# Patient Record
Sex: Male | Born: 1970 | Race: White | Hispanic: No | Smoking: Current every day smoker
Health system: Southern US, Community
[De-identification: ages and names within clinical notes are randomized; demographics above are authoritative.]

## PROBLEM LIST (undated history)

## (undated) DIAGNOSIS — G629 Polyneuropathy, unspecified: Secondary | ICD-10-CM

## (undated) DIAGNOSIS — I471 Supraventricular tachycardia: Secondary | ICD-10-CM

## (undated) DIAGNOSIS — R569 Unspecified convulsions: Secondary | ICD-10-CM

## (undated) DIAGNOSIS — E119 Type 2 diabetes mellitus without complications: Secondary | ICD-10-CM

## (undated) DIAGNOSIS — E7800 Pure hypercholesterolemia, unspecified: Secondary | ICD-10-CM

## (undated) DIAGNOSIS — J45909 Unspecified asthma, uncomplicated: Secondary | ICD-10-CM

## (undated) DIAGNOSIS — F329 Major depressive disorder, single episode, unspecified: Secondary | ICD-10-CM

## (undated) DIAGNOSIS — H547 Unspecified visual loss: Secondary | ICD-10-CM

## (undated) DIAGNOSIS — I639 Cerebral infarction, unspecified: Secondary | ICD-10-CM

## (undated) DIAGNOSIS — F419 Anxiety disorder, unspecified: Secondary | ICD-10-CM

## (undated) DIAGNOSIS — F32A Depression, unspecified: Secondary | ICD-10-CM

## (undated) DIAGNOSIS — H5462 Unqualified visual loss, left eye, normal vision right eye: Secondary | ICD-10-CM

## (undated) HISTORY — PX: EYE SURGERY: SHX253

## (undated) HISTORY — PX: ABDOMINAL SURGERY: SHX537

## (undated) HISTORY — PX: HERNIA REPAIR: SHX51

---

## 2004-11-26 ENCOUNTER — Ambulatory Visit: Payer: Self-pay | Admitting: Family Medicine

## 2014-01-23 ENCOUNTER — Ambulatory Visit: Payer: Self-pay | Admitting: Ophthalmology

## 2014-01-25 ENCOUNTER — Encounter (HOSPITAL_COMMUNITY): Payer: Self-pay | Admitting: Emergency Medicine

## 2014-01-25 ENCOUNTER — Emergency Department (HOSPITAL_COMMUNITY): Payer: Medicaid Other

## 2014-01-25 ENCOUNTER — Observation Stay (HOSPITAL_COMMUNITY): Payer: Medicaid Other

## 2014-01-25 ENCOUNTER — Observation Stay (HOSPITAL_COMMUNITY)
Admission: EM | Admit: 2014-01-25 | Discharge: 2014-01-27 | Disposition: A | Discharge status: Home / Self Care | Payer: Medicaid Other | Attending: Dermatology | Admitting: Dermatology

## 2014-01-25 DIAGNOSIS — S4980XA Other specified injuries of shoulder and upper arm, unspecified arm, initial encounter: Secondary | ICD-10-CM | POA: Diagnosis not present

## 2014-01-25 DIAGNOSIS — S066X9A Traumatic subarachnoid hemorrhage with loss of consciousness of unspecified duration, initial encounter: Secondary | ICD-10-CM | POA: Diagnosis not present

## 2014-01-25 DIAGNOSIS — F411 Generalized anxiety disorder: Secondary | ICD-10-CM | POA: Insufficient documentation

## 2014-01-25 DIAGNOSIS — Z880 Allergy status to penicillin: Secondary | ICD-10-CM | POA: Diagnosis not present

## 2014-01-25 DIAGNOSIS — S066XAA Traumatic subarachnoid hemorrhage with loss of consciousness status unknown, initial encounter: Secondary | ICD-10-CM | POA: Diagnosis not present

## 2014-01-25 DIAGNOSIS — E7800 Pure hypercholesterolemia, unspecified: Secondary | ICD-10-CM | POA: Diagnosis not present

## 2014-01-25 DIAGNOSIS — S46909A Unspecified injury of unspecified muscle, fascia and tendon at shoulder and upper arm level, unspecified arm, initial encounter: Secondary | ICD-10-CM | POA: Insufficient documentation

## 2014-01-25 DIAGNOSIS — G589 Mononeuropathy, unspecified: Secondary | ICD-10-CM | POA: Insufficient documentation

## 2014-01-25 DIAGNOSIS — R55 Syncope and collapse: Secondary | ICD-10-CM | POA: Diagnosis not present

## 2014-01-25 DIAGNOSIS — Z23 Encounter for immunization: Secondary | ICD-10-CM | POA: Insufficient documentation

## 2014-01-25 DIAGNOSIS — F3289 Other specified depressive episodes: Secondary | ICD-10-CM | POA: Insufficient documentation

## 2014-01-25 DIAGNOSIS — S79919A Unspecified injury of unspecified hip, initial encounter: Principal | ICD-10-CM | POA: Insufficient documentation

## 2014-01-25 DIAGNOSIS — Y939 Activity, unspecified: Secondary | ICD-10-CM | POA: Insufficient documentation

## 2014-01-25 DIAGNOSIS — H547 Unspecified visual loss: Secondary | ICD-10-CM | POA: Insufficient documentation

## 2014-01-25 DIAGNOSIS — E119 Type 2 diabetes mellitus without complications: Secondary | ICD-10-CM | POA: Insufficient documentation

## 2014-01-25 DIAGNOSIS — E1165 Type 2 diabetes mellitus with hyperglycemia: Secondary | ICD-10-CM

## 2014-01-25 DIAGNOSIS — M25551 Pain in right hip: Secondary | ICD-10-CM

## 2014-01-25 DIAGNOSIS — S79929A Unspecified injury of unspecified thigh, initial encounter: Principal | ICD-10-CM

## 2014-01-25 DIAGNOSIS — Z79899 Other long term (current) drug therapy: Secondary | ICD-10-CM | POA: Diagnosis not present

## 2014-01-25 DIAGNOSIS — J45909 Unspecified asthma, uncomplicated: Secondary | ICD-10-CM | POA: Insufficient documentation

## 2014-01-25 DIAGNOSIS — Y92009 Unspecified place in unspecified non-institutional (private) residence as the place of occurrence of the external cause: Secondary | ICD-10-CM | POA: Diagnosis not present

## 2014-01-25 DIAGNOSIS — H53139 Sudden visual loss, unspecified eye: Secondary | ICD-10-CM

## 2014-01-25 DIAGNOSIS — S199XXA Unspecified injury of neck, initial encounter: Secondary | ICD-10-CM

## 2014-01-25 DIAGNOSIS — F17200 Nicotine dependence, unspecified, uncomplicated: Secondary | ICD-10-CM | POA: Insufficient documentation

## 2014-01-25 DIAGNOSIS — R296 Repeated falls: Secondary | ICD-10-CM | POA: Diagnosis not present

## 2014-01-25 DIAGNOSIS — S0993XA Unspecified injury of face, initial encounter: Secondary | ICD-10-CM | POA: Insufficient documentation

## 2014-01-25 DIAGNOSIS — IMO0001 Reserved for inherently not codable concepts without codable children: Secondary | ICD-10-CM | POA: Diagnosis present

## 2014-01-25 DIAGNOSIS — F329 Major depressive disorder, single episode, unspecified: Secondary | ICD-10-CM | POA: Insufficient documentation

## 2014-01-25 DIAGNOSIS — I609 Nontraumatic subarachnoid hemorrhage, unspecified: Secondary | ICD-10-CM | POA: Diagnosis present

## 2014-01-25 DIAGNOSIS — M25559 Pain in unspecified hip: Secondary | ICD-10-CM | POA: Diagnosis present

## 2014-01-25 HISTORY — DX: Major depressive disorder, single episode, unspecified: F32.9

## 2014-01-25 HISTORY — DX: Depression, unspecified: F32.A

## 2014-01-25 HISTORY — DX: Unspecified asthma, uncomplicated: J45.909

## 2014-01-25 HISTORY — DX: Type 2 diabetes mellitus without complications: E11.9

## 2014-01-25 HISTORY — DX: Polyneuropathy, unspecified: G62.9

## 2014-01-25 HISTORY — DX: Unqualified visual loss, left eye, normal vision right eye: H54.62

## 2014-01-25 HISTORY — DX: Pure hypercholesterolemia, unspecified: E78.00

## 2014-01-25 HISTORY — DX: Anxiety disorder, unspecified: F41.9

## 2014-01-25 LAB — CBC WITH DIFFERENTIAL/PLATELET
Basophils Absolute: 0 10*3/uL (ref 0.0–0.1)
Basophils Relative: 0 % (ref 0–1)
Eosinophils Absolute: 0.3 10*3/uL (ref 0.0–0.7)
Eosinophils Relative: 4 % (ref 0–5)
HEMATOCRIT: 44.5 % (ref 39.0–52.0)
HEMOGLOBIN: 15.7 g/dL (ref 13.0–17.0)
LYMPHS ABS: 1.7 10*3/uL (ref 0.7–4.0)
LYMPHS PCT: 25 % (ref 12–46)
MCH: 31.8 pg (ref 26.0–34.0)
MCHC: 35.3 g/dL (ref 30.0–36.0)
MCV: 90.1 fL (ref 78.0–100.0)
MONOS PCT: 6 % (ref 3–12)
Monocytes Absolute: 0.4 10*3/uL (ref 0.1–1.0)
NEUTROS ABS: 4.4 10*3/uL (ref 1.7–7.7)
Neutrophils Relative %: 65 % (ref 43–77)
Platelets: 122 10*3/uL — ABNORMAL LOW (ref 150–400)
RBC: 4.94 MIL/uL (ref 4.22–5.81)
RDW: 13.1 % (ref 11.5–15.5)
WBC: 6.8 10*3/uL (ref 4.0–10.5)

## 2014-01-25 LAB — I-STAT TROPONIN, ED: Troponin i, poc: 0 ng/mL (ref 0.00–0.08)

## 2014-01-25 LAB — BASIC METABOLIC PANEL
ANION GAP: 16 — AB (ref 5–15)
BUN: 5 mg/dL — AB (ref 6–23)
CALCIUM: 9 mg/dL (ref 8.4–10.5)
CHLORIDE: 101 meq/L (ref 96–112)
CO2: 24 meq/L (ref 19–32)
Creatinine, Ser: 0.51 mg/dL (ref 0.50–1.35)
GFR calc Af Amer: >90 mL/min (ref >90)
GFR calc non Af Amer: >90 mL/min (ref >90)
GLUCOSE: 164 mg/dL — AB (ref 70–99)
Potassium: 4.2 mEq/L (ref 3.7–5.3)
Sodium: 141 mEq/L (ref 137–147)

## 2014-01-25 LAB — GLUCOSE, CAPILLARY: Glucose-Capillary: 168 mg/dL — ABNORMAL HIGH (ref 70–99)

## 2014-01-25 MED ORDER — ACETAMINOPHEN 325 MG PO TABS: 650.0000 mg | ORAL_TABLET | Freq: Four times a day (QID) | ORAL | Status: DC | PRN

## 2014-01-25 MED ORDER — LISINOPRIL 2.5 MG PO TABS
2.5000 mg | ORAL_TABLET | Freq: Every day | ORAL | Status: DC
  Administered 2014-01-25 – 2014-01-27 (×3): 2.5 mg via ORAL
  Filled 2014-01-25 (×3): qty 1

## 2014-01-25 MED ORDER — FLUOXETINE HCL 20 MG PO CAPS
20.0000 mg | ORAL_CAPSULE | Freq: Every day | ORAL | Status: DC
  Administered 2014-01-25 – 2014-01-27 (×3): 20 mg via ORAL
  Filled 2014-01-25 (×3): qty 1

## 2014-01-25 MED ORDER — ATORVASTATIN CALCIUM 40 MG PO TABS
40.0000 mg | ORAL_TABLET | Freq: Every day | ORAL | Status: DC
  Administered 2014-01-25 – 2014-01-27 (×3): 40 mg via ORAL
  Filled 2014-01-25 (×3): qty 1

## 2014-01-25 MED ORDER — SODIUM CHLORIDE 0.9 % IJ SOLN
3.0000 mL | Freq: Two times a day (BID) | INTRAMUSCULAR | Status: DC
  Administered 2014-01-25: 3 mL via INTRAVENOUS

## 2014-01-25 MED ORDER — SODIUM CHLORIDE 0.9 % IJ SOLN: 3.0000 mL | INTRAMUSCULAR | Status: DC | PRN

## 2014-01-25 MED ORDER — MORPHINE SULFATE 2 MG/ML IJ SOLN
INTRAMUSCULAR | Status: AC
  Filled 2014-01-25: qty 1

## 2014-01-25 MED ORDER — ONDANSETRON HCL 4 MG/2ML IJ SOLN: 4.0000 mg | Freq: Four times a day (QID) | INTRAMUSCULAR | Status: DC | PRN

## 2014-01-25 MED ORDER — MORPHINE SULFATE 4 MG/ML IJ SOLN
4.0000 mg | Freq: Once | INTRAMUSCULAR | Status: AC
  Administered 2014-01-25: 4 mg via INTRAVENOUS
  Filled 2014-01-25: qty 1

## 2014-01-25 MED ORDER — INSULIN ASPART 100 UNIT/ML ~~LOC~~ SOLN
0.0000 [IU] | Freq: Three times a day (TID) | SUBCUTANEOUS | Status: DC
  Administered 2014-01-26: 1 [IU] via SUBCUTANEOUS
  Administered 2014-01-26 – 2014-01-27 (×3): 2 [IU] via SUBCUTANEOUS

## 2014-01-25 MED ORDER — SODIUM CHLORIDE 0.9 % IV SOLN: 250.0000 mL | INTRAVENOUS | Status: DC | PRN

## 2014-01-25 MED ORDER — ACETAMINOPHEN 650 MG RE SUPP: 650.0000 mg | Freq: Four times a day (QID) | RECTAL | Status: DC | PRN

## 2014-01-25 MED ORDER — HEPARIN SODIUM (PORCINE) 5000 UNIT/ML IJ SOLN: 5000.0000 [IU] | Freq: Three times a day (TID) | INTRAMUSCULAR | Status: DC

## 2014-01-25 MED ORDER — PREGABALIN 75 MG PO CAPS
150.0000 mg | ORAL_CAPSULE | Freq: Two times a day (BID) | ORAL | Status: DC
  Administered 2014-01-25 – 2014-01-27 (×4): 150 mg via ORAL
  Filled 2014-01-25 (×4): qty 2

## 2014-01-25 MED ORDER — PNEUMOCOCCAL VAC POLYVALENT 25 MCG/0.5ML IJ INJ
0.5000 mL | INJECTION | INTRAMUSCULAR | Status: AC
  Administered 2014-01-26: 0.5 mL via INTRAMUSCULAR
  Filled 2014-01-25: qty 0.5

## 2014-01-25 MED ORDER — MORPHINE SULFATE 2 MG/ML IJ SOLN
2.0000 mg | Freq: Once | INTRAMUSCULAR | Status: AC
  Administered 2014-01-25: 2 mg via INTRAVENOUS

## 2014-01-25 MED ORDER — ONDANSETRON HCL 4 MG PO TABS: 4.0000 mg | ORAL_TABLET | Freq: Four times a day (QID) | ORAL | Status: DC | PRN

## 2014-01-25 NOTE — ED Provider Notes (Signed)
Medical screening examination/treatment/procedure(s) were conducted as a shared visit with non-physician practitioner(s) and myself.  I personally evaluated the patient during the encounter.   EKG Interpretation   Date/Time:  Friday January 25 2014 15:55:04 EDT Ventricular Rate:  83 PR Interval:  119 QRS Duration: 83 QT Interval:  374 QTC Calculation: 439 R Axis:   33 Text Interpretation:  Sinus rhythm Borderline short PR interval Borderline  T abnormalities, inferior leads No previous tracing Confirmed by Denton LankSTEINL   MD, Caryn BeeKEVIN ([id number]) on 01/25/2014 4:05:46 PM      Pt w syncopal episode at home today.  Also states change in vision/loos of vision in eye for the past few days, constant, no change day - states was seen at outside facility for same earlier in week. Monitor. Labs. Imaging. Spine nt.   Suzi RootsKevin E Gaylene Moylan, MD 01/25/14 2052

## 2014-01-25 NOTE — H&P (Signed)
Family Medicine Teaching [facility] Admission History and Physical Service Pager: [phone]  Patient name: Miguel Diaz. Medical record number: [id number] Date of birth: [birth year] Age: 43 y.o. Gender: male  Primary Care Provider: PROVIDER NOT IN SYSTEM Consultants: None Code Status: Full (states would not want to be on ventilator/intubated more than 1 day)  Chief Complaint: Fall   Assessment and Plan: Miguel Diaz. is a 43 y.o. male presenting with a syncope like episode . PMH is significant for diabetes, peripheral neuropathy, asthma, anxiety, depression, vision loss (left), and hypercholesterolemia  Syncope: CT concerning for a small cerebellar hemorrhage vs subarachnoid hemorrhage. No masses or evidence of ICP noted.  No new focal deficits noted on exam. Patient also having acute right vision loss in past week, it is in the process of being worked up as an outpatient with ophthalmology, with [name] done 7/22 and planned f/u early next week.  No new medications recently.. The patient denies any chest pain or SOB and doesn't really have a cardiac history. The lack of prodromal symptoms is concerning for an arrhthymia, though EKG showed only some nonspecific TWI inferior leads. Patient had just walked out of the bathroom after urinating, possible vaso-vagal syncope, though this would be delayed. ED workup largely negative, with normal CBC, Bmet, i-stat troponin - Admit to telemetry, attending Dr. Gwendolyn Grant - Spoke to Dr. Amada Jupiter, neurology, concerning CT findings. He reviewed the CT and [name] from 2 days ago with radiology. Recommended obtaining a non-contrast [name] to evaluate for hemorrhage: if hemorrhage present call him so he may see the patient, also need to consider neurosurgery consult. - [name] brain pending - Attempt to obtain records from the ophthalmologist: if there was an retinal artery occlusion then consult Dr. Amada Jupiter. Given it is the weekend this may not be  possible. - Repeat EKG, Bmet, CBC in the AM - F/u orthostatic vitals - No evidence of fractures on imaging. Tylenol PRN for pain.  Diabetes mellitus, type 2: No hemoglobin A1c on file.   - Will hold home metformin and glipizide - F/u Hb A1c tomorrow - Sensitive SSI with CBGs  - Lyrica for neuropathy - continue low dose lisinopril for renal protection  Hypercholesterolemia: No lipid panel on file - Continue home statin  Anxiety/Depression: Stable - Continue home prozac   FEN/GI: Carb-modified/ NS IV lock Prophylaxis: SCDs, no pharmacologics in case pt has acute hemorrhage   Disposition: Admit to telemetry. Dispo pending [name] results  History of Present Illness: Miguel Diaz. is a 43 y.o. male presenting with a syncope episode   Was at home today and fell while in the bathroom after urinating. He does not remember exactly what happened to cause him to fall, he thinks he remembers trying to catch himself but hit the wall or floor after trying to leave the bathroom. He does not remember tripping on anything. He lost consciousness for an unknown amount of time. He fell on his right side, hurting his right hip and neck (rates as 5/10). Denies hitting his head but difficult to remember. When he woke up he called his girlfriend on the phone (girlfriend states at 1:45pm) for help who called his nephew.  When nephew arrived in the bathroom approximately 10 minutes later he appeared startled. He denies loss of bladder/bowel, CP, SOB, headache.  No prodromal symptoms such as dizziness, light-headedness, or diaphoresis.  Oddly, he reports losing his right vision Saturday/Sunday. This was a sudden onset while at work. This is in  the process of being worked up by PCP with an [name] 2 days ago 7/22, and also followed by Opthalmology with recent visits this past week and follow up to discuss results early next week.  In the right eye he can only see "real faint gray shadows". He lost vision in his left  eye in 2013 thought to be due to hyperglycemia or diabetes complications, but patient and girlfriend are not exactly sure. He only takes PO diabetic medications currently b/c he cannot afford insulins- his PCP at [facility] is helping him to get insurance.   Review Of Systems:  Otherwise 12 point review of systems was performed and was unremarkable.  Patient Active Problem List   Diagnosis Date Noted  . Syncope 01/25/2014  . Hip pain 01/25/2014  . SAH (subarachnoid hemorrhage) 01/25/2014  . Vision, loss, sudden 01/25/2014  . Type II or unspecified type diabetes mellitus without mention of complication, uncontrolled 01/25/2014   Past Medical History: Past Medical History  Diagnosis Date  . Diabetes mellitus without complication   . Neuropathy   . Asthma   . Hypercholesteremia   . Vision loss of left eye   . Depression   . Anxiety    Past Surgical History: Past Surgical History  Procedure Laterality Date  . Hernia repair    . Eye surgery     Social History: History  Substance Use Topics  . Smoking status: Current Every Day Smoker -- 1.00 packs/day    Types: Cigarettes  . Smokeless tobacco: Not on file  . Alcohol Use: Yes     Comment: occ  Last alcohol use in May  Additional social history: lives at home with girlfriend and 19yo nephew  Please also refer to relevant sections of EMR.  Family History: History reviewed. No pertinent family history. Allergies and Medications: Allergies  Allergen Reactions  . Penicillins Other (See Comments)    Childhood allergy  . Toradol [Ketorolac Tromethamine] Hives   No current facility-administered medications on file prior to encounter.   No current outpatient prescriptions on file prior to encounter.    Objective: BP 142/95  Pulse 81  Temp(Src) 98.8 F (37.1 C) (Oral)  Resp 12  Ht 5\' 8"  (1.727 m)  Wt 190 lb (86.183 kg)  BMI 28.90 kg/m2  SpO2 97% Exam: General: Uncomfortable 43 y/o male lying supine  in bed lying on an open aspen cervical collar HEENT: Head atruamatic, normocephalic. PERRL, EOMI. Oropharynx clear, poor dentition. Dry mucous membranes. Tender to palpation all of c-spine, pt unwilling to move head to evaluate ROM Cardiovascular: Regular rate and rhythm, normal heart sounds, no murmur appreciated. 2+ radial and DP pulses bilaterally. Respiratory: CTAB over the anterior chest wall.  Abdomen: +BS, soft, ND/NT Extremities: No gross deformities. Right shoulder, and hip tender to palpation without ecchymosis. 5/5 hand grip b/l.  4+/5 hip flexor on the left, 4/5 on the right.  Skin: No rashes noted Neuro: Speech clear and coherent. A&O x4. Symmetric facial movements with smile and brow movement. Sensation intact and symmetric over the face.   Labs and Imaging: CBC BMET   Recent Labs Lab 01/25/14 1640  WBC 6.8  HGB 15.7  HCT 44.5  PLT 122*    Recent Labs Lab 01/25/14 1640  NA 141  K 4.2  CL 101  CO2 24  BUN 5*  CREATININE 0.51  GLUCOSE 164*  CALCIUM 9.0     CT cervical spine:  1. Punctate 3 mm focus of increased is seen  in the right posterior  fossa. This could be a tiny subarachnoid hemorrhage or right  cerebellar hemorrhage.  2. Right frontal sinusitis. Multiple mucous retention cyst noted  throughout the paranasal sinuses.  3. No evidence of cervical spine fracture or dislocation.  CT Head: 1. Punctate 3 mm focus of increased is seen in the right posterior  fossa. This could be a tiny subarachnoid hemorrhage or right  cerebellar hemorrhage.  2. Right frontal sinusitis. Multiple mucous retention cyst noted  throughout the paranasal sinuses.  3. No evidence of cervical spine fracture or dislocation.  CXR: Mild chronic bronchitic type lung changes but no acute pulmonary findings.  Right hip x-ray: No acute bony findings.  Right shoulder x-ray: No fracture or dislocation  EKG: sinus rhythm, TWI noted in III and aVF; no prior to compare to  Joanna Puffrystal S  Dorsey, MD 01/25/2014, 6:09 PM PGY-1, [facility] FPTS Intern pager: [phone], text pages welcome  I have reviewed the note above and agree. My additions are highlighted in blue.  Tawni CarnesAndrew Maily Debarge, MD 01/25/2014, 9:15 PM PGY-2, [facility] FPTS Intern Pager: [phone], text pages welcome

## 2014-01-25 NOTE — ED Notes (Signed)
[facility] for fall. Pt states the last thing he remembered he was in the bathroom and the next thing he knew he was laying on the hallway floor.  He called family with cell phone in his pocket.  His nephew picked him up and sat pt on couch.  Sat night pt lost vision in R eye (Hx of same in Jan with resolution of vision).  RN stated pt should wait till Monday to see MD.  Pt rcvd [name] on Wed and family has follow up appt on Wed.  Pt ao x 4. Pt c/o pain to entire R side.  CBG in 200's per EMS.

## 2014-01-25 NOTE — ED Notes (Signed)
Pt here for fall, was uses a cane at home and unsure of what caused him to fall, pt on Saturday lost vision in right eye and has hx of no vision in left eye since 2013, pt complains of neck and back pain.

## 2014-01-25 NOTE — ED Provider Notes (Signed)
CSN: [id number]     Arrival date & time 01/25/14  1517 History   First MD Initiated Contact with Patient 01/25/14 1544     Chief Complaint  Patient presents with  . Fall     (Consider location/radiation/quality/duration/timing/severity/associated sxs/prior Treatment) HPI Comments:  Patient presents to the emergency department on a backboard and c-collar with chief complaint of fall/syncopal episode. Reportedly, the patient passed out or fell in his bathroom. He does not know the cause of the fall. States that when he awoke, he had a cell phone in his pocket a common cause family members, who set him up from a couch on call EMS. Patient is complaining of pain in his right hip. He is also complaining of pain in his right shoulder and side. He denies any neck pain, or back pain. He is able to move all extremities.  Additionally, patient informs me that he has intermittent visual loss. Complains of losing vision in his right eye on Saturday. He has been seen by his primary care doctor, as well as ophthalmology, and has had a recent normal [name]. Patient has a followup appointment on Wednesday. Patient has had problems like this before, and his vision is always return. Additionally, he complains of diabetic neuropathy. He denies any other motor or sensory deficits.  The history is provided by the patient. No language interpreter was used.    Past Medical History  Diagnosis Date  . Diabetes mellitus without complication   . Neuropathy   . Asthma   . Hypercholesteremia   . Vision loss of left eye   . Depression   . Anxiety    Past Surgical History  Procedure Laterality Date  . Hernia repair    . Eye surgery     History reviewed. No pertinent family history. History  Substance Use Topics  . Smoking status: Current Every Day Smoker -- 1.00 packs/day    Types: Cigarettes  . Smokeless tobacco: Not on file  . Alcohol Use: Yes     Comment: occ    Review of Systems  All other systems  reviewed and are negative.     Allergies  Penicillins and Toradol  Home Medications   Prior to Admission medications   Medication Sig Start Date End Date Taking? Authorizing Provider  atorvastatin (LIPITOR) 40 MG tablet Take 40 mg by mouth daily.   Yes Historical Provider, MD  FLUoxetine (PROZAC) 20 MG capsule Take 20 mg by mouth daily.   Yes Historical Provider, MD  glipiZIDE (GLUCOTROL XL) 10 MG 24 hr tablet Take 10 mg by mouth daily with breakfast.   Yes Historical Provider, MD  ibuprofen (ADVIL,MOTRIN) 600 MG tablet Take 600 mg by mouth every 8 (eight) hours as needed (pain).   Yes Historical Provider, MD  lisinopril (PRINIVIL,ZESTRIL) 2.5 MG tablet Take 2.5 mg by mouth daily.   Yes Historical Provider, MD  metFORMIN (GLUMETZA) 500 MG (MOD) 24 hr tablet Take 1,000 mg by mouth 2 (two) times daily.   Yes Historical Provider, MD  pregabalin (LYRICA) 150 MG capsule Take 150 mg by mouth 2 (two) times daily.   Yes Historical Provider, MD   BP 142/95  Pulse 81  Temp(Src) 98.8 F (37.1 C) (Oral)  Resp 12  Ht 5\' 8"  (1.727 m)  Wt 190 lb (86.183 kg)  BMI 28.90 kg/m2  SpO2 97% Physical Exam  Nursing note and vitals reviewed. Constitutional: He is oriented to person, place, and time. He appears well-developed and well-nourished.  HENT:  Head:  Normocephalic and atraumatic.  No obvious sign of head injury, no hematoma, no raccoon's eyes, no battle sign  Oropharynx is clear, poor dentition throughout  Eyes: Conjunctivae and EOM are normal. Pupils are equal, round, and reactive to light. Right eye exhibits no discharge. Left eye exhibits no discharge. No scleral icterus.  Neck: Normal range of motion. Neck supple. No JVD present.  Cardiovascular: Normal rate, regular rhythm and normal heart sounds.  Exam reveals no gallop and no friction rub.   No murmur heard. Pulmonary/Chest: Effort normal and breath sounds normal. No respiratory distress. He has no wheezes. He has no rales. He exhibits  no tenderness.  Abdominal: Soft. He exhibits no distension and no mass. There is no tenderness. There is no rebound and no guarding.  No focal abdominal tenderness, no RLQ tenderness or pain at McBurney's point, no RUQ tenderness or Murphy's sign, no left-sided abdominal tenderness, no fluid wave, or signs of peritonitis   Musculoskeletal: Normal range of motion. He exhibits no edema and no tenderness.  Right shoulder tender to palpation, range of motion and strength reduced secondary to pain, no bony abnormality or deformity  Right hip tender to palpation, range of motion and strength reduced secondary to pain, no bony abnormality or deformity  Otherwise, moves all extremities throughout range of motion, no tenderness to palpation of the CTLS spine, no step-offs, no deformities  Neurological: He is alert and oriented to person, place, and time.  Skin: Skin is warm and dry.  Psychiatric: He has a normal mood and affect. His behavior is normal. Judgment and thought content normal.    ED Course  Procedures (including critical care time) Results for orders placed during the hospital encounter of 01/25/14  CBC WITH DIFFERENTIAL      Result Value Ref Range   WBC 6.8  4.0 - 10.5 K/uL   RBC 4.94  4.22 - 5.81 MIL/uL   Hemoglobin 15.7  13.0 - 17.0 g/dL   HCT 16.1  09.6 - 04.5 %   MCV 90.1  78.0 - 100.0 fL   MCH 31.8  26.0 - 34.0 pg   MCHC 35.3  30.0 - 36.0 g/dL   RDW 40.9  81.1 - 91.4 %   Platelets 122 (*) 150 - 400 K/uL   Neutrophils Relative % 65  43 - 77 %   Neutro Abs 4.4  1.7 - 7.7 K/uL   Lymphocytes Relative 25  12 - 46 %   Lymphs Abs 1.7  0.7 - 4.0 K/uL   Monocytes Relative 6  3 - 12 %   Monocytes Absolute 0.4  0.1 - 1.0 K/uL   Eosinophils Relative 4  0 - 5 %   Eosinophils Absolute 0.3  0.0 - 0.7 K/uL   Basophils Relative 0  0 - 1 %   Basophils Absolute 0.0  0.0 - 0.1 K/uL  BASIC METABOLIC PANEL      Result Value Ref Range   Sodium 141  137 - 147 mEq/L   Potassium 4.2  3.7 -  5.3 mEq/L   Chloride 101  96 - 112 mEq/L   CO2 24  19 - 32 mEq/L   Glucose, Bld 164 (*) 70 - 99 mg/dL   BUN 5 (*) 6 - 23 mg/dL   Creatinine, Ser 7.82  0.50 - 1.35 mg/dL   Calcium 9.0  8.4 - 95.6 mg/dL   GFR calc non Af Amer >90  >90 mL/min   GFR calc Af Amer >90  >90 mL/min  Anion gap 16 (*) 5 - 15  I-STAT TROPOININ, ED      Result Value Ref Range   Troponin i, poc 0.00  0.00 - 0.08 ng/mL   Comment 3            Dg Chest 2 View  01/25/2014   CLINICAL DATA:  Larey SeatFell.  Right hip pain.  EXAM: CHEST  2 VIEW  COMPARISON:  12/21/2012.  FINDINGS: The cardiac silhouette, mediastinal and hilar contours are within normal limits and stable. There is mild tortuosity of the thoracic aorta. The lungs demonstrate mild chronic bronchitic changes but no acute overlying pulmonary process. No pleural effusion. The bony thorax is intact. The thoracic vertebral bodies are normally aligned. No acute fracture. The sternum is intact.  IMPRESSION: Mild chronic bronchitic type lung changes but no acute pulmonary findings.  Intact bony thorax.   Electronically Signed   By: Loralie ChampagneMark  Gallerani M.D.   On: 01/25/2014 17:45   Dg Shoulder Right  01/25/2014   CLINICAL DATA:  Larey SeatFell.  Right shoulder pain.  EXAM: RIGHT SHOULDER - 2+ VIEW  COMPARISON:  None.  FINDINGS: The joint spaces are maintained. No acute fracture. No abnormal soft tissue calcifications. The visualized right lung is clear.  IMPRESSION: No fracture or dislocation.   Electronically Signed   By: Loralie ChampagneMark  Gallerani M.D.   On: 01/25/2014 17:45   Dg Hip Complete Right  01/25/2014   CLINICAL DATA:  Larey SeatFell.  Right hip pain.  EXAM: RIGHT HIP - COMPLETE 2+ VIEW  COMPARISON:  None.  FINDINGS: The hips are normally located. Minimal degenerative changes. No acute fracture or plain film evidence of avascular necrosis. The pubic symphysis and SI joints are intact. Mild SI joint degenerative changes.  IMPRESSION: No acute bony findings.   Electronically Signed   By: Loralie ChampagneMark  Gallerani M.D.    On: 01/25/2014 17:43   Ct Head Wo Contrast  01/25/2014   CLINICAL DATA:  Fall.  EXAM: CT HEAD WITHOUT CONTRAST  CT CERVICAL SPINE WITHOUT CONTRAST  TECHNIQUE: Multidetector CT imaging of the head and cervical spine was performed following the standard protocol without intravenous contrast. Multiplanar CT image reconstructions of the cervical spine were also generated.  COMPARISON:  [name] 01/23/2014.  CT 03/24/2012.  FINDINGS: CT HEAD FINDINGS  No mass. No hydrocephalus. No hemorrhage. Punctate 3 mm focus of increased density noted in the right posterior fossa. This could be in in the subarachnoid space or the right cerebellar hemisphere. No mass effect. No other foci of hemorrhage noted. No acute bony abnormality identified. Ill-defined fluid collection noted in the right frontal sinus suggesting sinusitis. Multiple mucous retention cysts noted throughout the paranasal sinuses. Tiny left occipital scalp lipoma noted.  CT CERVICAL SPINE FINDINGS  Shotty cervical lymph nodes are noted. Cervical airway is widely patent. Thyroid is unremarkable. Apical pleural parenchymal thickening present most consistent with scarring. No acute fracture or dislocation noted.  IMPRESSION: 1. Punctate 3 mm focus of increased is seen in the right posterior fossa. This could be a tiny subarachnoid hemorrhage or right cerebellar hemorrhage. 2. Right frontal sinusitis. Multiple mucous retention cyst noted throughout the paranasal sinuses. 3. No evidence of cervical spine fracture or dislocation. Critical Value/emergent results were called by telephone at the time of interpretation on 01/25/2014 at 5:19 pm to PA [facility] Makenize Messman , who verbally acknowledged these results.   Electronically Signed   By: Maisie Fushomas  Register   On: 01/25/2014 17:21   Ct Cervical Spine Wo Contrast  01/25/2014  CLINICAL DATA:  Fall.  EXAM: CT HEAD WITHOUT CONTRAST  CT CERVICAL SPINE WITHOUT CONTRAST  TECHNIQUE: Multidetector CT imaging of the head and cervical  spine was performed following the standard protocol without intravenous contrast. Multiplanar CT image reconstructions of the cervical spine were also generated.  COMPARISON:  [name] 01/23/2014.  CT 03/24/2012.  FINDINGS: CT HEAD FINDINGS  No mass. No hydrocephalus. No hemorrhage. Punctate 3 mm focus of increased density noted in the right posterior fossa. This could be in in the subarachnoid space or the right cerebellar hemisphere. No mass effect. No other foci of hemorrhage noted. No acute bony abnormality identified. Ill-defined fluid collection noted in the right frontal sinus suggesting sinusitis. Multiple mucous retention cysts noted throughout the paranasal sinuses. Tiny left occipital scalp lipoma noted.  CT CERVICAL SPINE FINDINGS  Shotty cervical lymph nodes are noted. Cervical airway is widely patent. Thyroid is unremarkable. Apical pleural parenchymal thickening present most consistent with scarring. No acute fracture or dislocation noted.  IMPRESSION: 1. Punctate 3 mm focus of increased is seen in the right posterior fossa. This could be a tiny subarachnoid hemorrhage or right cerebellar hemorrhage. 2. Right frontal sinusitis. Multiple mucous retention cyst noted throughout the paranasal sinuses. 3. No evidence of cervical spine fracture or dislocation. Critical Value/emergent results were called by telephone at the time of interpretation on 01/25/2014 at 5:19 pm to PA [facility] , who verbally acknowledged these results.   Electronically Signed   By: Maisie Fus  Register   On: 01/25/2014 17:21     Imaging Review No results found.   EKG Interpretation   Date/Time:  Friday January 25 2014 15:55:04 EDT Ventricular Rate:  83 PR Interval:  119 QRS Duration: 83 QT Interval:  374 QTC Calculation: 439 R Axis:   33 Text Interpretation:  Sinus rhythm Borderline short PR interval Borderline  T abnormalities, inferior leads No previous tracing Confirmed by Denton Lank   MD, Caryn Bee ([id number]) on 01/25/2014  4:05:46 PM      MDM   Final diagnoses:  Syncope, unspecified syncope type  SAH (subarachnoid hemorrhage)  Hip pain, right    Patient with syncopal episode today and fall.  Complains of neck pain, and hip pain. Will check imaging, and labs. The patient also reports history of right eye vision loss. States that this is intermittent, but currently does not have vision in his right eye. He has been seen by his PCP in [place], as well as by ophthalmology, and has had a negative [name].  I was called by radiology, and inform the patient has a 3 mm punctate subarachnoid hemorrhage vs. cerebral hemorrhage.  Discussed the patient with Dr. Denton Lank.  Will admit to medicine.  SAH warrants CC.  CRITICAL CARE Performed by: Roxy Horseman   Total critical care time: 35  Critical care time was exclusive of separately billable procedures and treating other patients.  Critical care was necessary to treat or prevent imminent or life-threatening deterioration.  Critical care was time spent personally by me on the following activities: development of treatment plan with patient and/or surrogate as well as nursing, discussions with consultants, evaluation of patient's response to treatment, examination of patient, obtaining history from patient or surrogate, ordering and performing treatments and interventions, ordering and review of laboratory studies, ordering and review of radiographic studies, pulse oximetry and re-evaluation of patient's condition.   Discussed the patient with Family Medicine, who will admit this unassigned patient.  C-collar removed by me, but still complaining of midline neck pain  and pain with movement.  Will apply aspen collar, as ligamentous injury is not ruled out.  Roxy Horseman, PA-C 01/25/14 1826

## 2014-01-26 ENCOUNTER — Other Ambulatory Visit: Payer: Self-pay

## 2014-01-26 ENCOUNTER — Observation Stay (HOSPITAL_COMMUNITY): Payer: Medicaid Other

## 2014-01-26 DIAGNOSIS — S79919A Unspecified injury of unspecified hip, initial encounter: Secondary | ICD-10-CM | POA: Diagnosis not present

## 2014-01-26 DIAGNOSIS — E1165 Type 2 diabetes mellitus with hyperglycemia: Secondary | ICD-10-CM

## 2014-01-26 DIAGNOSIS — IMO0001 Reserved for inherently not codable concepts without codable children: Secondary | ICD-10-CM

## 2014-01-26 DIAGNOSIS — S46909A Unspecified injury of unspecified muscle, fascia and tendon at shoulder and upper arm level, unspecified arm, initial encounter: Secondary | ICD-10-CM | POA: Diagnosis not present

## 2014-01-26 DIAGNOSIS — S066XAA Traumatic subarachnoid hemorrhage with loss of consciousness status unknown, initial encounter: Secondary | ICD-10-CM | POA: Diagnosis not present

## 2014-01-26 DIAGNOSIS — H53139 Sudden visual loss, unspecified eye: Secondary | ICD-10-CM

## 2014-01-26 DIAGNOSIS — S066X9A Traumatic subarachnoid hemorrhage with loss of consciousness of unspecified duration, initial encounter: Secondary | ICD-10-CM | POA: Diagnosis not present

## 2014-01-26 DIAGNOSIS — R55 Syncope and collapse: Secondary | ICD-10-CM | POA: Diagnosis not present

## 2014-01-26 DIAGNOSIS — S4980XA Other specified injuries of shoulder and upper arm, unspecified arm, initial encounter: Secondary | ICD-10-CM | POA: Diagnosis not present

## 2014-01-26 LAB — GLUCOSE, CAPILLARY
GLUCOSE-CAPILLARY: 164 mg/dL — AB (ref 70–99)
Glucose-Capillary: 143 mg/dL — ABNORMAL HIGH (ref 70–99)
Glucose-Capillary: 213 mg/dL — ABNORMAL HIGH (ref 70–99)
Glucose-Capillary: 178 mg/dL — ABNORMAL HIGH (ref 70–99)

## 2014-01-26 LAB — HEMOGLOBIN A1C
Hgb A1c MFr Bld: 8.6 % — ABNORMAL HIGH (ref <5.7)
Mean Plasma Glucose: 200 mg/dL — ABNORMAL HIGH (ref <117)

## 2014-01-26 LAB — CBC
HCT: 44.2 % (ref 39.0–52.0)
Hemoglobin: 15.1 g/dL (ref 13.0–17.0)
MCH: 31.5 pg (ref 26.0–34.0)
MCHC: 34.2 g/dL (ref 30.0–36.0)
MCV: 92.3 fL (ref 78.0–100.0)
PLATELETS: DECREASED 10*3/uL (ref 150–400)
RBC: 4.79 MIL/uL (ref 4.22–5.81)
RDW: 13.3 % (ref 11.5–15.5)
WBC: 7.4 10*3/uL (ref 4.0–10.5)

## 2014-01-26 LAB — BASIC METABOLIC PANEL
Anion gap: 11 (ref 5–15)
BUN: 7 mg/dL (ref 6–23)
CALCIUM: 8.8 mg/dL (ref 8.4–10.5)
CO2: 29 mEq/L (ref 19–32)
Chloride: 102 mEq/L (ref 96–112)
Creatinine, Ser: 0.82 mg/dL (ref 0.50–1.35)
Glucose, Bld: 163 mg/dL — ABNORMAL HIGH (ref 70–99)
Potassium: 4 mEq/L (ref 3.7–5.3)
Sodium: 142 mEq/L (ref 137–147)

## 2014-01-26 LAB — TROPONIN I: Troponin I: <0.3 ng/mL (ref <0.30)

## 2014-01-26 MED ORDER — ASPIRIN EC 81 MG PO TBEC
81.0000 mg | DELAYED_RELEASE_TABLET | Freq: Every day | ORAL | Status: DC
  Administered 2014-01-26 – 2014-01-27 (×2): 81 mg via ORAL
  Filled 2014-01-26 (×2): qty 1

## 2014-01-26 NOTE — Care Management Utilization Note (Signed)
UR completed.    Zebulen Simonis Wise Lillias Difrancesco, RN, BSN Phone #[phone]  

## 2014-01-26 NOTE — Progress Notes (Signed)
  Echocardiogram 2D Echocardiogram has been performed.  Leta JunglingCooper, Kendle Erker M 01/26/2014, 2:26 PM

## 2014-01-26 NOTE — Progress Notes (Signed)
Family Medicine Teaching Service Daily Progress Note Intern Pager: [phone]  Patient name: Miguel CoeGeorge L Stiverson Jr. Medical record number: [id number] Date of birth: [birth year] Age: 43 y.o. Gender: male  Primary Care Provider: PROVIDER NOT IN SYSTEM Consultants: Neuro (phone only) Code Status: Full  Pt Overview and Major Events to Date:  7/25- no overnight events  Assessment and Plan: Miguel CoeGeorge L Hallowell Jr. is a 43 y.o. male presenting with a syncope like episode . PMH is significant for diabetes, peripheral neuropathy, asthma, anxiety, depression, vision loss (left), and hypercholesterolemia.  # Syncope: no clear etiology, ruled out SAH/intracranial bleed with [name]. Per history may be postmicturition, low suspicion for cardiac etiology with no history of heart disease/arrhythmia. Reviewed telemetry overnight and no events. Orthostatic vitals mostly normal, does not meet criteria for orthostatic hypotension. - stable for discharge  # Right vision loss: no pathology noted on [name]. Per neuro discussion, would like to see notes from ophthalmologist but office is closed - has f/u with optho on Wednesday  # Diabetes: A1c here 8.6 - SSI  FEN/GI: diet carb mod, saline lock PPx: SCDs  Disposition: discharge today  Subjective:  No events overnight, wanted to take off cervical collar this morning. Reports improved pain but still tender primarily neck and right hip.  Objective: Temp:  [97.7 F (36.5 C)-98.8 F (37.1 C)] 98.2 F (36.8 C) (07/25 0510) Pulse Rate:  [63-85] 66 (07/25 0510) Resp:  [9-18] 18 (07/24 2000) BP: (107-142)/(73-96) 107/73 mmHg (07/25 0510) SpO2:  [94 %-98 %] 98 % (07/25 0510) Weight:  [185 lb 3.2 oz (84.006 kg)-190 lb (86.183 kg)] 185 lb 3.2 oz (84.006 kg) (07/24 1939) Physical Exam: General: NAD HEENT: PERRL, EOMI. Pt states no vision present bilaterally Cardiovascular: RRR, normal heart sounds, no murmurs appreciated. 2+ radial and PT pulses bilaterally Respiratory:  CTAB, normal effort Abdomen: soft, nontender, normal bowel sounds Extremities: no edema or cyanosis Neuro: alert and oriented, CN2-12 normal, strength 5/5 grip bilat, 4/5 ankle extension bilat  Laboratory:  Recent Labs Lab 01/25/14 1640 01/26/14 0316  WBC 6.8 7.4  HGB 15.7 15.1  HCT 44.5 44.2  PLT 122* PLATELET CLUMPS NOTED ON SMEAR, COUNT APPEARS DECREASED    Recent Labs Lab 01/25/14 1640 01/26/14 0316  NA 141 142  K 4.2 4.0  CL 101 102  CO2 24 29  BUN 5* 7  CREATININE 0.51 0.82  CALCIUM 9.0 8.8  GLUCOSE 164* 163*   Imaging/Diagnostic Tests: Dg Chest 2 View 01/25/2014 IMPRESSION: Mild chronic bronchitic type lung changes but no acute pulmonary findings.  Intact bony thorax.   Electronically Signed   By: Loralie ChampagneMark  Gallerani M.D.   On: 01/25/2014 17:45   Dg Shoulder Right 01/25/2014 IMPRESSION: No fracture or dislocation.   Electronically Signed   By: Loralie ChampagneMark  Gallerani M.D.   On: 01/25/2014 17:45   Dg Hip Complete Right 01/25/2014 IMPRESSION: No acute bony findings.   Electronically Signed   By: Loralie ChampagneMark  Gallerani M.D.   On: 01/25/2014 17:43   Ct Head Wo Contrast 01/25/2014 IMPRESSION: 1. Punctate 3 mm focus of increased is seen in the right posterior fossa. This could be a tiny subarachnoid hemorrhage or right cerebellar hemorrhage. 2. Right frontal sinusitis. Multiple mucous retention cyst noted throughout the paranasal sinuses. 3. No evidence of cervical spine fracture or dislocation. Critical Value/emergent results were called by telephone at the time of interpretation on 01/25/2014 at 5:19 pm to PA [facility] , who verbally acknowledged these results.   Electronically Signed   By:  Thomas  Register   On: 01/25/2014 17:21   Ct Cervical Spine Wo Contrast 01/25/2014 IMPRESSION: 1. Punctate 3 mm focus of increased is seen in the right posterior fossa. This could be a tiny subarachnoid hemorrhage or right cerebellar hemorrhage. 2. Right frontal sinusitis. Multiple mucous  retention cyst noted throughout the paranasal sinuses. 3. No evidence of cervical spine fracture or dislocation. Critical Value/emergent results were called by telephone at the time of interpretation on 01/25/2014 at 5:19 pm to PA [facility] , who verbally acknowledged these results.   Electronically Signed   By: Maisie Fus  Register   On: 01/25/2014 17:21   Mr Brain Wo Contrast 01/25/2014 IMPRESSION: 1. No evidence of acute intracranial abnormality. The punctate hyperattenuating focus near the right foramen of Luschka on recent CT also appears to have been present on the 2013 [name], although less conspicuous on the older examination. Therefore, this most likely represents calcification, such as in choroid plexus, rather than acute hemorrhage. 2. Mild-to-moderate chronic small vessel ischemic disease.   Electronically Signed   By: Sebastian Ache   On: 01/25/2014 21:58    Tawni Carnes, MD 01/26/2014, 8:29 AM PGY-2, [facility] Family Medicine FPTS Intern pager: [phone], text pages welcome

## 2014-01-26 NOTE — Discharge Summary (Signed)
Family Medicine Teaching Service [facility] Discharge Summary  Patient name: Miguel Diaz. Medical record number: [id number] Date of birth: 1970/12/09 Age: 43 y.o. Gender: male Date of Admission: 01/25/2014  Date of Discharge: 01/27/2014 Admitting Physician: Tobey Grim, MD  Primary Care Provider: PROVIDER NOT IN SYSTEM Consultants: Cardiology, neurology (phone only)  Indication for Hospitalization: Syncope  Discharge Diagnoses/Problem List:  Post-micturition syncope Musculoskeletal injury to cervical spine, right shoulder, right hip Diabetes Mellitus, Type 2 Diabetic peripheral neuropathy Permanent left eye vision loss Acute right eye vision loss Hypercholesterolemia Asthma Anxiety, depression  Disposition: home  Discharge Condition: stable, improved  Brief Hospital Course:  [place] Jeff. was admitted for first time episode of syncope after voiding. PMH significant per above. On presentation to the ED he complained of pain in neck, right shoulder and right hip. Initial workup found a normal CBC, Bmet, A1c 8.6, plain films of shoulder and hip. EKG showed TWI in III and aVF, no prior study to compare to, and i-stat troponin was negative. A CT of c-spine showed no fracture, and CT head showed a 3mm punctate lesion in right posterior fossa concerning for subarachnoid hemorrhage or cerebellar hemorrhage. Case was discussed with neuro (phone only) and radiology, recommended [name] Brain to ruleout intracranial bleed. [name] brain showed no bleed, and punctate lesion was smaller but present on CT head done in 2013, felt this was consistent with choroid plexus calcification. He had normal orthostatic vital signs and was monitored on tele overnight with no events, repeat EKG continued to show TWI in III and aVF.  Carotid dopplers, echocardiogram, and myoview non-concerning per cardiology. His pain had improved by day of discharge.   Issues for Follow Up:  1. Right eye vision loss: has  opthalmology f/u Wednesday.  2. Diabetes: A1c 8.6, tolerated insulin while in hospital 3. Consider outpatient event monitor.  Significant Procedures: none  Significant Labs and Imaging:   Recent Labs Lab 01/25/14 1640 01/26/14 0316  WBC 6.8 7.4  HGB 15.7 15.1  HCT 44.5 44.2  PLT 122* PLATELET CLUMPS NOTED ON SMEAR, COUNT APPEARS DECREASED    Recent Labs Lab 01/25/14 1640 01/26/14 0316  NA 141 142  K 4.2 4.0  CL 101 102  CO2 24 29  GLUCOSE 164* 163*  BUN 5* 7  CREATININE 0.51 0.82  CALCIUM 9.0 8.8   Risk Stratification Labs  TSH    Component Value Date/Time   TSH 1.620 01/27/2014 0317   Hemoglobin A1C    Component Value Date/Time   HGBA1C 8.6* 01/25/2014 1640   Lipid Panel     Component Value Date/Time   CHOL 152 01/27/2014 0317   TRIG 213* 01/27/2014 0317   HDL 34* 01/27/2014 0317   CHOLHDL 4.5 01/27/2014 0317   VLDL 43* 01/27/2014 0317   LDLCALC 75 01/27/2014 0317    Dg Chest 2 View: 01/25/2014: Mild chronic bronchitic type lung changes but no acute pulmonary findings.  Intact bony thorax  Dg Shoulder Right: 01/25/2014: No fracture or dislocation.    Dg Hip Complete Right: 01/25/2014: Mild SI joint degenerative changes.No acute bony findings.     Ct Head Wo Contrast: 01/25/2014  1. Punctate 3 mm focus of increased is seen in the right posterior fossa. This could be a tiny subarachnoid hemorrhage or right cerebellar hemorrhage. 2. Right frontal sinusitis. Multiple mucous retention cyst noted throughout the paranasal sinuses. 3. No evidence of cervical spine fracture or dislocation.    Ct Cervical Spine Wo Contrast 01/25/2014: 1. Punctate  3 mm focus of increased is seen in the right posterior fossa. This could be a tiny subarachnoid hemorrhage or right cerebellar hemorrhage. 2. Right frontal sinusitis. Multiple mucous retention cyst noted throughout the paranasal sinuses. 3. No evidence of cervical spine fracture or dislocation.   [name] Brain Wo Contrast: 01/25/2014  1.  No evidence of acute intracranial abnormality. The punctate hyperattenuating focus near the right foramen of Luschka on recent CT also appears to have been present on the 2013 [name], although less conspicuous on the older examination. Therefore, this most likely represents calcification, such as in choroid plexus, rather than acute hemorrhage. 2. Mild-to-moderate chronic small vessel ischemic disease.   01/26/14 Echocardiogram: Upper normal wall thickness with LVEF 60-65%, grade 2 diastolic dysfunction. MAC with trivial mitral regurgitation. PASP 12 mmHg.  Carotid dopplers: BIlateral: mild soft plaque CCA and origin ICA. 1-39% ICA stenosis. Vertebral artery flow is antegrade.  MYOCARDIAL IMAGING WITH SPECT (REST AND PHARMACOLOGIC-STRESS): No definite inducible ischemia with pharmacologic stress. 67% ejection fraction    Results/Tests Pending at Time of Discharge: none  Discharge Medications:    Medication List         atorvastatin 40 MG tablet  Commonly known as:  LIPITOR  Take 40 mg by mouth daily.     FLUoxetine 20 MG capsule  Commonly known as:  PROZAC  Take 20 mg by mouth daily.     glipiZIDE 10 MG 24 hr tablet  Commonly known as:  GLUCOTROL XL  Take 10 mg by mouth daily with breakfast.     Heart Rate Monitor Misc  1 Units by Does not apply route as directed.     ibuprofen 600 MG tablet  Commonly known as:  ADVIL,MOTRIN  Take 600 mg by mouth every 8 (eight) hours as needed (pain).     lisinopril 2.5 MG tablet  Commonly known as:  PRINIVIL,ZESTRIL  Take 2.5 mg by mouth daily.     metFORMIN 500 MG (MOD) 24 hr tablet  Commonly known as:  GLUMETZA  Take 1,000 mg by mouth 2 (two) times daily.     pregabalin 150 MG capsule  Commonly known as:  LYRICA  Take 150 mg by mouth 2 (two) times daily.        Discharge Instructions: Please refer to Patient Instructions section of EMR for full details.  Patient was counseled important signs and symptoms that should prompt  return to medical care, changes in medications, dietary instructions, activity restrictions, and follow up appointments.   Follow-Up Appointments: Follow-up Information   [facility]. Schedule an appointment as soon as possible for a visit in 1 week. (For hospital follow up)    Contact information:   Fax [phone]      Follow up with Candace CruiseSYDNOR,CHARLES F, MD On 01/30/2014. (for opthalmology follow up)    Specialty:  Unknown Physician Specialty   Contact information:   [address]   [place] [address] [phone]       [facility]. (our office will call you to scheduled an appointment for you to pick up a heart monitor)       Joanna Puffrystal S Dorsey, MD 01/28/2014, 9:57 PM PGY-1, [facility]

## 2014-01-26 NOTE — H&P (Addendum)
FMTS Attending Admission Note: Miguel DonJeff Walden MD Personal pager:  [phone] FPTS Service Pager:  [phone]  I  have seen and examined this patient, reviewed their chart. I have discussed this patient with the resident. I agree with the resident's findings, assessment and care plan.  Additionally:  43 yo M with somewhat confusing past medical history including diabetes mellitus type 2 of unknown exact duration but diagnosed in 2013, peripheral neuropathy, HLD, known Left vision loss who presents with syncope.  States micturating yesterday, finished, and walked into hallway.  Next thing he remembers is waking on hallway floor. Called girlfriend who had nephew check on him.  Unclear length of time for LOC.  No palpitations or other prodrome events.    Also reports loss of vision in Right eye last Saturday, acutely.  Has not yet returned.  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   Of note, describes losing vision in Right eye several months ago, returned after 4 days.  Vision in Left eye lost in 2013, also an acute event.  States secondary to complications of diabetes -- yet states before 2013 was "pre-diabetic" range based on A1C at PCP office.  Also reports periods of chest pressure at rest which occurred last week.  Exam: Gen:  Male, lying in bed, NAD HEENT: [place]/AT.  EOMI.  PERRL.  Also with concentric pupillary light reflex.  Unable to visualize any light/contrasting darkness.  Sclera clear without injection.   Neck:  C-spine TTP.  No bruising noted.   Heart:  RRR.  No murmur noted.  Somewhat increased in size to percussion Lungs:  Clear throughout, posterior chest Abd:  Benign Ext:  No trauma noted.  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    Imp/Plan: 1. Syncope: - CXR read as normal, yet cardiac silhouette appears on upper limit of normal if not large - Needs echo for structural  heart evaluation.  Has never had one in past - [name] with calcifications noted.  Initially concern for small bleed - Micturation vaso-vagal event a possibility, but must otherwise rule out cardiogenic syncope.    2.  Loss of vision, acute, without eye pain or redness: - Left eye vision loss appears related to remote infarction from 2013 - Right eye vision concerning for amaurosis fugax vs retinal artery occlusion.   - Obtain carotid dopplers and attempt to obtain outside records from ophtho office.   - Yesterday's [name] negative for any cause of vision loss or syncope -- but does show scattered small vessel disease  3.  Chest pressure: - occurred several times last week,  - had normal nuclear stress test in 2009  - with TWI on inferior leads.  Only 1 troponin that I can see, which was negative - Atypical chest pain -- though I do not trust qualification of chest pain in diabetic with known stroke - needs antiplatelet therapy, statin, TSH, lipid panel - Cards consult for chest pain and syncope evaluation  4. C-spine tenderness: - I do not see any neck films.  Will obtain now  Tobey GrimJeffrey H Walden, MD 01/26/2014 1:40 PM

## 2014-01-26 NOTE — Consult Note (Signed)
CARDIOLOGY CONSULT NOTE   Patient ID: Miguel Diaz. MRN: [id number], DOB/AGE: 41-May-1972   Admit date: 01/25/2014 Date of Consult: 01/26/2014   Primary Physician: PROVIDER NOT IN SYSTEM Primary Cardiologist: None  Pt. Profile  43 year old gentleman admitted with syncope.  Problem List  Past Medical History  Diagnosis Date  . Diabetes mellitus without complication   . Neuropathy   . Asthma   . Hypercholesteremia   . Vision loss of left eye   . Depression   . Anxiety     Past Surgical History  Procedure Laterality Date  . Hernia repair    . Eye surgery       Allergies  Allergies  Allergen Reactions  . Penicillins Other (See Comments)    Childhood allergy  . Toradol [Ketorolac Tromethamine] Hives    HPI   This 43 year old gentleman was admitted after having an episode of syncope at home.  It occurred yesterday afternoon.  He had just finished voiding and flushed the toilet and was in the process of turning back toward the living room when he blacked out.  He did not have any antecedent warning.  It is unclear how long he was unconscious.  He states that he does not have any prior history of syncope.  He does have a past history of diabetes.  He has a history of hypercholesterolemia.  He denies any history of hypertension.  He does state that he experiences occasional precordial chest tightness, not necessarily related to exertion.  He states that about 7 or 8 years ago he was admitted to the hospital in [place] for chest discomfort and had a stress test which was negative.  The patient has not been aware of any racing of his heart or palpitations.  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  Inpatient Medications  . aspirin EC  81 mg Oral Daily  . atorvastatin  40 mg Oral Daily  . FLUoxetine  20 mg Oral Daily  . insulin  aspart  0-9 Units Subcutaneous TID WC  . lisinopril  2.5 mg Oral Daily  . pregabalin  150 mg Oral BID  . sodium chloride  3 mL Intravenous Q12H  . sodium chloride  3 mL Intravenous Q12H    Family History History reviewed.  Patient's father has had coronary artery bypass graft surgery.  Patient's mother died of congestive heart failure.  The patient has a sister who has heart failure and a defibrillator.  Social History History   Social History  . Marital Status: Single    Spouse Name: N/A    Number of Children: N/A  . Years of Education: N/A   Occupational History  . Not on file.   Social History Main Topics  . Smoking status: Current Every Day Smoker -- 1.00 packs/day    Types: Cigarettes  . Smokeless tobacco: Not on file  . Alcohol Use: Yes     Comment: occ  . Drug Use: No  . Sexual Activity: Not on file   Other Topics Concern  . Not on file   Social History Narrative  . No narrative on file     Review of Systems  General:  No chills, fever, night sweats or weight changes.  Cardiovascular:  No chest pain, dyspnea on exertion, edema, orthopnea, palpitations, paroxysmal nocturnal dyspnea. Dermatological: No  rash, lesions/masses Respiratory: No cough, dyspnea Urologic: No hematuria, dysuria Abdominal:   No nausea, vomiting, diarrhea, bright red blood per rectum, melena, or hematemesis Neurologic:  No visual changes, wkns, changes in mental status. All other systems reviewed and are otherwise negative except as noted above.  Physical Exam  Blood pressure 114/73, pulse 97, temperature 98.3 F (36.8 C), temperature source Oral, resp. rate 18, height 5\' 8"  (1.727 m), weight 185 lb 3.2 oz (84.006 kg), SpO2 97.00%.  General: Pleasant, NAD Psych: Normal affect. Neuro: Alert and oriented X 3. Moves all extremities spontaneously. HEENT: Normal  Neck: Supple without bruits or JVD. Lungs:  Resp regular and unlabored, CTA. Heart: RRR no s3, s4, or murmurs. Abdomen:  Soft, non-tender, non-distended, BS + x 4.  Extremities: No clubbing, cyanosis or edema. DP/PT/Radials 2+ and equal bilaterally.  Labs   Recent Labs  01/26/14 1030  TROPONINI <0.30   Lab Results  Component Value Date   WBC 7.4 01/26/2014   HGB 15.1 01/26/2014   HCT 44.2 01/26/2014   MCV 92.3 01/26/2014   PLT PLATELET CLUMPS NOTED ON SMEAR, COUNT APPEARS DECREASED 01/26/2014     Recent Labs Lab 01/26/14 0316  NA 142  K 4.0  CL 102  CO2 29  BUN 7  CREATININE 0.82  CALCIUM 8.8  GLUCOSE 163*   No results found for this basename: CHOL,  HDL,  LDLCALC,  TRIG   No results found for this basename: DDIMER    Radiology/Studies  Dg Chest 2 View  01/25/2014   CLINICAL DATA:  Larey SeatFell.  Right hip pain.  EXAM: CHEST  2 VIEW  COMPARISON:  12/21/2012.  FINDINGS: The cardiac silhouette, mediastinal and hilar contours are within normal limits and stable. There is mild tortuosity of the thoracic aorta. The lungs demonstrate mild chronic bronchitic changes but no acute overlying pulmonary process. No pleural effusion. The bony thorax is intact. The thoracic vertebral bodies are normally aligned. No acute fracture. The sternum is intact.  IMPRESSION: Mild chronic bronchitic type lung changes but no acute pulmonary findings.  Intact bony thorax.   Electronically Signed   By: Loralie ChampagneMark  Gallerani M.D.   On: 01/25/2014 17:45   Dg Cervical Spine Complete  01/26/2014   CLINICAL DATA:  Pain post trauma  EXAM: CERVICAL SPINE  4+ VIEWS  COMPARISON:  None.  FINDINGS: Frontal, lateral, open-mouth odontoid, and bilateral oblique views were obtained. There is no fracture or spondylolisthesis. Prevertebral soft tissues and predental space regions are normal. Disc spaces appear intact. There are small focus calcification in the anterior ligament at C4-5 and C5-6. There is no appreciable exit foraminal narrowing on the oblique views.  IMPRESSION: No fracture or spondylolisthesis. Mild anterior ligament calcification at  C4-5 and C5-6.   Electronically Signed   By: Bretta BangWilliam  Woodruff M.D.   On: 01/26/2014 15:34   Dg Shoulder Right  01/25/2014   CLINICAL DATA:  Larey SeatFell.  Right shoulder pain.  EXAM: RIGHT SHOULDER - 2+ VIEW  COMPARISON:  None.  FINDINGS: The joint spaces are maintained. No acute fracture. No abnormal soft tissue calcifications. The visualized right lung is clear.  IMPRESSION: No fracture or dislocation.   Electronically Signed   By: Loralie ChampagneMark  Gallerani M.D.   On: 01/25/2014 17:45   Dg Hip Complete Right  01/25/2014   CLINICAL DATA:  Larey SeatFell.  Right hip pain.  EXAM: RIGHT HIP - COMPLETE 2+ VIEW  COMPARISON:  None.  FINDINGS: The hips are normally located. Minimal degenerative changes. No acute fracture  or plain film evidence of avascular necrosis. The pubic symphysis and SI joints are intact. Mild SI joint degenerative changes.  IMPRESSION: No acute bony findings.   Electronically Signed   By: Loralie Champagne M.D.   On: 01/25/2014 17:43   Ct Head Wo Contrast  01/25/2014   CLINICAL DATA:  Fall.  EXAM: CT HEAD WITHOUT CONTRAST  CT CERVICAL SPINE WITHOUT CONTRAST  TECHNIQUE: Multidetector CT imaging of the head and cervical spine was performed following the standard protocol without intravenous contrast. Multiplanar CT image reconstructions of the cervical spine were also generated.  COMPARISON:  [name] 01/23/2014.  CT 03/24/2012.  FINDINGS: CT HEAD FINDINGS  No mass. No hydrocephalus. No hemorrhage. Punctate 3 mm focus of increased density noted in the right posterior fossa. This could be in in the subarachnoid space or the right cerebellar hemisphere. No mass effect. No other foci of hemorrhage noted. No acute bony abnormality identified. Ill-defined fluid collection noted in the right frontal sinus suggesting sinusitis. Multiple mucous retention cysts noted throughout the paranasal sinuses. Tiny left occipital scalp lipoma noted.  CT CERVICAL SPINE FINDINGS  Shotty cervical lymph nodes are noted. Cervical airway is widely  patent. Thyroid is unremarkable. Apical pleural parenchymal thickening present most consistent with scarring. No acute fracture or dislocation noted.  IMPRESSION: 1. Punctate 3 mm focus of increased is seen in the right posterior fossa. This could be a tiny subarachnoid hemorrhage or right cerebellar hemorrhage. 2. Right frontal sinusitis. Multiple mucous retention cyst noted throughout the paranasal sinuses. 3. No evidence of cervical spine fracture or dislocation. Critical Value/emergent results were called by telephone at the time of interpretation on 01/25/2014 at 5:19 pm to PA [facility] , who verbally acknowledged these results.   Electronically Signed   By: Maisie Fus  Register   On: 01/25/2014 17:21   Ct Cervical Spine Wo Contrast  01/25/2014   CLINICAL DATA:  Fall.  EXAM: CT HEAD WITHOUT CONTRAST  CT CERVICAL SPINE WITHOUT CONTRAST  TECHNIQUE: Multidetector CT imaging of the head and cervical spine was performed following the standard protocol without intravenous contrast. Multiplanar CT image reconstructions of the cervical spine were also generated.  COMPARISON:  [name] 01/23/2014.  CT 03/24/2012.  FINDINGS: CT HEAD FINDINGS  No mass. No hydrocephalus. No hemorrhage. Punctate 3 mm focus of increased density noted in the right posterior fossa. This could be in in the subarachnoid space or the right cerebellar hemisphere. No mass effect. No other foci of hemorrhage noted. No acute bony abnormality identified. Ill-defined fluid collection noted in the right frontal sinus suggesting sinusitis. Multiple mucous retention cysts noted throughout the paranasal sinuses. Tiny left occipital scalp lipoma noted.  CT CERVICAL SPINE FINDINGS  Shotty cervical lymph nodes are noted. Cervical airway is widely patent. Thyroid is unremarkable. Apical pleural parenchymal thickening present most consistent with scarring. No acute fracture or dislocation noted.  IMPRESSION: 1. Punctate 3 mm focus of increased is seen in the  right posterior fossa. This could be a tiny subarachnoid hemorrhage or right cerebellar hemorrhage. 2. Right frontal sinusitis. Multiple mucous retention cyst noted throughout the paranasal sinuses. 3. No evidence of cervical spine fracture or dislocation. Critical Value/emergent results were called by telephone at the time of interpretation on 01/25/2014 at 5:19 pm to PA [facility] , who verbally acknowledged these results.   Electronically Signed   By: Maisie Fus  Register   On: 01/25/2014 17:21   Mr Brain Wo Contrast  01/25/2014   CLINICAL DATA:  Syncope.  Punctate hemorrhage  on CT.  EXAM: [name] HEAD WITHOUT CONTRAST  TECHNIQUE: Multiplanar, multiecho pulse sequences of the brain and surrounding structures were obtained without intravenous contrast.  COMPARISON:  [name] 01/25/2014 and 03/24/2012. Brain [name] 01/23/2014.  FINDINGS: Right larger than left occipital scalp lipomas are noted. There is no evidence of acute infarct, intracranial hemorrhage, mass, midline shift, or extra-axial fluid collection. Numerous small foci of T2 hyperintensity are present throughout the subcortical and deep cerebral white matter bilaterally, nonspecific but compatible with mild to moderate chronic small vessel ischemic disease, advanced for age. There is mild cerebral atrophy. Remote, small infarct is again noted in the posterior right temporal lobe periventricular white matter.  There is no evidence of hemorrhage or other abnormality near the right foramen of Luschka to correspond with the punctate hyperdensity described on CT. The distal left vertebral artery flow void is small. Other major intracranial vascular flow voids appear preserved. Small bilateral staff alignments are noted. Mucosal thickening is present in the frontal sinuses and ethmoid air cells bilaterally. Mucosal thickening versus mucous retention cysts are noted in the maxillary sinuses, with a mucous retention cyst present in the right sphenoid sinus. Mastoid  air cells are clear.  IMPRESSION: 1. No evidence of acute intracranial abnormality. The punctate hyperattenuating focus near the right foramen of Luschka on recent CT also appears to have been present on the 2013 [name], although less conspicuous on the older examination. Therefore, this most likely represents calcification, such as in choroid plexus, rather than acute hemorrhage. 2. Mild-to-moderate chronic small vessel ischemic disease.   Electronically Signed   By: Sebastian Ache   On: 01/25/2014 21:58    ECG  Normal sinus rhythm T wave abnormality, consider inferior ischemia Abnormal ECG  2-D echo: - Left ventricle: The cavity size was normal. Wall thickness was at the upper limits of normal. Systolic function was normal. The estimated ejection fraction was in the range of 60% to 65%. Wall motion was normal; there were no regional wall motion abnormalities. Features are consistent with a pseudonormal left ventricular filling pattern, with concomitant abnormal relaxation and increased filling pressure (grade 2 diastolic dysfunction). - Aortic valve: Mildly calcified annulus. Trileaflet. There was no significant regurgitation. - Mitral valve: Calcified annulus. There was trivial regurgitation. - Right atrium: Central venous pressure (est): 3 mm Hg. - Atrial septum: No defect or patent foramen ovale was identified. - Tricuspid valve: There was trivial regurgitation. - Pulmonary arteries: PA peak pressure: 12 mm Hg (S). - Pericardium, extracardiac: A prominent pericardial fat pad was present.  Impressions:  - Upper normal wall thickness with LVEF 60-65%, grade 2 diastolic dysfunction. MAC with trivial mitral regurgitation. PASP 12 mmHg.  ASSESSMENT AND PLAN 1.  Syncope of undetermined etiology.  Telemetry here in the hospital has shown only normal sinus rhythm.  Possible micturition syncope although the patient denies having to strain to void. 2. abnormal EKG.  Multiple risk factors  for premature coronary disease including strong family history, diabetes, and hypercholesterolemia. 3. chest pressure uncertain etiology rule out ischemic heart disease  Plan: We will proceed with a Myoview stress test. Consider outpatient event monitor if no cause for syncope found during this hospitalization.  Signed, Cassell Clement, MD  01/26/2014, 7:02 PM

## 2014-01-27 ENCOUNTER — Observation Stay (HOSPITAL_COMMUNITY): Payer: Medicaid Other

## 2014-01-27 ENCOUNTER — Other Ambulatory Visit: Payer: Self-pay

## 2014-01-27 ENCOUNTER — Other Ambulatory Visit: Payer: Self-pay | Admitting: Cardiology

## 2014-01-27 DIAGNOSIS — S4980XA Other specified injuries of shoulder and upper arm, unspecified arm, initial encounter: Secondary | ICD-10-CM | POA: Diagnosis not present

## 2014-01-27 DIAGNOSIS — R55 Syncope and collapse: Secondary | ICD-10-CM | POA: Diagnosis not present

## 2014-01-27 DIAGNOSIS — S066XAA Traumatic subarachnoid hemorrhage with loss of consciousness status unknown, initial encounter: Secondary | ICD-10-CM | POA: Diagnosis not present

## 2014-01-27 DIAGNOSIS — S066X9A Traumatic subarachnoid hemorrhage with loss of consciousness of unspecified duration, initial encounter: Secondary | ICD-10-CM | POA: Diagnosis not present

## 2014-01-27 DIAGNOSIS — S79919A Unspecified injury of unspecified hip, initial encounter: Secondary | ICD-10-CM | POA: Diagnosis not present

## 2014-01-27 DIAGNOSIS — S46909A Unspecified injury of unspecified muscle, fascia and tendon at shoulder and upper arm level, unspecified arm, initial encounter: Secondary | ICD-10-CM | POA: Diagnosis not present

## 2014-01-27 LAB — LIPID PANEL
CHOL/HDL RATIO: 4.5 ratio
CHOLESTEROL: 152 mg/dL (ref 0–200)
HDL: 34 mg/dL — ABNORMAL LOW (ref >39)
LDL CALC: 75 mg/dL (ref 0–99)
Triglycerides: 213 mg/dL — ABNORMAL HIGH (ref <150)
VLDL: 43 mg/dL — AB (ref 0–40)

## 2014-01-27 LAB — GLUCOSE, CAPILLARY
GLUCOSE-CAPILLARY: 212 mg/dL — AB (ref 70–99)
GLUCOSE-CAPILLARY: 190 mg/dL — AB (ref 70–99)
Glucose-Capillary: 177 mg/dL — ABNORMAL HIGH (ref 70–99)

## 2014-01-27 LAB — TSH: TSH: 1.62 u[IU]/mL (ref 0.350–4.500)

## 2014-01-27 MED ORDER — TECHNETIUM TC 99M SESTAMIBI - CARDIOLITE
30.0000 | Freq: Once | INTRAVENOUS | Status: AC | PRN
  Administered 2014-01-27: 30 via INTRAVENOUS

## 2014-01-27 MED ORDER — HEART RATE MONITOR MISC: 1.0000 [IU] | Status: DC

## 2014-01-27 MED ORDER — TECHNETIUM TC 99M SESTAMIBI GENERIC - CARDIOLITE
10.0000 | Freq: Once | INTRAVENOUS | Status: AC | PRN
  Administered 2014-01-27: 10 via INTRAVENOUS

## 2014-01-27 MED ORDER — REGADENOSON 0.4 MG/5ML IV SOLN
INTRAVENOUS | Status: AC
  Administered 2014-01-27: 0.4 mg via INTRAVENOUS
  Filled 2014-01-27: qty 5

## 2014-01-27 MED ORDER — REGADENOSON 0.4 MG/5ML IV SOLN
0.4000 mg | Freq: Once | INTRAVENOUS | Status: AC
  Administered 2014-01-27: 0.4 mg via INTRAVENOUS
  Filled 2014-01-27: qty 5

## 2014-01-27 NOTE — Progress Notes (Signed)
FMTS Attending Daily Note:  Miguel DonJeff Walden MD  [phone] pager  Family Practice pager:  [phone] I have seen and examined this patient and have reviewed their chart. I have discussed this patient with the resident. I agree with the resident's findings, assessment and care plan.  Additionally:  - did well o/n.  No events on tele or cardiogenic sources of syncope - Echo with diastolic dysfunction but no structural cause of syncope - Myoview today - Appreciate cardiology recommendations.   - Agree with event monitor - Needs close FU with both PCP and ophtho with this recent vision loss  ** Late note:  Patient evaluated 11 AM today  Miguel GrimJeffrey H Walden, MD 01/27/2014

## 2014-01-27 NOTE — Progress Notes (Signed)
Patient Profile: 43 y/o male, with h/o HLD and DM admitted for syncope  Subjective: Currently w/o complaints. No further occurrence of syncope. Denies SP/ SOB.   Objective: Vital signs in last 24 hours: Temp:  [97.3 F (36.3 C)-98.3 F (36.8 C)] 97.3 F (36.3 C) (07/26 0500) Pulse Rate:  [65-113] 97 (07/26 1033) Resp:  [12-18] 16 (07/26 0500) BP: (105-124)/(59-80) 111/71 mmHg (07/26 1032) SpO2:  [94 %-97 %] 94 % (07/26 0500) Weight:  [188 lb 12.8 oz (85.639 kg)] 188 lb 12.8 oz (85.639 kg) (07/26 0500) Last BM Date: 01/25/14  Intake/Output from previous day: 07/25 0701 - 07/26 0700 In: 120 [P.O.:120] Out: -  Intake/Output this shift:    Medications Current Facility-Administered Medications  Medication Dose Route Frequency Provider Last Rate Last Dose  . 0.9 %  sodium chloride infusion  250 mL Intravenous PRN Tawni CarnesAndrew Wight, MD      . acetaminophen (TYLENOL) tablet 650 mg  650 mg Oral Q6H PRN Tawni CarnesAndrew Wight, MD       Or  . acetaminophen (TYLENOL) suppository 650 mg  650 mg Rectal Q6H PRN Tawni CarnesAndrew Wight, MD      . aspirin EC tablet 81 mg  81 mg Oral Daily Tobey GrimJeffrey H Walden, MD   81 mg at 01/26/14 1607  . atorvastatin (LIPITOR) tablet 40 mg  40 mg Oral Daily Tawni CarnesAndrew Wight, MD   40 mg at 01/26/14 1053  . FLUoxetine (PROZAC) capsule 20 mg  20 mg Oral Daily Tawni CarnesAndrew Wight, MD   20 mg at 01/26/14 1053  . insulin aspart (novoLOG) injection 0-9 Units  0-9 Units Subcutaneous TID WC Tawni CarnesAndrew Wight, MD   2 Units at 01/26/14 1733  . lisinopril (PRINIVIL,ZESTRIL) tablet 2.5 mg  2.5 mg Oral Daily Tawni CarnesAndrew Wight, MD   2.5 mg at 01/26/14 1052  . ondansetron (ZOFRAN) tablet 4 mg  4 mg Oral Q6H PRN Tawni CarnesAndrew Wight, MD       Or  . ondansetron [facility](ZOFRAN) injection 4 mg  4 mg Intravenous Q6H PRN Tawni CarnesAndrew Wight, MD      . pregabalin (LYRICA) capsule 150 mg  150 mg Oral BID Tawni CarnesAndrew Wight, MD   150 mg at 01/26/14 2216  . sodium chloride 0.9 % injection 3 mL  3 mL Intravenous Q12H Tawni CarnesAndrew Wight, MD   3 mL at 01/25/14 2132   . sodium chloride 0.9 % injection 3 mL  3 mL Intravenous Q12H Tawni CarnesAndrew Wight, MD   3 mL at 01/25/14 2132  . sodium chloride 0.9 % injection 3 mL  3 mL Intravenous PRN Tawni CarnesAndrew Wight, MD        PE: General appearance: alert, cooperative and no distress Lungs: clear to auscultation bilaterally Heart: regular rate and rhythm Extremities: no LEE Pulses: 2+ and symmetric Skin: warm and dry Neurologic: Grossly normal  Lab Results:   Recent Labs  01/25/14 1640 01/26/14 0316  WBC 6.8 7.4  HGB 15.7 15.1  HCT 44.5 44.2  PLT 122* PLATELET CLUMPS NOTED ON SMEAR, COUNT APPEARS DECREASED   BMET  Recent Labs  01/25/14 1640 01/26/14 0316  NA 141 142  K 4.2 4.0  CL 101 102  CO2 24 29  GLUCOSE 164* 163*  BUN 5* 7  CREATININE 0.51 0.82  CALCIUM 9.0 8.8   PT/INR No results found for this basename: LABPROT, INR,  in the last 72 hours Cholesterol  Recent Labs  01/27/14 0317  CHOL 152   Cardiac Enzymes Cardiac Panel (last 3 results)  Recent Labs  01/26/14 1030  TROPONINI <0.30    Studies/Results: 2D echo 01/26/14 Study Conclusions  - Left ventricle: The cavity size was normal. Wall thickness was at the upper limits of normal. Systolic function was normal. The estimated ejection fraction was in the range of 60% to 65%. Wall motion was normal; there were no regional wall motion abnormalities. Features are consistent with a pseudonormal left ventricular filling pattern, with concomitant abnormal relaxation and increased filling pressure (grade 2 diastolic dysfunction). - Aortic valve: Mildly calcified annulus. Trileaflet. There was no significant regurgitation. - Mitral valve: Calcified annulus. There was trivial regurgitation. - Right atrium: Central venous pressure (est): 3 mm Hg. - Atrial septum: No defect or patent foramen ovale was identified. - Tricuspid valve: There was trivial regurgitation. - Pulmonary arteries: PA peak pressure: 12 mm Hg (S). - Pericardium,  extracardiac: A prominent pericardial fat pad was present.  Impressions:  - Upper normal wall thickness with LVEF 60-65%, grade 2 diastolic dysfunction. MAC with trivial mitral regurgitation. PASP 12 mmHg.   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 Syncope: [name] normal. 2D echo with normal LV function. No effusion and no significant valvular abnormalities. + for grade 2 diastolic dsyfunction. Enzymes negative. NST completed. Results pending. If normal, consider an outpatient event monitor.     LOS: 2 days    Brittainy M. Delmer Islam 01/27/2014 11:15 AM  Patient seen and examined and history reviewed. Agree with above findings and plan. No recurrent syncope. No arrhythmia. Echo unremarkable. Awaiting Nuclear stress test results. If normal can arrange outpatient event monitor. Patient does not have a land phone line but does have access to a land line at his parent's house.  Peter [place], MDFACC 01/27/2014 12:04 PM

## 2014-01-27 NOTE — Progress Notes (Signed)
Family Medicine Teaching Service Daily Progress Note Intern Pager: [phone]  Patient name: Miguel CoeGeorge L Pilley Jr. Medical record number: [id number] Date of birth: 1970/12/20 Age: 43 y.o. Gender: male  Primary Care Provider: PROVIDER NOT IN SYSTEM Consultants: Neurology, cardiology Code Status: Full  Pt Overview and Major Events to Date:  7/24: Admitted for syncope evaluation  Assessment and Plan: Miguel CoeGeorge L Shoemaker Jr. is a 43 y.o. male presenting with a syncope like episode . PMH is significant for diabetes, peripheral neuropathy, asthma, anxiety, depression, vision loss (left), and hypercholesterolemia.  Syncope: Uncertain etiology. Head [name] normal, no history of heart disease, not orthostatic on admission. NSR on ECG with consistent T wave flattening on lateral precordial leads. - Consider event monitor as outpatient - Carotid dopplers  Atypical angina: Negative cardiac enzymes, unchanged NSR on ECG. History of negative stress test 7-8 years ago. TSH 1.620. ASCVD 10-year risk: 1.8% (on lipitor 40mg ).  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 No pathology noted on [name].  - Follow up with ophthalmologist as scheduled 7/29  T2DM: Hb A1c 8.6% on metformin 1g BID and glipizide 10mg . Good inpatient control on sensitive SSI - Sensitive SSI  FEN/GI: diet carb mod, saline lock Prophylaxis: SCDs  Disposition: Discharge home likely pending myoview results.  Subjective:  Pt without CP, SOB, palpitations following nuclear medicine study.   Objective: Temp:  [97.3 F (36.3 C)-98.3 F (36.8 C)] 97.3 F (36.3 C) (07/26 0500) Pulse Rate:  [68-97] 68 (07/26 0500) Resp:  [12-18] 16 (07/26 0500) BP: (108-114)/(67-80) 108/71 mmHg (07/26 0500) SpO2:  [94 %-97 %] 94 % (07/26 0500) Weight:  [188 lb 12.8 oz (85.639 kg)] 188 lb 12.8 oz (85.639 kg) (07/26 0500) Physical Exam: General: 43 y.o. male in NAD HEENT: PERRL, EOMI. Pt states no vision present  bilaterally Cardiovascular: RRR, normal heart sounds, no murmurs appreciated. 2+ radial and PT pulses bilaterally Respiratory: CTAB, normal effort Abdomen: soft, nontender, normal bowel sounds Extremities: no edema or cyanosis Neuro: alert and oriented, CN2-12 normal, strength 5/5 grip bilat, 4/5 ankle extension bilat  Laboratory:  Recent Labs Lab 01/25/14 1640 01/26/14 0316  WBC 6.8 7.4  HGB 15.7 15.1  HCT 44.5 44.2  PLT 122* PLATELET CLUMPS NOTED ON SMEAR, COUNT APPEARS DECREASED    Recent Labs Lab 01/25/14 1640 01/26/14 0316  NA 141 142  K 4.2 4.0  CL 101 102  CO2 24 29  BUN 5* 7  CREATININE 0.51 0.82  CALCIUM 9.0 8.8  GLUCOSE 164* 163*   Imaging/Diagnostic Tests: Dg Chest 2 View 01/25/2014 IMPRESSION: Mild chronic bronchitic type lung changes but no acute pulmonary findings.  Intact bony thorax.   Electronically Signed   By: Loralie ChampagneMark  Gallerani M.D.   On: 01/25/2014 17:45   Dg Shoulder Right 01/25/2014 IMPRESSION: No fracture or dislocation.   Electronically Signed   By: Loralie ChampagneMark  Gallerani M.D.   On: 01/25/2014 17:45   Dg Hip Complete Right 01/25/2014 IMPRESSION: No acute bony findings.   Electronically Signed   By: Loralie ChampagneMark  Gallerani M.D.   On: 01/25/2014 17:43   Ct Head Wo Contrast 01/25/2014 IMPRESSION: 1. Punctate 3 mm focus of increased is seen in the right posterior fossa. This could be a tiny subarachnoid hemorrhage or right cerebellar hemorrhage. 2. Right frontal sinusitis. Multiple mucous retention cyst noted throughout the paranasal sinuses. 3. No evidence of cervical spine fracture or dislocation. Critical Value/emergent results were called by telephone at the time of interpretation on 01/25/2014 at 5:19 pm  to PA [facility] , who verbally acknowledged these results.   Electronically Signed   By: Maisie Fus  Register   On: 01/25/2014 17:21   Ct Cervical Spine Wo Contrast 01/25/2014 IMPRESSION: 1. Punctate 3 mm focus of increased is seen in the right posterior fossa.  This could be a tiny subarachnoid hemorrhage or right cerebellar hemorrhage. 2. Right frontal sinusitis. Multiple mucous retention cyst noted throughout the paranasal sinuses. 3. No evidence of cervical spine fracture or dislocation. Critical Value/emergent results were called by telephone at the time of interpretation on 01/25/2014 at 5:19 pm to PA [facility] , who verbally acknowledged these results.   Electronically Signed   By: Maisie Fus  Register   On: 01/25/2014 17:21   Mr Brain Wo Contrast 01/25/2014 IMPRESSION: 1. No evidence of acute intracranial abnormality. The punctate hyperattenuating focus near the right foramen of Luschka on recent CT also appears to have been present on the 2013 [name], although less conspicuous on the older examination. Therefore, this most likely represents calcification, such as in choroid plexus, rather than acute hemorrhage. 2. Mild-to-moderate chronic small vessel ischemic disease.   Electronically Signed   By: Sebastian Ache   On: 01/25/2014 21:58   2-D echo:  - Upper normal wall thickness with LVEF 60-65%, grade 2 diastolic dysfunction. MAC with trivial mitral regurgitation. PASP 12 mmHg.  Myoview:  No definite inducible ischemia with pharmacologic stress.  67% ejection fraction  Hazeline Junker, MD 01/27/2014, 10:13 AM PGY-2, [facility] Family Medicine FPTS Intern pager: [phone], text pages welcome

## 2014-01-27 NOTE — Progress Notes (Signed)
INITIAL NUTRITION ASSESSMENT  DOCUMENTATION CODES Per approved criteria  -unable to assess   INTERVENTION: 1.  Supplements; consider PO supplement based on wt hx and PO intake once diet resumed.   NUTRITION DIAGNOSIS: Inadequate oral intake related to omission of energy dense food as evidenced by NPO.   Monitor:  1.  Food/Beverage; pt meeting >/=90% estimated needs with tolerance. 2.  Wt/wt change; monitor trends  Reason for Assessment: MST  43 y.o. male  Admitting Dx: syncope  ASS33SSMENT: Pt admitted with syncope.  Pt unavailable at time of visit- in nuclear medicine.  Pt with PMHx diabetes and hypercholesterolemia.  Pt has been largely NPO since admission, however did eat 75% of breakfast yesterday.   Pt reported wt loss and poor appetite on admission.   RD to follow for ongoing assessment.   Height: Ht Readings from Last 1 Encounters:  01/25/14 5\' 8"  (1.727 m)    Weight: Wt Readings from Last 1 Encounters:  01/27/14 188 lb 12.8 oz (85.639 kg)    Ideal Body Weight: 70 kg  % Ideal Body Weight: 121%  Wt Readings from Last 10 Encounters:  01/27/14 188 lb 12.8 oz (85.639 kg)    Usual Body Weight: unknown  % Usual Body Weight: unable to assess  BMI:  Body mass index is 28.71 kg/(m^2).  Estimated Nutritional Needs: Kcal: 1900-2100 Protein: 84-100g Fluid: >2.0 L/day  Skin: intact  Diet Order: NPO  EDUCATION NEEDS: -Education needs addressed  No intake or output data in the 24 hours ending 01/27/14 0923  Last BM: 7/24   Labs:   Recent Labs Lab 01/25/14 1640 01/26/14 0316  NA 141 142  K 4.2 4.0  CL 101 102  CO2 24 29  BUN 5* 7  CREATININE 0.51 0.82  CALCIUM 9.0 8.8  GLUCOSE 164* 163*    CBG (last 3)   Recent Labs  01/26/14 1650 01/26/14 2232 01/27/14 0732  GLUCAP 213* 178* 177*    Scheduled Meds: . aspirin EC  81 mg Oral Daily  . atorvastatin  40 mg Oral Daily  . FLUoxetine  20 mg Oral Daily  . insulin aspart  0-9 Units  Subcutaneous TID WC  . lisinopril  2.5 mg Oral Daily  . pregabalin  150 mg Oral BID  . regadenoson      . regadenoson  0.4 mg Intravenous Once  . sodium chloride  3 mL Intravenous Q12H  . sodium chloride  3 mL Intravenous Q12H    Continuous Infusions:   Past Medical History  Diagnosis Date  . Diabetes mellitus without complication   . Neuropathy   . Asthma   . Hypercholesteremia   . Vision loss of left eye   . Depression   . Anxiety     Past Surgical History  Procedure Laterality Date  . Hernia repair    . Eye surgery      Loyce DysKacie Mafalda Mcginniss, MS RD LDN Clinical Inpatient Dietitian Pager: [phone] Weekend/After hours pager: [phone]

## 2014-01-27 NOTE — Progress Notes (Signed)
VASCULAR LAB PRELIMINARY  PRELIMINARY  PRELIMINARY  PRELIMINARY  Carotid Dopplers completed.    Preliminary report:  1-39% ICA stenosis.  Vertebral artery flow is antegrade.  Seraiah Nowack, RVT 01/27/2014, 3:09 PM

## 2014-01-27 NOTE — Progress Notes (Signed)
Utilization review completed.  

## 2014-01-27 NOTE — Progress Notes (Signed)
FMTS Attending Daily Note:  Renold DonJeff Sarea Fyfe MD  [phone] pager  Family Practice pager:  [phone] I have seen and examined this patient and have reviewed their chart. I have discussed this patient with the resident. I agree with the resident's findings, assessment and care plan.  Additionally:  See separate admit note from yesterday (*late note)  Tobey GrimJeffrey H Amily Depp, MD 01/27/2014 9:49 AM

## 2014-01-27 NOTE — Discharge Instructions (Signed)
You were admitted for syncope (passing out) and observed overnight. Your heart was monitored and did not show evidence of arrhythmia that could have caused the syncope. A CT of your head was initially concerning for a possible bleed, but the [name] did not show any evidence of bleeding and the CT finding was likely a calcification that has been present since at least 2013; this can be a normal finding and does not require further evaluation. The most likely explanation for your syncope is called "Post-micturition" or "post-void" or "vasovagal" syncope, where straining with voiding or bowel movements cause a nerve to slow the heart down, decreasing blood flow to your brain and causing you to pass out.   Vasovagal Syncope, Adult Syncope, commonly known as fainting, is a temporary loss of consciousness. It occurs when the blood flow to the brain is reduced. Vasovagal syncope (also called neurocardiogenic syncope) is a fainting spell in which the blood flow to the brain is reduced because of a sudden drop in heart rate and blood pressure. Vasovagal syncope occurs when the brain and the cardiovascular system (blood vessels) do not adequately communicate and respond to each other. This is the most common cause of fainting. It often occurs in response to fear or some other type of emotional or physical stress. The body has a reaction in which the heart starts beating too slowly or the blood vessels expand, reducing blood pressure. This type of fainting spell is generally considered harmless. However, injuries can occur if a person takes a sudden fall during a fainting spell.  CAUSES  Vasovagal syncope occurs when a person's blood pressure and heart rate decrease suddenly, usually in response to a trigger. Many things and situations can trigger an episode. Some of these include:   Pain.   Fear.   The sight of blood or medical procedures, such as blood being drawn from a vein.   Common activities, such as  coughing, swallowing, stretching, or going to the bathroom.   Emotional stress.   Prolonged standing, especially in a warm environment.   Lack of sleep or rest.   Prolonged lack of food.   Prolonged lack of fluids.   Recent illness.  The use of certain drugs that affect blood pressure, such as cocaine, alcohol, marijuana, inhalants, and opiates.  SYMPTOMS  Before the fainting episode, you may:   Feel dizzy or light headed.   Become pale.  Sense that you are going to faint.   Feel like the room is spinning.   Have tunnel vision, only seeing directly in front of you.   Feel sick to your stomach (nauseous).   See spots or slowly lose vision.   Hear ringing in your ears.   Have a headache.   Feel warm and sweaty.   Feel a sensation of pins and needles. During the fainting spell, you will generally be unconscious for no longer than a couple minutes before waking up and returning to normal. If you get up too quickly before your body can recover, you may faint again. Some twitching or jerky movements may occur during the fainting spell.  DIAGNOSIS  Your caregiver will ask about your symptoms, take a medical history, and perform a physical exam. Various tests may be done to rule out other causes of fainting. These may include blood tests and tests to check the heart, such as electrocardiography, echocardiography, and possibly an electrophysiology study. When other causes have been ruled out, a test may be done to check the body's  response to changes in position (tilt table test). TREATMENT  Most cases of vasovagal syncope do not require treatment. Your caregiver may recommend ways to avoid fainting triggers and may provide home strategies for preventing fainting. If you must be exposed to a possible trigger, you can drink additional fluids to help reduce your chances of having an episode of vasovagal syncope. If you have warning signs of an oncoming episode, you can  respond by positioning yourself favorably (lying down). If your fainting spells continue, you may be given medicines to prevent fainting. Some medicines may help make you more resistant to repeated episodes of vasovagal syncope. Special exercises or compression stockings may be recommended. In rare cases, the surgical placement of a pacemaker is considered. HOME CARE INSTRUCTIONS   Learn to identify the warning signs of vasovagal syncope.   Sit or lie down at the first warning sign of a fainting spell. If sitting, put your head down between your legs. If you lie down, swing your legs up in the air to increase blood flow to the brain.   Avoid hot tubs and saunas.  Avoid prolonged standing.  Drink enough fluids to keep your urine clear or pale yellow. Avoid caffeine.  Increase salt in your diet as directed by your caregiver.   If you have to stand for a long time, perform movements such as:   Crossing your legs.   Flexing and stretching your leg muscles.   Squatting.   Moving your legs.   Bending over.   Only take over-the-counter or prescription medicines as directed by your caregiver. Do not suddenly stop any medicines without asking your caregiver first. SEEK MEDICAL CARE IF:   Your fainting spells continue or happen more frequently in spite of treatment.   You lose consciousness for more than a couple minutes.  You have fainting spells during or after exercising or after being startled.   You have new symptoms that occur with the fainting spells, such as:   Shortness of breath.  Chest pain.   Irregular heartbeat.   You have episodes of twitching or jerky movements that last longer than a few seconds.  You have episodes of twitching or jerky movements without obvious fainting. SEEK IMMEDIATE MEDICAL CARE IF:   You have injuries or bleeding after a fainting spell.   You have episodes of twitching or jerky movements that last longer than 5 minutes.    You have more than one spell of twitching or jerky movements before returning to consciousness after fainting. MAKE SURE YOU:   Understand these instructions.  Will watch your condition.  Will get help right away if you are not doing well or get worse. Document Released: 06/07/2012 Document Reviewed: 06/07/2012 [facility] Patient Information 2015 [place], [place]. This information is not intended to replace advice given to you by your health care provider. Make sure you discuss any questions you have with your health care provider.

## 2014-01-30 NOTE — Discharge Summary (Signed)
Family Medicine Teaching Service  Discharge Note : Attending Jeff Bellamie Turney MD Pager [phone] Inpatient Team Pager:  [phone]  I have reviewed this patient and the patient's chart and have discussed discharge planning with the resident at the time of discharge. I agree with the discharge plan as above.    

## 2014-01-31 ENCOUNTER — Telehealth: Payer: Self-pay | Admitting: Cardiology

## 2014-01-31 DIAGNOSIS — R55 Syncope and collapse: Secondary | ICD-10-CM

## 2014-01-31 NOTE — Telephone Encounter (Signed)
Per After Hours - call per [place], Mr. Miguel Diaz need a 30 day event monitor.   Mr. Miguel Diaz is self pay and I have mailed him the application for [employer] with [employer].  Patient is to fill out and return to me.  Once this is done I will send the info to [employer] to see if he meet their  application requirements.

## 2014-01-31 NOTE — Telephone Encounter (Signed)
Order placed for 30 day event monitor.  This is FYI per Omar PersonSharon Ferguson

## 2014-02-02 DIAGNOSIS — I639 Cerebral infarction, unspecified: Secondary | ICD-10-CM

## 2014-02-02 HISTORY — DX: Cerebral infarction, unspecified: I63.9

## 2014-02-11 ENCOUNTER — Emergency Department (HOSPITAL_COMMUNITY): Payer: Medicaid Other

## 2014-02-11 ENCOUNTER — Inpatient Hospital Stay (HOSPITAL_COMMUNITY): Payer: Medicaid Other

## 2014-02-11 ENCOUNTER — Encounter (HOSPITAL_COMMUNITY): Payer: Self-pay | Admitting: Emergency Medicine

## 2014-02-11 ENCOUNTER — Inpatient Hospital Stay (HOSPITAL_COMMUNITY)
Admission: EM | Admit: 2014-02-11 | Discharge: 2014-02-13 | DRG: 065 | Disposition: A | Discharge status: Home Health | Payer: Medicaid Other | Attending: Internal Medicine | Admitting: Internal Medicine

## 2014-02-11 DIAGNOSIS — E7800 Pure hypercholesterolemia, unspecified: Secondary | ICD-10-CM | POA: Diagnosis present

## 2014-02-11 DIAGNOSIS — I639 Cerebral infarction, unspecified: Secondary | ICD-10-CM

## 2014-02-11 DIAGNOSIS — E1159 Type 2 diabetes mellitus with other circulatory complications: Secondary | ICD-10-CM | POA: Diagnosis present

## 2014-02-11 DIAGNOSIS — I10 Essential (primary) hypertension: Secondary | ICD-10-CM

## 2014-02-11 DIAGNOSIS — E785 Hyperlipidemia, unspecified: Secondary | ICD-10-CM | POA: Diagnosis present

## 2014-02-11 DIAGNOSIS — R29898 Other symptoms and signs involving the musculoskeletal system: Secondary | ICD-10-CM | POA: Diagnosis present

## 2014-02-11 DIAGNOSIS — Z720 Tobacco use: Secondary | ICD-10-CM

## 2014-02-11 DIAGNOSIS — I798 Other disorders of arteries, arterioles and capillaries in diseases classified elsewhere: Secondary | ICD-10-CM | POA: Diagnosis present

## 2014-02-11 DIAGNOSIS — Z8673 Personal history of transient ischemic attack (TIA), and cerebral infarction without residual deficits: Secondary | ICD-10-CM

## 2014-02-11 DIAGNOSIS — I5032 Chronic diastolic (congestive) heart failure: Secondary | ICD-10-CM | POA: Diagnosis present

## 2014-02-11 DIAGNOSIS — F17200 Nicotine dependence, unspecified, uncomplicated: Secondary | ICD-10-CM | POA: Diagnosis present

## 2014-02-11 DIAGNOSIS — F411 Generalized anxiety disorder: Secondary | ICD-10-CM | POA: Diagnosis present

## 2014-02-11 DIAGNOSIS — I6330 Cerebral infarction due to thrombosis of unspecified cerebral artery: Principal | ICD-10-CM | POA: Diagnosis present

## 2014-02-11 DIAGNOSIS — F3289 Other specified depressive episodes: Secondary | ICD-10-CM | POA: Diagnosis present

## 2014-02-11 DIAGNOSIS — F32A Depression, unspecified: Secondary | ICD-10-CM

## 2014-02-11 DIAGNOSIS — F329 Major depressive disorder, single episode, unspecified: Secondary | ICD-10-CM | POA: Diagnosis present

## 2014-02-11 DIAGNOSIS — H543 Unqualified visual loss, both eyes: Secondary | ICD-10-CM | POA: Diagnosis present

## 2014-02-11 HISTORY — DX: Unspecified visual loss: H54.7

## 2014-02-11 HISTORY — DX: Cerebral infarction, unspecified: I63.9

## 2014-02-11 LAB — I-STAT CHEM 8, ED
BUN: 4 mg/dL — ABNORMAL LOW (ref 6–23)
CREATININE: 0.7 mg/dL (ref 0.50–1.35)
Calcium, Ion: 1.14 mmol/L (ref 1.12–1.23)
Chloride: 103 mEq/L (ref 96–112)
GLUCOSE: 221 mg/dL — AB (ref 70–99)
HCT: 40 % (ref 39.0–52.0)
Hemoglobin: 13.6 g/dL (ref 13.0–17.0)
Potassium: 3.7 mEq/L (ref 3.7–5.3)
Sodium: 139 mEq/L (ref 137–147)
TCO2: 23 mmol/L (ref 0–100)

## 2014-02-11 LAB — COMPREHENSIVE METABOLIC PANEL
ALK PHOS: 108 U/L (ref 39–117)
ALT: 34 U/L (ref 0–53)
AST: 23 U/L (ref 0–37)
Albumin: 3.7 g/dL (ref 3.5–5.2)
Anion gap: 15 (ref 5–15)
BILIRUBIN TOTAL: 0.2 mg/dL — AB (ref 0.3–1.2)
BUN: 6 mg/dL (ref 6–23)
CHLORIDE: 102 meq/L (ref 96–112)
CO2: 22 meq/L (ref 19–32)
Calcium: 8.9 mg/dL (ref 8.4–10.5)
Creatinine, Ser: 0.63 mg/dL (ref 0.50–1.35)
GFR calc Af Amer: >90 mL/min (ref >90)
GFR calc non Af Amer: >90 mL/min (ref >90)
Glucose, Bld: 215 mg/dL — ABNORMAL HIGH (ref 70–99)
Potassium: 3.9 mEq/L (ref 3.7–5.3)
Sodium: 139 mEq/L (ref 137–147)
Total Protein: 6.5 g/dL (ref 6.0–8.3)

## 2014-02-11 LAB — APTT: APTT: 28 s (ref 24–37)

## 2014-02-11 LAB — CREATININE, SERUM
Creatinine, Ser: 0.62 mg/dL (ref 0.50–1.35)
GFR calc Af Amer: >90 mL/min (ref >90)
GFR calc non Af Amer: >90 mL/min (ref >90)

## 2014-02-11 LAB — GLUCOSE, CAPILLARY
Glucose-Capillary: 126 mg/dL — ABNORMAL HIGH (ref 70–99)
Glucose-Capillary: 172 mg/dL — ABNORMAL HIGH (ref 70–99)

## 2014-02-11 LAB — URINALYSIS, ROUTINE W REFLEX MICROSCOPIC
BILIRUBIN URINE: NEGATIVE
Glucose, UA: 500 mg/dL — AB
Hgb urine dipstick: NEGATIVE
Ketones, ur: NEGATIVE mg/dL
Leukocytes, UA: NEGATIVE
Nitrite: NEGATIVE
Protein, ur: NEGATIVE mg/dL
SPECIFIC GRAVITY, URINE: 1.026 (ref 1.005–1.030)
UROBILINOGEN UA: 0.2 mg/dL (ref 0.0–1.0)
pH: 7 (ref 5.0–8.0)

## 2014-02-11 LAB — RAPID URINE DRUG SCREEN, HOSP PERFORMED
Amphetamines: NOT DETECTED
BARBITURATES: NOT DETECTED
Benzodiazepines: NOT DETECTED
Cocaine: NOT DETECTED
Opiates: NOT DETECTED
TETRAHYDROCANNABINOL: NOT DETECTED

## 2014-02-11 LAB — CBC
HCT: 39.8 % (ref 39.0–52.0)
HEMATOCRIT: 38.6 % — AB (ref 39.0–52.0)
Hemoglobin: 13.7 g/dL (ref 13.0–17.0)
Hemoglobin: 14 g/dL (ref 13.0–17.0)
MCH: 31.4 pg (ref 26.0–34.0)
MCH: 31.3 pg (ref 26.0–34.0)
MCHC: 35.5 g/dL (ref 30.0–36.0)
MCHC: 35.2 g/dL (ref 30.0–36.0)
MCV: 88.3 fL (ref 78.0–100.0)
MCV: 88.8 fL (ref 78.0–100.0)
PLATELETS: 125 10*3/uL — AB (ref 150–400)
Platelets: 138 10*3/uL — ABNORMAL LOW (ref 150–400)
RBC: 4.37 MIL/uL (ref 4.22–5.81)
RBC: 4.48 MIL/uL (ref 4.22–5.81)
RDW: 13.1 % (ref 11.5–15.5)
RDW: 13.2 % (ref 11.5–15.5)
WBC: 6 10*3/uL (ref 4.0–10.5)
WBC: 6.7 10*3/uL (ref 4.0–10.5)

## 2014-02-11 LAB — DIFFERENTIAL
BASOS ABS: 0 10*3/uL (ref 0.0–0.1)
Basophils Relative: 1 % (ref 0–1)
Eosinophils Absolute: 0.2 10*3/uL (ref 0.0–0.7)
Eosinophils Relative: 3 % (ref 0–5)
Lymphocytes Relative: 32 % (ref 12–46)
Lymphs Abs: 1.9 10*3/uL (ref 0.7–4.0)
Monocytes Absolute: 0.5 10*3/uL (ref 0.1–1.0)
Monocytes Relative: 8 % (ref 3–12)
NEUTROS ABS: 3.3 10*3/uL (ref 1.7–7.7)
Neutrophils Relative %: 56 % (ref 43–77)

## 2014-02-11 LAB — I-STAT TROPONIN, ED: Troponin i, poc: 0 ng/mL (ref 0.00–0.08)

## 2014-02-11 LAB — I-STAT CG4 LACTIC ACID, ED: LACTIC ACID, VENOUS: 2.13 mmol/L (ref 0.5–2.2)

## 2014-02-11 LAB — PROTIME-INR
INR: 0.91 (ref 0.00–1.49)
Prothrombin Time: 12.3 seconds (ref 11.6–15.2)

## 2014-02-11 LAB — AMMONIA: Ammonia: 24 umol/L (ref 11–60)

## 2014-02-11 LAB — CBG MONITORING, ED: GLUCOSE-CAPILLARY: 185 mg/dL — AB (ref 70–99)

## 2014-02-11 LAB — ETHANOL: Alcohol, Ethyl (B): <11 mg/dL (ref 0–11)

## 2014-02-11 LAB — TROPONIN I: Troponin I: <0.3 ng/mL (ref <0.30)

## 2014-02-11 MED ORDER — METOCLOPRAMIDE HCL 5 MG/ML IJ SOLN: 10.0000 mg | Freq: Once | INTRAMUSCULAR | Status: DC

## 2014-02-11 MED ORDER — SENNOSIDES-DOCUSATE SODIUM 8.6-50 MG PO TABS: 1.0000 | ORAL_TABLET | Freq: Every evening | ORAL | Status: DC | PRN

## 2014-02-11 MED ORDER — LISINOPRIL 2.5 MG PO TABS
2.5000 mg | ORAL_TABLET | Freq: Every day | ORAL | Status: DC
  Administered 2014-02-12 – 2014-02-13 (×2): 2.5 mg via ORAL
  Filled 2014-02-11 (×2): qty 1

## 2014-02-11 MED ORDER — STROKE: EARLY STAGES OF RECOVERY BOOK
Freq: Once | Status: AC
  Administered 2014-02-11: 23:00:00
  Filled 2014-02-11: qty 1

## 2014-02-11 MED ORDER — ENOXAPARIN SODIUM 30 MG/0.3ML ~~LOC~~ SOLN
30.0000 mg | SUBCUTANEOUS | Status: DC
  Administered 2014-02-11: 30 mg via SUBCUTANEOUS
  Filled 2014-02-11: qty 0.3

## 2014-02-11 MED ORDER — INSULIN ASPART 100 UNIT/ML ~~LOC~~ SOLN
0.0000 [IU] | SUBCUTANEOUS | Status: DC
  Administered 2014-02-12: 2 [IU] via SUBCUTANEOUS
  Administered 2014-02-12: 3 [IU] via SUBCUTANEOUS
  Administered 2014-02-12 (×3): 2 [IU] via SUBCUTANEOUS
  Administered 2014-02-13: 1 [IU] via SUBCUTANEOUS

## 2014-02-11 MED ORDER — ASPIRIN 300 MG RE SUPP: 300.0000 mg | Freq: Every day | RECTAL | Status: DC

## 2014-02-11 MED ORDER — SODIUM CHLORIDE 0.9 % IV SOLN
Freq: Once | INTRAVENOUS | Status: DC
  Filled 2014-02-11: qty 2

## 2014-02-11 MED ORDER — ASPIRIN 325 MG PO TABS
325.0000 mg | ORAL_TABLET | Freq: Every day | ORAL | Status: DC
  Administered 2014-02-11 – 2014-02-12 (×2): 325 mg via ORAL
  Filled 2014-02-11 (×2): qty 1

## 2014-02-11 MED ORDER — ATORVASTATIN CALCIUM 40 MG PO TABS
40.0000 mg | ORAL_TABLET | Freq: Every day | ORAL | Status: DC
  Administered 2014-02-11 – 2014-02-12 (×2): 40 mg via ORAL
  Filled 2014-02-11 (×2): qty 1

## 2014-02-11 MED ORDER — FLUOXETINE HCL 20 MG PO CAPS
20.0000 mg | ORAL_CAPSULE | Freq: Every day | ORAL | Status: DC
  Administered 2014-02-12 – 2014-02-13 (×2): 20 mg via ORAL
  Filled 2014-02-11 (×2): qty 1

## 2014-02-11 MED ORDER — SODIUM CHLORIDE 0.9 % IV BOLUS (SEPSIS): 250.0000 mL | Freq: Once | INTRAVENOUS | Status: DC

## 2014-02-11 MED ORDER — PREGABALIN 75 MG PO CAPS
150.0000 mg | ORAL_CAPSULE | Freq: Two times a day (BID) | ORAL | Status: DC
  Administered 2014-02-11 – 2014-02-12 (×2): 150 mg via ORAL
  Filled 2014-02-11 (×2): qty 2

## 2014-02-11 MED ORDER — IOHEXOL 350 MG/ML SOLN
50.0000 mL | Freq: Once | INTRAVENOUS | Status: AC | PRN
  Administered 2014-02-11: 50 mL via INTRAVENOUS

## 2014-02-11 NOTE — ED Provider Notes (Signed)
CSN: [id number]     Arrival date & time 02/11/14  1646 History   First MD Initiated Contact with Patient 02/11/14 1652     Chief Complaint  Patient presents with  . Code Stroke     (Consider location/radiation/quality/duration/timing/severity/associated sxs/prior Treatment) HPI  Boone MasterGeorge Hammersmith is a 43 y.o. male with a past medical history of diabetes, high cholesterol, anxiety, depression, and blindness presenting for possible stroke. Per patient's girlfriend he was normal up until about 3:15 today, he had acute onset of drowsiness, right-sided weakness, right-sided numbness and tingling, and associated headache. Patient has difficulty getting specifics about his headache, it is primarily right-sided as best he can describe. Patient denies taking any sedating medications, or use EtOH, or recreational drugs. Patient has a history significant for abrupt loss of vision in one of his eyes which occurred on July 18 of this year, he had lost vision in the other eye in September of 2013. Prior to today's visit emergency department patient did not have a previous history of stroke. His visual loss was related to "eye hemorrhages"per his girlfriend. No recent illnesses, review of systems is limited due to patient being very drowsy, but negative aside from his current neurologic concerns as far as he can explain.     Past Medical History  Diagnosis Date  . CVA (cerebral infarction)   . Blindness   . Hypercholesteremia   . Depression   . Anxiety   . Stroke    Past Surgical History  Procedure Laterality Date  . Eye surgery Left    History reviewed. No pertinent family history. History  Substance Use Topics  . Smoking status: Current Every Day Smoker -- 1.00 packs/day    Types: Cigarettes  . Smokeless tobacco: Never Used  . Alcohol Use: 1.2 oz/week    2 Cans of beer per week     Comment: Ocassional drinker    Review of Systems  Eyes: Negative.   Respiratory: Negative.   Cardiovascular:  Negative.   Gastrointestinal: Negative.   Endocrine: Negative.   Genitourinary: Negative.   Musculoskeletal: Negative.   Skin: Negative.   Allergic/Immunologic: Negative.   Neurological: Positive for weakness, numbness and headaches.  Hematological: Negative.   Psychiatric/Behavioral: Negative.       Allergies  Penicillins and Toradol  Home Medications   Prior to Admission medications   Medication Sig Start Date End Date Taking? Authorizing Provider  atorvastatin (LIPITOR) 40 MG tablet Take 40 mg by mouth at bedtime.    Yes Historical Provider, MD  FLUoxetine (PROZAC) 20 MG capsule Take 20 mg by mouth daily.   Yes Historical Provider, MD  glipiZIDE (GLUCOTROL XL) 10 MG 24 hr tablet Take 10 mg by mouth daily with breakfast.   Yes Historical Provider, MD  ibuprofen (ADVIL,MOTRIN) 600 MG tablet Take 600 mg by mouth every 6 (six) hours as needed for moderate pain.   Yes Historical Provider, MD  lisinopril (PRINIVIL,ZESTRIL) 2.5 MG tablet Take 2.5 mg by mouth daily.   Yes Historical Provider, MD  metFORMIN (GLUCOPHAGE) 500 MG tablet Take 500 mg by mouth 2 (two) times daily with a meal.   Yes Historical Provider, MD  pregabalin (LYRICA) 150 MG capsule Take 150 mg by mouth 2 (two) times daily.   Yes Historical Provider, MD   BP 126/88  Pulse 81  Temp(Src) 98.3 F (36.8 C) (Oral)  Resp 18  SpO2 97% Physical Exam  Nursing note and vitals reviewed. Constitutional: He appears well-developed and well-nourished. No distress.  HENT:  Head: Normocephalic and atraumatic.  Right Ear: External ear normal.  Left Ear: External ear normal.  Nose: Nose normal.  Mouth/Throat: Oropharynx is clear and moist. No oropharyngeal exudate.  Eyes: Conjunctivae and EOM are normal. Pupils are equal, round, and reactive to light. Right eye exhibits no discharge. Left eye exhibits no discharge. No scleral icterus.  Neck: Normal range of motion. Neck supple. No JVD present. No tracheal deviation present. No  thyromegaly present.  Cardiovascular: Normal rate, regular rhythm, normal heart sounds and intact distal pulses.  Exam reveals no gallop and no friction rub.   No murmur heard. Pulmonary/Chest: Effort normal and breath sounds normal. No stridor. No respiratory distress. He has no wheezes. He has no rales. He exhibits no tenderness.  Abdominal: Soft. Bowel sounds are normal. He exhibits no distension. There is no tenderness. There is no rebound and no guarding.  Musculoskeletal: Normal range of motion. He exhibits no edema and no tenderness.  Lymphadenopathy:    He has no cervical adenopathy.  Neurological: He is alert. He is disoriented. He displays no atrophy and no tremor. A sensory deficit is present. He exhibits abnormal muscle tone. He displays no seizure activity. GCS eye subscore is 4. GCS verbal subscore is 4. GCS motor subscore is 6.  Gait not tested due to fall risk, subjective R sided facial weakness.  Unable to assess EOM. R sided strength 3/5 vs Left 5/5.  Skin: Skin is warm and dry. No rash noted. He is not diaphoretic. No erythema. No pallor.  Psychiatric: He has a normal mood and affect. His behavior is normal. Judgment and thought content normal.    ED Course  Procedures (including critical care time) Labs Review Labs Reviewed  CBC - Abnormal; Notable for the following:    HCT 38.6 (*)    Platelets 125 (*)    All other components within normal limits  COMPREHENSIVE METABOLIC PANEL - Abnormal; Notable for the following:    Glucose, Bld 215 (*)    Total Bilirubin 0.2 (*)    All other components within normal limits  URINALYSIS, ROUTINE W REFLEX MICROSCOPIC - Abnormal; Notable for the following:    Glucose, UA 500 (*)    All other components within normal limits  CBC - Abnormal; Notable for the following:    Platelets 138 (*)    All other components within normal limits  GLUCOSE, CAPILLARY - Abnormal; Notable for the following:    Glucose-Capillary 126 (*)    All other  components within normal limits  GLUCOSE, CAPILLARY - Abnormal; Notable for the following:    Glucose-Capillary 172 (*)    All other components within normal limits  I-STAT CHEM 8, ED - Abnormal; Notable for the following:    BUN 4 (*)    Glucose, Bld 221 (*)    All other components within normal limits  CBG MONITORING, ED - Abnormal; Notable for the following:    Glucose-Capillary 185 (*)    All other components within normal limits  ETHANOL  PROTIME-INR  APTT  DIFFERENTIAL  URINE RAPID DRUG SCREEN (HOSP PERFORMED)  CREATININE, SERUM  TROPONIN I  AMMONIA  HEMOGLOBIN A1C  TROPONIN I  TROPONIN I  LIPID PANEL  I-STAT TROPOININ, ED  I-STAT TROPOININ, ED  I-STAT CG4 LACTIC ACID, ED    Imaging Review Ct Angio Head W/cm &/or Wo Cm  02/11/2014   CLINICAL DATA:  Right-sided weakness.  Diabetic.  EXAM: CT ANGIOGRAPHY HEAD AND NECK  TECHNIQUE: Multidetector CT imaging of the  head and neck was performed using the standard protocol during bolus administration of intravenous contrast. Multiplanar CT image reconstructions and MIPs were obtained to evaluate the vascular anatomy. Carotid stenosis measurements (when applicable) are obtained utilizing NASCET criteria, using the distal internal carotid diameter as the denominator.  CONTRAST:  50mL OMNIPAQUE IOHEXOL 350 MG/ML SOLN  COMPARISON:  [name] ear earlier same day  FINDINGS: CTA HEAD FINDINGS  Both internal carotid arteries show atherosclerotic disease in the siphon. On the right, there is 30% stenosis in on the left there is 50% stenosis supra clinoid internal carotid arteries are widely patent. Anterior and middle cerebral vessels are patent bilaterally without proximal stenosis or evidence of embolic occlusion.  Both vertebral arteries are patent, with the left terminating and PICA. The right vertebral artery supplies the basilar. There is 30% stenosis of the proximal basilar artery but no stenosis beyond that. Superior cerebellar and posterior  cerebral arteries are patent.  No evidence of acute brain infarction. No evidence of venous occlusion.  Review of the MIP images confirms the above findings.  CTA NECK FINDINGS  Lung apices are clear. There is atherosclerosis of the aorta with unfolding. Branching pattern of the brachiocephalic vessels from the arch is normal without origin stenosis.  Right common carotid artery shows atherosclerosis with wall thickening but no stenosis. There is some atherosclerosis of the carotid bifurcation but no stenosis. The right ICA is tortuous but widely patent to the skullbase.  Left common carotid artery shows atherosclerosis with wall thickening but no stenosis. There is atherosclerosis of the carotid bifurcation but no stenosis. The left cervical ICA shows wall thickening but no stenosis, being patent to the skullbase.  Both vertebral artery origins are widely patent with the right being dominant. Both vertebral arteries are widely patent through the cervical region.  Review of the MIP images confirms the above findings.  IMPRESSION: No carotid bifurcation stenosis. Smooth atherosclerotic thickening of the vascular walls.  Carotid siphon atherosclerosis bilaterally, 30% stenosis on the right and 50% stenosis on the left.  No evidence of large or medium vessel embolic occlusion.   Electronically Signed   By: Paulina Fusi M.D.   On: 02/11/2014 17:35   Dg Chest 2 View  02/11/2014   CLINICAL DATA:  Stroke.  Smoker.  EXAM: CHEST  2 VIEW  COMPARISON:  None.  FINDINGS: Lungs are adequately inflated without consolidation or effusion. Cardiomediastinal silhouette is within normal. There is minimal spondylosis of the spine.  IMPRESSION: No active cardiopulmonary disease.   Electronically Signed   By: Elberta Fortis M.D.   On: 02/11/2014 20:30   Ct Head Wo Contrast  02/11/2014   CLINICAL DATA:  Right-sided weakness  EXAM: CT HEAD WITHOUT CONTRAST  TECHNIQUE: Contiguous axial images were obtained from the base of the skull  through the vertex without intravenous contrast.  COMPARISON:  Multiple previous CT and MR exams. Most recent study 01/25/2014.  FINDINGS: The brain does not show any evidence of acute infarction, mass lesion, hemorrhage, hydrocephalus or extra-axial collection. There are chronic small-vessel ischemic changes affecting the cerebral hemispheric deep white matter. On the previous CT, there was a punctate density in the right cerebellum that was not present on the older films or visible today. Presumably this represented a small hemorrhage.  IMPRESSION: No acute finding on today's study.  Chronic small vessel ischemic changes of the cerebral hemispheric white matter.  Punctate density in the right cerebellum seen on 07/24 is no longer visible and presumably represented a  microhemorrhage which was acute at that time.  This case was discussed with Dr. Amada Jupiter at 1709 hr.   Electronically Signed   By: Paulina Fusi M.D.   On: 02/11/2014 19:31   Ct Angio Neck W/cm &/or Wo/cm  02/11/2014   CLINICAL DATA:  Right-sided weakness.  Diabetic.  EXAM: CT ANGIOGRAPHY HEAD AND NECK  TECHNIQUE: Multidetector CT imaging of the head and neck was performed using the standard protocol during bolus administration of intravenous contrast. Multiplanar CT image reconstructions and MIPs were obtained to evaluate the vascular anatomy. Carotid stenosis measurements (when applicable) are obtained utilizing NASCET criteria, using the distal internal carotid diameter as the denominator.  CONTRAST:  50mL OMNIPAQUE IOHEXOL 350 MG/ML SOLN  COMPARISON:  [name] ear earlier same day  FINDINGS: CTA HEAD FINDINGS  Both internal carotid arteries show atherosclerotic disease in the siphon. On the right, there is 30% stenosis in on the left there is 50% stenosis supra clinoid internal carotid arteries are widely patent. Anterior and middle cerebral vessels are patent bilaterally without proximal stenosis or evidence of embolic occlusion.  Both  vertebral arteries are patent, with the left terminating and PICA. The right vertebral artery supplies the basilar. There is 30% stenosis of the proximal basilar artery but no stenosis beyond that. Superior cerebellar and posterior cerebral arteries are patent.  No evidence of acute brain infarction. No evidence of venous occlusion.  Review of the MIP images confirms the above findings.  CTA NECK FINDINGS  Lung apices are clear. There is atherosclerosis of the aorta with unfolding. Branching pattern of the brachiocephalic vessels from the arch is normal without origin stenosis.  Right common carotid artery shows atherosclerosis with wall thickening but no stenosis. There is some atherosclerosis of the carotid bifurcation but no stenosis. The right ICA is tortuous but widely patent to the skullbase.  Left common carotid artery shows atherosclerosis with wall thickening but no stenosis. There is atherosclerosis of the carotid bifurcation but no stenosis. The left cervical ICA shows wall thickening but no stenosis, being patent to the skullbase.  Both vertebral artery origins are widely patent with the right being dominant. Both vertebral arteries are widely patent through the cervical region.  Review of the MIP images confirms the above findings.  IMPRESSION: No carotid bifurcation stenosis. Smooth atherosclerotic thickening of the vascular walls.  Carotid siphon atherosclerosis bilaterally, 30% stenosis on the right and 50% stenosis on the left.  No evidence of large or medium vessel embolic occlusion.   Electronically Signed   By: Paulina Fusi M.D.   On: 02/11/2014 17:35   Mr Brain Wo Contrast  02/11/2014   CLINICAL DATA:  Right-sided weakness.  Diabetes.  EXAM: [name] HEAD WITHOUT CONTRAST  TECHNIQUE: Multiplanar, multiecho pulse sequences of the brain and surrounding structures were obtained without intravenous contrast.  COMPARISON:  [name] 01/25/2014  FINDINGS: Diffusion imaging suggests a 2 mm acute infarction in  the left dorsal brainstem at the junction of the pons and midbrain. This is suggestive but not definite. No FLAIR or T2 signal abnormality in this location. No other sign of acute infarction. FLAIR and gradient imaging show moderate chronic small vessel ischemic changes affecting the deep and subcortical hemispheric white matter. There is no evidence of intracranial hemorrhage. No mass lesion, hydrocephalus or extra-axial collection. Major vessels at the base of the brain show flow.  IMPRESSION: Suggestion of a 2 mm acute infarction at the junction of the pons and midbrain dorsally on the left. This could be  in the region of the trochlear nucleus, MLF or superior cerebellar peduncle.   Electronically Signed   By: Paulina Fusi M.D.   On: 02/11/2014 17:47   Mr Maxine Glenn Head/brain Wo Cm  02/11/2014   CLINICAL DATA:  Right-sided weakness  EXAM: MRA HEAD WITHOUT CONTRAST  TECHNIQUE: Angiographic images of the Circle of Willis were obtained using MRA technique without intravenous contrast.  COMPARISON:  Earlier same day  FINDINGS: Both internal carotid arteries are widely patent into the brain. The anterior and middle cerebral vessels are patent without proximal stenosis, aneurysm or vascular malformation.  Both vertebral arteries are patent. Left vertebral artery terminates in PICA. The right vertebral artery supplies plaque at the and continues on as the basilar. No basilar stenosis. Superior cerebellar and posterior cerebral arteries are patent and normal.  IMPRESSION: Normal intracranial MR angiography of the large and medium size vessels.   Electronically Signed   By: Paulina Fusi M.D.   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 Does not open eyes spontaneously do to blindness, unable to track a light on visual exam. Appreciate mild left-sided  weakness on exam, however, patient has difficulty following commands. Patient made code stroke upon arrival. CT scan does not show any acute hemorrhage, an [name] showed an area suggesting a 2 mm infarction in the left dorsal brainstem. Neurology discussed the option of TPA with the patient and his girlfriend. Patient did not appear mentally capable this time of making an informed decision. Decision-making was deferred to girlfriend, and she not feel comfortable proceeding with TPA.  Per neurology recommendations will give nonsedating medications for patient's headache, as his presentation is atypical, and his [name] lesion may or may not explain his symptoms.  He possibly has complex migraine in addition to his [name] findings. No contributory findings noted in laboratory workup. Patient will be admitted for further management and repeat imaging.  Patient care was discussed with my attending, Dr. Denton Lank.    Gavin Pound, MD 02/12/14 [phone]

## 2014-02-11 NOTE — ED Notes (Addendum)
Per EMS: 43 yo male,initial BP 214/110, recheck at 174/103, 99 NSR/ST,  code stroke: weakness upon standing, at 1530 was sitting on couch and asked gf what symptoms of stroke were, patient then became somewhat semi-responsive and was not answering questions appropriately, was having right sided weakness and numbness, history of previous stroke and blindness.  Hx of neuropathy, hypercholesterolemia, diabetes, blindness, depression, anxiety.

## 2014-02-11 NOTE — Consult Note (Signed)
Neurology Consultation Reason for Consult: Right sided numbness/weakness Referring Physician: Leeann MustSteinl, K  CC: Right sided numbness  History is obtained from:patient, girlfriend  HPI: Miguel Diaz is a 43 y.o. male with a history of DM, neuropathy, previous stroke who presents with acute onset right sided numbness that started around 3:15 PM. He is sitting on the couch with his girlfriend when he developed right-sided numbness. She states that he then became unresponsive for a brief period of time, but began improving in transit with EMS. On arrival here, he still had some right-sided weakness, but this seemed to improve but he continued to have right-sided numbness.  Of note he is blind bilaterally  LKW: 3:15 PM tpa given?: no, mild symptoms, improvement    ROS: A 14 point ROS was performed and is negative except as noted in the HPI.   Past medical history: Blindness  Family History: History of similar  Social History: Lives with girlfriend  Exam: Current vital signs: BP 152/98  Pulse 87  Temp(Src) 98.3 F (36.8 C) (Oral)  Resp 22  SpO2 99% Vital signs in last 24 hours: Temp:  [98.3 F (36.8 C)] 98.3 F (36.8 C) (08/10 1747) Pulse Rate:  [87] 87 (08/10 1746) Resp:  [22] 22 (08/10 1746) BP: (152)/(98) 152/98 mmHg (08/10 1746) SpO2:  [99 %] 99 % (08/10 1746)  General: In bed, NAD CV: Regular rate and rhythm Mental Status: Patient is drowsy initially, but subsequently becomes easier to keep awake. He persistently keeps eyes closed, however. He responds nonchalantly to many questions. He is mildly dysarthric Cranial Nerves: II: Denies site in any visual field. Denies light perception. Pupils are equal, round, and reactive to light.  Discs are difficult to visualize. III,IV, VI: EOMI without ptosis or diploplia. He keeps his eyes downwardly deviated, but is able to move them in all directions upon command. V: Facial sensation is decreased on right VII: Facial  movement is symmetric.  VIII: hearing is intact to voice X: Uvula elevates symmetrically XI: Shoulder shrug is symmetric. XII: tongue is midline without atrophy or fasciculations.  Motor: Tone is normal. Bulk is normal. 4+/5 strength in his right arm, otherwise full-strength throughout. Sensory: Sensation is diminished throughout right side Deep Tendon Reflexes: 2+ and symmetric in the biceps and patellae.  Cerebellar: FNF and HKS are intact bilaterally Gait: Not tested secondary to patient safety concerns.         I have reviewed labs in epic and the results pertinent to this consultation are: CBG 185  I have reviewed the images obtained: [name] brain tiny area of restricted diffusion in the midbrain/pontine junction which could be responsible for right-sided numbness.  Impression: 43 year old male with right-sided numbness secondary to tiny infarction in the setting of diabetes, previous stroke. Given his relatively mild symptoms which appeared to have some improvement after discussion with his girlfriend, the decision to not give t-PA was made and this was discussed with his sister is well.  Recommendations: 1. HgbA1c, fasting lipid panel 2. [name], MRA  of the brain without contrast 3. Frequent neuro checks 4. Echocardiogram 5. Carotid dopplers 6. Prophylactic therapy-Antiplatelet med: Aspirin - dose 325mg  PO or 300mg  PR 7. Risk factor modification 8. Telemetry monitoring 9. PT consult, OT consult, Speech consult    Ritta SlotMcNeill Kirkpatrick, MD [facility] [phone]  If 7pm- 7am, please page neurology on call as listed in AMION.

## 2014-02-11 NOTE — H&P (Signed)
Hospitalist Admission History and Physical  Patient name: Miguel Diaz Medical record number: [id number] Date of birth: 03/13/1971 Age: 43 y.o. Gender: male  Primary Care Provider: [name]  Chief Complaint: CVA   History of Present Illness:This is a 43 y.o. year old male with significant past medical history of obesity, tobacco abuse, HLD, blindness, type 2 DM  presenting with CVA. Per girlfriend, pt with R sided numbness around 3: 15 pm. Girlfriend states that pt was watching television when this occurred. Pt also with transient episode of confusion/unresponsiveness, but GF denies any true LOC. GF denies any previous episodes like this in the past. EMS was subsequently called.  Code stroke was called in the ER. Still with R sided numbness on presentation. tPa was discussed with GF as pt was confused. This was declined. [name] w/o any acute stroke, but f/u [name] indicative of 2mm acute infarct in junction of pons and midbrain on L. Pt admitted for stroke w/u. Started on full dose ASA   Assessment and Plan: Miguel Diaz is a 43 y.o. year old male presenting with CVA   Active Problems:   CVA (cerebral infarction)   1-CVA -proceed down stroke w/u  -MRA, 2D ECHO, risk stratification labs -full dose ASA -? Subclinical encephalopathy-check ammonia     2-DM  -SSI, A1C  3-HLD -check LDL  -cont statin   4-psych  -cont SSRI   5-tobacco abuse  -cessation counseling   FEN/GI: NPO pending bedside swallow eval  Prophylaxis: lovenox  Disposition: pending further evaluation  Code Status:Full Code    Patient Active Problem List   Diagnosis Date Noted  . CVA (cerebral infarction) 02/11/2014   Past Medical History: Past Medical History  Diagnosis Date  . CVA (cerebral infarction)   . Blindness   . Hypercholesteremia   . Depression   . Anxiety   . Stroke     Past Surgical History: Past Surgical History  Procedure Laterality Date  . Eye surgery Left      Social History: History   Social History  . Marital Status: Significant Other    Spouse Name: N/A    Number of Children: N/A  . Years of Education: N/A   Social History Main Topics  . Smoking status: None  . Smokeless tobacco: None  . Alcohol Use: None  . Drug Use: None  . Sexual Activity: None   Other Topics Concern  . None   Social History Narrative  . None    Family History: No family history on file.  Allergies: Allergies  Allergen Reactions  . Penicillins     unknown  . Toradol [Ketorolac Tromethamine]     Hives     Current Facility-Administered Medications  Medication Dose Route Frequency Provider Last Rate Last Dose  .  stroke: mapping our early stages of recovery book   Does not apply Once Doree Albee, MD      . aspirin suppository 300 mg  300 mg Rectal Daily Doree Albee, MD       Or  . aspirin tablet 325 mg  325 mg Oral Daily Doree Albee, MD      . atorvastatin (LIPITOR) tablet 40 mg  40 mg Oral QHS Doree Albee, MD      . enoxaparin (LOVENOX) injection 30 mg  30 mg Subcutaneous Q24H Doree Albee, MD      . FLUoxetine (PROZAC) capsule 20 mg  20 mg Oral Daily Doree Albee, MD      . insulin aspart (  novoLOG) injection 0-9 Units  0-9 Units Subcutaneous 6 times per day Doree Albee, MD      . lisinopril (PRINIVIL,ZESTRIL) tablet 2.5 mg  2.5 mg Oral Daily Doree Albee, MD      . metoCLOPramide (REGLAN) 10 mg in sodium chloride 0.9 % 50 mL injection   Intravenous Once Suzi Roots, MD      . pregabalin (LYRICA) capsule 150 mg  150 mg Oral BID Doree Albee, MD      . senna-docusate (Senokot-S) tablet 1 tablet  1 tablet Oral QHS PRN Doree Albee, MD      . sodium chloride 0.9 % bolus 250 mL  250 mL Intravenous Once Gavin Pound, MD       Current Outpatient Prescriptions  Medication Sig Dispense Refill  . atorvastatin (LIPITOR) 40 MG tablet Take 40 mg by mouth at bedtime.       Marland Kitchen FLUoxetine (PROZAC) 20 MG capsule Take 20 mg by mouth daily.       Marland Kitchen glipiZIDE (GLUCOTROL XL) 10 MG 24 hr tablet Take 10 mg by mouth daily with breakfast.      . ibuprofen (ADVIL,MOTRIN) 600 MG tablet Take 600 mg by mouth every 6 (six) hours as needed for moderate pain.      Marland Kitchen lisinopril (PRINIVIL,ZESTRIL) 2.5 MG tablet Take 2.5 mg by mouth daily.      . metFORMIN (GLUCOPHAGE) 500 MG tablet Take 500 mg by mouth 2 (two) times daily with a meal.      . pregabalin (LYRICA) 150 MG capsule Take 150 mg by mouth 2 (two) times daily.       Review Of Systems: 12 point ROS negative except as noted above in HPI.  Physical Exam: Filed Vitals:   02/11/14 1900  BP: 137/96  Pulse: 84  Temp:   Resp:     General: somnolent, cooperative to exam  HEENT: blind  Heart: S1, S2 normal, no murmur, rub or gallop, regular rate and rhythm Lungs: clear to auscultation, no wheezes or rales and unlabored breathing Abdomen: abdomen is soft without significant tenderness, masses, organomegaly or guarding Extremities: mild R sided weakness Skin:no rashes, no ecchymoses Neurology: R sided weakness, otherwise nonfocal exam   Labs and Imaging: Lab Results  Component Value Date/Time   NA 139 02/11/2014  4:53 PM   K 3.7 02/11/2014  4:53 PM   CL 103 02/11/2014  4:53 PM   CO2 22 02/11/2014  4:48 PM   BUN 4* 02/11/2014  4:53 PM   CREATININE 0.70 02/11/2014  4:53 PM   GLUCOSE 221* 02/11/2014  4:53 PM   Lab Results  Component Value Date   WBC 6.0 02/11/2014   HGB 13.6 02/11/2014   HCT 40.0 02/11/2014   MCV 88.3 02/11/2014   PLT 125* 02/11/2014    Ct Angio Head W/cm &/or Wo Cm  02/11/2014   CLINICAL DATA:  Right-sided weakness.  Diabetic.  EXAM: CT ANGIOGRAPHY HEAD AND NECK  TECHNIQUE: Multidetector CT imaging of the head and neck was performed using the standard protocol during bolus administration of intravenous contrast. Multiplanar CT image reconstructions and MIPs were obtained to evaluate the vascular anatomy. Carotid stenosis measurements (when applicable) are obtained  utilizing NASCET criteria, using the distal internal carotid diameter as the denominator.  CONTRAST:  50mL OMNIPAQUE IOHEXOL 350 MG/ML SOLN  COMPARISON:  [name] ear earlier same day  FINDINGS: CTA HEAD FINDINGS  Both internal carotid arteries show atherosclerotic disease in the siphon. On the right, there is 30%  stenosis in on the left there is 50% stenosis supra clinoid internal carotid arteries are widely patent. Anterior and middle cerebral vessels are patent bilaterally without proximal stenosis or evidence of embolic occlusion.  Both vertebral arteries are patent, with the left terminating and PICA. The right vertebral artery supplies the basilar. There is 30% stenosis of the proximal basilar artery but no stenosis beyond that. Superior cerebellar and posterior cerebral arteries are patent.  No evidence of acute brain infarction. No evidence of venous occlusion.  Review of the MIP images confirms the above findings.  CTA NECK FINDINGS  Lung apices are clear. There is atherosclerosis of the aorta with unfolding. Branching pattern of the brachiocephalic vessels from the arch is normal without origin stenosis.  Right common carotid artery shows atherosclerosis with wall thickening but no stenosis. There is some atherosclerosis of the carotid bifurcation but no stenosis. The right ICA is tortuous but widely patent to the skullbase.  Left common carotid artery shows atherosclerosis with wall thickening but no stenosis. There is atherosclerosis of the carotid bifurcation but no stenosis. The left cervical ICA shows wall thickening but no stenosis, being patent to the skullbase.  Both vertebral artery origins are widely patent with the right being dominant. Both vertebral arteries are widely patent through the cervical region.  Review of the MIP images confirms the above findings.  IMPRESSION: No carotid bifurcation stenosis. Smooth atherosclerotic thickening of the vascular walls.  Carotid siphon atherosclerosis  bilaterally, 30% stenosis on the right and 50% stenosis on the left.  No evidence of large or medium vessel embolic occlusion.   Electronically Signed   By: Mark  Shogry M.D.   On: 02/11/2014 17:35   Ct Head Wo Contrast  02/11/2014   CLINICAL DATA:  Right-sided weakness  EXAM: CT HEAD WITHOUT CONTRAST  TECHNIQUE: Contiguous axial images were obtained from the base of the skull through the vertex without intravenous contrast.  COMPARISON:  Multiple previous CT and MR exams. Most recent study 01/25/2014.  FINDINGS: The brain does not show any evidence of acute infarction, mass lesion, hemorrhage, hydrocephalus or extra-axial collection. There are chronic small-vessel ischemic changes affecting the cerebral hemispheric deep white matter. On the previous CT, there was a punctate density in the right cerebellum that was not present on the older films or visible today. Presumably this represented a small hemorrhage.  IMPRESSION: No acute finding on today's study.  Chronic small vessel ischemic changes of the cerebral hemispheric white matter.  Punctate density in the right cerebellum seen on 07/24 is no longer visible and presumably represented a microhemorrhage which was acute at that time.  This case was discussed with Dr. Kirkpatrick at 1709 hr.   Electronically Signed   By: Mark  Shogry M.D.   On: 02/11/2014 19:31   Ct Angio Neck W/cm &/or Wo/cm  02/11/2014   CLINICAL DATA:  Right-sided weakness.  Diabetic.  EXAM: CT ANGIOGRAPHY HEAD AND NECK  TECHNIQUE: Multidetector CT imaging of the head and neck was performed using the standard protocol during bolus administration of intravenous contrast. Multiplanar CT image reconstructions and MIPs were obtained to evaluate the vascular anatomy. Carotid stenosis measurements (when applicable) are obtained utilizing NASCET criteria, using the distal internal carotid diameter as the denominator.  CONTRAST:  48m[facility] Maryla51mdOchs[facility]-B[facility] RougMarylan6meHonolulu Spine CenteMarylan22mrKindred Hospital New Jersey At Wayne HospitaMarylandlL OMNIPAQUE IOHEXOL 350 MG/ML SOLN  COMPARISON:  [name] ear  earlier same day  FINDINGS: CTA HEAD FINDINGS  Both internal carotid arteries show atherosclerotic disease in the siphon. On the right, there is 30% stenosis in on  the left there is 50% stenosis supra clinoid internal carotid arteries are widely patent. Anterior and middle cerebral vessels are patent bilaterally without proximal stenosis or evidence of embolic occlusion.  Both vertebral arteries are patent, with the left terminating and PICA. The right vertebral artery supplies the basilar. There is 30% stenosis of the proximal basilar artery but no stenosis beyond that. Superior cerebellar and posterior cerebral arteries are patent.  No evidence of acute brain infarction. No evidence of venous occlusion.  Review of the MIP images confirms the above findings.  CTA NECK FINDINGS  Lung apices are clear. There is atherosclerosis of the aorta with unfolding. Branching pattern of the brachiocephalic vessels from the arch is normal without origin stenosis.  Right common carotid artery shows atherosclerosis with wall thickening but no stenosis. There is some atherosclerosis of the carotid bifurcation but no stenosis. The right ICA is tortuous but widely patent to the skullbase.  Left common carotid artery shows atherosclerosis with wall thickening but no stenosis. There is atherosclerosis of the carotid bifurcation but no stenosis. The left cervical ICA shows wall thickening but no stenosis, being patent to the skullbase.  Both vertebral artery origins are widely patent with the right being dominant. Both vertebral arteries are widely patent through the cervical region.  Review of the MIP images confirms the above findings.  IMPRESSION: No carotid bifurcation stenosis. Smooth atherosclerotic thickening of the vascular walls.  Carotid siphon atherosclerosis bilaterally, 30% stenosis on the right and 50% stenosis on the left.  No evidence of large or medium vessel embolic occlusion.   Electronically Signed   By: Paulina FusiMark  Shogry  M.D.   On: 02/11/2014 17:35   Mr Brain Wo Contrast  02/11/2014   CLINICAL DATA:  Right-sided weakness.  Diabetes.  EXAM: [name] HEAD WITHOUT CONTRAST  TECHNIQUE: Multiplanar, multiecho pulse sequences of the brain and surrounding structures were obtained without intravenous contrast.  COMPARISON:  [name] 01/25/2014  FINDINGS: Diffusion imaging suggests a 2 mm acute infarction in the left dorsal brainstem at the junction of the pons and midbrain. This is suggestive but not definite. No FLAIR or T2 signal abnormality in this location. No other sign of acute infarction. FLAIR and gradient imaging show moderate chronic small vessel ischemic changes affecting the deep and subcortical hemispheric white matter. There is no evidence of intracranial hemorrhage. No mass lesion, hydrocephalus or extra-axial collection. Major vessels at the base of the brain show flow.  IMPRESSION: Suggestion of a 2 mm acute infarction at the junction of the pons and midbrain dorsally on the left. This could be in the region of the trochlear nucleus, MLF or superior cerebellar peduncle.   Electronically Signed   By: Paulina FusiMark  Shogry M.D.   On: 02/11/2014 17:47           Doree AlbeeSteven Frida Wahlstrom MD  Pager: [phone]

## 2014-02-11 NOTE — ED Notes (Addendum)
Dr. Amada JupiterKirkpatrick at bedside explaining the option of tPA with patient and girlfriend.  Dr. Amada JupiterKirkpatrick explaining the possible risks and benefits with patient.

## 2014-02-11 NOTE — ED Notes (Signed)
Report called to Daphine, RN

## 2014-02-11 NOTE — ED Notes (Signed)
Patient not in room at this time. In radiology.

## 2014-02-11 NOTE — ED Notes (Signed)
I Stat Lactic Acid results shown to Dr. Leeann MustK. Steinl

## 2014-02-12 DIAGNOSIS — F17200 Nicotine dependence, unspecified, uncomplicated: Secondary | ICD-10-CM

## 2014-02-12 DIAGNOSIS — Z720 Tobacco use: Secondary | ICD-10-CM | POA: Diagnosis present

## 2014-02-12 DIAGNOSIS — E1159 Type 2 diabetes mellitus with other circulatory complications: Secondary | ICD-10-CM | POA: Diagnosis present

## 2014-02-12 LAB — GLUCOSE, CAPILLARY
GLUCOSE-CAPILLARY: 100 mg/dL — AB (ref 70–99)
GLUCOSE-CAPILLARY: 179 mg/dL — AB (ref 70–99)
Glucose-Capillary: 199 mg/dL — ABNORMAL HIGH (ref 70–99)
Glucose-Capillary: 154 mg/dL — ABNORMAL HIGH (ref 70–99)
Glucose-Capillary: 213 mg/dL — ABNORMAL HIGH (ref 70–99)

## 2014-02-12 LAB — TROPONIN I
Troponin I: <0.3 ng/mL (ref <0.30)
Troponin I: <0.3 ng/mL (ref <0.30)

## 2014-02-12 LAB — HEMOGLOBIN A1C
HEMOGLOBIN A1C: 8.6 % — AB (ref <5.7)
Mean Plasma Glucose: 200 mg/dL — ABNORMAL HIGH (ref <117)

## 2014-02-12 LAB — LIPID PANEL
CHOLESTEROL: 147 mg/dL (ref 0–200)
HDL: 33 mg/dL — AB (ref >39)
LDL Cholesterol: 78 mg/dL (ref 0–99)
TRIGLYCERIDES: 179 mg/dL — AB (ref <150)
Total CHOL/HDL Ratio: 4.5 RATIO
VLDL: 36 mg/dL (ref 0–40)

## 2014-02-12 MED ORDER — STUDY - INVESTIGATIONAL DRUG SIMPLE RECORD
180.0000 mg | Freq: Once | Status: DC
  Filled 2014-02-12: qty 180

## 2014-02-12 MED ORDER — STUDY - INVESTIGATIONAL DRUG SIMPLE RECORD
90.0000 mg | Freq: Two times a day (BID) | Status: DC
  Administered 2014-02-12 – 2014-02-13 (×2): 90 mg via ORAL
  Filled 2014-02-12 (×4): qty 90

## 2014-02-12 MED ORDER — INSULIN DETEMIR 100 UNIT/ML ~~LOC~~ SOLN
15.0000 [IU] | Freq: Two times a day (BID) | SUBCUTANEOUS | Status: DC
  Administered 2014-02-12 – 2014-02-13 (×3): 15 [IU] via SUBCUTANEOUS
  Filled 2014-02-12 (×4): qty 0.15

## 2014-02-12 MED ORDER — PREGABALIN 75 MG PO CAPS
150.0000 mg | ORAL_CAPSULE | Freq: Three times a day (TID) | ORAL | Status: DC
  Administered 2014-02-12 – 2014-02-13 (×3): 150 mg via ORAL
  Filled 2014-02-12 (×3): qty 2

## 2014-02-12 MED ORDER — STUDY - INVESTIGATIONAL DRUG SIMPLE RECORD
100.0000 mg | Freq: Every day | Status: DC
  Administered 2014-02-13: 100 mg via ORAL
  Filled 2014-02-12 (×2): qty 100

## 2014-02-12 MED ORDER — STUDY - INVESTIGATIONAL DRUG SIMPLE RECORD
300.0000 mg | Freq: Once | Status: DC
  Filled 2014-02-12: qty 300

## 2014-02-12 NOTE — Care Management Note (Addendum)
  Page 2 of 2   02/13/2014     10:13:59 AM CARE MANAGEMENT NOTE 02/13/2014  Patient:  FOUNT, BAHE   Account Number:  [id number]  Date Initiated:  02/12/2014  Documentation initiated by:  Lorne Skeens  Subjective/Objective Assessment:   Patient was admitted with CVA. Lives at home with significant other.  Recently hospitalized as observation 01/25/14-01/27/14 for syncope.     Action/Plan:   Will follow for discharge needs pending PT/OT evals and physician orders.   Anticipated DC Date:  02/13/2014   Anticipated DC Plan:  [place]  In-house referral  Financial Counselor      DC Planning Services  CM consult      Choice offered to / List presented to:  C-1 Patient   DME arranged  TUB BENCH      DME agency  [place] arranged  [place].   Status of service:  Completed, signed off Medicare Important Message given?   (If response is "NO", the following Medicare IM given date fields will be blank) Date Medicare IM given:   Medicare IM given by:   Date Additional Medicare IM given:   Additional Medicare IM given by:    Discharge Disposition:  [place]  Per UR Regulation:  Reviewed for med. necessity/level of care/duration of stay  If discussed at [place] of Stay Meetings, dates discussed:    Comments:  02/13/14 [place], MSN, CM- Met with patient and significant other to discuss home health needs. Patient is agreeable to home health therapy. Mary with [facility] was notified and has accepted referral. Patient is aware that financial paperwork will need to be done with [facility] prior to discharge home today.  [facility] was also notified of need for tub bench prior to discharge.  Private duty agency list was provided, as patient's significant other has concerns about needing further assistance at home. Patient's nephew will be providing supervision for the first week  after discharge while significant other is working.  Per patient, he has no difficulty obtaining his medications as his PCP office has a sliding scale program in which he is enrolled. Patient is also connected with services for the blind.   02/12/14  [place], MSN, CM- Met with patient and significant other to discuss discharge needs. Per significant other, patient has been seen by Holli Humbles with financial counseling on recent observation stay and is currently working on [employer] paperwork.  Patient DOES have a PCP. He follows at [facility]. CM will continue to follow for additional discharge needs.

## 2014-02-12 NOTE — Progress Notes (Signed)
UR complete.  Hillman Attig RN, MSN 

## 2014-02-12 NOTE — Evaluation (Addendum)
Occupational Therapy Evaluation Patient Details Name: Miguel Diaz Kohl MRN: [id number] DOB: 08-07-1970 Today's Date: 02/12/2014    History of Present Illness This is a 43 y.o. year old male with significant past medical history of obesity, tobacco abuse, HLD, blindness, type 2 DM  presenting with CVA. [name] revealed acute infarction at the junction of the pons  and midbrain dorsally on the left.   Clinical Impression   Pt s/p above. Pt independent with ADLs, PTA. Feel pt will benefit from acute OT to increase independence prior to d/c. Recommending HHOT upon d/c.     Follow Up Recommendations  Home health OT;Supervision/Assistance - 24 hour    Equipment Recommendations  Tub/shower bench    Recommendations for Other Services       Precautions / Restrictions Precautions Precautions: Fall Restrictions Weight Bearing Restrictions: No      Mobility Bed Mobility               General bed mobility comments: not assessed  Transfers Overall transfer level: Needs assistance   Transfers: Sit to/from Stand Sit to Stand: Min assist;Min guard;Supervision         General transfer comment: intially Min A for sit to stand from chair to steady.    Balance                                            ADL Overall ADL's : Needs assistance/impaired     Grooming: Wash/dry face;Oral care;Brushing hair;Set up;Supervision/safety;Standing               Lower Body Dressing: Min guard;Sit to/from stand   Toilet Transfer: Minimal assistance;Ambulation;Comfort height toilet;Grab bars   Toileting- Clothing Manipulation and Hygiene: Min guard;Sit to/from stand       Functional mobility during ADLs: Min guard;Minimal assistance;Cane (ambulated with cane and without cane ) General ADL Comments: Educated on safety with RUE at home (avoiding hot surfaces/sharp objects). Discussed activities he could be doing with Rt hand to work on fine motor coordination.  Recommended pt to quit smoking as it increases chance for stroke. Ambulated with and without cane. Encouraged pt to be using Rt hand during activities.  Briefly talked about options for shower chair. Recommended sitting for most of LB ADLs.      Vision                     Perception     Praxis      Pertinent Vitals/Pain Pain Assessment: No/denies pain     Hand Dominance Right   Extremity/Trunk Assessment Upper Extremity Assessment Upper Extremity Assessment: RUE deficits/detail RUE Deficits / Details: grossly 3+/5 RUE Sensation: decreased light touch RUE Coordination: decreased fine motor   Lower Extremity Assessment Lower Extremity Assessment: Defer to PT evaluation (RLE decreased sensation)   Cervical / Trunk Assessment Cervical / Trunk Assessment: Normal   Communication Communication Communication: No difficulties   Cognition Arousal/Alertness: Awake/alert Behavior During Therapy: WFL for tasks assessed/performed Overall Cognitive Status: Within Functional Limits for tasks assessed                     General Comments       Exercises       Shoulder Instructions      Home Living Family/patient expects to be discharged to:: Private residence Living Arrangements: Spouse/significant other Available Help at Discharge: Family;Available 24 hours/day (  for approximately 1 week) Type of Home: House Home Access: Stairs to enter [employer] of Steps: 2 Entrance Stairs-Rails: None Home Layout: One level     Bathroom Shower/Tub: Chief Strategy Officer: Standard (sink close)     Home Equipment: Cane - single point          Prior Functioning/Environment Level of Independence: Independent with assistive device(s)             OT Diagnosis: Other (comment) (Right hemiparesis)   OT Problem List: Decreased strength;Impaired balance (sitting and/or standing);Impaired vision/perception;Decreased coordination;Decreased  knowledge of use of DME or AE;Decreased knowledge of precautions;Impaired sensation   OT Treatment/Interventions: Self-care/ADL training;Neuromuscular education;Therapeutic exercise;DME and/or AE instruction;Therapeutic activities;Visual/perceptual remediation/compensation;Patient/family education;Balance training    OT Goals(Current goals can be found in the care plan section) Acute Rehab OT Goals Patient Stated Goal: not stated OT Goal Formulation: With patient Time For Goal Achievement: 02/19/14 Potential to Achieve Goals: Good  OT Frequency: Min 2X/week   Barriers to D/C:            Co-evaluation              End of Session Equipment Utilized During Treatment: Gait belt;Other (comment) (cane)  Activity Tolerance: Patient tolerated treatment well Patient left: in chair;with call bell/phone within reach;with chair alarm set;with family/visitor present   Time: 1191-4782 OT Time Calculation (min): 26 min Charges:  OT General Charges $OT Visit: 1 Procedure OT Evaluation $Initial OT Evaluation Tier I: 1 Procedure OT Treatments $Self care/Home management: 8-22 mins G-Codes:    Earlie Raveling OTR/L [phone] 02/12/2014, 11:05 AM

## 2014-02-12 NOTE — Evaluation (Signed)
Physical Therapy Evaluation Patient Details Name: Miguel Diaz MRN: [id number] DOB: [birth year] Today's Date: 02/12/2014   History of Present Illness  43 y.o. male admitted to [facility] on 02/11/14 with right sided numbness and weakness.  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  Pt with significant PMhx of blindness, DM, anxiety/depression, and peripherial neuropathy.   Clinical Impression  Pt is mobilizing well despite his new right sided weakness.  He is at risk for falls and would benefit from f/u HHPT at discharge.  He does have a girlfriend and his nephew who can provide 24/7 care the first week at home.   PT to follow acutely for deficits listed below.       Follow Up Recommendations Home health PT;Supervision/Assistance - 24 hour (24/7 assist x 1 week)    Equipment Recommendations  None recommended by PT    Recommendations for Other Services    NA   Precautions / Restrictions Precautions Precautions: Fall Precaution Comments: blind and R leg weakness      Mobility  Bed Mobility Overal bed mobility: Modified Independent             General bed mobility comments: pt sat up with the use of the bed rail for leverage  Transfers Overall transfer level: Needs assistance Equipment used: Straight cane Transfers: Sit to/from Stand Sit to Stand: Min guard         General transfer comment: Min guard assist for safety upon first standing.   Ambulation/Gait Ambulation/Gait assistance: Min assist Ambulation Distance (Feet): 80 Feet Assistive device: Straight cane Gait Pattern/deviations: Step-through pattern;Decreased stance time - right;Decreased step length - right;Decreased dorsiflexion - right;Decreased weight shift to right Gait velocity: decreased Gait velocity interpretation: Below normal speed for age/gender General Gait Details: Pt with cautious gait reporting that he feels his right leg weakness.  He did not drag  his foot to the point of safety concerns and he did not show any signs of buckling over the right leg.  Girlfriend reports he is normally much more steady and walks faster at baseline.    Modified Rankin (Stroke Patients Only) Pre-Morbid Rankin Score: Slight disability Modified Rankin: Moderately severe disability     Balance Overall balance assessment: Needs assistance Sitting-balance support: Feet supported;No upper extremity supported Sitting balance-Leahy Scale: Good     Standing balance support: Single extremity supported Standing balance-Leahy Scale: Fair Standing balance comment: can stand statically EOB without support, but with dynamic motions needs external assist.                              Pertinent Vitals/Pain Pain Assessment: No/denies pain    Home Living Family/patient expects to be discharged to:: Private residence Living Arrangements: Spouse/significant other (girlfriend of 13 year) Available Help at Discharge: Family;Available 24 hours/day (for approximately 1 week) Type of Home: House Home Access: Stairs to enter Entrance Stairs-Rails: None Entrance Stairs-Number of Steps: 2 Home Layout: One level Home Equipment: Cane - single point (wooden hook cane, will be getting tapping cane for the blind)      Prior Function Level of Independence: Independent with assistive device(s)         Comments: Independent with his cane at home with all ADLs and self care.  He does not cook or do houswork.      Hand Dominance   Dominant Hand: Right    Extremity/Trunk Assessment  Upper Extremity Assessment: Defer to OT evaluation           Lower Extremity Assessment: RLE deficits/detail RLE Deficits / Details: decreased strength 3+/5 throughout in right leg.    Cervical / Trunk Assessment: Normal  Communication   Communication: No difficulties  Cognition Arousal/Alertness: Awake/alert Behavior During Therapy: WFL for tasks  assessed/performed Overall Cognitive Status: Within Functional Limits for tasks assessed (not formally assessed.)                                    Assessment/Plan    PT Assessment Patient needs continued PT services  PT Diagnosis Difficulty walking;Abnormality of gait;Generalized weakness;Hemiplegia dominant side   PT Problem List Decreased strength;Decreased activity tolerance;Decreased mobility;Decreased balance;Decreased knowledge of use of DME;Obesity  PT Treatment Interventions DME instruction;Gait training;Stair training;Functional mobility training;Therapeutic activities;Therapeutic exercise;Balance training;Neuromuscular re-education;Patient/family education   PT Goals (Current goals can be found in the Care Plan section) Acute Rehab PT Goals Patient Stated Goal: to get back to his normal baseline PT Goal Formulation: With patient/family Time For Goal Achievement: 02/26/14 Potential to Achieve Goals: Good    Frequency Min 4X/week    End of Session Equipment Utilized During Treatment: Gait belt Activity Tolerance: Patient tolerated treatment well Patient left: in chair;with call bell/phone within reach;with chair alarm set;with family/visitor present           Time: 1453-1520 PT Time Calculation (min): 27 min   Charges:   PT Evaluation $Initial PT Evaluation Tier I: 1 Procedure PT Treatments $Gait Training: 8-22 mins     Jamillia Closson B. Bradely Rudin, PT, DPT [phone]   02/12/2014, 5:23 PM

## 2014-02-12 NOTE — Progress Notes (Signed)
[facility] PROGRESS NOTE  Assessment/Plan: CVA (cerebral infarction) - HgbA1c pending, fasting lipid panel HDL< 40, LDL < 100 on lipitor at home. - [name], MRA of the brain without contrast as below - PT consult, OT consult, Speech consult pending - Echocardiogram, Carotid dopplers pending were done on 7.25 &7.25. 2015 on a previous admission on the name Miguel Diaz. D. Echo showed grade 2 diastolic heart failure with an ejection fraction of 60%. Mildly calcified aortic valve. Carotid Doppler showed mild bilateral ICA stenosis 39% with soft plaque anterograde low of the vertebral arteries. - Prophylactic therapy-Antiplatelet med: We'll start aspirin and Palmer per protocol - Avoid D5 fluids as may be harmfull - risk factor modification  - Cardiac Monitoring with no events. - Neurochecks q4h  - tobacco counseling cessation.  Type II or unspecified type diabetes mellitus with peripheral circulatory disorders, uncontrolled(250.72) - A1c pending. - Continue sliding scale insulin.  Tobacco abuse - See above.      Disposition: pending further evaluation  Code Status:Full Code   Consultants:  neurology  Procedures: [name] 8.10.2015: Suggestion of a 2 mm acute infarction at the junction of the pons and midbrain dorsally on the left  Antibiotics:  None  HPI/Subjective: No complaints  Objective: Filed Vitals:   02/12/14 0300 02/12/14 0500 02/12/14 0700 02/12/14 1000  BP: 109/73 108/86 102/70 108/72  Pulse: 74 71 84 68  Temp: 97.9 F (36.6 C) 98.1 F (36.7 C) 98.1 F (36.7 C) 97.7 F (36.5 C)  TempSrc: Oral Oral Oral Oral  Resp: 16 17 18 20   Height:      Weight:      SpO2: 94% 95% 96% 94%    Intake/Output Summary (Last 24 hours) at 02/12/14 1024 Last data filed at 02/11/14 1820  Gross per 24 hour  Intake      0 ml  Output    500 ml  Net   -500 ml   Filed Weights   02/11/14 1650 02/11/14 2105  Weight: 87.181 kg (192 lb 3.2 oz) 85.412 kg  (188 lb 4.8 oz)    Exam:  General: Alert, awake, oriented x3, in no acute distress.  HEENT: No bruits, no goiter.  Heart: Regular rate and rhythm. Lungs: Good air movement, clear Abdomen: Soft, nontender, nondistended, positive bowel sounds.  Neuro: Grossly intact, nonfocal.   Data Reviewed: Basic Metabolic Panel:  Recent Labs Lab 02/11/14 1648 02/11/14 1653 02/11/14 2034  NA 139 139  --   K 3.9 3.7  --   CL 102 103  --   CO2 22  --   --   GLUCOSE 215* 221*  --   BUN 6 4*  --   CREATININE 0.63 0.70 0.62  CALCIUM 8.9  --   --    Liver Function Tests:  Recent Labs Lab 02/11/14 1648  AST 23  ALT 34  ALKPHOS 108  BILITOT 0.2*  PROT 6.5  ALBUMIN 3.7   No results found for this basename: LIPASE, AMYLASE,  in the last 168 hours  Recent Labs Lab 02/11/14 2034  AMMONIA 24   CBC:  Recent Labs Lab 02/11/14 1648 02/11/14 1653 02/11/14 2034  WBC 6.0  --  6.7  NEUTROABS 3.3  --   --   HGB 13.7 13.6 14.0  HCT 38.6* 40.0 39.8  MCV 88.3  --  88.8  PLT 125*  --  138*   Cardiac Enzymes:  Recent Labs Lab 02/11/14 2034 02/12/14 0117 02/12/14 0635  TROPONINI <0.30 <0.30 <  0.30   BNP (last 3 results) No results found for this basename: PROBNP,  in the last 8760 hours CBG:  Recent Labs Lab 02/11/14 1741 02/11/14 2154 02/11/14 2350 02/12/14 0403 02/12/14 0840  GLUCAP 185* 126* 172* 100* 199*    No results found for this or any previous visit (from the past 240 hour(s)).   Studies: Ct Angio Head W/cm &/or Wo Cm  02/11/2014   CLINICAL DATA:  Right-sided weakness.  Diabetic.  EXAM: CT ANGIOGRAPHY HEAD AND NECK  TECHNIQUE: Multidetector CT imaging of the head and neck was performed using the standard protocol during bolus administration of intravenous contrast. Multiplanar CT image reconstructions and MIPs were obtained to evaluate the vascular anatomy. Carotid stenosis measurements (when applicable) are obtained utilizing NASCET criteria, using the distal  internal carotid diameter as the denominator.  CONTRAST:  50mL OMNIPAQUE IOHEXOL 350 MG/ML SOLN  COMPARISON:  [name] ear earlier same day  FINDINGS: CTA HEAD FINDINGS  Both internal carotid arteries show atherosclerotic disease in the siphon. On the right, there is 30% stenosis in on the left there is 50% stenosis supra clinoid internal carotid arteries are widely patent. Anterior and middle cerebral vessels are patent bilaterally without proximal stenosis or evidence of embolic occlusion.  Both vertebral arteries are patent, with the left terminating and PICA. The right vertebral artery supplies the basilar. There is 30% stenosis of the proximal basilar artery but no stenosis beyond that. Superior cerebellar and posterior cerebral arteries are patent.  No evidence of acute brain infarction. No evidence of venous occlusion.  Review of the MIP images confirms the above findings.  CTA NECK FINDINGS  Lung apices are clear. There is atherosclerosis of the aorta with unfolding. Branching pattern of the brachiocephalic vessels from the arch is normal without origin stenosis.  Right common carotid artery shows atherosclerosis with wall thickening but no stenosis. There is some atherosclerosis of the carotid bifurcation but no stenosis. The right ICA is tortuous but widely patent to the skullbase.  Left common carotid artery shows atherosclerosis with wall thickening but no stenosis. There is atherosclerosis of the carotid bifurcation but no stenosis. The left cervical ICA shows wall thickening but no stenosis, being patent to the skullbase.  Both vertebral artery origins are widely patent with the right being dominant. Both vertebral arteries are widely patent through the cervical region.  Review of the MIP images confirms the above findings.  IMPRESSION: No carotid bifurcation stenosis. Smooth atherosclerotic thickening of the vascular walls.  Carotid siphon atherosclerosis bilaterally, 30% stenosis on the right and 50%  stenosis on the left.  No evidence of large or medium vessel embolic occlusion.   Electronically Signed   By: Paulina Fusi M.D.   On: 02/11/2014 17:35   Dg Chest 2 View  02/11/2014   CLINICAL DATA:  Stroke.  Smoker.  EXAM: CHEST  2 VIEW  COMPARISON:  None.  FINDINGS: Lungs are adequately inflated without consolidation or effusion. Cardiomediastinal silhouette is within normal. There is minimal spondylosis of the spine.  IMPRESSION: No active cardiopulmonary disease.   Electronically Signed   By: Elberta Fortis M.D.   On: 02/11/2014 20:30   Ct Head Wo Contrast  02/11/2014   CLINICAL DATA:  Right-sided weakness  EXAM: CT HEAD WITHOUT CONTRAST  TECHNIQUE: Contiguous axial images were obtained from the base of the skull through the vertex without intravenous contrast.  COMPARISON:  Multiple previous CT and MR exams. Most recent study 01/25/2014.  FINDINGS: The brain does not  show any evidence of acute infarction, mass lesion, hemorrhage, hydrocephalus or extra-axial collection. There are chronic small-vessel ischemic changes affecting the cerebral hemispheric deep white matter. On the previous CT, there was a punctate density in the right cerebellum that was not present on the older films or visible today. Presumably this represented a small hemorrhage.  IMPRESSION: No acute finding on today's study.  Chronic small vessel ischemic changes of the cerebral hemispheric white matter.  Punctate density in the right cerebellum seen on 07/24 is no longer visible and presumably represented a microhemorrhage which was acute at that time.  This case was discussed with Dr. Amada Jupiter at 1709 hr.   Electronically Signed   By: Paulina Fusi M.D.   On: 02/11/2014 19:31   Ct Angio Neck W/cm &/or Wo/cm  02/11/2014   CLINICAL DATA:  Right-sided weakness.  Diabetic.  EXAM: CT ANGIOGRAPHY HEAD AND NECK  TECHNIQUE: Multidetector CT imaging of the head and neck was performed using the standard protocol during bolus administration of  intravenous contrast. Multiplanar CT image reconstructions and MIPs were obtained to evaluate the vascular anatomy. Carotid stenosis measurements (when applicable) are obtained utilizing NASCET criteria, using the distal internal carotid diameter as the denominator.  CONTRAST:  50mL OMNIPAQUE IOHEXOL 350 MG/ML SOLN  COMPARISON:  [name] ear earlier same day  FINDINGS: CTA HEAD FINDINGS  Both internal carotid arteries show atherosclerotic disease in the siphon. On the right, there is 30% stenosis in on the left there is 50% stenosis supra clinoid internal carotid arteries are widely patent. Anterior and middle cerebral vessels are patent bilaterally without proximal stenosis or evidence of embolic occlusion.  Both vertebral arteries are patent, with the left terminating and PICA. The right vertebral artery supplies the basilar. There is 30% stenosis of the proximal basilar artery but no stenosis beyond that. Superior cerebellar and posterior cerebral arteries are patent.  No evidence of acute brain infarction. No evidence of venous occlusion.  Review of the MIP images confirms the above findings.  CTA NECK FINDINGS  Lung apices are clear. There is atherosclerosis of the aorta with unfolding. Branching pattern of the brachiocephalic vessels from the arch is normal without origin stenosis.  Right common carotid artery shows atherosclerosis with wall thickening but no stenosis. There is some atherosclerosis of the carotid bifurcation but no stenosis. The right ICA is tortuous but widely patent to the skullbase.  Left common carotid artery shows atherosclerosis with wall thickening but no stenosis. There is atherosclerosis of the carotid bifurcation but no stenosis. The left cervical ICA shows wall thickening but no stenosis, being patent to the skullbase.  Both vertebral artery origins are widely patent with the right being dominant. Both vertebral arteries are widely patent through the cervical region.  Review of the  MIP images confirms the above findings.  IMPRESSION: No carotid bifurcation stenosis. Smooth atherosclerotic thickening of the vascular walls.  Carotid siphon atherosclerosis bilaterally, 30% stenosis on the right and 50% stenosis on the left.  No evidence of large or medium vessel embolic occlusion.   Electronically Signed   By: Paulina Fusi M.D.   On: 02/11/2014 17:35   Mr Brain Wo Contrast  02/11/2014   CLINICAL DATA:  Right-sided weakness.  Diabetes.  EXAM: [name] HEAD WITHOUT CONTRAST  TECHNIQUE: Multiplanar, multiecho pulse sequences of the brain and surrounding structures were obtained without intravenous contrast.  COMPARISON:  [name] 01/25/2014  FINDINGS: Diffusion imaging suggests a 2 mm acute infarction in the left dorsal brainstem at the  junction of the pons and midbrain. This is suggestive but not definite. No FLAIR or T2 signal abnormality in this location. No other sign of acute infarction. FLAIR and gradient imaging show moderate chronic small vessel ischemic changes affecting the deep and subcortical hemispheric white matter. There is no evidence of intracranial hemorrhage. No mass lesion, hydrocephalus or extra-axial collection. Major vessels at the base of the brain show flow.  IMPRESSION: Suggestion of a 2 mm acute infarction at the junction of the pons and midbrain dorsally on the left. This could be in the region of the trochlear nucleus, MLF or superior cerebellar peduncle.   Electronically Signed   By: Paulina Fusi M.D.   On: 02/11/2014 17:47   Mr Maxine Glenn Head/brain Wo Cm  02/11/2014   CLINICAL DATA:  Right-sided weakness  EXAM: MRA HEAD WITHOUT CONTRAST  TECHNIQUE: Angiographic images of the Circle of Willis were obtained using MRA technique without intravenous contrast.  COMPARISON:  Earlier same day  FINDINGS: Both internal carotid arteries are widely patent into the brain. The anterior and middle cerebral vessels are patent without proximal stenosis, aneurysm or vascular malformation.  Both  vertebral arteries are patent. Left vertebral artery terminates in PICA. The right vertebral artery supplies plaque at the and continues on as the basilar. No basilar stenosis. Superior cerebellar and posterior cerebral arteries are patent and normal.  IMPRESSION: Normal intracranial MR angiography of the large and medium size vessels.   Electronically Signed   By: Paulina Fusi M.D.   On: 02/11/2014 20:45    Scheduled Meds: . aspirin  300 mg Rectal Daily   Or  . aspirin  325 mg Oral Daily  . atorvastatin  40 mg Oral QHS  . enoxaparin (LOVENOX) injection  30 mg Subcutaneous Q24H  . FLUoxetine  20 mg Oral Daily  . insulin aspart  0-9 Units Subcutaneous 6 times per day  . lisinopril  2.5 mg Oral Daily  . small volume/piggyback builder   Intravenous Once  . pregabalin  150 mg Oral BID  . sodium chloride  250 mL Intravenous Once   Continuous Infusions:    Marinda Elk  [facility] Pager [phone]. If 8PM-8AM, please contact night-coverage at [email or web address], password [facility] 02/12/2014, 10:24 AM  LOS: 1 day    **Disclaimer: This note may have been dictated with voice recognition software. Similar sounding words can inadvertently be transcribed and this note may contain transcription errors which may not have been corrected upon publication of note.**

## 2014-02-12 NOTE — Progress Notes (Signed)
Stroke Team Progress Note  HISTORY Miguel Diaz is a 43 y.o. male with a history of DM, neuropathy, previous stroke who presents with acute onset right sided numbness that started around 3:15 PM on 02/11/14. He was sitting on the couch with his girlfriend when he developed right-sided numbness and weakness. She states that he then became unresponsive for a brief period of time, but began improving in transit with EMS. On arrival here, he still had some right-sided weakness, but this seemed to improve but he continued to have right-sided numbness.  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  SUBJECTIVE His wife is at the bedside.  Overall he feels his condition is gradually improving. He still has some right-sided numbness though his weakness has improved. He is prior history of strokes but was not on any antiplatelet therapy OBJECTIVE Most recent Vital Signs: Filed Vitals:   02/12/14 0300 02/12/14 0500 02/12/14 0700 02/12/14 1000  BP: 109/73 108/86 102/70 108/72  Pulse: 74 71 84 68  Temp: 97.9 F (36.6 C) 98.1 F (36.7 C) 98.1 F (36.7 C) 97.7 F (36.5 C)  TempSrc: Oral Oral Oral Oral  Resp: 16 17 18 20   Height:      Weight:      SpO2: 94% 95% 96% 94%   CBG (last 3)   Recent Labs  02/11/14 2350 02/12/14 0403 02/12/14 0840  GLUCAP 172* 100* 199*    IV Fluid Intake:     MEDICATIONS  . aspirin  300 mg Rectal Daily   Or  . aspirin  325 mg Oral Daily  . atorvastatin  40 mg Oral QHS  . enoxaparin (LOVENOX) injection  30 mg Subcutaneous Q24H  . FLUoxetine  20 mg Oral Daily  . insulin aspart  0-9 Units Subcutaneous 6 times per day  . insulin detemir  15 Units Subcutaneous BID  . lisinopril  2.5 mg Oral Daily  . small volume/piggyback builder   Intravenous Once  . pregabalin  150 mg Oral BID  . sodium chloride  250 mL Intravenous Once    PRN:  senna-docusate  Diet:  Carb Control   Activity:  Bedrest  DVT Prophylaxis:  SCDs  CLINICALLY SIGNIFICANT STUDIES Basic Metabolic Panel:  Recent Labs Lab 02/11/14 1648 02/11/14 1653 02/11/14 2034  NA 139 139  --   K 3.9 3.7  --   CL 102 103  --   CO2 22  --   --   GLUCOSE 215* 221*  --   BUN 6 4*  --   CREATININE 0.63 0.70 0.62  CALCIUM 8.9  --   --    Liver Function Tests:  Recent Labs Lab 02/11/14 1648  AST 23  ALT 34  ALKPHOS 108  BILITOT 0.2*  PROT 6.5  ALBUMIN 3.7   CBC:  Recent Labs Lab 02/11/14 1648 02/11/14 1653 02/11/14 2034  WBC 6.0  --  6.7  NEUTROABS 3.3  --   --   HGB 13.7 13.6 14.0  HCT 38.6* 40.0 39.8  MCV 88.3  --  88.8  PLT 125*  --  138*   Coagulation:  Recent Labs Lab 02/11/14 1648  LABPROT 12.3  INR 0.91   Cardiac Enzymes:  Recent Labs Lab 02/11/14 2034 02/12/14 0117 02/12/14 0635  TROPONINI <0.30 <0.30 <0.30   Urinalysis:  Recent Labs Lab 02/11/14 1820  COLORURINE YELLOW  LABSPEC 1.026  PHURINE 7.0  GLUCOSEU 500*  HGBUR NEGATIVE  BILIRUBINUR NEGATIVE  KETONESUR NEGATIVE  PROTEINUR NEGATIVE  UROBILINOGEN 0.2  NITRITE NEGATIVE  LEUKOCYTESUR NEGATIVE   Lipid Panel    Component Value Date/Time   CHOL 147 02/12/2014 0635   TRIG 179* 02/12/2014 0635   HDL 33* 02/12/2014 0635   CHOLHDL 4.5 02/12/2014 0635   VLDL 36 02/12/2014 0635   LDLCALC 78 02/12/2014 0635   HgbA1C  No results found for this basename: HGBA1C    Urine Drug Screen:     Component Value Date/Time   LABOPIA NONE DETECTED 02/11/2014 1820   COCAINSCRNUR NONE DETECTED 02/11/2014 1820   LABBENZ NONE DETECTED 02/11/2014 1820   AMPHETMU NONE DETECTED 02/11/2014 1820   THCU NONE DETECTED 02/11/2014 1820   LABBARB NONE DETECTED 02/11/2014 1820    Alcohol Level:  Recent Labs Lab 02/11/14 1648  ETH <11    Ct Angio Head W/cm &/or Wo Cm  02/11/2014   CLINICAL DATA:  Right-sided weakness.  Diabetic.  EXAM: CT ANGIOGRAPHY HEAD AND NECK  TECHNIQUE:  Multidetector CT imaging of the head and neck was performed using the standard protocol during bolus administration of intravenous contrast. Multiplanar CT image reconstructions and MIPs were obtained to evaluate the vascular anatomy. Carotid stenosis measurements (when applicable) are obtained utilizing NASCET criteria, using the distal internal carotid diameter as the denominator.  CONTRAST:  50mL OMNIPAQUE IOHEXOL 350 MG/ML SOLN  COMPARISON:  [name] ear earlier same day  FINDINGS: CTA HEAD FINDINGS  Both internal carotid arteries show atherosclerotic disease in the siphon. On the right, there is 30% stenosis in on the left there is 50% stenosis supra clinoid internal carotid arteries are widely patent. Anterior and middle cerebral vessels are patent bilaterally without proximal stenosis or evidence of embolic occlusion.  Both vertebral arteries are patent, with the left terminating and PICA. The right vertebral artery supplies the basilar. There is 30% stenosis of the proximal basilar artery but no stenosis beyond that. Superior cerebellar and posterior cerebral arteries are patent.  No evidence of acute brain infarction. No evidence of venous occlusion.  Review of the MIP images confirms the above findings.  CTA NECK FINDINGS  Lung apices are clear. There is atherosclerosis of the aorta with unfolding. Branching pattern of the brachiocephalic vessels from the arch is normal without origin stenosis.  Right common carotid artery shows atherosclerosis with wall thickening but no stenosis. There is some atherosclerosis of the carotid bifurcation but no stenosis. The right ICA is tortuous but widely patent to the skullbase.  Left common carotid artery shows atherosclerosis with wall thickening but no stenosis. There is atherosclerosis of the carotid bifurcation but no stenosis. The left cervical ICA shows wall thickening but no stenosis, being patent to the skullbase.  Both vertebral artery origins are widely patent  with the right being dominant. Both vertebral arteries are widely patent through the cervical region.  Review of the MIP images confirms the above findings.  IMPRESSION: No carotid bifurcation stenosis. Smooth atherosclerotic thickening of the vascular walls.  Carotid siphon atherosclerosis bilaterally, 30% stenosis on the right and 50% stenosis on the left.  No evidence of large or medium vessel embolic occlusion.   Electronically Signed   By: Paulina Fusi M.D.   On: 02/11/2014 17:35   Dg Chest 2 View  02/11/2014   CLINICAL DATA:  Stroke.  Smoker.  EXAM: CHEST  2 VIEW  COMPARISON:  None.  FINDINGS: Lungs are  adequately inflated without consolidation or effusion. Cardiomediastinal silhouette is within normal. There is minimal spondylosis of the spine.  IMPRESSION: No active cardiopulmonary disease.   Electronically Signed   By: Elberta Fortisaniel  Boyle M.D.   On: 02/11/2014 20:30   Ct Head Wo Contrast  02/11/2014   CLINICAL DATA:  Right-sided weakness  EXAM: CT HEAD WITHOUT CONTRAST  TECHNIQUE: Contiguous axial images were obtained from the base of the skull through the vertex without intravenous contrast.  COMPARISON:  Multiple previous CT and MR exams. Most recent study 01/25/2014.  FINDINGS: The brain does not show any evidence of acute infarction, mass lesion, hemorrhage, hydrocephalus or extra-axial collection. There are chronic small-vessel ischemic changes affecting the cerebral hemispheric deep white matter. On the previous CT, there was a punctate density in the right cerebellum that was not present on the older films or visible today. Presumably this represented a small hemorrhage.  IMPRESSION: No acute finding on today's study.  Chronic small vessel ischemic changes of the cerebral hemispheric white matter.  Punctate density in the right cerebellum seen on 07/24 is no longer visible and presumably represented a microhemorrhage which was acute at that time.  This case was discussed with Dr. Amada JupiterKirkpatrick at 1709  hr.   Electronically Signed   By: Paulina FusiMark  Shogry M.D.   On: 02/11/2014 19:31   Ct Angio Neck W/cm &/or Wo/cm  02/11/2014   CLINICAL DATA:  Right-sided weakness.  Diabetic.  EXAM: CT ANGIOGRAPHY HEAD AND NECK  TECHNIQUE: Multidetector CT imaging of the head and neck was performed using the standard protocol during bolus administration of intravenous contrast. Multiplanar CT image reconstructions and MIPs were obtained to evaluate the vascular anatomy. Carotid stenosis measurements (when applicable) are obtained utilizing NASCET criteria, using the distal internal carotid diameter as the denominator.  CONTRAST:  50mL OMNIPAQUE IOHEXOL 350 MG/ML SOLN  COMPARISON:  [name] ear earlier same day  FINDINGS: CTA HEAD FINDINGS  Both internal carotid arteries show atherosclerotic disease in the siphon. On the right, there is 30% stenosis in on the left there is 50% stenosis supra clinoid internal carotid arteries are widely patent. Anterior and middle cerebral vessels are patent bilaterally without proximal stenosis or evidence of embolic occlusion.  Both vertebral arteries are patent, with the left terminating and PICA. The right vertebral artery supplies the basilar. There is 30% stenosis of the proximal basilar artery but no stenosis beyond that. Superior cerebellar and posterior cerebral arteries are patent.  No evidence of acute brain infarction. No evidence of venous occlusion.  Review of the MIP images confirms the above findings.  CTA NECK FINDINGS  Lung apices are clear. There is atherosclerosis of the aorta with unfolding. Branching pattern of the brachiocephalic vessels from the arch is normal without origin stenosis.  Right common carotid artery shows atherosclerosis with wall thickening but no stenosis. There is some atherosclerosis of the carotid bifurcation but no stenosis. The right ICA is tortuous but widely patent to the skullbase.  Left common carotid artery shows atherosclerosis with wall thickening but  no stenosis. There is atherosclerosis of the carotid bifurcation but no stenosis. The left cervical ICA shows wall thickening but no stenosis, being patent to the skullbase.  Both vertebral artery origins are widely patent with the right being dominant. Both vertebral arteries are widely patent through the cervical region.  Review of the MIP images confirms the above findings.  IMPRESSION: No carotid bifurcation stenosis. Smooth atherosclerotic thickening of the vascular walls.  Carotid siphon atherosclerosis bilaterally,  30% stenosis on the right and 50% stenosis on the left.  No evidence of large or medium vessel embolic occlusion.   Electronically Signed   By: Paulina Fusi M.D.   On: 02/11/2014 17:35   Mr Brain Wo Contrast  02/11/2014   CLINICAL DATA:  Right-sided weakness.  Diabetes.  EXAM: [name] HEAD WITHOUT CONTRAST  TECHNIQUE: Multiplanar, multiecho pulse sequences of the brain and surrounding structures were obtained without intravenous contrast.  COMPARISON:  [name] 01/25/2014  FINDINGS: Diffusion imaging suggests a 2 mm acute infarction in the left dorsal brainstem at the junction of the pons and midbrain. This is suggestive but not definite. No FLAIR or T2 signal abnormality in this location. No other sign of acute infarction. FLAIR and gradient imaging show moderate chronic small vessel ischemic changes affecting the deep and subcortical hemispheric white matter. There is no evidence of intracranial hemorrhage. No mass lesion, hydrocephalus or extra-axial collection. Major vessels at the base of the brain show flow.  IMPRESSION: Suggestion of a 2 mm acute infarction at the junction of the pons and midbrain dorsally on the left. This could be in the region of the trochlear nucleus, MLF or superior cerebellar peduncle.   Electronically Signed   By: Paulina Fusi M.D.   On: 02/11/2014 17:47   Mr Maxine Glenn Head/brain Wo Cm  02/11/2014   CLINICAL DATA:  Right-sided weakness  EXAM: MRA HEAD WITHOUT CONTRAST   TECHNIQUE: Angiographic images of the Circle of Willis were obtained using MRA technique without intravenous contrast.  COMPARISON:  Earlier same day  FINDINGS: Both internal carotid arteries are widely patent into the brain. The anterior and middle cerebral vessels are patent without proximal stenosis, aneurysm or vascular malformation.  Both vertebral arteries are patent. Left vertebral artery terminates in PICA. The right vertebral artery supplies plaque at the and continues on as the basilar. No basilar stenosis. Superior cerebellar and posterior cerebral arteries are patent and normal.  IMPRESSION: Normal intracranial MR angiography of the large and medium size vessels.   Electronically Signed   By: Paulina Fusi M.D.   On: 02/11/2014 20:45           Carotid Doppler  See CTA  2D Echocardiogram   Done last month  CXR  no active cardiopulmonary disease  EKG  sinus rhythm.  Therapy Recommendations  pending  Physical Exam  pleasant young Caucasian male currently not in distress.Awake alert. Afebrile. Head is nontraumatic. Neck is supple without bruit. Hearing is normal. Cardiac exam no murmur or gallop. Lungs are clear to auscultation. Distal pulses are well felt. Neurological Exam : Patient is awake but is sitting with eyes closed. He has some difficulty opening his eyes. He is legally blind in both eyes and denies light perception. Extraocular movements are full range without nystagmus. Fundi were not visualized. Face is symmetric without weakness. There is subjective decreased sensation on the right lower half of the face. Tongue is midline. Cough and gag intact. Motor system exam revealed no upper or lower extremity drift. Mild weakness of the right grip and intrinsic hand muscles. Orbits lefty over right upper extremity. Mild weakness of right grip. Sensation is diminished throughout on the right side. Coordination is slow but accurate. Gait was not tested. Deep tendon pulses are 2+  symmetric ASSESSMENT Mr. Miguel Diaz is a 43 y.o. male presenting with right-sided weakness and numbness likely from a small left brain subcortical infarct. Etiology likely small vessel disease. [name] scan of the brain shows a very  questionable diffusion abnormality at the left want to medullary junction which I am not sure and is a definite infarct or can explain patient's symptoms On no antiplatelets prior to admission. Now on aspirin for secondary stroke prevention. Patient with resultant mild residual right sided numbness.Stroke work up underway.   Bilateral blindness  LDL 78 mg% on Lipitor 40 mg PTA  HT on Prinivil PTA  Diabetes on Metformin & Glipizide PTA   Hospital day # 1  TREATMENT/PLAN  Continue Aspirin    for secondary stroke prevention.  Strict control of diabetes with hemoglobin A1c goal below   6.5%, lipids with LDL cholesterol goal below 70 mg percent and hypertension with blood pressure goal below 130/90.  Patient had recent stroke workup last month hence will not do further evaluation.  Patient remains at 10-15% risk for recurrent strokes/TIAs with a maximum risk in the first 3 months. I had a long discussion with the patient and wife regarding his recurrent strokes, need for antiplatelet therapy and aggressive risk factor control.   The patient may benefit possible consideration for participation in the SOCRATEs trial  ( Aspirin versus Ticagrelor) for stroke prevention if interested.   Delia Heady, MD       To contact Stroke Continuity provider, please refer to [email or web address]. After hours, contact General Neurology

## 2014-02-12 NOTE — Evaluation (Signed)
Speech Language Pathology Evaluation Patient Details Name: Miguel Diaz MRN: [id number] DOB: 12-Nov-1970 Today's Date: 02/12/2014 Time: 1520-1540 SLP Time Calculation (min): 20 min  Problem List:  Patient Active Problem List   Diagnosis Date Noted  . Type II or unspecified type diabetes mellitus with peripheral circulatory disorders, uncontrolled(250.72) 02/12/2014  . Tobacco abuse 02/12/2014  . CVA (cerebral infarction) 02/11/2014   Past Medical History:  Past Medical History  Diagnosis Date  . CVA (cerebral infarction)   . Blindness   . Hypercholesteremia   . Depression   . Anxiety   . Stroke    Past Surgical History:  Past Surgical History  Procedure Laterality Date  . Eye surgery Left    HPI:  This is a 43 y.o. year old male with significant past medical history of obesity, tobacco abuse, HLD, blindness, type 2 DM  presenting with CVA. [name] revealed acute infarction at the junction of the pons  and midbrain dorsally on the left.    Assessment / Plan / Recommendation Clinical Impression  Patient presents with functional cognitive-linguistic skills.  Patient demonstrates right-sided sensory changes; however, motor is intact and verbal expression is not impacted.  Patient demonstrates decreased recall of CVA event, but otherwise intact recall of current events.  As a result, no skilled SLP services are needed at this time.  Patient and girlfriend in agreement.      SLP Assessment  Patient does not need any further Speech Lanaguage Pathology Services    Follow Up Recommendations  Other (comment) (defer to OT/PT)            Pertinent Vitals/Pain Pain Assessment: No/denies pain    SLP Evaluation Prior Functioning  Cognitive/Linguistic Baseline: Within functional limits Type of Home: House  Lives With: Significant other;Family Available Help at Discharge: Family;Available 24 hours/day   Cognition  Overall Cognitive Status: Within Functional Limits for tasks  assessed Arousal/Alertness: Awake/alert Orientation Level: Oriented X4 Memory: Appears intact Safety/Judgment: Appears intact    Comprehension  Auditory Comprehension Overall Auditory Comprehension: Appears within functional limits for tasks assessed Visual Recognition/Discrimination Discrimination: Not tested (due to visual deficits) Reading Comprehension Reading Status: Not tested (due to visual deficits)    Expression Expression Primary Mode of Expression: Verbal Verbal Expression Overall Verbal Expression: Appears within functional limits for tasks assessed Written Expression Dominant Hand: Right Written Expression: Not tested (not asssess due to visual deficits)   Oral / Motor Oral Motor/Sensory Function Overall Oral Motor/Sensory Function: Impaired (impaired sensory; WFL motor) Labial Sensation: Reduced Facial Sensation: Reduced Motor Speech Overall Motor Speech: Appears within functional limits for tasks assessed   GO    Miguel FerrettiMelissa Don Diaz, M.A., CCC-SLP [phone]  Miguel Diaz 02/12/2014, 4:55 PM

## 2014-02-13 DIAGNOSIS — F3289 Other specified depressive episodes: Secondary | ICD-10-CM

## 2014-02-13 DIAGNOSIS — I10 Essential (primary) hypertension: Secondary | ICD-10-CM

## 2014-02-13 DIAGNOSIS — F329 Major depressive disorder, single episode, unspecified: Secondary | ICD-10-CM

## 2014-02-13 LAB — GLUCOSE, CAPILLARY
Glucose-Capillary: 150 mg/dL — ABNORMAL HIGH (ref 70–99)
Glucose-Capillary: 189 mg/dL — ABNORMAL HIGH (ref 70–99)
Glucose-Capillary: 224 mg/dL — ABNORMAL HIGH (ref 70–99)
Glucose-Capillary: 96 mg/dL (ref 70–99)

## 2014-02-13 MED ORDER — STUDY - INVESTIGATIONAL DRUG SIMPLE RECORD: 180.0000 mg | Freq: Once | Status: DC

## 2014-02-13 MED ORDER — STUDY - INVESTIGATIONAL DRUG SIMPLE RECORD: 90.0000 mg | Freq: Two times a day (BID) | Status: DC

## 2014-02-13 NOTE — Progress Notes (Signed)
Study medications obtained from pharmacy. Per neurology, pt is cleared to be discharged and will take the rest of the study medication at home.

## 2014-02-13 NOTE — Discharge Summary (Signed)
Physician Discharge Summary  Miguel Diaz ZOX:[id number] DOB: July 15, 1970 DOA: 02/11/2014  PCP: [name]  Admit date: 02/11/2014 Discharge date: 02/13/2014  Time spent: 45 minutes  Recommendations for Outpatient Follow-up:  Patient will be discharged home. Patient will need to follow up with his primary care physician within one week of discharge. Patient also to followup with neurology, Dr. Roda Diaz, within 4 weeks.  Patient should continue his medications as prescribed. Patient will be discharged with home health, physical therapy and occupational therapy. Patient should follow a car modified diet.  Discharge Diagnoses:  Acute CVA Diabetes mellitus, type II Tobacco abuse  Discharge Condition: Stable  Diet recommendation: Carb modified  Filed Weights   02/11/14 1650 02/11/14 2105  Weight: 87.181 kg (192 lb 3.2 oz) 85.412 kg (188 lb 4.8 oz)    History of present illness:  On 02/11/2014 This is a 43 y.o. year old male with significant past medical history of obesity, tobacco abuse, hyperlipidemia, blindness, type 2 DM presenting with CVA. Per girlfriend, patient with R sided numbness around 3: 15 pm on the day of admission. Girlfriend stated that patient was watching television when this occurred. Patient also had a  transient episode of confusion/unresponsiveness, but no loss of consciousness. EMS subsequently called.  Code stroke was called in the ER. Upon admission, patient still had right-sided numbnes. tPa was discussed with the plan as patient was confused and was declined. [name] did not show any acute stroke, but [name] indicative of 2mm acute infarct in junction of pons and midbrain. Patient admitted for stroke workup. Started on full dose ASA   Hospital Course:  Acute CVA  -[name] brain: 2mm acute infarct in junction of pons and midbrain -MRA brain:Normal intracranial MR angiography of the large and medium size vessels. -Echocardiogram on 01/26/14:Grade 2 diastolic heart failure  with an ejection fraction of 60%. Mildly calcified aortic valve. -Carotid Doppler on 01/26/2014: mild bilateral ICA stenosis 39% with soft plaque anterograde low of the vertebral arteries.  -HbA1c 8.6 and LDL 78  -PT/OT recommend home health, tub/shower bench -Neurology consulted and appreciated, patient has been enrolled in the SOCRATEs trial (aspirin vs Ticagrelor) for stroke prevention -Patient will need to follow up with Dr. Roda Diaz in 4 weeks  Type 2 diabetes mellitus -Hemoglobin A1c 8.6 (goal < 6.5) -Continue metformin and glipizide. (held while hospitalized) -patient will need to follow up with PCP for better management of diabetes  Hyperlipidemia -Continue Lipitor  Hypertension -Continue lisinopril  Depression -Continue Prozac  Tobacco abuse -Patient counseled against smoking  Procedures: Echocardiogram and carotid doppler done prior to admission  Consultations: Neurology  Discharge Exam: Filed Vitals:   02/13/14 1025  BP: 118/68  Pulse: 72  Temp: 97.4 F (36.3 C)  Resp: 20     General: Well developed, well nourished, NAD, appears stated age  HEENT: NCAT, PERRLA, EOMI, Anicteic Sclera, mucous membranes moist.  Neck: Supple, no JVD, no masses  Cardiovascular: S1 S2 auscultated, no rubs, murmurs or gallops. Regular rate and rhythm.  Respiratory: Clear to auscultation bilaterally with equal chest rise  Abdomen: Soft, nontender, nondistended, + bowel sounds  Extremities: warm dry without cyanosis clubbing or edema  Neuro: AAOx3, Strength 4/5 in RUE, 5/5 in LLE, RLE, LUE.   Skin: Without rashes exudates or nodules  Psych: Normal affect and demeanor with intact judgement and insight  Discharge Instructions      Discharge Instructions   Discharge instructions    Complete by:  As directed   Patient will  be discharged home. Patient will need to follow up with his primary care physician within one week of discharge. Patient also to followup with neurology,  Dr. Roda ShuttersXu, within 4 weeks.  Patient should continue his medications as prescribed. Patient will be discharged with home health, physical therapy and occupational therapy. Patient should follow a car modified diet.            Medication List         atorvastatin 40 MG tablet  Commonly known as:  LIPITOR  Take 40 mg by mouth at bedtime.     FLUoxetine 20 MG capsule  Commonly known as:  PROZAC  Take 20 mg by mouth daily.     glipiZIDE 10 MG 24 hr tablet  Commonly known as:  GLUCOTROL XL  Take 10 mg by mouth daily with breakfast.     ibuprofen 600 MG tablet  Commonly known as:  ADVIL,MOTRIN  Take 600 mg by mouth every 6 (six) hours as needed for moderate pain.     lisinopril 2.5 MG tablet  Commonly known as:  PRINIVIL,ZESTRIL  Take 2.5 mg by mouth daily.     metFORMIN 500 MG tablet  Commonly known as:  GLUCOPHAGE  Take 500 mg by mouth 2 (two) times daily with a meal.     pregabalin 150 MG capsule  Commonly known as:  LYRICA  Take 150 mg by mouth 2 (two) times daily.     research study medication  Take 90 mg by mouth 2 (two) times daily.     research study medication  Take 180 mg by mouth once.       Allergies  Allergen Reactions  . Penicillins     unknown  . Toradol [Ketorolac Tromethamine]     Hives    Follow-up Information   Follow up with Primary care physician. Schedule an appointment as soon as possible for a visit in 1 week. [facility](Hospital follow up, diabetes management)       Follow up with Xu,Jindong, MD In 4 weeks. [facility](Hospital followup, stroke clinic)    Specialty:  Neurology   Contact information:   [address] [place] [id number] [phone]        The results of significant diagnostics from this hospitalization (including imaging, microbiology, ancillary and laboratory) are listed below for reference.    Significant Diagnostic Studies: Ct Angio Head W/cm &/or Wo Cm  02/11/2014   CLINICAL DATA:  Right-sided weakness.  Diabetic.   EXAM: CT ANGIOGRAPHY HEAD AND NECK  TECHNIQUE: Multidetector CT imaging of the head and neck was performed using the standard protocol during bolus administration of intravenous contrast. Multiplanar CT image reconstructions and MIPs were obtained to evaluate the vascular anatomy. Carotid stenosis measurements (when applicable) are obtained utilizing NASCET criteria, using the distal internal carotid diameter as the denominator.  CONTRAST:  50mL OMNIPAQUE IOHEXOL 350 MG/ML SOLN  COMPARISON:  [name] ear earlier same day  FINDINGS: CTA HEAD FINDINGS  Both internal carotid arteries show atherosclerotic disease in the siphon. On the right, there is 30% stenosis in on the left there is 50% stenosis supra clinoid internal carotid arteries are widely patent. Anterior and middle cerebral vessels are patent bilaterally without proximal stenosis or evidence of embolic occlusion.  Both vertebral arteries are patent, with the left terminating and PICA. The right vertebral artery supplies the basilar. There is 30% stenosis of the proximal basilar artery but no stenosis beyond that. Superior cerebellar and posterior cerebral arteries are patent.  No evidence of acute brain infarction. No evidence of venous occlusion.  Review of the MIP images confirms the above findings.  CTA NECK FINDINGS  Lung apices are clear. There is atherosclerosis of the aorta with unfolding. Branching pattern of the brachiocephalic vessels from the arch is normal without origin stenosis.  Right common carotid artery shows atherosclerosis with wall thickening but no stenosis. There is some atherosclerosis of the carotid bifurcation but no stenosis. The right ICA is tortuous but widely patent to the skullbase.  Left common carotid artery shows atherosclerosis with wall thickening but no stenosis. There is atherosclerosis of the carotid bifurcation but no stenosis. The left cervical ICA shows wall thickening but no stenosis, being patent to the skullbase.   Both vertebral artery origins are widely patent with the right being dominant. Both vertebral arteries are widely patent through the cervical region.  Review of the MIP images confirms the above findings.  IMPRESSION: No carotid bifurcation stenosis. Smooth atherosclerotic thickening of the vascular walls.  Carotid siphon atherosclerosis bilaterally, 30% stenosis on the right and 50% stenosis on the left.  No evidence of large or medium vessel embolic occlusion.   Electronically Signed   By: Paulina Fusi M.D.   On: 02/11/2014 17:35   Dg Chest 2 View  02/11/2014   CLINICAL DATA:  Stroke.  Smoker.  EXAM: CHEST  2 VIEW  COMPARISON:  None.  FINDINGS: Lungs are adequately inflated without consolidation or effusion. Cardiomediastinal silhouette is within normal. There is minimal spondylosis of the spine.  IMPRESSION: No active cardiopulmonary disease.   Electronically Signed   By: Elberta Fortis M.D.   On: 02/11/2014 20:30   Ct Head Wo Contrast  02/11/2014   CLINICAL DATA:  Right-sided weakness  EXAM: CT HEAD WITHOUT CONTRAST  TECHNIQUE: Contiguous axial images were obtained from the base of the skull through the vertex without intravenous contrast.  COMPARISON:  Multiple previous CT and MR exams. Most recent study 01/25/2014.  FINDINGS: The brain does not show any evidence of acute infarction, mass lesion, hemorrhage, hydrocephalus or extra-axial collection. There are chronic small-vessel ischemic changes affecting the cerebral hemispheric deep white matter. On the previous CT, there was a punctate density in the right cerebellum that was not present on the older films or visible today. Presumably this represented a small hemorrhage.  IMPRESSION: No acute finding on today's study.  Chronic small vessel ischemic changes of the cerebral hemispheric white matter.  Punctate density in the right cerebellum seen on 07/24 is no longer visible and presumably represented a microhemorrhage which was acute at that time.  This  case was discussed with Dr. Amada Jupiter at 1709 hr.   Electronically Signed   By: Paulina Fusi M.D.   On: 02/11/2014 19:31   Ct Angio Neck W/cm &/or Wo/cm  02/11/2014   CLINICAL DATA:  Right-sided weakness.  Diabetic.  EXAM: CT ANGIOGRAPHY HEAD AND NECK  TECHNIQUE: Multidetector CT imaging of the head and neck was performed using the standard protocol during bolus administration of intravenous contrast. Multiplanar CT image reconstructions and MIPs were obtained to evaluate the vascular anatomy. Carotid stenosis measurements (when applicable) are obtained utilizing NASCET criteria, using the distal internal carotid diameter as the denominator.  CONTRAST:  50mL OMNIPAQUE IOHEXOL 350 MG/ML SOLN  COMPARISON:  [name] ear earlier same day  FINDINGS: CTA HEAD FINDINGS  Both internal carotid arteries show atherosclerotic disease in the siphon. On the right, there is 30% stenosis in on the left there is 50%  stenosis supra clinoid internal carotid arteries are widely patent. Anterior and middle cerebral vessels are patent bilaterally without proximal stenosis or evidence of embolic occlusion.  Both vertebral arteries are patent, with the left terminating and PICA. The right vertebral artery supplies the basilar. There is 30% stenosis of the proximal basilar artery but no stenosis beyond that. Superior cerebellar and posterior cerebral arteries are patent.  No evidence of acute brain infarction. No evidence of venous occlusion.  Review of the MIP images confirms the above findings.  CTA NECK FINDINGS  Lung apices are clear. There is atherosclerosis of the aorta with unfolding. Branching pattern of the brachiocephalic vessels from the arch is normal without origin stenosis.  Right common carotid artery shows atherosclerosis with wall thickening but no stenosis. There is some atherosclerosis of the carotid bifurcation but no stenosis. The right ICA is tortuous but widely patent to the skullbase.  Left common carotid artery  shows atherosclerosis with wall thickening but no stenosis. There is atherosclerosis of the carotid bifurcation but no stenosis. The left cervical ICA shows wall thickening but no stenosis, being patent to the skullbase.  Both vertebral artery origins are widely patent with the right being dominant. Both vertebral arteries are widely patent through the cervical region.  Review of the MIP images confirms the above findings.  IMPRESSION: No carotid bifurcation stenosis. Smooth atherosclerotic thickening of the vascular walls.  Carotid siphon atherosclerosis bilaterally, 30% stenosis on the right and 50% stenosis on the left.  No evidence of large or medium vessel embolic occlusion.   Electronically Signed   By: Paulina Fusi M.D.   On: 02/11/2014 17:35   Mr Brain Wo Contrast  02/11/2014   CLINICAL DATA:  Right-sided weakness.  Diabetes.  EXAM: [name] HEAD WITHOUT CONTRAST  TECHNIQUE: Multiplanar, multiecho pulse sequences of the brain and surrounding structures were obtained without intravenous contrast.  COMPARISON:  [name] 01/25/2014  FINDINGS: Diffusion imaging suggests a 2 mm acute infarction in the left dorsal brainstem at the junction of the pons and midbrain. This is suggestive but not definite. No FLAIR or T2 signal abnormality in this location. No other sign of acute infarction. FLAIR and gradient imaging show moderate chronic small vessel ischemic changes affecting the deep and subcortical hemispheric white matter. There is no evidence of intracranial hemorrhage. No mass lesion, hydrocephalus or extra-axial collection. Major vessels at the base of the brain show flow.  IMPRESSION: Suggestion of a 2 mm acute infarction at the junction of the pons and midbrain dorsally on the left. This could be in the region of the trochlear nucleus, MLF or superior cerebellar peduncle.   Electronically Signed   By: Paulina Fusi M.D.   On: 02/11/2014 17:47   Mr Maxine Glenn Head/brain Wo Cm  02/11/2014   CLINICAL DATA:  Right-sided  weakness  EXAM: MRA HEAD WITHOUT CONTRAST  TECHNIQUE: Angiographic images of the Circle of Willis were obtained using MRA technique without intravenous contrast.  COMPARISON:  Earlier same day  FINDINGS: Both internal carotid arteries are widely patent into the brain. The anterior and middle cerebral vessels are patent without proximal stenosis, aneurysm or vascular malformation.  Both vertebral arteries are patent. Left vertebral artery terminates in PICA. The right vertebral artery supplies plaque at the and continues on as the basilar. No basilar stenosis. Superior cerebellar and posterior cerebral arteries are patent and normal.  IMPRESSION: Normal intracranial MR angiography of the large and medium size vessels.   Electronically Signed   By: Paulina Fusi  M.D.   On: 02/11/2014 20:45    Microbiology: No results found for this or any previous visit (from the past 240 hour(s)).   Labs: Basic Metabolic Panel:  Recent Labs Lab 02/11/14 1648 02/11/14 1653 02/11/14 2034  NA 139 139  --   K 3.9 3.7  --   CL 102 103  --   CO2 22  --   --   GLUCOSE 215* 221*  --   BUN 6 4*  --   CREATININE 0.63 0.70 0.62  CALCIUM 8.9  --   --    Liver Function Tests:  Recent Labs Lab 02/11/14 1648  AST 23  ALT 34  ALKPHOS 108  BILITOT 0.2*  PROT 6.5  ALBUMIN 3.7   No results found for this basename: LIPASE, AMYLASE,  in the last 168 hours  Recent Labs Lab 02/11/14 2034  AMMONIA 24   CBC:  Recent Labs Lab 02/11/14 1648 02/11/14 1653 02/11/14 2034  WBC 6.0  --  6.7  NEUTROABS 3.3  --   --   HGB 13.7 13.6 14.0  HCT 38.6* 40.0 39.8  MCV 88.3  --  88.8  PLT 125*  --  138*   Cardiac Enzymes:  Recent Labs Lab 02/11/14 2034 02/12/14 0117 02/12/14 0635  TROPONINI <0.30 <0.30 <0.30   BNP: BNP (last 3 results) No results found for this basename: PROBNP,  in the last 8760 hours CBG:  Recent Labs Lab 02/12/14 2037 02/13/14 0017 02/13/14 0405 02/13/14 0839 02/13/14 1128    GLUCAP 179* 150* 96 224* 189*       Signed:  Jesiel Garate  [facility] 02/13/2014, 1:57 PM

## 2014-02-13 NOTE — Progress Notes (Signed)
Occupational Therapy Treatment Patient Details Name: Miguel Diaz MRN: [id number] DOB: 08/06/1970 Today's Date: 02/13/2014    History of present illness 43 y.o. male admitted to [facility] on 02/11/14 with right sided numbness and weakness.  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  Pt with significant PMhx of blindness, DM, anxiety/depression, and peripherial neuropathy.    OT comments  Patient progressing towards OT goals. Patient is eager to go home and plan is for patient to discharge > home today and have follow up HHOT. During therapy session, engaged patient in functional mobility and strengthening exercises in order to increase patient's overall independence with self-care tasks. As stated below under ADL section, patient performed tub bench transfer in/out of tub shower with min guard assist and min verbal/tactile cues secondary to blindness. Therapist educated patient and gf on safety with these transfers and importance of having gf present initially for safety. Therapist also educated patient on theraputty exercises for right hand for functional strengthening. Patient with good carryover of exercises taught. Encouraged patient to perform exercises at least 3X per day.    Follow Up Recommendations  Home health OT    Equipment Recommendations  Tub/shower bench    Recommendations for Other Services  n/a, at this time    Precautions / Restrictions Precautions Precautions: Fall Precaution Comments: blind and R leg weakness Restrictions Weight Bearing Restrictions: No       Mobility Bed Mobility                  Transfers Overall transfer level: Needs assistance Equipment used: Straight cane Transfers: Sit to/from Stand Sit to Stand: Min guard                  ADL Overall ADL's : Needs assistance/impaired                                 Tub/ Shower Transfer: Tub transfer;Min guard;Tub bench;Cueing  for safety (cueing for safety secondary to blind)   Functional mobility during ADLs: Min guard General ADL Comments: Patient feeling better today, but tired from PT session earlier this am. Patient engaged in functional ambulation/mobility from room > exercise room for tub/shower transfer on/off tub transfer bench. Patient min guard for this transfer and requires min verbal/tactile cueing secondary to blindness. Educated patient on transfer and educated gf on safety with transfer, encouraging her to provide assistance when at home.       Vision                 Additional Comments: patient blind          Cognition   Behavior During Therapy: WFL for tasks assessed/performed Overall Cognitive Status: Within Functional Limits for tasks assessed                         Exercises Hand Exercises Opposition: Strengthening;Seated (theraputty) Opposition Limitations:  (weakness)           Pertinent Vitals/ Pain       Pain Assessment: No/denies pain   Progress Toward Goals  OT Goals(current goals can now be found in the care plan section)  Progress towards OT goals: Progressing toward goals     Plan Discharge plan remains appropriate       End of Session Equipment Utilized During Treatment: Gait belt   Activity Tolerance  Patient tolerated treatment well   Patient Left in chair;with chair alarm set;with family/visitor present;with call bell/phone within reach       Time: 1035-1059 OT Time Calculation (min): 24 min  Charges: OT General Charges $OT Visit: 1 Procedure OT Treatments $Therapeutic Activity: 23-37 mins  Caila Cirelli, MS, OTR/L, CLT 02/13/2014, 11:17 AM

## 2014-02-13 NOTE — Progress Notes (Signed)
Discharge orders received. Pt for discharge home today. IV d/c'd. Pt given discharge instructions and research study medications with verbalized understanding. Home health equipment present. Family in room to assist with discharge. Staff brought pt downstairs via wheelchair.

## 2014-02-13 NOTE — Progress Notes (Signed)
Physical Therapy Treatment Patient Details Name: Miguel Diaz MRN: [id number] DOB: [birth year] Today's Date: 02/13/2014    History of Present Illness 43 y.o. male admitted to [facility] on 02/11/14 with right sided numbness and weakness.  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  Pt with significant PMhx of blindness, DM, anxiety/depression, and peripherial neuropathy.     PT Comments    Pt is progressing well with his mobility, however, he does show increased right leg weakness and gait disturbance with increased gait distance. He did shoe the ability with min assist to simulate home entry and will continue to have 24/7 assist at home for the first week.  Educated pt and his wife re: seated HEP and handout given.  Pt continues to be appropriate for HHPT at discharge.   PT to follow acutely for deficits listed below.     Follow Up Recommendations  Home health PT;Supervision/Assistance - 24 hour (24/7 assist x 1 week)     Equipment Recommendations  None recommended by PT    Recommendations for Other Services   NA     Precautions / Restrictions Precautions Precautions: Fall Precaution Comments: blind and R leg weakness Restrictions Weight Bearing Restrictions: No    Mobility   Transfers Overall transfer level: Needs assistance Equipment used: Straight cane Transfers: Sit to/from Stand Sit to Stand: Supervision         General transfer comment: supervision for safety  Ambulation/Gait Ambulation/Gait assistance: Min assist;Min guard Ambulation Distance (Feet): 180 Feet Assistive device: Straight cane Gait Pattern/deviations: Step-through pattern;Decreased step length - right;Decreased stance time - right;Decreased dorsiflexion - right;Decreased weight shift to right Gait velocity: decreased Gait velocity interpretation: Below normal speed for age/gender General Gait Details: Pt with more confident gait today initiailly only  requiring min guard assist for safety, however, second half of gait pt showed signs of significant fatigue in his right leg and needed min assist for balance and stability when WB over right leg in stance phase of gait.  Continued to use cane for an assistive device as I believe the RW would act as an obstacle and increase his risk of falling.     Stairs Stairs: Yes Stairs assistance: Min assist Stair Management: One rail Right;Forwards;Step to pattern;With cane (cane on left rail on right) Number of Stairs: 5 General stair comments: Educated pt and reinforced to girlfriend in the room "up with the strong leg down with the weak leg".  Pt deomonstrated understanding. Min assist for safety on stairs, especially descending as pt tended to descend with poor eccentric control even leading with his right leg.       Modified Rankin (Stroke Patients Only) Modified Rankin (Stroke Patients Only) Pre-Morbid Rankin Score: Slight disability Modified Rankin: Moderately severe disability     Balance Overall balance assessment: Needs assistance Sitting-balance support: Feet supported;No upper extremity supported Sitting balance-Leahy Scale: Good     Standing balance support: Single extremity supported Standing balance-Leahy Scale: Fair Standing balance comment: static standing is fair, needs assist with dynamic movements and gait.                     Cognition Arousal/Alertness: Awake/alert Behavior During Therapy: WFL for tasks assessed/performed Overall Cognitive Status: Within Functional Limits for tasks assessed                      Exercises General Exercises - Lower Extremity Long Arc Quad: AROM;Right;10 reps;Seated (  5 sec hold) Hip ABduction/ADduction: AROM;Both;10 reps;Seated (5 sec hold, adduct only against pillow for resistance. ) Hip Flexion/Marching: AROM;Right;10 reps;Seated Toe Raises: AROM;Right;20 reps;Seated Heel Raises: AROM;Right;20 reps;Seated Hand  Exercises Opposition: Strengthening;Seated (theraputty) Opposition Limitations:  (weakness) Other Exercises Other Exercises: HEP given of general leg exercises listed above.  Reviewed with girlfriend as pt will not be able to see to read handout.         Pertinent Vitals/Pain Pain Assessment: No/denies pain           PT Goals (current goals can now be found in the care plan section) Acute Rehab PT Goals Patient Stated Goal: to get back to his normal baseline Progress towards PT goals: Progressing toward goals    Frequency  Min 4X/week    PT Plan Current plan remains appropriate       End of Session Equipment Utilized During Treatment: Gait belt Activity Tolerance: Patient tolerated treatment well Patient left: in chair;with call bell/phone within reach;with chair alarm set;with family/visitor present     Time: 1610-9604 PT Time Calculation (min): 25 min  Charges:  $Gait Training: 8-22 mins $Therapeutic Exercise: 8-22 mins            Nichoals Heyde B. Macalister Arnaud, PT, DPT [phone]   02/13/2014, 12:47 PM

## 2014-02-13 NOTE — Progress Notes (Signed)
Stroke Team Progress Note  HISTORY Miguel Diaz is a 43 y.o. male with a history of DM, neuropathy, previous stroke who presents with acute onset right sided numbness that started around 3:15 PM on 02/11/14. He was sitting on the couch with his girlfriend when he developed right-sided numbness and weakness. She states that he then became unresponsive for a brief period of time, but began improving in transit with EMS. On arrival here, he still had some right-sided weakness, but this seemed to improve but he continued to have right-sided numbness.  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  SUBJECTIVE His wife is at the bedside.  Overall he feels his condition is gradually improving. He still has some right-sided numbness though his weakness has improved. He she was able to ambulate with physical therapy using his walker OBJECTIVE Most recent Vital Signs: Filed Vitals:   02/13/14 0200 02/13/14 0202 02/13/14 0543 02/13/14 1025  BP: 93/60 93/57 95/67  118/68  Pulse: 85  76 72  Temp: 97.7 F (36.5 C)  97.4 F (36.3 C) 97.4 F (36.3 C)  TempSrc: Oral  Oral Oral  Resp: 18  18 20   Height:      Weight:      SpO2: 98%  99% 97%   CBG (last 3)   Recent Labs  02/13/14 0405 02/13/14 0839 02/13/14 1128  GLUCAP 96 224* 189*    IV Fluid Intake:     MEDICATIONS  . atorvastatin  40 mg Oral QHS  . FLUoxetine  20 mg Oral Daily  . insulin aspart  0-9 Units Subcutaneous 6 times per day  . insulin detemir  15 Units Subcutaneous BID  . lisinopril  2.5 mg Oral Daily  . small volume/piggyback builder   Intravenous Once  . research study medication  300 mg Oral Once  . research study medication  100 mg Oral Daily  . research study medication  180 mg Oral Once  . research study medication  90 mg Oral BID  . pregabalin  150 mg Oral TID  . sodium chloride  250  mL Intravenous Once   PRN:  senna-docusate  Diet:  Carb Control   Activity:  Bedrest  DVT Prophylaxis:  SCDs  CLINICALLY SIGNIFICANT STUDIES Basic Metabolic Panel:   Recent Labs Lab 02/11/14 1648 02/11/14 1653 02/11/14 2034  NA 139 139  --   K 3.9 3.7  --   CL 102 103  --   CO2 22  --   --   GLUCOSE 215* 221*  --   BUN 6 4*  --   CREATININE 0.63 0.70 0.62  CALCIUM 8.9  --   --    Liver Function Tests:   Recent Labs Lab 02/11/14 1648  AST 23  ALT 34  ALKPHOS 108  BILITOT 0.2*  PROT 6.5  ALBUMIN 3.7   CBC:   Recent Labs Lab 02/11/14 1648 02/11/14 1653 02/11/14 2034  WBC 6.0  --  6.7  NEUTROABS 3.3  --   --   HGB 13.7 13.6 14.0  HCT 38.6* 40.0 39.8  MCV 88.3  --  88.8  PLT 125*  --  138*   Coagulation:   Recent Labs Lab 02/11/14 1648  LABPROT 12.3  INR 0.91   Cardiac Enzymes:   Recent Labs Lab 02/11/14 2034 02/12/14 0117 02/12/14 0635  TROPONINI <0.30 <  0.30 <0.30   Urinalysis:   Recent Labs Lab 02/11/14 1820  COLORURINE YELLOW  LABSPEC 1.026  PHURINE 7.0  GLUCOSEU 500*  HGBUR NEGATIVE  BILIRUBINUR NEGATIVE  KETONESUR NEGATIVE  PROTEINUR NEGATIVE  UROBILINOGEN 0.2  NITRITE NEGATIVE  LEUKOCYTESUR NEGATIVE   Lipid Panel    Component Value Date/Time   CHOL 147 02/12/2014 0635   TRIG 179* 02/12/2014 0635   HDL 33* 02/12/2014 0635   CHOLHDL 4.5 02/12/2014 0635   VLDL 36 02/12/2014 0635   LDLCALC 78 02/12/2014 0635   HgbA1C  Lab Results  Component Value Date   HGBA1C 8.6* 02/12/2014    Urine Drug Screen:     Component Value Date/Time   LABOPIA NONE DETECTED 02/11/2014 1820   COCAINSCRNUR NONE DETECTED 02/11/2014 1820   LABBENZ NONE DETECTED 02/11/2014 1820   AMPHETMU NONE DETECTED 02/11/2014 1820   THCU NONE DETECTED 02/11/2014 1820   LABBARB NONE DETECTED 02/11/2014 1820    Alcohol Level:   Recent Labs Lab 02/11/14 1648  ETH <11    Ct Angio Head W/cm &/or Wo Cm  02/11/2014   CLINICAL DATA:  Right-sided weakness.   Diabetic.  EXAM: CT ANGIOGRAPHY HEAD AND NECK  TECHNIQUE: Multidetector CT imaging of the head and neck was performed using the standard protocol during bolus administration of intravenous contrast. Multiplanar CT image reconstructions and MIPs were obtained to evaluate the vascular anatomy. Carotid stenosis measurements (when applicable) are obtained utilizing NASCET criteria, using the distal internal carotid diameter as the denominator.  CONTRAST:  50mL OMNIPAQUE IOHEXOL 350 MG/ML SOLN  COMPARISON:  [name] ear earlier same day  FINDINGS: CTA HEAD FINDINGS  Both internal carotid arteries show atherosclerotic disease in the siphon. On the right, there is 30% stenosis in on the left there is 50% stenosis supra clinoid internal carotid arteries are widely patent. Anterior and middle cerebral vessels are patent bilaterally without proximal stenosis or evidence of embolic occlusion.  Both vertebral arteries are patent, with the left terminating and PICA. The right vertebral artery supplies the basilar. There is 30% stenosis of the proximal basilar artery but no stenosis beyond that. Superior cerebellar and posterior cerebral arteries are patent.  No evidence of acute brain infarction. No evidence of venous occlusion.  Review of the MIP images confirms the above findings.  CTA NECK FINDINGS  Lung apices are clear. There is atherosclerosis of the aorta with unfolding. Branching pattern of the brachiocephalic vessels from the arch is normal without origin stenosis.  Right common carotid artery shows atherosclerosis with wall thickening but no stenosis. There is some atherosclerosis of the carotid bifurcation but no stenosis. The right ICA is tortuous but widely patent to the skullbase.  Left common carotid artery shows atherosclerosis with wall thickening but no stenosis. There is atherosclerosis of the carotid bifurcation but no stenosis. The left cervical ICA shows wall thickening but no stenosis, being patent to the  skullbase.  Both vertebral artery origins are widely patent with the right being dominant. Both vertebral arteries are widely patent through the cervical region.  Review of the MIP images confirms the above findings.  IMPRESSION: No carotid bifurcation stenosis. Smooth atherosclerotic thickening of the vascular walls.  Carotid siphon atherosclerosis bilaterally, 30% stenosis on the right and 50% stenosis on the left.  No evidence of large or medium vessel embolic occlusion.   Electronically Signed   By: Paulina FusiMark  Shogry M.D.   On: 02/11/2014 17:35   Dg Chest 2 View  02/11/2014   CLINICAL DATA:  Stroke.  Smoker.  EXAM: CHEST  2 VIEW  COMPARISON:  None.  FINDINGS: Lungs are adequately inflated without consolidation or effusion. Cardiomediastinal silhouette is within normal. There is minimal spondylosis of the spine.  IMPRESSION: No active cardiopulmonary disease.   Electronically Signed   By: Elberta Fortis M.D.   On: 02/11/2014 20:30   Ct Head Wo Contrast  02/11/2014   CLINICAL DATA:  Right-sided weakness  EXAM: CT HEAD WITHOUT CONTRAST  TECHNIQUE: Contiguous axial images were obtained from the base of the skull through the vertex without intravenous contrast.  COMPARISON:  Multiple previous CT and MR exams. Most recent study 01/25/2014.  FINDINGS: The brain does not show any evidence of acute infarction, mass lesion, hemorrhage, hydrocephalus or extra-axial collection. There are chronic small-vessel ischemic changes affecting the cerebral hemispheric deep white matter. On the previous CT, there was a punctate density in the right cerebellum that was not present on the older films or visible today. Presumably this represented a small hemorrhage.  IMPRESSION: No acute finding on today's study.  Chronic small vessel ischemic changes of the cerebral hemispheric white matter.  Punctate density in the right cerebellum seen on 07/24 is no longer visible and presumably represented a microhemorrhage which was acute at that  time.  This case was discussed with Dr. Amada Jupiter at 1709 hr.   Electronically Signed   By: Paulina Fusi M.D.   On: 02/11/2014 19:31   Ct Angio Neck W/cm &/or Wo/cm  02/11/2014   CLINICAL DATA:  Right-sided weakness.  Diabetic.  EXAM: CT ANGIOGRAPHY HEAD AND NECK  TECHNIQUE: Multidetector CT imaging of the head and neck was performed using the standard protocol during bolus administration of intravenous contrast. Multiplanar CT image reconstructions and MIPs were obtained to evaluate the vascular anatomy. Carotid stenosis measurements (when applicable) are obtained utilizing NASCET criteria, using the distal internal carotid diameter as the denominator.  CONTRAST:  50mL OMNIPAQUE IOHEXOL 350 MG/ML SOLN  COMPARISON:  [name] ear earlier same day  FINDINGS: CTA HEAD FINDINGS  Both internal carotid arteries show atherosclerotic disease in the siphon. On the right, there is 30% stenosis in on the left there is 50% stenosis supra clinoid internal carotid arteries are widely patent. Anterior and middle cerebral vessels are patent bilaterally without proximal stenosis or evidence of embolic occlusion.  Both vertebral arteries are patent, with the left terminating and PICA. The right vertebral artery supplies the basilar. There is 30% stenosis of the proximal basilar artery but no stenosis beyond that. Superior cerebellar and posterior cerebral arteries are patent.  No evidence of acute brain infarction. No evidence of venous occlusion.  Review of the MIP images confirms the above findings.  CTA NECK FINDINGS  Lung apices are clear. There is atherosclerosis of the aorta with unfolding. Branching pattern of the brachiocephalic vessels from the arch is normal without origin stenosis.  Right common carotid artery shows atherosclerosis with wall thickening but no stenosis. There is some atherosclerosis of the carotid bifurcation but no stenosis. The right ICA is tortuous but widely patent to the skullbase.  Left common  carotid artery shows atherosclerosis with wall thickening but no stenosis. There is atherosclerosis of the carotid bifurcation but no stenosis. The left cervical ICA shows wall thickening but no stenosis, being patent to the skullbase.  Both vertebral artery origins are widely patent with the right being dominant. Both vertebral arteries are widely patent through the cervical region.  Review of the MIP images confirms the above findings.  IMPRESSION: No carotid bifurcation stenosis. Smooth atherosclerotic thickening of the vascular walls.  Carotid siphon atherosclerosis bilaterally, 30% stenosis on the right and 50% stenosis on the left.  No evidence of large or medium vessel embolic occlusion.   Electronically Signed   By: Paulina Fusi M.D.   On: 02/11/2014 17:35   Mr Brain Wo Contrast  02/11/2014   CLINICAL DATA:  Right-sided weakness.  Diabetes.  EXAM: [name] HEAD WITHOUT CONTRAST  TECHNIQUE: Multiplanar, multiecho pulse sequences of the brain and surrounding structures were obtained without intravenous contrast.  COMPARISON:  [name] 01/25/2014  FINDINGS: Diffusion imaging suggests a 2 mm acute infarction in the left dorsal brainstem at the junction of the pons and midbrain. This is suggestive but not definite. No FLAIR or T2 signal abnormality in this location. No other sign of acute infarction. FLAIR and gradient imaging show moderate chronic small vessel ischemic changes affecting the deep and subcortical hemispheric white matter. There is no evidence of intracranial hemorrhage. No mass lesion, hydrocephalus or extra-axial collection. Major vessels at the base of the brain show flow.  IMPRESSION: Suggestion of a 2 mm acute infarction at the junction of the pons and midbrain dorsally on the left. This could be in the region of the trochlear nucleus, MLF or superior cerebellar peduncle.   Electronically Signed   By: Paulina Fusi M.D.   On: 02/11/2014 17:47   Mr Maxine Glenn Head/brain Wo Cm  02/11/2014   CLINICAL DATA:   Right-sided weakness  EXAM: MRA HEAD WITHOUT CONTRAST  TECHNIQUE: Angiographic images of the Circle of Willis were obtained using MRA technique without intravenous contrast.  COMPARISON:  Earlier same day  FINDINGS: Both internal carotid arteries are widely patent into the brain. The anterior and middle cerebral vessels are patent without proximal stenosis, aneurysm or vascular malformation.  Both vertebral arteries are patent. Left vertebral artery terminates in PICA. The right vertebral artery supplies plaque at the and continues on as the basilar. No basilar stenosis. Superior cerebellar and posterior cerebral arteries are patent and normal.  IMPRESSION: Normal intracranial MR angiography of the large and medium size vessels.   Electronically Signed   By: Paulina Fusi M.D.   On: 02/11/2014 20:45           Carotid Doppler  See CTA  2D Echocardiogram   Done last month  CXR  no active cardiopulmonary disease  EKG  sinus rhythm.  Therapy Recommendations  pending  Physical Exam  pleasant young Caucasian male currently not in distress.Awake alert. Afebrile. Head is nontraumatic. Neck is supple without bruit. Hearing is normal. Cardiac exam no murmur or gallop. Lungs are clear to auscultation. Distal pulses are well felt. Neurological Exam : Patient is awake but is sitting with eyes closed. He has some difficulty opening his eyes. He is legally blind in both eyes and denies light perception. Extraocular movements are full range without nystagmus. Fundi were not visualized. Face is symmetric without weakness. There is subjective decreased sensation on the right lower half of the face. Tongue is midline. Cough and gag intact. Motor system exam revealed no upper or lower extremity drift. Mild weakness of the right grip and intrinsic hand muscles. Orbits lefty over right upper extremity. Mild weakness of right grip. Sensation is diminished throughout on the right side. Coordination is slow but accurate.  Gait was not tested. Deep tendon pulses are 2+ symmetric ASSESSMENT Mr. Miguel Diaz is a 42 y.o. male presenting with right-sided weakness and numbness likely from a small  left brain subcortical infarct. Etiology likely small vessel disease. [name] scan of the brain shows a very questionable diffusion abnormality at the left Ponto medullary junction which I am not sure  is a definite infarct or can explain patient's symptoms On no antiplatelets prior to admission. Now on aspirin for secondary stroke prevention. Patient with resultant mild residual right sided numbness.   Bilateral blindness  LDL 78 mg% on Lipitor 40 mg PTA  HT on Prinivil PTA  Uncontrolled Diabetes on Metformin & Glipizide PTA- HbA1c 8.6   Hospital day # 2  TREATMENT/PLAN  Continue SOCRATES Trial medication for stroke prevention  Strict control of diabetes with hemoglobin A1c goal below   6.5%, lipids with LDL cholesterol goal below 70 mg percent and hypertension with blood pressure goal below 130/90.  Patient had recent stroke workup last month hence will not do further evaluation.  Discharge patient home today with home physical therapy.  Followup as an outpatient as per Socrates study protocol in 7 days  Delia Heady, MD       To contact Stroke Continuity provider, please refer to [email or web address]. After hours, contact General Neurology

## 2014-02-14 ENCOUNTER — Encounter (HOSPITAL_COMMUNITY): Payer: Self-pay | Admitting: Emergency Medicine

## 2014-02-15 ENCOUNTER — Telehealth: Payer: Self-pay | Admitting: Neurology

## 2014-02-15 NOTE — Telephone Encounter (Signed)
[facility]iguel [facility] with [facility] @ [phone], saw pt 1 x on 8/13 for PT admission, 2 x's a week for 3 weeks.  Requesting a verbal order for OT to evaluation and treat.  Please call and advise

## 2014-02-18 NOTE — Telephone Encounter (Signed)
Waiting on the ok from Dr. Roda ShuttersXu

## 2014-02-19 NOTE — ED Provider Notes (Signed)
I saw and evaluated the patient, reviewed the resident's note and I agree with the findings and plan.   EKG Interpretation   Date/Time:  Monday February 11 2014 17:45:22 EDT Ventricular Rate:  82 PR Interval:  120 QRS Duration: 81 QT Interval:  366 QTC Calculation: 427 R Axis:   77 Text Interpretation:  Sinus rhythm Borderline T abnormalities, inferior  leads ED PHYSICIAN INTERPRETATION AVAILABLE IN [facility] Confirmed  by TEST, Record ([id number]) on 02/13/2014 7:39:01 AM      Pt noted w acute onset right sided numbness/tingling, ?weakness. No headache. Labs. Imagine, neurology consulted.   Suzi RootsKevin E Aliani Caccavale, MD 02/19/14 [phone]

## 2014-02-19 NOTE — Telephone Encounter (Signed)
Called miss jones back  To give verbal orders for the patient to continue  Pt and OT. Left voicemail asking her to return my call.

## 2014-02-20 ENCOUNTER — Telehealth: Payer: Self-pay | Admitting: Neurology

## 2014-02-20 NOTE — Telephone Encounter (Signed)
I called and LMVM for [facility] re: verbal orders that ok to proceed with below.  Noted that 02-19-14 note from RR.   Hailey is to call back if needed.

## 2014-02-20 NOTE — Telephone Encounter (Signed)
Dorice LamasHailey Jones, Physical Therapist with [facility] @ [phone], requesting verbal orders for Physical Therapy 1 x on 8/13 for PT admission, 2 x's a week for 3 weeks and Occupational Therapy to evaluate and treat.  Please return call.

## 2014-02-23 ENCOUNTER — Emergency Department (HOSPITAL_COMMUNITY)
Admission: EM | Admit: 2014-02-23 | Discharge: 2014-02-23 | Disposition: A | Discharge status: Home / Self Care | Payer: Medicaid Other | Attending: Emergency Medicine | Admitting: Emergency Medicine

## 2014-02-23 ENCOUNTER — Emergency Department (HOSPITAL_COMMUNITY): Payer: Medicaid Other

## 2014-02-23 ENCOUNTER — Encounter (HOSPITAL_COMMUNITY): Payer: Self-pay | Admitting: Emergency Medicine

## 2014-02-23 DIAGNOSIS — Z8679 Personal history of other diseases of the circulatory system: Secondary | ICD-10-CM | POA: Diagnosis not present

## 2014-02-23 DIAGNOSIS — S99919A Unspecified injury of unspecified ankle, initial encounter: Secondary | ICD-10-CM

## 2014-02-23 DIAGNOSIS — Z880 Allergy status to penicillin: Secondary | ICD-10-CM | POA: Insufficient documentation

## 2014-02-23 DIAGNOSIS — Z8669 Personal history of other diseases of the nervous system and sense organs: Secondary | ICD-10-CM | POA: Insufficient documentation

## 2014-02-23 DIAGNOSIS — F329 Major depressive disorder, single episode, unspecified: Secondary | ICD-10-CM | POA: Diagnosis not present

## 2014-02-23 DIAGNOSIS — F411 Generalized anxiety disorder: Secondary | ICD-10-CM | POA: Diagnosis not present

## 2014-02-23 DIAGNOSIS — J45909 Unspecified asthma, uncomplicated: Secondary | ICD-10-CM | POA: Diagnosis not present

## 2014-02-23 DIAGNOSIS — Z8673 Personal history of transient ischemic attack (TIA), and cerebral infarction without residual deficits: Secondary | ICD-10-CM | POA: Insufficient documentation

## 2014-02-23 DIAGNOSIS — E785 Hyperlipidemia, unspecified: Secondary | ICD-10-CM | POA: Insufficient documentation

## 2014-02-23 DIAGNOSIS — F17200 Nicotine dependence, unspecified, uncomplicated: Secondary | ICD-10-CM | POA: Diagnosis not present

## 2014-02-23 DIAGNOSIS — S8010XA Contusion of unspecified lower leg, initial encounter: Secondary | ICD-10-CM | POA: Diagnosis not present

## 2014-02-23 DIAGNOSIS — F3289 Other specified depressive episodes: Secondary | ICD-10-CM | POA: Insufficient documentation

## 2014-02-23 DIAGNOSIS — S99929A Unspecified injury of unspecified foot, initial encounter: Secondary | ICD-10-CM

## 2014-02-23 DIAGNOSIS — Y9389 Activity, other specified: Secondary | ICD-10-CM | POA: Diagnosis not present

## 2014-02-23 DIAGNOSIS — Z79899 Other long term (current) drug therapy: Secondary | ICD-10-CM | POA: Diagnosis not present

## 2014-02-23 DIAGNOSIS — W010XXA Fall on same level from slipping, tripping and stumbling without subsequent striking against object, initial encounter: Secondary | ICD-10-CM | POA: Diagnosis not present

## 2014-02-23 DIAGNOSIS — E119 Type 2 diabetes mellitus without complications: Secondary | ICD-10-CM | POA: Diagnosis not present

## 2014-02-23 DIAGNOSIS — S8012XA Contusion of left lower leg, initial encounter: Secondary | ICD-10-CM

## 2014-02-23 DIAGNOSIS — S8990XA Unspecified injury of unspecified lower leg, initial encounter: Secondary | ICD-10-CM | POA: Diagnosis present

## 2014-02-23 DIAGNOSIS — Y9289 Other specified places as the place of occurrence of the external cause: Secondary | ICD-10-CM | POA: Insufficient documentation

## 2014-02-23 MED ORDER — TRAMADOL HCL 50 MG PO TABS
50.0000 mg | ORAL_TABLET | Freq: Four times a day (QID) | ORAL | Status: DC | PRN
Start: 2014-02-23 — End: 2014-03-27

## 2014-02-23 NOTE — Discharge Instructions (Signed)
Blunt Trauma °You have been evaluated for injuries. You have been examined and your caregiver has not found injuries serious enough to require hospitalization. °It is common to have multiple bruises and sore muscles following an accident. These tend to feel worse for the first 24 hours. You will feel more stiffness and soreness over the next several hours and worse when you wake up the first morning after your accident. After this point, you should begin to improve with each passing day. The amount of improvement depends on the amount of damage done in the accident. °Following your accident, if some part of your body does not work as it should, or if the pain in any area continues to increase, you should return to the Emergency Department for re-evaluation.  °HOME CARE INSTRUCTIONS  °Routine care for sore areas should include: °· Ice to sore areas every 2 hours for 20 minutes while awake for the next 2 days. °· Drink extra fluids (not alcohol). °· Take a hot or warm shower or bath once or twice a day to increase blood flow to sore muscles. This will help you "limber up". °· Activity as tolerated. Lifting may aggravate neck or back pain. °· Only take over-the-counter or prescription medicines for pain, discomfort, or fever as directed by your caregiver. Do not use aspirin. This may increase bruising or increase bleeding if there are small areas where this is happening. °SEEK IMMEDIATE MEDICAL CARE IF: °· Numbness, tingling, weakness, or problem with the use of your arms or legs. °· A severe headache is not relieved with medications. °· There is a change in bowel or bladder control. °· Increasing pain in any areas of the body. °· Short of breath or dizzy. °· Nauseated, vomiting, or sweating. °· Increasing belly (abdominal) discomfort. °· Blood in urine, stool, or vomiting blood. °· Pain in either shoulder in an area where a shoulder strap would be. °· Feelings of lightheadedness or if you have a fainting  episode. °Sometimes it is not possible to identify all injuries immediately after the trauma. It is important that you continue to monitor your condition after the emergency department visit. If you feel you are not improving, or improving more slowly than should be expected, call your physician. If you feel your symptoms (problems) are worsening, return to the Emergency Department immediately. °Document Released: 03/17/2001 Document Revised: 09/13/2011 Document Reviewed: 02/07/2008 °[employer]® Patient Information ©2015 [employer]. This information is not intended to replace advice given to you by your health care provider. Make sure you discuss any questions you have with your health care provider. ° °Contusion °A contusion is a deep bruise. Contusions happen when an injury causes bleeding under the skin. Signs of bruising include pain, puffiness (swelling), and discolored skin. The contusion may turn blue, purple, or yellow. °HOME CARE  °· Put ice on the injured area. °¨ Put ice in a plastic bag. °¨ Place a towel between your skin and the bag. °¨ Leave the ice on for 15-20 minutes, 03-04 times a day. °· Only take medicine as told by your doctor. °· Rest the injured area. °· If possible, raise (elevate) the injured area to lessen puffiness. °GET HELP RIGHT AWAY IF:  °· You have more bruising or puffiness. °· You have pain that is getting worse. °· Your puffiness or pain is not helped by medicine. °MAKE SURE YOU:  °· Understand these instructions. °· Will watch your condition. °· Will get help right away if you are not doing well or   get worse. °Document Released: 12/08/2007 Document Revised: 09/13/2011 Document Reviewed: 04/26/2011 °ExitCare® Patient Information ©2015 [employer]. This information is not intended to replace advice given to you by your health care provider. Make sure you discuss any questions you have with your health care provider. ° °

## 2014-02-23 NOTE — ED Notes (Signed)
Presents with right knee pain that goes into right lower leg. Pain is worse with ambulation. Pain begna fter fall this evening when right knee gave out and landed on right knee and leg.

## 2014-02-23 NOTE — ED Provider Notes (Signed)
CSN: [id number]     Arrival date & time 02/23/14  0112 History   First MD Initiated Contact with Patient 02/23/14 [phone]     Chief Complaint  Patient presents with  . Leg Pain     (Consider location/radiation/quality/duration/timing/severity/associated sxs/prior Treatment) HPI  Patient is a 43 yo man with multiple chronic medical medical problems who comes in after tripping, fall and landing against his left lower leg pain. Patient says he sustained injury to his leg and has aching pain over the lateral aspect of the lower leg. No paresthesias or motor weakness. Worse with weight bearing but, able to bear weight.   Past Medical History  Diagnosis Date  . Diabetes mellitus without complication   . Neuropathy   . Asthma   . Vision loss of left eye   . CVA (cerebral infarction)   . Blindness   . Hypercholesteremia   . Depression   . Anxiety   . Stroke    Past Surgical History  Procedure Laterality Date  . Hernia repair    . Eye surgery Left    History reviewed. No pertinent family history. History  Substance Use Topics  . Smoking status: Current Every Day Smoker -- 1.00 packs/day    Types: Cigarettes  . Smokeless tobacco: Never Used  . Alcohol Use: 1.2 oz/week    2 Cans of beer per week     Comment: Ocassional drinker    Review of Systems      10 point review of symptoms obtained and is negative with the exceptions of symptoms noted abov.e   Allergies  Penicillins; Penicillins; Toradol; and Toradol  Home Medications   Prior to Admission medications   Medication Sig Start Date End Date Taking? Authorizing Provider  atorvastatin (LIPITOR) 40 MG tablet Take 40 mg by mouth daily.   Yes Historical Provider, MD  FLUoxetine (PROZAC) 20 MG capsule Take 20 mg by mouth daily.   Yes Historical Provider, MD  glipiZIDE (GLUCOTROL XL) 10 MG 24 hr tablet Take 10 mg by mouth daily with breakfast.   Yes Historical Provider, MD  lisinopril (PRINIVIL,ZESTRIL) 2.5 MG tablet Take 2.5  mg by mouth daily.   Yes Historical Provider, MD  metFORMIN (GLUMETZA) 500 MG (MOD) 24 hr tablet Take 1,000 mg by mouth 2 (two) times daily.   Yes Historical Provider, MD  Misc. Devices (HEART RATE MONITOR) MISC 1 Units by Does not apply route as directed. 01/27/14  Yes Brittainy Simmons, PA-C  pregabalin (LYRICA) 150 MG capsule Take 150 mg by mouth 2 (two) times daily.   Yes Historical Provider, MD  research study medication Take 90 mg by mouth 2 (two) times daily. 02/13/14  Yes Maryann Mikhail, DO  research study medication Take 180 mg by mouth once. 02/13/14  Yes Maryann Mikhail, DO   BP 117/79  Pulse 84  Temp(Src) 98.3 F (36.8 C) (Oral)  Resp 20  Wt 188 lb 4 oz (85.39 kg)  SpO2 97% Physical Exam  Gen: well nourished and well developed appearing Head: NCAT Ears: normal to inspection Nose: normal to inspection, no epistaxis or drainage Mouth: oral mucsoa is well hydrated appearing, normal posterior oropharynx Neck: supple, no stridor CV: RRR, no murmur, palpable peripheral pulses Resp: lung sounds are clear to auscultation bilaterally, no wheeing or rhonchi or rales, normal respiratory effort.  Abd: soft, nontender, nondistended Extremities: normal to inspection, mild ttp over the lateral knee, FROM, lower leg wnl. Ankle and foot wnl. NVI.  Skin: warm and dry Neuro: CN  ii - XII, no focal deficitis Psyche; normal affect, cooperative.   ED Course  Procedures (including critical care time) Labs Review Labs Reviewed - No data to display  Imaging Review Dg Tibia/fibula Right  02/23/2014   CLINICAL DATA:  43 year old male with lower extremity pain after fall. Initial encounter.  EXAM: RIGHT TIBIA AND FIBULA - 2 VIEW  COMPARISON:  Right knee series from the same day reported separately.  FINDINGS: Bone mineralization is within normal limits. Alignment at the right knee and ankle appears preserved. Calcaneus appears intact. Right tibia and fibula appear intact.  IMPRESSION: No acute  fracture or dislocation identified about the right tib-fib.   Electronically Signed   By: Augusto Gamble M.D.   On: 02/23/2014 02:29   Dg Knee Complete 4 Views Right  02/23/2014   CLINICAL DATA:  43 year old male status post fall with pain. Initial encounter.  EXAM: RIGHT KNEE - COMPLETE 4+ VIEW  COMPARISON:  [facility] right knee series 03/31/2005  FINDINGS: No joint effusion identified. Patella intact. Stable joint spaces. No acute fracture or dislocation.  IMPRESSION: No acute fracture or dislocation identified about the right knee.   Electronically Signed   By: Augusto Gamble M.D.   On: 02/23/2014 02:28      MDM   Contusion of the left lower leg. Stable for discharge with symptomatic management.    Brandt Loosen, MD 02/23/14 [phone]

## 2014-02-25 ENCOUNTER — Telehealth: Payer: Self-pay | Admitting: Neurology

## 2014-02-25 NOTE — Telephone Encounter (Signed)
Dr. Roda Shutters please note Miguel Diaz has had a fall and but it was not serious or life threaten his leg was burised.

## 2014-02-25 NOTE — Telephone Encounter (Signed)
Miguel Diaz with [facility] is calling regarding patient--needs to notify Dr. Roda Shutters that patient's girlfriend advised that patient fell on 8-22 and injured his right leg--patient went to the ER but they said it was only bruised with no fractures.

## 2014-02-25 NOTE — Telephone Encounter (Signed)
Thank you for letting me know.  Marvel Plan, MD PhD Stroke Neurology 02/25/2014 10:47 PM

## 2014-03-05 ENCOUNTER — Encounter: Payer: Self-pay | Admitting: Cardiology

## 2014-03-06 ENCOUNTER — Encounter (INDEPENDENT_AMBULATORY_CARE_PROVIDER_SITE_OTHER): Payer: Medicaid Other

## 2014-03-06 ENCOUNTER — Telehealth: Payer: Self-pay | Admitting: Cardiology

## 2014-03-06 DIAGNOSIS — R55 Syncope and collapse: Secondary | ICD-10-CM

## 2014-03-06 NOTE — Telephone Encounter (Signed)
Received [employer] application.  Once [place] enrolled Miguel Diaz in the [employer] program, I will fax the application to [facility].  They will review the application and let the patient know if he is eligable.

## 2014-03-07 ENCOUNTER — Encounter: Payer: Self-pay | Admitting: *Deleted

## 2014-03-07 NOTE — Telephone Encounter (Signed)
New message     Pt has been approved for hardship.  The original order was for an ACT monitor---do you want to change it to an event monitor or keep the original order for an ACT monitor?

## 2014-03-07 NOTE — Progress Notes (Unsigned)
Patient ID: Miguel Diaz., male   DOB: October 21, 1970, 43 y.o.   MRN: [id number] Patient approved for hardship from [employer].  They will mail the patient an ACT monitor which can be used without a landline.

## 2014-03-07 NOTE — Progress Notes (Signed)
Patient ID: Miguel Diaz., male   DOB: 08-Oct-1970, 43 y.o.   MRN: [id number] Patient enrolled with [employer] for a 30 day cardiac event monitor pending approval of hardship.  Message sent to [employer], patient does not have a land line so and ACT monitor must be used. We will apply monitor in office.  Patient will be scheduled once hardship is approved.

## 2014-03-07 NOTE — Telephone Encounter (Signed)
Spoke with Al Pimple and monitor to be sent to patient

## 2014-03-26 ENCOUNTER — Ambulatory Visit: Payer: Self-pay | Admitting: Neurology

## 2014-03-27 ENCOUNTER — Encounter (INDEPENDENT_AMBULATORY_CARE_PROVIDER_SITE_OTHER): Payer: Self-pay

## 2014-03-27 ENCOUNTER — Ambulatory Visit (INDEPENDENT_AMBULATORY_CARE_PROVIDER_SITE_OTHER): Payer: Medicaid Other | Admitting: Neurology

## 2014-03-27 ENCOUNTER — Encounter: Payer: Self-pay | Admitting: Neurology

## 2014-03-27 VITALS — BP 112/73 | HR 80 | Ht 68.0 in | Wt 196.4 lb

## 2014-03-27 DIAGNOSIS — I6330 Cerebral infarction due to thrombosis of unspecified cerebral artery: Secondary | ICD-10-CM

## 2014-03-27 DIAGNOSIS — E1159 Type 2 diabetes mellitus with other circulatory complications: Secondary | ICD-10-CM

## 2014-03-27 DIAGNOSIS — E785 Hyperlipidemia, unspecified: Secondary | ICD-10-CM | POA: Insufficient documentation

## 2014-03-27 DIAGNOSIS — I10 Essential (primary) hypertension: Secondary | ICD-10-CM

## 2014-03-27 NOTE — Progress Notes (Signed)
STROKE NEUROLOGY FOLLOW UP NOTE  NAME: Miguel Diaz. DOB: Sep 07, 1970  REASON FOR VISIT: stroke follow up HISTORY FROM: wife and chart   Today we had the pleasure of seeing Angelos Wasco. in follow-up at our [facility]. Pt was accompanied by wife.   History Summary Wasyl Dornfeld is a 43 y.o. male with a history of DM, DM neuropathy, HLD, blindness was admitted on 02/11/14 for acute onset right-sided numbness and weakness. Wife states that he then became unresponsive for a brief period of time, but began improving in transit with EMS. On arrival to ER, he still had some right-sided weakness, but this seemed to improve but he continued to have right-sided numbness. [name] showed questionable punctate acute infarct in the left dorsal brainstem at the junction of pons and midbrain. However, this did not correspond to pt symptoms. LDL 78 and A1C 8.6. He was enrolled in SOCRATES trial and on either ASA or brilinta for stroke prevention.   Interval History During the interval time, the patient has been doing stable. He stated that his symptoms of right side weakness and numbness are gone, only has some bilateral toe numbness which he has before due to DM neuropathy. He stated that his sugar is better, this am 127, more strict diet at home.   He continued to have vision difficulties. He stated that her in 2013, he had left eye vision loss, sudden onset, was told to be due to diabetes or maybe congenital causes. In 01/2014, he passed out and admitted to [facility] for evaluation. He was told to have post-micturition syncope and was discharged after improvement. But wife said since then he had right eye tunnel vision. Has been followed up with eye doctor and etiology not clear.    REVIEW OF SYSTEMS: Full 14 system review of systems performed and notable only for those listed below and in HPI above, all others are negative:  Constitutional: N/A  Cardiovascular: N/A  Ear/Nose/Throat: N/A  Skin:  N/A  Eyes: Loss of vision  Respiratory: Cough, wheezing, snoring  Gastroitestinal: N/A  Genitourinary: N/A Hematology/Lymphatic: N/A  Endocrine: N/A  Musculoskeletal: N/A  Allergy/Immunology: Allergies, runny nose Neurological: N/A  Psychiatric: Depression, anxiety, not enough sleep, insomnia  The following represents the patient's updated allergies and side effects list: Allergies  Allergen Reactions  . Penicillins Other (See Comments)    Childhood allergy  . Penicillins     unknown  . Toradol [Ketorolac Tromethamine] Hives  . Toradol [Ketorolac Tromethamine]     Hives     Labs since last visit of relevance include the following: Results for orders placed during the hospital encounter of 02/11/14  ETHANOL      Result Value Ref Range   Alcohol, Ethyl (B) <11  0 - 11 mg/dL  PROTIME-INR      Result Value Ref Range   Prothrombin Time 12.3  11.6 - 15.2 seconds   INR 0.91  0.00 - 1.49  APTT      Result Value Ref Range   aPTT 28  24 - 37 seconds  CBC      Result Value Ref Range   WBC 6.0  4.0 - 10.5 K/uL   RBC 4.37  4.22 - 5.81 MIL/uL   Hemoglobin 13.7  13.0 - 17.0 g/dL   HCT 16.1 (*) 09.6 - 04.5 %   MCV 88.3  78.0 - 100.0 fL   MCH 31.4  26.0 - 34.0 pg   MCHC 35.5  30.0 -  36.0 g/dL   RDW 16.1  09.6 - 04.5 %   Platelets 125 (*) 150 - 400 K/uL  DIFFERENTIAL      Result Value Ref Range   Neutrophils Relative % 56  43 - 77 %   Neutro Abs 3.3  1.7 - 7.7 K/uL   Lymphocytes Relative 32  12 - 46 %   Lymphs Abs 1.9  0.7 - 4.0 K/uL   Monocytes Relative 8  3 - 12 %   Monocytes Absolute 0.5  0.1 - 1.0 K/uL   Eosinophils Relative 3  0 - 5 %   Eosinophils Absolute 0.2  0.0 - 0.7 K/uL   Basophils Relative 1  0 - 1 %   Basophils Absolute 0.0  0.0 - 0.1 K/uL  COMPREHENSIVE METABOLIC PANEL      Result Value Ref Range   Sodium 139  137 - 147 mEq/L   Potassium 3.9  3.7 - 5.3 mEq/L   Chloride 102  96 - 112 mEq/L   CO2 22  19 - 32 mEq/L   Glucose, Bld 215 (*) 70 - 99 mg/dL    BUN 6  6 - 23 mg/dL   Creatinine, Ser 4.09  0.50 - 1.35 mg/dL   Calcium 8.9  8.4 - 81.1 mg/dL   Total Protein 6.5  6.0 - 8.3 g/dL   Albumin 3.7  3.5 - 5.2 g/dL   AST 23  0 - 37 U/L   ALT 34  0 - 53 U/L   Alkaline Phosphatase 108  39 - 117 U/L   Total Bilirubin 0.2 (*) 0.3 - 1.2 mg/dL   GFR calc non Af Amer >90  >90 mL/min   GFR calc Af Amer >90  >90 mL/min   Anion gap 15  5 - 15  URINE RAPID DRUG SCREEN (HOSP PERFORMED)      Result Value Ref Range   Opiates NONE DETECTED  NONE DETECTED   Cocaine NONE DETECTED  NONE DETECTED   Benzodiazepines NONE DETECTED  NONE DETECTED   Amphetamines NONE DETECTED  NONE DETECTED   Tetrahydrocannabinol NONE DETECTED  NONE DETECTED   Barbiturates NONE DETECTED  NONE DETECTED  URINALYSIS, ROUTINE W REFLEX MICROSCOPIC      Result Value Ref Range   Color, Urine YELLOW  YELLOW   APPearance CLEAR  CLEAR   Specific Gravity, Urine 1.026  1.005 - 1.030   pH 7.0  5.0 - 8.0   Glucose, UA 500 (*) NEGATIVE mg/dL   Hgb urine dipstick NEGATIVE  NEGATIVE   Bilirubin Urine NEGATIVE  NEGATIVE   Ketones, ur NEGATIVE  NEGATIVE mg/dL   Protein, ur NEGATIVE  NEGATIVE mg/dL   Urobilinogen, UA 0.2  0.0 - 1.0 mg/dL   Nitrite NEGATIVE  NEGATIVE   Leukocytes, UA NEGATIVE  NEGATIVE  HEMOGLOBIN A1C      Result Value Ref Range   Hemoglobin A1C 8.6 (*) <5.7 %   Mean Plasma Glucose 200 (*) <117 mg/dL  CBC      Result Value Ref Range   WBC 6.7  4.0 - 10.5 K/uL   RBC 4.48  4.22 - 5.81 MIL/uL   Hemoglobin 14.0  13.0 - 17.0 g/dL   HCT 91.4  78.2 - 95.6 %   MCV 88.8  78.0 - 100.0 fL   MCH 31.3  26.0 - 34.0 pg   MCHC 35.2  30.0 - 36.0 g/dL   RDW 21.3  08.6 - 57.8 %   Platelets 138 (*) 150 - 400 K/uL  CREATININE, SERUM      Result Value Ref Range   Creatinine, Ser 0.62  0.50 - 1.35 mg/dL   GFR calc non Af Amer >90  >90 mL/min   GFR calc Af Amer >90  >90 mL/min  TROPONIN I      Result Value Ref Range   Troponin I <0.30  <0.30 ng/mL  TROPONIN I      Result Value Ref  Range   Troponin I <0.30  <0.30 ng/mL  TROPONIN I      Result Value Ref Range   Troponin I <0.30  <0.30 ng/mL  AMMONIA      Result Value Ref Range   Ammonia 24  11 - 60 umol/L  LIPID PANEL      Result Value Ref Range   Cholesterol 147  0 - 200 mg/dL   Triglycerides 782 (*) <150 mg/dL   HDL 33 (*) >95 mg/dL   Total CHOL/HDL Ratio 4.5     VLDL 36  0 - 40 mg/dL   LDL Cholesterol 78  0 - 99 mg/dL  GLUCOSE, CAPILLARY      Result Value Ref Range   Glucose-Capillary 126 (*) 70 - 99 mg/dL   Comment 1 Documented in Chart     Comment 2 Notify RN    GLUCOSE, CAPILLARY      Result Value Ref Range   Glucose-Capillary 172 (*) 70 - 99 mg/dL   Comment 1 Documented in Chart     Comment 2 Notify RN    GLUCOSE, CAPILLARY      Result Value Ref Range   Glucose-Capillary 100 (*) 70 - 99 mg/dL   Comment 1 Notify RN     Comment 2 Documented in Chart    GLUCOSE, CAPILLARY      Result Value Ref Range   Glucose-Capillary 199 (*) 70 - 99 mg/dL   Comment 1 Notify RN     Comment 2 Documented in Chart    GLUCOSE, CAPILLARY      Result Value Ref Range   Glucose-Capillary 154 (*) 70 - 99 mg/dL   Comment 1 Documented in Chart     Comment 2 Notify RN    GLUCOSE, CAPILLARY      Result Value Ref Range   Glucose-Capillary 213 (*) 70 - 99 mg/dL   Comment 1 Documented in Chart     Comment 2 Notify RN    GLUCOSE, CAPILLARY      Result Value Ref Range   Glucose-Capillary 179 (*) 70 - 99 mg/dL   Comment 1 Documented in Chart     Comment 2 Notify RN    GLUCOSE, CAPILLARY      Result Value Ref Range   Glucose-Capillary 150 (*) 70 - 99 mg/dL   Comment 1 Documented in Chart     Comment 2 Notify RN    GLUCOSE, CAPILLARY      Result Value Ref Range   Glucose-Capillary 96  70 - 99 mg/dL  GLUCOSE, CAPILLARY      Result Value Ref Range   Glucose-Capillary 224 (*) 70 - 99 mg/dL   Comment 1 Documented in Chart     Comment 2 Notify RN    GLUCOSE, CAPILLARY      Result Value Ref Range   Glucose-Capillary  189 (*) 70 - 99 mg/dL   Comment 1 Notify RN     Comment 2 Documented in Chart    I-STAT CHEM 8, ED      Result Value Ref  Range   Sodium 139  137 - 147 mEq/L   Potassium 3.7  3.7 - 5.3 mEq/L   Chloride 103  96 - 112 mEq/L   BUN 4 (*) 6 - 23 mg/dL   Creatinine, Ser 1.61  0.50 - 1.35 mg/dL   Glucose, Bld 096 (*) 70 - 99 mg/dL   Calcium, Ion 0.45  4.09 - 1.23 mmol/L   TCO2 23  0 - 100 mmol/L   Hemoglobin 13.6  13.0 - 17.0 g/dL   HCT 81.1  91.4 - 78.2 %  I-STAT TROPOININ, ED      Result Value Ref Range   Troponin i, poc 0.00  0.00 - 0.08 ng/mL   Comment 3           I-STAT CG4 LACTIC ACID, ED      Result Value Ref Range   Lactic Acid, Venous 2.13  0.5 - 2.2 mmol/L  CBG MONITORING, ED      Result Value Ref Range   Glucose-Capillary 185 (*) 70 - 99 mg/dL    The neurologically relevant items on the patient's problem list and lesion he and currently he equal and were reviewed on today's visit.  Neurologic Examination  A problem focused neurological exam (12 or more points of the single system neurologic examination, vital signs counts as 1 point, cranial nerves count for 8 points) was performed.  Blood pressure 112/73, pulse 80, height 5\' 8"  (1.727 m), weight 196 lb 6.4 oz (89.086 kg).  General - Well nourished, well developed, in no apparent distress.  Ophthalmologic - not able to see through  Cardiovascular - Regular rate and rhythm with no murmur.  Mental Status -  Level of arousal and orientation to time, place, and person were intact. Language including expression, naming, repetition, comprehension was assessed and found intact.  Cranial Nerves II - XII - II - left eye vision loss, not able to see light or hand waving. Right eye central vision can see Finger counting. III, IV, VI - Extraocular movements intact, pupillary reflex bilateral intact. V - Facial sensation intact bilaterally. VII - Facial movement intact bilaterally. VIII - Hearing & vestibular intact  bilaterally. X - Palate elevates symmetrically. XI - Chin turning & shoulder shrug intact bilaterally. XII - Tongue protrusion intact.  Motor Strength - The patient's strength was normal in all extremities and pronator drift was absent.  Bulk was normal and fasciculations were absent.   Motor Tone - Muscle tone was assessed at the neck and appendages and was normal.  Reflexes - The patient's reflexes were normal in all extremities and he had no pathological reflexes.  Sensory - Light touch, temperature/pinprick were assessed and were normal.    Coordination - The patient had normal movements in the hands and feet with no ataxia or dysmetria.  Tremor was absent.  Gait and Station - slow due to vision deficit, but no hemiparetic gait.  Data reviewed: I personally reviewed the images and agree with the radiology interpretations.  Ct Head Wo Contrast: 01/25/2014 1. Punctate 3 mm focus of increased is seen in the right posterior fossa. This could be a tiny subarachnoid hemorrhage or right cerebellar hemorrhage. 2. Right frontal sinusitis. Multiple mucous retention cyst noted throughout the paranasal sinuses. 3. No evidence of cervical spine fracture or dislocation.   Ct Cervical Spine Wo Contrast 01/25/2014: 1. Punctate 3 mm focus of increased is seen in the right posterior fossa. This could be a tiny subarachnoid hemorrhage or right cerebellar hemorrhage. 2.  Right frontal sinusitis. Multiple mucous retention cyst noted throughout the paranasal sinuses. 3. No evidence of cervical spine fracture or dislocation.   [name] Brain Wo Contrast: 01/25/2014 1. No evidence of acute intracranial abnormality. The punctate hyperattenuating focus near the right foramen of Luschka on recent CT also appears to have been present on the 2013 [name], although less conspicuous on the older examination. Therefore, this most likely represents calcification, such as in choroid plexus, rather than acute hemorrhage. 2.  Mild-to-moderate chronic small vessel ischemic disease.   01/26/14 Echocardiogram: Upper normal wall thickness with LVEF 60-65%, grade 2 diastolic dysfunction. MAC with trivial mitral regurgitation. PASP 12 mmHg.   Carotid dopplers: BIlateral: mild soft plaque CCA and origin ICA. 1-39% ICA stenosis. Vertebral artery flow is antegrade.  MYOCARDIAL IMAGING WITH SPECT (REST AND PHARMACOLOGIC-STRESS): No definite inducible ischemia with pharmacologic stress. 67% ejection fraction  Ct Angio Head and neck W/cm &/or Wo Cm  02/11/2014  IMPRESSION: No carotid bifurcation stenosis. Smooth atherosclerotic thickening of the vascular walls. Carotid siphon atherosclerosis bilaterally, 30% stenosis on the right and 50% stenosis on the left. No evidence of large or medium vessel embolic occlusion.   Ct Head Wo Contrast  02/11/2014 IMPRESSION: No acute finding on today's study. Chronic small vessel ischemic changes of the cerebral hemispheric white matter. Punctate density in the right cerebellum seen on 07/24 is no longer visible and presumably represented a microhemorrhage which was acute at that time. ( I think it is not the same cut and the 8/10 one still has a little cut of the hyperdensity view.)   [name] Brain Wo Contrast  02/11/2014 IMPRESSION: Suggestion of a 2 mm acute infarction at the junction of the pons and midbrain dorsally on the left. This could be in the region of the trochlear nucleus, MLF or superior cerebellar peduncle.    Mra Head/brain Wo Cm  02/11/2014  IMPRESSION: Normal intracranial MR angiography of the large and medium size vessels.   Component     Latest Ref Rng 01/27/2014 02/12/2014  Cholesterol     0 - 200 mg/dL 811 914  Triglycerides     <150 mg/dL 782 (H) 956 (H)  HDL     >39 mg/dL 34 (L) 33 (L)  Total CHOL/HDL Ratio      4.5 4.5  VLDL     0 - 40 mg/dL 43 (H) 36  LDL (calc)     0 - 99 mg/dL 75 78  Hemoglobin O1H     <5.7 %  8.6 (H)  Mean Plasma Glucose     <117 mg/dL  086  (H)  TSH     5.784 - 4.500 uIU/mL 1.620     Assessment: As you may recall, he is a 43 y.o. Caucasian male with PMH of DM, DM neuropathy, HLD, blindness was admitted on 02/11/14 for acute onset right-sided numbness and weakness followed by unresponsiveness. He was admitted in 01/2014 for a syncope episode. Not sure if there is any association between this two admissions. [name] showed ambiguous punctate DWI changes at dorsal brainstem but not fit to the symptoms. But he does have risk factors for stroke. He is in SOCRATES trial, will continue.   His eye exam is of interest, vision loss on the left and tunnel vision on the right, with intact pupillary reflex. This picture did not support optic nerve or retinal pathology and also not supporting cortical blindness as the vision loss pattern not cortical pattern and [name] did not show occipital lesions.  He needs continue to follow up with ophthalmology to rule out local eye pathology.   Plan:  - Continue research trial medications  - Continue Lipitor for hyperlipidemia and stroke prevention  - Monitor blood pressure and glucose at home  - Followup with primary care physician for stroke risk factor modification  - Follow up closely with ophthalmologist for visual loss  - RTC in 3 months   No orders of the defined types were placed in this encounter.    Meds ordered this encounter  Medications  . ibuprofen (ADVIL,MOTRIN) 600 MG tablet    Sig: Take 600 mg by mouth every 6 (six) hours as needed.    Patient Instructions  - continue the research study meds - continue lipitor for HLD and stroke prevention - continue to monitor BP and glucose at home - follow up your PCP for stroke risk factor modification - follow up your eye doctor for vision next month. - follow up in 3 months.   Marvel Plan, MD PhD [facility] [address] [place], [place] [address] [phone]

## 2014-03-27 NOTE — Patient Instructions (Signed)
-   continue the research study meds - continue lipitor for HLD and stroke prevention - continue to monitor BP and glucose at home - follow up your PCP for stroke risk factor modification - follow up your eye doctor for vision next month. - follow up in 3 months.

## 2014-04-08 ENCOUNTER — Emergency Department (HOSPITAL_COMMUNITY): Payer: Medicaid Other

## 2014-04-08 ENCOUNTER — Encounter (HOSPITAL_COMMUNITY): Payer: Self-pay | Admitting: Emergency Medicine

## 2014-04-08 ENCOUNTER — Observation Stay (HOSPITAL_COMMUNITY)
Admission: EM | Admit: 2014-04-08 | Discharge: 2014-04-11 | Disposition: A | Discharge status: Home / Self Care | Payer: Medicaid Other | Attending: Internal Medicine | Admitting: Internal Medicine

## 2014-04-08 DIAGNOSIS — I6340 Cerebral infarction due to embolism of unspecified cerebral artery: Secondary | ICD-10-CM

## 2014-04-08 DIAGNOSIS — W1830XA Fall on same level, unspecified, initial encounter: Secondary | ICD-10-CM | POA: Insufficient documentation

## 2014-04-08 DIAGNOSIS — Y9289 Other specified places as the place of occurrence of the external cause: Secondary | ICD-10-CM | POA: Insufficient documentation

## 2014-04-08 DIAGNOSIS — S199XXA Unspecified injury of neck, initial encounter: Secondary | ICD-10-CM | POA: Diagnosis not present

## 2014-04-08 DIAGNOSIS — Z720 Tobacco use: Secondary | ICD-10-CM | POA: Diagnosis not present

## 2014-04-08 DIAGNOSIS — E785 Hyperlipidemia, unspecified: Secondary | ICD-10-CM

## 2014-04-08 DIAGNOSIS — Z86718 Personal history of other venous thrombosis and embolism: Secondary | ICD-10-CM | POA: Insufficient documentation

## 2014-04-08 DIAGNOSIS — S0990XA Unspecified injury of head, initial encounter: Secondary | ICD-10-CM | POA: Diagnosis present

## 2014-04-08 DIAGNOSIS — S3982XA Other specified injuries of lower back, initial encounter: Secondary | ICD-10-CM | POA: Diagnosis not present

## 2014-04-08 DIAGNOSIS — I10 Essential (primary) hypertension: Secondary | ICD-10-CM | POA: Diagnosis present

## 2014-04-08 DIAGNOSIS — F329 Major depressive disorder, single episode, unspecified: Secondary | ICD-10-CM | POA: Diagnosis not present

## 2014-04-08 DIAGNOSIS — Y9389 Activity, other specified: Secondary | ICD-10-CM | POA: Diagnosis not present

## 2014-04-08 DIAGNOSIS — F419 Anxiety disorder, unspecified: Secondary | ICD-10-CM | POA: Diagnosis not present

## 2014-04-08 DIAGNOSIS — Z23 Encounter for immunization: Secondary | ICD-10-CM | POA: Diagnosis not present

## 2014-04-08 DIAGNOSIS — S79919A Unspecified injury of unspecified hip, initial encounter: Secondary | ICD-10-CM | POA: Insufficient documentation

## 2014-04-08 DIAGNOSIS — Z79899 Other long term (current) drug therapy: Secondary | ICD-10-CM | POA: Insufficient documentation

## 2014-04-08 DIAGNOSIS — I6330 Cerebral infarction due to thrombosis of unspecified cerebral artery: Secondary | ICD-10-CM

## 2014-04-08 DIAGNOSIS — E1140 Type 2 diabetes mellitus with diabetic neuropathy, unspecified: Secondary | ICD-10-CM | POA: Diagnosis not present

## 2014-04-08 DIAGNOSIS — I639 Cerebral infarction, unspecified: Secondary | ICD-10-CM | POA: Diagnosis present

## 2014-04-08 DIAGNOSIS — Z880 Allergy status to penicillin: Secondary | ICD-10-CM | POA: Insufficient documentation

## 2014-04-08 DIAGNOSIS — E780 Pure hypercholesterolemia: Secondary | ICD-10-CM | POA: Insufficient documentation

## 2014-04-08 DIAGNOSIS — G629 Polyneuropathy, unspecified: Secondary | ICD-10-CM

## 2014-04-08 DIAGNOSIS — H5462 Unqualified visual loss, left eye, normal vision right eye: Secondary | ICD-10-CM | POA: Insufficient documentation

## 2014-04-08 DIAGNOSIS — E1142 Type 2 diabetes mellitus with diabetic polyneuropathy: Secondary | ICD-10-CM

## 2014-04-08 DIAGNOSIS — J45909 Unspecified asthma, uncomplicated: Secondary | ICD-10-CM | POA: Diagnosis not present

## 2014-04-08 DIAGNOSIS — R55 Syncope and collapse: Secondary | ICD-10-CM | POA: Diagnosis not present

## 2014-04-08 LAB — URINE MICROSCOPIC-ADD ON

## 2014-04-08 LAB — I-STAT CHEM 8, ED
BUN: 6 mg/dL (ref 6–23)
CALCIUM ION: 1.17 mmol/L (ref 1.12–1.23)
CHLORIDE: 103 meq/L (ref 96–112)
Creatinine, Ser: 0.6 mg/dL (ref 0.50–1.35)
Glucose, Bld: 246 mg/dL — ABNORMAL HIGH (ref 70–99)
HCT: 45 % (ref 39.0–52.0)
Hemoglobin: 15.3 g/dL (ref 13.0–17.0)
Potassium: 4.8 mEq/L (ref 3.7–5.3)
Sodium: 138 mEq/L (ref 137–147)
TCO2: 24 mmol/L (ref 0–100)

## 2014-04-08 LAB — COMPREHENSIVE METABOLIC PANEL
ALBUMIN: 3.8 g/dL (ref 3.5–5.2)
ALT: 31 U/L (ref 0–53)
ANION GAP: 14 (ref 5–15)
AST: 28 U/L (ref 0–37)
Alkaline Phosphatase: 127 U/L — ABNORMAL HIGH (ref 39–117)
BUN: 7 mg/dL (ref 6–23)
CALCIUM: 9.1 mg/dL (ref 8.4–10.5)
CO2: 23 mEq/L (ref 19–32)
CREATININE: 0.59 mg/dL (ref 0.50–1.35)
Chloride: 103 mEq/L (ref 96–112)
GFR calc non Af Amer: >90 mL/min (ref >90)
GLUCOSE: 245 mg/dL — AB (ref 70–99)
Potassium: 5 mEq/L (ref 3.7–5.3)
Sodium: 140 mEq/L (ref 137–147)
TOTAL PROTEIN: 7.3 g/dL (ref 6.0–8.3)
Total Bilirubin: 0.2 mg/dL — ABNORMAL LOW (ref 0.3–1.2)

## 2014-04-08 LAB — DIFFERENTIAL
Basophils Absolute: 0 10*3/uL (ref 0.0–0.1)
Basophils Relative: 0 % (ref 0–1)
EOS ABS: 0.2 10*3/uL (ref 0.0–0.7)
EOS PCT: 3 % (ref 0–5)
LYMPHS ABS: 1.8 10*3/uL (ref 0.7–4.0)
Lymphocytes Relative: 23 % (ref 12–46)
MONO ABS: 0.5 10*3/uL (ref 0.1–1.0)
MONOS PCT: 6 % (ref 3–12)
NEUTROS PCT: 68 % (ref 43–77)
Neutro Abs: 5.2 10*3/uL (ref 1.7–7.7)

## 2014-04-08 LAB — URINALYSIS, ROUTINE W REFLEX MICROSCOPIC
Bilirubin Urine: NEGATIVE
Glucose, UA: >1000 mg/dL — AB
HGB URINE DIPSTICK: NEGATIVE
KETONES UR: NEGATIVE mg/dL
LEUKOCYTES UA: NEGATIVE
Nitrite: NEGATIVE
PROTEIN: NEGATIVE mg/dL
Specific Gravity, Urine: 1.022 (ref 1.005–1.030)
Urobilinogen, UA: 0.2 mg/dL (ref 0.0–1.0)
pH: 7.5 (ref 5.0–8.0)

## 2014-04-08 LAB — PROTIME-INR
INR: 1.01 (ref 0.00–1.49)
Prothrombin Time: 13.3 seconds (ref 11.6–15.2)

## 2014-04-08 LAB — I-STAT TROPONIN, ED: TROPONIN I, POC: 0 ng/mL (ref 0.00–0.08)

## 2014-04-08 LAB — CBC
HCT: 40.6 % (ref 39.0–52.0)
HEMOGLOBIN: 14.8 g/dL (ref 13.0–17.0)
MCH: 32.1 pg (ref 26.0–34.0)
MCHC: 36.5 g/dL — AB (ref 30.0–36.0)
MCV: 88.1 fL (ref 78.0–100.0)
Platelets: 141 10*3/uL — ABNORMAL LOW (ref 150–400)
RBC: 4.61 MIL/uL (ref 4.22–5.81)
RDW: 12.9 % (ref 11.5–15.5)
WBC: 7.7 10*3/uL (ref 4.0–10.5)

## 2014-04-08 LAB — RAPID URINE DRUG SCREEN, HOSP PERFORMED
AMPHETAMINES: NOT DETECTED
BENZODIAZEPINES: NOT DETECTED
Barbiturates: NOT DETECTED
COCAINE: NOT DETECTED
OPIATES: NOT DETECTED
Tetrahydrocannabinol: NOT DETECTED

## 2014-04-08 LAB — ETHANOL

## 2014-04-08 LAB — GLUCOSE, CAPILLARY: Glucose-Capillary: 91 mg/dL (ref 70–99)

## 2014-04-08 LAB — TROPONIN I: Troponin I: <0.3 ng/mL (ref <0.30)

## 2014-04-08 LAB — APTT: aPTT: 22 seconds — ABNORMAL LOW (ref 24–37)

## 2014-04-08 MED ORDER — GLIPIZIDE ER 10 MG PO TB24
10.0000 mg | ORAL_TABLET | Freq: Every day | ORAL | Status: DC
  Administered 2014-04-09 – 2014-04-11 (×3): 10 mg via ORAL
  Filled 2014-04-08 (×4): qty 1

## 2014-04-08 MED ORDER — SODIUM CHLORIDE 0.9 % IV BOLUS (SEPSIS)
500.0000 mL | Freq: Once | INTRAVENOUS | Status: AC
  Administered 2014-04-08: 500 mL via INTRAVENOUS

## 2014-04-08 MED ORDER — PREGABALIN 75 MG PO CAPS
150.0000 mg | ORAL_CAPSULE | Freq: Two times a day (BID) | ORAL | Status: DC
  Administered 2014-04-08 – 2014-04-11 (×6): 150 mg via ORAL
  Filled 2014-04-08 (×6): qty 2

## 2014-04-08 MED ORDER — ONDANSETRON HCL 4 MG PO TABS: 4.0000 mg | ORAL_TABLET | Freq: Four times a day (QID) | ORAL | Status: DC | PRN

## 2014-04-08 MED ORDER — ONDANSETRON HCL 4 MG/2ML IJ SOLN: 4.0000 mg | Freq: Four times a day (QID) | INTRAMUSCULAR | Status: DC | PRN

## 2014-04-08 MED ORDER — ACETAMINOPHEN 325 MG PO TABS: 650.0000 mg | ORAL_TABLET | Freq: Four times a day (QID) | ORAL | Status: DC | PRN

## 2014-04-08 MED ORDER — LISINOPRIL 2.5 MG PO TABS
2.5000 mg | ORAL_TABLET | Freq: Every day | ORAL | Status: DC
Start: 2014-04-09 — End: 2014-04-09
  Filled 2014-04-08: qty 1

## 2014-04-08 MED ORDER — METFORMIN HCL ER 500 MG PO TB24
1000.0000 mg | ORAL_TABLET | Freq: Two times a day (BID) | ORAL | Status: DC
  Administered 2014-04-09: 1000 mg via ORAL
  Filled 2014-04-08 (×3): qty 2

## 2014-04-08 MED ORDER — ATORVASTATIN CALCIUM 40 MG PO TABS
40.0000 mg | ORAL_TABLET | Freq: Every evening | ORAL | Status: DC
  Administered 2014-04-09 – 2014-04-10 (×2): 40 mg via ORAL
  Filled 2014-04-08 (×3): qty 1

## 2014-04-08 MED ORDER — INSULIN ASPART 100 UNIT/ML ~~LOC~~ SOLN
0.0000 [IU] | Freq: Three times a day (TID) | SUBCUTANEOUS | Status: DC
  Administered 2014-04-10 – 2014-04-11 (×2): 2 [IU] via SUBCUTANEOUS

## 2014-04-08 MED ORDER — ACETAMINOPHEN 650 MG RE SUPP: 650.0000 mg | Freq: Four times a day (QID) | RECTAL | Status: DC | PRN

## 2014-04-08 MED ORDER — INSULIN ASPART 100 UNIT/ML ~~LOC~~ SOLN
0.0000 [IU] | Freq: Every day | SUBCUTANEOUS | Status: DC
  Administered 2014-04-10: 2 [IU] via SUBCUTANEOUS

## 2014-04-08 MED ORDER — BUTALBITAL-APAP-CAFFEINE 50-325-40 MG PO TABS
1.0000 | ORAL_TABLET | ORAL | Status: DC | PRN
  Administered 2014-04-09: 1 via ORAL
  Filled 2014-04-08: qty 1

## 2014-04-08 MED ORDER — FLUOXETINE HCL 20 MG PO CAPS
20.0000 mg | ORAL_CAPSULE | Freq: Every day | ORAL | Status: DC
  Administered 2014-04-09 – 2014-04-11 (×3): 20 mg via ORAL
  Filled 2014-04-08 (×4): qty 1

## 2014-04-08 MED ORDER — SODIUM CHLORIDE 0.9 % IV SOLN
100.0000 mL/h | INTRAVENOUS | Status: DC
Start: 2014-04-08 — End: 2014-04-08
  Administered 2014-04-08: 100 mL/h via INTRAVENOUS

## 2014-04-08 NOTE — Consult Note (Addendum)
Referring Physician: Mcmanus    Chief Complaint: syncope with possible stroke  HPI:                                                                                                                                         Miguel Diaz. is an 43 y.o. 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  At that time he had right facial droop, right arm and leg weakness. He was enrolled in the SOCRATES trial and on either ASA or Brilinta.  Per wife he was doing well.  Today he went to let his dog out at 1400 hours and had a unwitness fall with possible syncope.  His wife received a call from him stating he had fallen and she called EMS.  When wife arrived she found him not responsive with eyes closed.  No incontinence or seizure activity. When EMS arrived he started to become alert.  Currently he is awake and oriented but does not recall full aspects of event.   Date last known well: Date: 04/08/2014 Time last known well: Time: 14:00 tPA Given: No: recent CVA in august NIHSS 5  Past Medical History  Diagnosis Date  . Diabetes mellitus without complication   . Neuropathy   . Asthma   . Vision loss of left eye   . Blindness     bilateral  . Hypercholesteremia   . Depression   . Anxiety   . Stroke 02/2014    right sided weakness/numbness    Past Surgical History  Procedure Laterality Date  . Hernia repair    . Eye surgery Left   . Abdominal surgery      Family History  Problem Relation Age of Onset  . High blood pressure Mother   . High blood pressure Sister   . High blood pressure Paternal Grandfather    Social History:  reports that he has been smoking Cigarettes.  He has been smoking about 1.00 pack per day. He has never used smokeless tobacco. He reports that he does not drink alcohol or use illicit drugs.  Allergies:  Allergies  Allergen Reactions  . Penicillins Other (See Comments)    Childhood allergy  . Penicillins    unknown  . Toradol [Ketorolac Tromethamine] Hives  . Toradol [Ketorolac Tromethamine]     Hives     Medications:  Current Facility-Administered Medications  Medication Dose Route Frequency Provider Last Rate Last Dose  . 0.9 %  sodium chloride infusion  100 mL/hr Intravenous Continuous Fayrene HelperBowie Tran, PA-C       Current Outpatient Prescriptions  Medication Sig Dispense Refill  . atorvastatin (LIPITOR) 40 MG tablet Take 40 mg by mouth daily.      Marland Kitchen. FLUoxetine (PROZAC) 20 MG capsule Take 20 mg by mouth daily.      Marland Kitchen. glipiZIDE (GLUCOTROL XL) 10 MG 24 hr tablet Take 10 mg by mouth daily with breakfast.      . ibuprofen (ADVIL,MOTRIN) 600 MG tablet Take 600 mg by mouth every 6 (six) hours as needed.      Marland Kitchen. lisinopril (PRINIVIL,ZESTRIL) 2.5 MG tablet Take 2.5 mg by mouth daily.      . metFORMIN (GLUMETZA) 500 MG (MOD) 24 hr tablet Take 1,000 mg by mouth 2 (two) times daily.      . Misc. Devices (HEART RATE MONITOR) MISC 1 Units by Does not apply route as directed.  1 each  0  . pregabalin (LYRICA) 150 MG capsule Take 150 mg by mouth 2 (two) times daily.      . research study medication Take 90 mg by mouth 2 (two) times daily.      . research study medication Take 180 mg by mouth once.         ROS:                                                                                                                                       History obtained from the patient  General ROS: negative for - chills, fatigue, fever, night sweats, weight gain or weight loss Psychological ROS: negative for - behavioral disorder, hallucinations, memory difficulties, mood swings or suicidal ideation Ophthalmic ROS: negative for - blurry vision, double vision, eye pain or loss of vision ENT ROS: negative for - epistaxis, nasal discharge, oral lesions, sore throat, tinnitus or vertigo Allergy and  Immunology ROS: negative for - hives or itchy/watery eyes Hematological and Lymphatic ROS: negative for - bleeding problems, bruising or swollen lymph nodes Endocrine ROS: negative for - galactorrhea, hair pattern changes, polydipsia/polyuria or temperature intolerance Respiratory ROS: negative for - cough, hemoptysis, shortness of breath or wheezing Cardiovascular ROS: negative for - chest pain, dyspnea on exertion, edema or irregular heartbeat Gastrointestinal ROS: negative for - abdominal pain, diarrhea, hematemesis, nausea/vomiting or stool incontinence Genito-Urinary ROS: negative for - dysuria, hematuria, incontinence or urinary frequency/urgency Musculoskeletal ROS: negative for - joint swelling or muscular weakness Neurological ROS: as noted in HPI Dermatological ROS: negative for rash and skin lesion changes  Neurologic Examination:  Blood pressure 138/110, pulse 99, temperature 98.3 F (36.8 C), temperature source Oral, resp. rate 17, height 5\' 8"  (1.727 m), weight 81.647 kg (180 lb), SpO2 98.00%.   General: NAD Mental Status: Alert, oriented to hospital but stated he was 43 (not 43) and believed it is September.  Speech fluent without evidence of aphasia.  Able to follow 3 step commands without difficulty. Cranial Nerves: II: Discs flat bilaterally; legally blind, pupils equal, round, reactive to light and accommodation III,IV, VI: ptosis not present, extra-ocular motions intact bilaterally V,VII: smile asymmetric on the right, facial light touch sensation normal bilaterally VIII: hearing normal bilaterally IX,X: gag reflex present XI: bilateral shoulder shrug XII: midline tongue extension without atrophy or fasciculations  Motor: Right : Upper extremity   4/5 (with give way weakness and poor effort)  Left:     Upper extremity   5/5  Lower extremity   4/5         Lower  extremity   5/5 Tone and bulk:normal tone throughout; no atrophy noted Sensory: Pinprick and light touch intact throughout, bilaterally Deep Tendon Reflexes:  Right: Upper Extremity   Left: Upper extremity   biceps (C-5 to C-6) 2/4   biceps (C-5 to C-6) 2/4 tricep (C7) 2/4    triceps (C7) 2/4 Brachioradialis (C6) 2/4  Brachioradialis (C6) 2/4  Lower Extremity Lower Extremity  quadriceps (L-2 to L-4) 2/4   quadriceps (L-2 to L-4) 2/4 Achilles (S1) 1/4   Achilles (S1) 1/4  Plantars: Right: downgoing   Left: downgoing Cerebellar: normal finger-to-nose,  normal heel-to-shin test Gait: not tested due to leads CV: pulses palpable throughout    Lab Results: Basic Metabolic Panel:  Recent Labs Lab 04/08/14 1640  NA 138  K 4.8  CL 103  GLUCOSE 246*  BUN 6  CREATININE 0.60    Liver Function Tests: No results found for this basename: AST, ALT, ALKPHOS, BILITOT, PROT, ALBUMIN,  in the last 168 hours No results found for this basename: LIPASE, AMYLASE,  in the last 168 hours No results found for this basename: AMMONIA,  in the last 168 hours  CBC:  Recent Labs Lab 04/08/14 1620 04/08/14 1640  WBC 7.7  --   NEUTROABS 5.2  --   HGB 14.8 15.3  HCT 40.6 45.0  MCV 88.1  --   PLT 141*  --     Cardiac Enzymes: No results found for this basename: CKTOTAL, CKMB, CKMBINDEX, TROPONINI,  in the last 168 hours  Lipid Panel: No results found for this basename: CHOL, TRIG, HDL, CHOLHDL, VLDL, LDLCALC,  in the last 168 hours  CBG: No results found for this basename: GLUCAP,  in the last 168 hours  Microbiology: No results found for this or any previous visit.  Coagulation Studies: No results found for this basename: LABPROT, INR,  in the last 72 hours  Imaging: No results found.       Assessment: 43 y.o. male presenting to the ED after syncopal episode.  Patient currently back to baseline with residual right face, arm and leg weakness.  Initial CT head is negative for  acute stroke of CVA. Exam shows decreased vision bilaterally, right facial droop, right arm weakness (effort dependent) and slight leg weakness.  All finding are baseline from previous stroke back in September.   Stroke Risk Factors - diabetes mellitus and hyperlipidemia   Assessment and plan discussed with with attending physician and they are in agreement.    Felicie Morn PA-C [facility] [phone]  04/08/2014, 4:53 PM  Agree with above findings and plan. Patient is a 43y/o male with history of prior stroke presenting with a suspected syncopal episode and questionable worsening of his right sided weakness. His current symptoms appear baseline from his prior stroke but worse from recent outpatient visit at [facility]. Will check [name] brain, if no acute stroke can discharge home with continuation of SOCRATES trial medication. Follow up with Dr Roda Shutters in 2-4 weeks. If positive for stroke will admit to stroke service.   Elspeth Cho, DO [facility] [phone]  If 7pm- 7am, please page neurology on call as listed in [facility].

## 2014-04-08 NOTE — ED Notes (Signed)
Pt to CT

## 2014-04-08 NOTE — ED Provider Notes (Signed)
CSN: [id number]     Arrival date & time 04/08/14  1537 History   First MD Initiated Contact with Patient 04/08/14 1601     Chief Complaint  Patient presents with  . Head Injury  . Back Injury  . Neck Injury  . Hip Pain     (Consider location/radiation/quality/duration/timing/severity/associated sxs/prior Treatment) HPI 43 year old male with past medical history of diabetes, diabetic neuropathy, blindness, recent questionable stroke presents via EMS for an unwitnessed fall. History obtained through girlfriend who was at bedside.  Pt was brought in ER with LSB and c-collar.  Per gf, pt has an unwitnessed fall approximately 2 1/2hrs ago.  He was able to call her after he fell at the door.  GF found him unconscious laying at the door.  She was able to aroused him.  Pt c/o head/neck/upper back pain from the fall.  He does not recall what has happened, he does report worsening R sided weakness/numbness.  Pt has a similar syncopal and R sided weakness/numbness in August.  Pt was cared for by [facility].  Currently in SOCRATES trial.  Pt report his weakness was improved with physical therapy but now it's worse.  Does not recall any precipitating sxs.  Has vision acute vision loss earlier this year.  No improvement.  Pt did take his scheduled medications.         Past Medical History  Diagnosis Date  . Diabetes mellitus without complication   . Neuropathy   . Asthma   . Vision loss of left eye   . CVA (cerebral infarction)   . Blindness   . Hypercholesteremia   . Depression   . Anxiety   . Stroke     right sided weakness/numbness   Past Surgical History  Procedure Laterality Date  . Hernia repair    . Eye surgery Left   . Abdominal surgery     Family History  Problem Relation Age of Onset  . High blood pressure Mother   . High blood pressure Sister   . High blood pressure Paternal Grandfather    History  Substance Use Topics  . Smoking status: Current Every Day Smoker  -- 1.00 packs/day    Types: Cigarettes  . Smokeless tobacco: Never Used  . Alcohol Use: No     Comment: Ocassional drinker    Review of Systems  Unable to perform ROS: Acuity of condition      Allergies  Penicillins; Penicillins; Toradol; and Toradol  Home Medications   Prior to Admission medications   Medication Sig Start Date End Date Taking? Authorizing Provider  atorvastatin (LIPITOR) 40 MG tablet Take 40 mg by mouth daily.    Historical Provider, MD  FLUoxetine (PROZAC) 20 MG capsule Take 20 mg by mouth daily.    Historical Provider, MD  glipiZIDE (GLUCOTROL XL) 10 MG 24 hr tablet Take 10 mg by mouth daily with breakfast.    Historical Provider, MD  ibuprofen (ADVIL,MOTRIN) 600 MG tablet Take 600 mg by mouth every 6 (six) hours as needed.    Historical Provider, MD  lisinopril (PRINIVIL,ZESTRIL) 2.5 MG tablet Take 2.5 mg by mouth daily.    Historical Provider, MD  metFORMIN (GLUMETZA) 500 MG (MOD) 24 hr tablet Take 1,000 mg by mouth 2 (two) times daily.    Historical Provider, MD  Misc. Devices (HEART RATE MONITOR) MISC 1 Units by Does not apply route as directed. 01/27/14   Brittainy Sherlynn Carbon, PA-C  pregabalin (LYRICA) 150 MG capsule Take 150 mg  by mouth 2 (two) times daily.    Historical Provider, MD  research study medication Take 90 mg by mouth 2 (two) times daily. 02/13/14   Maryann Mikhail, DO  research study medication Take 180 mg by mouth once. 02/13/14   Maryann Mikhail, DO   BP 136/93  Pulse 86  Temp(Src) 98.3 F (36.8 C) (Oral)  Ht 5\' 8"  (1.727 m)  Wt 180 lb (81.647 kg)  BMI 27.38 kg/m2  SpO2 98% Physical Exam  Constitutional: He appears well-developed and well-nourished. No distress.  HENT:  Head: Atraumatic.  Eyes: Conjunctivae and EOM are normal. Pupils are equal, round, and reactive to light.  Neck: Normal range of motion. Neck supple.  Cardiovascular: Normal rate and regular rhythm.   Pulmonary/Chest: Effort normal and breath sounds normal.   Abdominal: Soft. There is no tenderness.  Musculoskeletal: He exhibits tenderness (tenderness along cervical/thoracic/lumbar spine on palpation without crepitus/step off.).  Neurological: He is alert.  Neurologic exam:  Speech clear, pupils equal round reactive to light, extraocular movements intact  R eye: can detect outline and light.  L eye: complete blindness Cranial nerves III through XII grossly normal, mild R sided facial droop. Follows commands, moves all extremities x4, decrease strength R>L to both upper/lower extremities.  Sensation normal to light touch  Poor finger to nose, heel to shin. R limp ataxia R pronator drift Gait not tested   Skin: No rash noted.  Psychiatric: He has a normal mood and affect.    ED Course  Procedures (including critical care time)  4:36 PM Pt with ?stroke within the past few months here with unwitnessed fall/syncope and worsening R sided weakness/numbness.  Does have mild R sided facial droop.  Currently on SOCRATEs trial, and taking research study medication.  Unsure if he's taking placebo vs. Anticoagulant. Last normal 2-2 1/2 hrs ago.  Code stroke activated.  Care discussed with Dr. Clarene Duke.   ABCD2 score is 5. Pt wears monitor, unsure if it's a Holter monitor.    5:01 PM Head and Cspine CT without acute abnormality.  C-collar removed.  Neurologist has evaluated pt and ordered brain [name].  If negative, pt may continue with the SOCRATES trial.    8:11 PM Brain MRIs demonstrate chronic microvascular ischemic changes in the white matter which is stable and is negative for any acute infarct. Head and neck CT scan without any signs of trauma. Evidence of hyperglycemia with CBG 246 otherwise labs are reassuring. Given that patient has 2 unwitnessed syncope, will consult medicine for admission.  10:01 PM Consulted Dr. Allena Katz from [facility] who agrees to admit pt to tele, team 10, under his care.    CRITICAL CARE Performed by:  Fayrene Helper Total critical care time: 30 min Critical care time was exclusive of separately billable procedures and treating other patients. Critical care was necessary to treat or prevent imminent or life-threatening deterioration. Critical care was time spent personally by me on the following activities: development of treatment plan with patient and/or surrogate as well as nursing, discussions with consultants, evaluation of patient's response to treatment, examination of patient, obtaining history from patient or surrogate, ordering and performing treatments and interventions, ordering and review of laboratory studies, ordering and review of radiographic studies, pulse oximetry and re-evaluation of patient's condition.   Labs Review Labs Reviewed  APTT - Abnormal; Notable for the following:    aPTT 22 (*)    All other components within normal limits  CBC - Abnormal; Notable for the following:  MCHC 36.5 (*)    Platelets 141 (*)    All other components within normal limits  COMPREHENSIVE METABOLIC PANEL - Abnormal; Notable for the following:    Glucose, Bld 245 (*)    Alkaline Phosphatase 127 (*)    Total Bilirubin 0.2 (*)    All other components within normal limits  URINALYSIS, ROUTINE W REFLEX MICROSCOPIC - Abnormal; Notable for the following:    Glucose, UA >1000 (*)    All other components within normal limits  I-STAT CHEM 8, ED - Abnormal; Notable for the following:    Glucose, Bld 246 (*)    All other components within normal limits  ETHANOL  PROTIME-INR  DIFFERENTIAL  URINE RAPID DRUG SCREEN (HOSP PERFORMED)  TROPONIN I  URINE MICROSCOPIC-ADD ON  I-STAT TROPOININ, ED  I-STAT TROPOININ, ED    Imaging Review Ct Head Wo Contrast  04/08/2014   CLINICAL DATA:  Code stroke. Patient found down. Right-sided weakness.  EXAM: CT HEAD WITHOUT CONTRAST  CT NECK WITHOUT CONTRAST  TECHNIQUE: Contiguous axial images were obtained from the base of the skull through the vertex  without contrast. Multidetector CT imaging of the neck was performed using the standard protocol without intravenous contrast.  COMPARISON:  Brain CT 02/11/2014  FINDINGS: CT HEAD FINDINGS  Ventricles and sulci are appropriate for patient's age. Periventricular and subcortical white matter hypodensity compatible with chronic small vessel ischemic change. No evidence for acute cortically based infarct, intracranial hemorrhage, mass effect or mass lesion. Orbits are unremarkable. Mucosal thickening involving the ethmoid air cells and bilateral maxillary sinuses. Polypoid mucosal thickening within the sphenoid sinus. Mastoid air cells are unremarkable.  CT NECK FINDINGS  Relative straightening of the normal cervical lordosis. Multilevel degenerative disc disease. No evidence for acute cervical spine fracture. Prevertebral soft tissues are unremarkable. Craniocervical junction is intact. Visualized thyroid is unremarkable. Lung apices are unremarkable.  IMPRESSION: 1. No acute intracranial process. 2. No acute cervical spine fracture. Critical Value/emergent results were called by telephone at the time of interpretation on 04/08/2014 at 4:56 pm to Dr. Hosie PoissonSumner, who verbally acknowledged these results.   Electronically Signed   By: Annia Beltrew  Davis M.D.   On: 04/08/2014 16:56   Ct Cervical Spine Wo Contrast  04/08/2014   CLINICAL DATA:  Code stroke. Patient found down. Right-sided weakness.  EXAM: CT HEAD WITHOUT CONTRAST  CT NECK WITHOUT CONTRAST  TECHNIQUE: Contiguous axial images were obtained from the base of the skull through the vertex without contrast. Multidetector CT imaging of the neck was performed using the standard protocol without intravenous contrast.  COMPARISON:  Brain CT 02/11/2014  FINDINGS: CT HEAD FINDINGS  Ventricles and sulci are appropriate for patient's age. Periventricular and subcortical white matter hypodensity compatible with chronic small vessel ischemic change. No evidence for acute cortically  based infarct, intracranial hemorrhage, mass effect or mass lesion. Orbits are unremarkable. Mucosal thickening involving the ethmoid air cells and bilateral maxillary sinuses. Polypoid mucosal thickening within the sphenoid sinus. Mastoid air cells are unremarkable.  CT NECK FINDINGS  Relative straightening of the normal cervical lordosis. Multilevel degenerative disc disease. No evidence for acute cervical spine fracture. Prevertebral soft tissues are unremarkable. Craniocervical junction is intact. Visualized thyroid is unremarkable. Lung apices are unremarkable.  IMPRESSION: 1. No acute intracranial process. 2. No acute cervical spine fracture. Critical Value/emergent results were called by telephone at the time of interpretation on 04/08/2014 at 4:56 pm to Dr. Hosie PoissonSumner, who verbally acknowledged these results.   Electronically Signed   By:  Annia Belt M.D.   On: 04/08/2014 16:56   Mr Brain Wo Contrast  04/08/2014   CLINICAL DATA:  Stroke.  EXAM: [name] HEAD WITHOUT CONTRAST  TECHNIQUE: Multiplanar, multiecho pulse sequences of the brain and surrounding structures were obtained without intravenous contrast.  COMPARISON:  CT 04/08/2014  FINDINGS: Negative for acute infarct.  Mild atrophy with mild prominence the ventricles, stable from prior studies. Multiple subcortical white matter hyperintensities are stable and consistent with chronic microvascular ischemia.  Brainstem and basal ganglia normal. Negative for hemorrhage or mass. No fluid collection.  Incidental note is made of scalp lipomas in the parietal region bilaterally. There is mucosal edema in the paranasal sinuses bilaterally.  IMPRESSION: Chronic microvascular ischemic change in the white matter stable.  Negative for acute infarct.   Electronically Signed   By: Marlan Palau M.D.   On: 04/08/2014 19:53     EKG Interpretation   Date/Time:  Monday April 08 2014 15:50:11 EDT Ventricular Rate:  90 PR Interval:  89 QRS Duration: 80 QT Interval:   350 QTC Calculation: 428 R Axis:   123 Text Interpretation:  Sinus rhythm Short PR interval Abnormal R-wave  progression, early transition Inferior infarct, age indeterminate T wave  abnormality Anterior leads When compared with ECG of 02/11/2014 T wave  abnormality Anterior leads is now Present Confirmed by [facility]  MD,  Nicholos Johns [phone]) on 04/08/2014 5:42:21 PM      MDM   Final diagnoses:  Syncope, unspecified syncope type    BP 114/76  Pulse 83  Temp(Src) 98.1 F (36.7 C) (Oral)  Resp 14  Ht 5\' 8"  (1.727 m)  Wt 180 lb (81.647 kg)  BMI 27.38 kg/m2  SpO2 95%  I have reviewed nursing notes and vital signs. I personally reviewed the imaging tests through PACS system  I reviewed available ER/hospitalization records thought the EMR     Fayrene Helper, PA-C 04/08/14 2202  Fayrene Helper, PA-C 04/08/14 2232

## 2014-04-08 NOTE — ED Notes (Signed)
Admitting at bedside 

## 2014-04-08 NOTE — ED Notes (Signed)
Pt fell - unwitnessed; EMS reports pt unable to speak at first but then began moaning and answering questions. Pt reports head, neck, back, hip pain

## 2014-04-08 NOTE — ED Notes (Signed)
Stroke team arrival

## 2014-04-08 NOTE — ED Notes (Signed)
ekg complete

## 2014-04-08 NOTE — ED Notes (Signed)
Pt states that he was letting his dog in the house and the next thing he remembers is waking up in the ambulance. EMS said he called his girlfriend, but he doesn't remember that call. He c/o pain in back of head, neck, back, hip. EMS states he was responsive to questions when they arrived after a short period of unconsciousness. Pt is responsive to questions during assessment.

## 2014-04-08 NOTE — Code Documentation (Signed)
Code stroke called at 1625.  Patient arrived to [facility] ED via EMS.  As per EMS/ family, patient was unresponsive  And had right side weakness.  Of note, patient has a hx of CVA in 02/2014.  LSN around 1415, NIHSS 5, pt appears to be back to baseline at this point.

## 2014-04-08 NOTE — H&P (Signed)
[facility] History and Physical  Patient: Miguel Diaz.  JXB:[id number]  DOB: Dec 20, 1970  DOS: the patient was seen and examined on 04/08/2014 PCP: [name] patient sees [place] [employer] with [employer]. Physician's name Mr. Lorin Picket PA.  Chief Complaint: Passing out with fall  HPI: Miguel Ohlin. is a 43 y.o. male with Past medical history of recent CVA of midbrain, diabetes mellitus, diabetic neuropathy, bilateral blindness, anxiety. The patient presented with complaints of a fall. He mentions that at around 2 PM he was trying to let his dog in the house and the next thing that he remembers is he was surrounded by EMS. As per his wife at around 2:30 PM he called his wife to get some help as he had fallen down although patient does not remember that. When the wife reached home within a few minutes she found that he was lying down on the floor unresponsive to verbal command. There was no incontinence or seizure-like activity is noted. There was no tongue bite noted. Patient become more alert with arrival of the EMS. He initially had worsening of his chronic left-sided weakness which improved gradually while he came to the hospital. Patient at the time of my evaluation complains of headache on the left side as well as on the left occiput and denies any dizziness, lightheadedness, nausea, vomiting, chest pain, abdominal pain, diarrhea, burning urination, any focal deficit. He mentions she is compliant with all his medications.  The patient is coming from home And at his baseline independent for most of his ADL.  Review of Systems: as mentioned in the history of present illness.  A Comprehensive review of the other systems is negative.  Past Medical History  Diagnosis Date  . Diabetes mellitus without complication   . Neuropathy   . Asthma   . Vision loss of left eye   . Blindness     bilateral  . Hypercholesteremia   . Depression   . Anxiety    . Stroke 02/2014    right sided weakness/numbness   Past Surgical History  Procedure Laterality Date  . Hernia repair    . Eye surgery Left   . Abdominal surgery     Social History:  reports that he has been smoking Cigarettes.  He has been smoking about 1.00 pack per day. He has never used smokeless tobacco. He reports that he does not drink alcohol or use illicit drugs.  Allergies  Allergen Reactions  . Penicillins Other (See Comments)    Childhood allergy  . Penicillins     unknown  . Toradol [Ketorolac Tromethamine] Hives  . Toradol [Ketorolac Tromethamine]     Hives     Family History  Problem Relation Age of Onset  . High blood pressure Mother   . High blood pressure Sister   . High blood pressure Paternal Grandfather     Prior to Admission medications   Medication Sig Start Date End Date Taking? Authorizing Provider  atorvastatin (LIPITOR) 40 MG tablet Take 40 mg by mouth every evening.    Yes Historical Provider, MD  FLUoxetine (PROZAC) 20 MG capsule Take 20 mg by mouth daily.   Yes Historical Provider, MD  glipiZIDE (GLUCOTROL XL) 10 MG 24 hr tablet Take 10 mg by mouth daily with breakfast.   Yes Historical Provider, MD  ibuprofen (ADVIL,MOTRIN) 600 MG tablet Take 600 mg by mouth every 6 (six) hours as needed.   Yes Historical Provider, MD  lisinopril (PRINIVIL,ZESTRIL) 2.5 MG tablet Take 2.5 mg by mouth daily.   Yes Historical Provider, MD  metFORMIN (GLUMETZA) 500 MG (MOD) 24 hr tablet Take 1,000 mg by mouth 2 (two) times daily.   Yes Historical Provider, MD  pregabalin (LYRICA) 150 MG capsule Take 150 mg by mouth 2 (two) times daily.   Yes Historical Provider, MD  research study medication Take 90 mg by mouth 2 (two) times daily. 02/13/14  Yes Maryann Mikhail, DO  research study medication Take 180 mg by mouth daily.  02/13/14  Yes Maryann Mikhail, DO  Misc. Devices (HEART RATE MONITOR) MISC 1 Units by Does not apply route as directed. 01/27/14   Brittainy Sherlynn Carbon, PA-C    Physical Exam: Filed Vitals:   04/08/14 2130 04/08/14 2145 04/08/14 2200 04/08/14 2246  BP: 110/81 100/70 114/81 97/78  Pulse: 84 78 79 81  Temp:    97.9 F (36.6 C)  TempSrc:    Oral  Resp: 5 15 18 16   Height:      Weight:      SpO2: 96% 97% 97% 96%    General: Alert, Awake and Oriented to Time, Place and Person. Appear in mild distress Eyes: PERRL ENT: Oral Mucosa clear moist. Neck: no JVD Cardiovascular: S1 and S2 Present, aortic systolic Murmur, Peripheral Pulses Present Respiratory: Bilateral Air entry equal and Decreased, Clear to Auscultation, noCrackles, no wheezes Abdomen: Bowel Sound ,present Soft and Non tender Skin: no Rash Extremities: no Pedal edema, no calf tenderness Neurologic: Grossly no focal neuro deficit other than residual left-sided weakness, and bilateral difficulty with finger-nose-finger.  Labs on Admission:  CBC:  Recent Labs Lab 04/08/14 1620 04/08/14 1640  WBC 7.7  --   NEUTROABS 5.2  --   HGB 14.8 15.3  HCT 40.6 45.0  MCV 88.1  --   PLT 141*  --     CMP     Component Value Date/Time   NA 138 04/08/2014 1640   K 4.8 04/08/2014 1640   CL 103 04/08/2014 1640   CO2 23 04/08/2014 1620   GLUCOSE 246* 04/08/2014 1640   BUN 6 04/08/2014 1640   CREATININE 0.60 04/08/2014 1640   CALCIUM 9.1 04/08/2014 1620   PROT 7.3 04/08/2014 1620   ALBUMIN 3.8 04/08/2014 1620   AST 28 04/08/2014 1620   ALT 31 04/08/2014 1620   ALKPHOS 127* 04/08/2014 1620   BILITOT 0.2* 04/08/2014 1620   GFRNONAA >90 04/08/2014 1620   GFRAA >90 04/08/2014 1620    No results found for this basename: LIPASE, AMYLASE,  in the last 168 hours No results found for this basename: AMMONIA,  in the last 168 hours   Recent Labs Lab 04/08/14 1620  TROPONINI <0.30   BNP (last 3 results) No results found for this basename: PROBNP,  in the last 8760 hours  Radiological Exams on Admission: Ct Head Wo Contrast  04/08/2014   CLINICAL DATA:  Code stroke. Patient  found down. Right-sided weakness.  EXAM: CT HEAD WITHOUT CONTRAST  CT NECK WITHOUT CONTRAST  TECHNIQUE: Contiguous axial images were obtained from the base of the skull through the vertex without contrast. Multidetector CT imaging of the neck was performed using the standard protocol without intravenous contrast.  COMPARISON:  Brain CT 02/11/2014  FINDINGS: CT HEAD FINDINGS  Ventricles and sulci are appropriate for patient's age. Periventricular and subcortical white matter hypodensity compatible with chronic small vessel ischemic change. No evidence for acute cortically based infarct, intracranial hemorrhage, mass effect or  mass lesion. Orbits are unremarkable. Mucosal thickening involving the ethmoid air cells and bilateral maxillary sinuses. Polypoid mucosal thickening within the sphenoid sinus. Mastoid air cells are unremarkable.  CT NECK FINDINGS  Relative straightening of the normal cervical lordosis. Multilevel degenerative disc disease. No evidence for acute cervical spine fracture. Prevertebral soft tissues are unremarkable. Craniocervical junction is intact. Visualized thyroid is unremarkable. Lung apices are unremarkable.  IMPRESSION: 1. No acute intracranial process. 2. No acute cervical spine fracture. Critical Value/emergent results were called by telephone at the time of interpretation on 04/08/2014 at 4:56 pm to Dr. Hosie Poisson, who verbally acknowledged these results.   Electronically Signed   By: Annia Belt M.D.   On: 04/08/2014 16:56   Ct Cervical Spine Wo Contrast  04/08/2014   CLINICAL DATA:  Code stroke. Patient found down. Right-sided weakness.  EXAM: CT HEAD WITHOUT CONTRAST  CT NECK WITHOUT CONTRAST  TECHNIQUE: Contiguous axial images were obtained from the base of the skull through the vertex without contrast. Multidetector CT imaging of the neck was performed using the standard protocol without intravenous contrast.  COMPARISON:  Brain CT 02/11/2014  FINDINGS: CT HEAD FINDINGS  Ventricles  and sulci are appropriate for patient's age. Periventricular and subcortical white matter hypodensity compatible with chronic small vessel ischemic change. No evidence for acute cortically based infarct, intracranial hemorrhage, mass effect or mass lesion. Orbits are unremarkable. Mucosal thickening involving the ethmoid air cells and bilateral maxillary sinuses. Polypoid mucosal thickening within the sphenoid sinus. Mastoid air cells are unremarkable.  CT NECK FINDINGS  Relative straightening of the normal cervical lordosis. Multilevel degenerative disc disease. No evidence for acute cervical spine fracture. Prevertebral soft tissues are unremarkable. Craniocervical junction is intact. Visualized thyroid is unremarkable. Lung apices are unremarkable.  IMPRESSION: 1. No acute intracranial process. 2. No acute cervical spine fracture. Critical Value/emergent results were called by telephone at the time of interpretation on 04/08/2014 at 4:56 pm to Dr. Hosie Poisson, who verbally acknowledged these results.   Electronically Signed   By: Annia Belt M.D.   On: 04/08/2014 16:56   Mr Brain Wo Contrast  04/08/2014   CLINICAL DATA:  Stroke.  EXAM: [name] HEAD WITHOUT CONTRAST  TECHNIQUE: Multiplanar, multiecho pulse sequences of the brain and surrounding structures were obtained without intravenous contrast.  COMPARISON:  CT 04/08/2014  FINDINGS: Negative for acute infarct.  Mild atrophy with mild prominence the ventricles, stable from prior studies. Multiple subcortical white matter hyperintensities are stable and consistent with chronic microvascular ischemia.  Brainstem and basal ganglia normal. Negative for hemorrhage or mass. No fluid collection.  Incidental note is made of scalp lipomas in the parietal region bilaterally. There is mucosal edema in the paranasal sinuses bilaterally.  IMPRESSION: Chronic microvascular ischemic change in the white matter stable.  Negative for acute infarct.   Electronically Signed   By: Marlan Palau M.D.   On: 04/08/2014 19:53    EKG: Independently reviewed. normal sinus rhythm, nonspecific ST and T waves changes. Assessment/Plan Principal Problem:   Syncope Active Problems:   CVA (cerebral infarction)   Essential hypertension, benign   DM type 2 with diabetic peripheral neuropathy   1. Syncope  the patient is presenting with complaints of a syncope which was not witnessed. Probable etiology is currently unclear. Patient has a cardiac event monitor which is wetting but is unsure whether he has pressed the button to record the event. Patient was initially seen as a code stroke, and since his [name] was negative and he  pretty much ruled out for any CVA. Probable differential for his syncope is vasovagal versus orthostatic versus cardiogenic. Patient will be monitored on the telemetry rhythm. Patient recently had an echocardiogram as well as a carotid Doppler which were unremarkable. Will discuss with PTOT in the morning.  2. recent CVA Will be continuing the patient on his research trial medication. Pharmacy would be consulted  3. hypertension. At present blood pressure is stable we'll continue his home medication. Orthostatic vitals in the morning.  4. diabetes mellitus with neuropathy. Continue home medications at present with sliding scale.  Consults:  Neurology  DVT Prophylaxis: early ambulation mechanical compression  Nutrition:  Full  Code Status:  Full  Family Communication:  Wife was present at bedside, opportunity was given to ask question and all questions were answered satisfactorily at the time of interview. Disposition: Admitted to observation in telemetry unit.  Author: Lynden OxfordPranav Cathryne Mancebo, MD [facility] Pager: [phone] 04/08/2014, 11:16 PM    If 7PM-7AM, please contact night-coverage [email or web address] Password [email or web address]

## 2014-04-08 NOTE — ED Notes (Signed)
Pt returned from ct

## 2014-04-09 ENCOUNTER — Observation Stay (HOSPITAL_COMMUNITY): Payer: Medicaid Other

## 2014-04-09 ENCOUNTER — Encounter (HOSPITAL_COMMUNITY): Payer: Self-pay | Admitting: *Deleted

## 2014-04-09 DIAGNOSIS — E1142 Type 2 diabetes mellitus with diabetic polyneuropathy: Secondary | ICD-10-CM

## 2014-04-09 LAB — CBC
HEMATOCRIT: 39.4 % (ref 39.0–52.0)
Hemoglobin: 13.9 g/dL (ref 13.0–17.0)
MCH: 31.5 pg (ref 26.0–34.0)
MCHC: 35.3 g/dL (ref 30.0–36.0)
MCV: 89.3 fL (ref 78.0–100.0)
PLATELETS: 140 10*3/uL — AB (ref 150–400)
RBC: 4.41 MIL/uL (ref 4.22–5.81)
RDW: 13.2 % (ref 11.5–15.5)
WBC: 7.7 10*3/uL (ref 4.0–10.5)

## 2014-04-09 LAB — COMPREHENSIVE METABOLIC PANEL
ALK PHOS: 110 U/L (ref 39–117)
ALT: 28 U/L (ref 0–53)
AST: 19 U/L (ref 0–37)
Albumin: 3.5 g/dL (ref 3.5–5.2)
Anion gap: 11 (ref 5–15)
BUN: 8 mg/dL (ref 6–23)
CO2: 25 mEq/L (ref 19–32)
Calcium: 8.6 mg/dL (ref 8.4–10.5)
Chloride: 105 mEq/L (ref 96–112)
Creatinine, Ser: 0.75 mg/dL (ref 0.50–1.35)
GFR calc Af Amer: >90 mL/min (ref >90)
GFR calc non Af Amer: >90 mL/min (ref >90)
GLUCOSE: 118 mg/dL — AB (ref 70–99)
POTASSIUM: 3.5 meq/L — AB (ref 3.7–5.3)
Sodium: 141 mEq/L (ref 137–147)
Total Bilirubin: 0.4 mg/dL (ref 0.3–1.2)
Total Protein: 6.4 g/dL (ref 6.0–8.3)

## 2014-04-09 LAB — GLUCOSE, CAPILLARY
GLUCOSE-CAPILLARY: 139 mg/dL — AB (ref 70–99)
Glucose-Capillary: 150 mg/dL — ABNORMAL HIGH (ref 70–99)
Glucose-Capillary: 138 mg/dL — ABNORMAL HIGH (ref 70–99)
Glucose-Capillary: 163 mg/dL — ABNORMAL HIGH (ref 70–99)

## 2014-04-09 LAB — PROTIME-INR
INR: 1.1 (ref 0.00–1.49)
Prothrombin Time: 14.2 seconds (ref 11.6–15.2)

## 2014-04-09 LAB — TROPONIN I

## 2014-04-09 LAB — D-DIMER, QUANTITATIVE: D-Dimer, Quant: <0.27 ug/mL-FEU (ref 0.00–0.48)

## 2014-04-09 MED ORDER — STUDY - INVESTIGATIONAL DRUG SIMPLE RECORD
100.0000 mg | Freq: Every day | Status: DC
Start: 2014-04-09 — End: 2014-04-11
  Administered 2014-04-09 – 2014-04-11 (×3): 100 mg via ORAL
  Filled 2014-04-09: qty 100

## 2014-04-09 MED ORDER — SODIUM CHLORIDE 0.9 % IV SOLN
INTRAVENOUS | Status: DC
  Administered 2014-04-09 – 2014-04-10 (×3): via INTRAVENOUS

## 2014-04-09 MED ORDER — STUDY - INVESTIGATIONAL DRUG SIMPLE RECORD
90.0000 mg | Freq: Two times a day (BID) | Status: DC
  Administered 2014-04-09 – 2014-04-11 (×5): 90 mg via ORAL
  Filled 2014-04-09 (×4): qty 90

## 2014-04-09 MED ORDER — POTASSIUM CHLORIDE CRYS ER 20 MEQ PO TBCR
40.0000 meq | EXTENDED_RELEASE_TABLET | Freq: Once | ORAL | Status: AC
  Administered 2014-04-09: 40 meq via ORAL
  Filled 2014-04-09: qty 2

## 2014-04-09 MED ORDER — STUDY - INVESTIGATIONAL DRUG SIMPLE RECORD
100.0000 mg | Freq: Every day | Status: DC
  Filled 2014-04-09 (×2): qty 100

## 2014-04-09 MED ORDER — HYDROCODONE-ACETAMINOPHEN 5-325 MG PO TABS
1.0000 | ORAL_TABLET | Freq: Four times a day (QID) | ORAL | Status: DC | PRN
  Administered 2014-04-09: 1 via ORAL
  Filled 2014-04-09: qty 1

## 2014-04-09 NOTE — Progress Notes (Signed)
EEG completed, results pending. 

## 2014-04-09 NOTE — Progress Notes (Signed)
UR completed 

## 2014-04-09 NOTE — Progress Notes (Signed)
Pt c/o chest pain to the left of midsternal. Pain was 3 out of 10. EKG obtained. BP 110/79. 4L of oxygen applied. Pain came down to 2 out of 10 after administration of oxygen. After another 5 minutes pain came down to a 1 out of 10. Dr. Sunnie Nielsenegalado notified she stated " to give pain medication and hold off on nitro and would order labs." Will continue to monitor.

## 2014-04-09 NOTE — Progress Notes (Addendum)
[facility] PROGRESS NOTE  Miguel CoeGeorge L Sako Jr. [id number] DOB: July 22, 1970 DOA: 04/08/2014 PCP: [name]  Assessment/Plan: 1. Syncope  - Patient has a cardiac event monitor, will ask cardio to review monitor.  -Patient was initially seen as a code stroke, and since his [name] was negative and he pretty much ruled out for any CVA.  -Probable differential for his syncope is vasovagal versus orthostatic versus cardiogenic.  -Patient recently had an echocardiogram as well as a carotid Doppler which were unremarkable.  -troponin negative.  -Check Orthostatic. Hold lisinopril. Start IV fluids.  -EEG ordered by neuro.  Addendum:  Received call from cardiology, monitor didn't show any arrythmia. Patient was in sinus rhythm.   2. recent CVA  Continue with research trial medication.    3. hypertension.  BP soft. Hold lisinopril.    4. diabetes mellitus with neuropathy.  Continue with glipizide.  Hold metformin while inpatient.    Code Status: Full Code.  Family Communication:  Disposition Plan: Work up in process.    Consultants:  Neurology  Procedures:  EEG;     Antibiotics:  none  HPI/Subjective: Feeling well, no complaints.    Objective: Filed Vitals:   04/09/14 0600  BP: 99/64  Pulse: 70  Temp: 97.9 F (36.6 C)  Resp: 18    Intake/Output Summary (Last 24 hours) at 04/09/14 1004 Last data filed at 04/08/14 2328  Gross per 24 hour  Intake    240 ml  Output   1350 ml  Net  -1110 ml   Filed Weights   04/08/14 1552 04/08/14 2315 04/09/14 0600  Weight: 81.647 kg (180 lb) 86.501 kg (190 lb 11.2 oz) 86.501 kg (190 lb 11.2 oz)    Exam:   General:  Alert in no distress.   Cardiovascular: S 1, S 2 RRR  Respiratory: CTA  Abdomen: BS present, NT, ND  Musculoskeletal: no edema.   Neuro, blind, non focal.   Data Reviewed: Basic Metabolic Panel:  Recent Labs Lab 04/08/14 1620 04/08/14 1640 04/09/14 0447  NA 140 138 141  K 5.0  4.8 3.5*  CL 103 103 105  CO2 23  --  25  GLUCOSE 245* 246* 118*  BUN 7 6 8   CREATININE 0.59 0.60 0.75  CALCIUM 9.1  --  8.6   Liver Function Tests:  Recent Labs Lab 04/08/14 1620 04/09/14 0447  AST 28 19  ALT 31 28  ALKPHOS 127* 110  BILITOT 0.2* 0.4  PROT 7.3 6.4  ALBUMIN 3.8 3.5   No results found for this basename: LIPASE, AMYLASE,  in the last 168 hours No results found for this basename: AMMONIA,  in the last 168 hours CBC:  Recent Labs Lab 04/08/14 1620 04/08/14 1640 04/09/14 0447  WBC 7.7  --  7.7  NEUTROABS 5.2  --   --   HGB 14.8 15.3 13.9  HCT 40.6 45.0 39.4  MCV 88.1  --  89.3  PLT 141*  --  140*   Cardiac Enzymes:  Recent Labs Lab 04/08/14 1620  TROPONINI <0.30   BNP (last 3 results) No results found for this basename: PROBNP,  in the last 8760 hours CBG:  Recent Labs Lab 04/08/14 2311 04/09/14 0750  GLUCAP 91 139*    No results found for this or any previous visit (from the past 240 hour(s)).   Studies: Ct Head Wo Contrast  04/08/2014   CLINICAL DATA:  Code stroke. Patient found down. Right-sided weakness.  EXAM: CT HEAD WITHOUT  CONTRAST  CT NECK WITHOUT CONTRAST  TECHNIQUE: Contiguous axial images were obtained from the base of the skull through the vertex without contrast. Multidetector CT imaging of the neck was performed using the standard protocol without intravenous contrast.  COMPARISON:  Brain CT 02/11/2014  FINDINGS: CT HEAD FINDINGS  Ventricles and sulci are appropriate for patient's age. Periventricular and subcortical white matter hypodensity compatible with chronic small vessel ischemic change. No evidence for acute cortically based infarct, intracranial hemorrhage, mass effect or mass lesion. Orbits are unremarkable. Mucosal thickening involving the ethmoid air cells and bilateral maxillary sinuses. Polypoid mucosal thickening within the sphenoid sinus. Mastoid air cells are unremarkable.  CT NECK FINDINGS  Relative straightening  of the normal cervical lordosis. Multilevel degenerative disc disease. No evidence for acute cervical spine fracture. Prevertebral soft tissues are unremarkable. Craniocervical junction is intact. Visualized thyroid is unremarkable. Lung apices are unremarkable.  IMPRESSION: 1. No acute intracranial process. 2. No acute cervical spine fracture. Critical Value/emergent results were called by telephone at the time of interpretation on 04/08/2014 at 4:56 pm to Dr. Hosie Poisson, who verbally acknowledged these results.   Electronically Signed   By: Annia Belt M.D.   On: 04/08/2014 16:56   Ct Cervical Spine Wo Contrast  04/08/2014   CLINICAL DATA:  Code stroke. Patient found down. Right-sided weakness.  EXAM: CT HEAD WITHOUT CONTRAST  CT NECK WITHOUT CONTRAST  TECHNIQUE: Contiguous axial images were obtained from the base of the skull through the vertex without contrast. Multidetector CT imaging of the neck was performed using the standard protocol without intravenous contrast.  COMPARISON:  Brain CT 02/11/2014  FINDINGS: CT HEAD FINDINGS  Ventricles and sulci are appropriate for patient's age. Periventricular and subcortical white matter hypodensity compatible with chronic small vessel ischemic change. No evidence for acute cortically based infarct, intracranial hemorrhage, mass effect or mass lesion. Orbits are unremarkable. Mucosal thickening involving the ethmoid air cells and bilateral maxillary sinuses. Polypoid mucosal thickening within the sphenoid sinus. Mastoid air cells are unremarkable.  CT NECK FINDINGS  Relative straightening of the normal cervical lordosis. Multilevel degenerative disc disease. No evidence for acute cervical spine fracture. Prevertebral soft tissues are unremarkable. Craniocervical junction is intact. Visualized thyroid is unremarkable. Lung apices are unremarkable.  IMPRESSION: 1. No acute intracranial process. 2. No acute cervical spine fracture. Critical Value/emergent results were called  by telephone at the time of interpretation on 04/08/2014 at 4:56 pm to Dr. Hosie Poisson, who verbally acknowledged these results.   Electronically Signed   By: Annia Belt M.D.   On: 04/08/2014 16:56   Mr Brain Wo Contrast  04/08/2014   CLINICAL DATA:  Stroke.  EXAM: [name] HEAD WITHOUT CONTRAST  TECHNIQUE: Multiplanar, multiecho pulse sequences of the brain and surrounding structures were obtained without intravenous contrast.  COMPARISON:  CT 04/08/2014  FINDINGS: Negative for acute infarct.  Mild atrophy with mild prominence the ventricles, stable from prior studies. Multiple subcortical white matter hyperintensities are stable and consistent with chronic microvascular ischemia.  Brainstem and basal ganglia normal. Negative for hemorrhage or mass. No fluid collection.  Incidental note is made of scalp lipomas in the parietal region bilaterally. There is mucosal edema in the paranasal sinuses bilaterally.  IMPRESSION: Chronic microvascular ischemic change in the white matter stable.  Negative for acute infarct.   Electronically Signed   By: Marlan Palau M.D.   On: 04/08/2014 19:53    Scheduled Meds: . atorvastatin  40 mg Oral QPM  . FLUoxetine  20 mg Oral  Daily  . glipiZIDE  10 mg Oral Q breakfast  . insulin aspart  0-5 Units Subcutaneous QHS  . insulin aspart  0-9 Units Subcutaneous TID WC  . lisinopril  2.5 mg Oral Daily  . metFORMIN  1,000 mg Oral BID WC  . [START ON 04/10/2014] research study medication  100 mg Oral Daily  . research study medication  90 mg Oral BID  . pregabalin  150 mg Oral BID   Continuous Infusions:   Principal Problem:   Syncope Active Problems:   CVA (cerebral infarction)   Essential hypertension, benign   DM type 2 with diabetic peripheral neuropathy    Time spent: 35 minutes.     Hartley Barefoot A  [facility] Pager [phone]. If 7PM-7AM, please contact night-coverage at [email or web address], password [email or web address] 04/09/2014, 10:04 AM  LOS: 1 day

## 2014-04-09 NOTE — Procedures (Addendum)
History: 43 year old male with a history of previous stroke who presents syncope  Sedation: None  Technique: This is a 17 channel routine scalp EEG performed at the bedside with bipolar and monopolar montages arranged in accordance to the international 10/20 system of electrode placement. One channel was dedicated to EKG recording.    Background: There is a well defined posterior dominant rhythm of 10 Hz that attenuates with eye opening. The background consists of intermixed alpha and beta activities, there is excessive beta activity, likely medication effect.  Photic stimulation: Physiologic driving is present  EEG Abnormalities: None  Clinical Interpretation: This normal EEG is recorded in the waking and drowsy state. There was no seizure or seizure predisposition recorded on this study.   Ritta SlotMcNeill Jeison Delpilar, MD [facility] [phone]  If 7pm- 7am, please page neurology on call as listed in AMION.

## 2014-04-09 NOTE — Progress Notes (Signed)
Subjective: patient feels much better today, no further syncopal episodes.   Objective: Current vital signs: BP 99/64  Pulse 70  Temp(Src) 97.9 F (36.6 C) (Oral)  Resp 18  Ht 5\' 8"  (1.727 m)  Wt 86.501 kg (190 lb 11.2 oz)  BMI 29.00 kg/m2  SpO2 96% Vital signs in last 24 hours: Temp:  [97.8 F (36.6 C)-98.3 F (36.8 C)] 97.9 F (36.6 C) (10/06 0600) Pulse Rate:  [70-99] 70 (10/06 0600) Resp:  [5-22] 18 (10/06 0600) BP: (97-138)/(64-110) 99/64 mmHg (10/06 0600) SpO2:  [95 %-100 %] 96 % (10/06 0600) Weight:  [81.647 kg (180 lb)-86.501 kg (190 lb 11.2 oz)] 86.501 kg (190 lb 11.2 oz) (10/06 0600)  Intake/Output from previous day: 10/05 0701 - 10/06 0700 In: 240 [P.O.:240] Out: 1350 [Urine:1350] Intake/Output this shift:   Nutritional status:    Neurologic Exam: General: NAD Mental Status: Alert, oriented, thought content appropriate.  Speech fluent without evidence of aphasia.  Able to follow 3 step commands without difficulty. Cranial Nerves: II:  Legally blind, pupils equal, round, reactive to light and accommodation III,IV, VI: ptosis not present, extra-ocular motions intact bilaterally V,VII: smile asymmetric on the right, facial light touch sensation normal bilaterally VIII: hearing normal bilaterally IX,X: gag reflex present XI: bilateral shoulder shrug XII: midline tongue extension without atrophy or fasciculations  Motor: Right : Upper extremity   4/5    Left:     Upper extremity   5/5  Lower extremity   4/5     Lower extremity   5/5 Tone and bulk:normal tone throughout; no atrophy noted Sensory: Pinprick and light touch intact throughout, bilaterally Deep Tendon Reflexes:  Right: Upper Extremity   Left: Upper extremity   biceps (C-5 to C-6) 2/4   biceps (C-5 to C-6) 2/4 tricep (C7) 2/4    triceps (C7) 2/4 Brachioradialis (C6) 2/4  Brachioradialis (C6) 2/4  Lower Extremity Lower Extremity  quadriceps (L-2 to L-4) 2/4   quadriceps (L-2 to L-4)  2/4 Achilles (S1) 1/4   Achilles (S1) 1/4  Plantars: Right: downgoing   Left: downgoing    Lab Results: Basic Metabolic Panel:  Recent Labs Lab 04/08/14 1620 04/08/14 1640 04/09/14 0447  NA 140 138 141  K 5.0 4.8 3.5*  CL 103 103 105  CO2 23  --  25  GLUCOSE 245* 246* 118*  BUN 7 6 8   CREATININE 0.59 0.60 0.75  CALCIUM 9.1  --  8.6    Liver Function Tests:  Recent Labs Lab 04/08/14 1620 04/09/14 0447  AST 28 19  ALT 31 28  ALKPHOS 127* 110  BILITOT 0.2* 0.4  PROT 7.3 6.4  ALBUMIN 3.8 3.5   No results found for this basename: LIPASE, AMYLASE,  in the last 168 hours No results found for this basename: AMMONIA,  in the last 168 hours  CBC:  Recent Labs Lab 04/08/14 1620 04/08/14 1640 04/09/14 0447  WBC 7.7  --  7.7  NEUTROABS 5.2  --   --   HGB 14.8 15.3 13.9  HCT 40.6 45.0 39.4  MCV 88.1  --  89.3  PLT 141*  --  140*    Cardiac Enzymes:  Recent Labs Lab 04/08/14 1620  TROPONINI <0.30    Lipid Panel: No results found for this basename: CHOL, TRIG, HDL, CHOLHDL, VLDL, LDLCALC,  in the last 168 hours  CBG:  Recent Labs Lab 04/08/14 2311 04/09/14 0750  GLUCAP 91 139*    Microbiology: No results found for this or  any previous visit.  Coagulation Studies:  Recent Labs  04/08/14 1620 04/09/14 0447  LABPROT 13.3 14.2  INR 1.01 1.10    Imaging: Ct Head Wo Contrast  04/08/2014   CLINICAL DATA:  Code stroke. Patient found down. Right-sided weakness.  EXAM: CT HEAD WITHOUT CONTRAST  CT NECK WITHOUT CONTRAST  TECHNIQUE: Contiguous axial images were obtained from the base of the skull through the vertex without contrast. Multidetector CT imaging of the neck was performed using the standard protocol without intravenous contrast.  COMPARISON:  Brain CT 02/11/2014  FINDINGS: CT HEAD FINDINGS  Ventricles and sulci are appropriate for patient's age. Periventricular and subcortical white matter hypodensity compatible with chronic small vessel  ischemic change. No evidence for acute cortically based infarct, intracranial hemorrhage, mass effect or mass lesion. Orbits are unremarkable. Mucosal thickening involving the ethmoid air cells and bilateral maxillary sinuses. Polypoid mucosal thickening within the sphenoid sinus. Mastoid air cells are unremarkable.  CT NECK FINDINGS  Relative straightening of the normal cervical lordosis. Multilevel degenerative disc disease. No evidence for acute cervical spine fracture. Prevertebral soft tissues are unremarkable. Craniocervical junction is intact. Visualized thyroid is unremarkable. Lung apices are unremarkable.  IMPRESSION: 1. No acute intracranial process. 2. No acute cervical spine fracture. Critical Value/emergent results were called by telephone at the time of interpretation on 04/08/2014 at 4:56 pm to Dr. Hosie PoissonSumner, who verbally acknowledged these results.   Electronically Signed   By: Annia Beltrew  Davis M.D.   On: 04/08/2014 16:56   Ct Cervical Spine Wo Contrast  04/08/2014   CLINICAL DATA:  Code stroke. Patient found down. Right-sided weakness.  EXAM: CT HEAD WITHOUT CONTRAST  CT NECK WITHOUT CONTRAST  TECHNIQUE: Contiguous axial images were obtained from the base of the skull through the vertex without contrast. Multidetector CT imaging of the neck was performed using the standard protocol without intravenous contrast.  COMPARISON:  Brain CT 02/11/2014  FINDINGS: CT HEAD FINDINGS  Ventricles and sulci are appropriate for patient's age. Periventricular and subcortical white matter hypodensity compatible with chronic small vessel ischemic change. No evidence for acute cortically based infarct, intracranial hemorrhage, mass effect or mass lesion. Orbits are unremarkable. Mucosal thickening involving the ethmoid air cells and bilateral maxillary sinuses. Polypoid mucosal thickening within the sphenoid sinus. Mastoid air cells are unremarkable.  CT NECK FINDINGS  Relative straightening of the normal cervical  lordosis. Multilevel degenerative disc disease. No evidence for acute cervical spine fracture. Prevertebral soft tissues are unremarkable. Craniocervical junction is intact. Visualized thyroid is unremarkable. Lung apices are unremarkable.  IMPRESSION: 1. No acute intracranial process. 2. No acute cervical spine fracture. Critical Value/emergent results were called by telephone at the time of interpretation on 04/08/2014 at 4:56 pm to Dr. Hosie PoissonSumner, who verbally acknowledged these results.   Electronically Signed   By: Annia Beltrew  Davis M.D.   On: 04/08/2014 16:56   Mr Brain Wo Contrast  04/08/2014   CLINICAL DATA:  Stroke.  EXAM: [name] HEAD WITHOUT CONTRAST  TECHNIQUE: Multiplanar, multiecho pulse sequences of the brain and surrounding structures were obtained without intravenous contrast.  COMPARISON:  CT 04/08/2014  FINDINGS: Negative for acute infarct.  Mild atrophy with mild prominence the ventricles, stable from prior studies. Multiple subcortical white matter hyperintensities are stable and consistent with chronic microvascular ischemia.  Brainstem and basal ganglia normal. Negative for hemorrhage or mass. No fluid collection.  Incidental note is made of scalp lipomas in the parietal region bilaterally. There is mucosal edema in the paranasal sinuses bilaterally.  IMPRESSION:  Chronic microvascular ischemic change in the white matter stable.  Negative for acute infarct.   Electronically Signed   By: Marlan Palau M.D.   On: 04/08/2014 19:53    Medications:  Scheduled: . atorvastatin  40 mg Oral QPM  . FLUoxetine  20 mg Oral Daily  . glipiZIDE  10 mg Oral Q breakfast  . insulin aspart  0-5 Units Subcutaneous QHS  . insulin aspart  0-9 Units Subcutaneous TID WC  . lisinopril  2.5 mg Oral Daily  . metFORMIN  1,000 mg Oral BID WC  . [START ON 04/10/2014] research study medication  100 mg Oral Daily  . research study medication  90 mg Oral BID  . pregabalin  150 mg Oral BID    Assessment/Plan: 34 male  presenting to ED with syncopal episode. [name] brain is negative for acute stroke.  Given he has had two episodes of syncope with similar presentation, patient would benefit from EEG to rule out possible seizure focus.  If EEG is negative no further work up recommended from a neurology standpoint.  Discussed with Dr. Georgeann Oppenheim PA-C [facility] [phone]  04/09/2014, 9:59 AM

## 2014-04-10 DIAGNOSIS — I6330 Cerebral infarction due to thrombosis of unspecified cerebral artery: Secondary | ICD-10-CM

## 2014-04-10 LAB — BASIC METABOLIC PANEL
Anion gap: 13 (ref 5–15)
BUN: 9 mg/dL (ref 6–23)
CALCIUM: 8.1 mg/dL — AB (ref 8.4–10.5)
CO2: 21 mEq/L (ref 19–32)
CREATININE: 0.69 mg/dL (ref 0.50–1.35)
Chloride: 107 mEq/L (ref 96–112)
GFR calc Af Amer: >90 mL/min (ref >90)
GFR calc non Af Amer: >90 mL/min (ref >90)
GLUCOSE: 145 mg/dL — AB (ref 70–99)
Potassium: 3.8 mEq/L (ref 3.7–5.3)
Sodium: 141 mEq/L (ref 137–147)

## 2014-04-10 LAB — GLUCOSE, CAPILLARY
GLUCOSE-CAPILLARY: 163 mg/dL — AB (ref 70–99)
GLUCOSE-CAPILLARY: 225 mg/dL — AB (ref 70–99)
Glucose-Capillary: 127 mg/dL — ABNORMAL HIGH (ref 70–99)
Glucose-Capillary: 168 mg/dL — ABNORMAL HIGH (ref 70–99)

## 2014-04-10 LAB — TROPONIN I: Troponin I: <0.3 ng/mL (ref <0.30)

## 2014-04-10 MED ORDER — INFLUENZA VAC SPLIT QUAD 0.5 ML IM SUSY
0.5000 mL | PREFILLED_SYRINGE | INTRAMUSCULAR | Status: AC
  Administered 2014-04-11: 0.5 mL via INTRAMUSCULAR
  Filled 2014-04-10: qty 0.5

## 2014-04-10 NOTE — ED Provider Notes (Signed)
Medical screening examination/treatment/procedure(s) were performed by non-physician practitioner and as supervising physician I was immediately available for consultation/collaboration.   EKG Interpretation   Date/Time:  Monday April 08 2014 15:50:11 EDT Ventricular Rate:  90 PR Interval:  89 QRS Duration: 80 QT Interval:  350 QTC Calculation: 428 R Axis:   123 Text Interpretation:  Sinus rhythm Short PR interval Abnormal R-wave  progression, early transition Inferior infarct, age indeterminate T wave  abnormality Anterior leads When compared with ECG of 02/11/2014 T wave  abnormality Anterior leads is now Present Confirmed by [facility]  MD,  Nicholos JohnsKATHLEEN [phone]([id number]) on 04/08/2014 5:42:21 PM        Miguel JesterKathleen Alan Drummer, DO 04/10/14 1621

## 2014-04-10 NOTE — Progress Notes (Addendum)
[facility] PROGRESS NOTE  Miguel Diaz. ZOX:[id number] DOB: October 10, 1970 DOA: 04/08/2014 PCP: [name]  Assessment/Plan: 1. Syncope  -Event monitor reviewed per Cards-no arrhythmias noted -[name] was negative, EEG negative -Probable differential for his syncope is vasovagal versus orthostatic  -Patient recently had an echocardiogram as well as a carotid Doppler which were unremarkable.  -troponin negative, no events on Tele -cut down IVF, monitor BP, Ambulate today  2. Recent CVA  Continue with research trial medication.   3. hypertension.  BP soft but stable, Hold lisinopril.   4. diabetes mellitus with neuropathy.  Continue with glipizide.  Hold metformin while inpatient, SSI  Code Status: Full Code.  Family Communication: d/w girlfriend at bedside Disposition Plan: home tomorrow if stable  Consultants:  Neurology  Procedures:  EEG;     Antibiotics:  none  HPI/Subjective: Feeling well, no complaints   Objective: Filed Vitals:   04/10/14 1415  BP: 116/72  Pulse: 69  Temp: 97.9 F (36.6 C)  Resp: 18    Intake/Output Summary (Last 24 hours) at 04/10/14 1735 Last data filed at 04/10/14 1500  Gross per 24 hour  Intake    440 ml  Output    301 ml  Net    139 ml   Filed Weights   04/08/14 2315 04/09/14 0600 04/10/14 0600  Weight: 86.501 kg (190 lb 11.2 oz) 86.501 kg (190 lb 11.2 oz) 86.6 kg (190 lb 14.7 oz)    Exam:   General:  Alert in no distress.   Cardiovascular: S 1, S 2 RRR  Respiratory: CTA  Abdomen: BS present, NT, ND  Musculoskeletal: no edema.   Neuro, blind, non focal.   Data Reviewed: Basic Metabolic Panel:  Recent Labs Lab 04/08/14 1620 04/08/14 1640 04/09/14 0447 04/10/14 0436  NA 140 138 141 141  K 5.0 4.8 3.5* 3.8  CL 103 103 105 107  CO2 23  --  25 21  GLUCOSE 245* 246* 118* 145*  BUN 7 6 8 9   CREATININE 0.59 0.60 0.75 0.69  CALCIUM 9.1  --  8.6 8.1*   Liver Function Tests:  Recent  Labs Lab 04/08/14 1620 04/09/14 0447  AST 28 19  ALT 31 28  ALKPHOS 127* 110  BILITOT 0.2* 0.4  PROT 7.3 6.4  ALBUMIN 3.8 3.5   No results found for this basename: LIPASE, AMYLASE,  in the last 168 hours No results found for this basename: AMMONIA,  in the last 168 hours CBC:  Recent Labs Lab 04/08/14 1620 04/08/14 1640 04/09/14 0447  WBC 7.7  --  7.7  NEUTROABS 5.2  --   --   HGB 14.8 15.3 13.9  HCT 40.6 45.0 39.4  MCV 88.1  --  89.3  PLT 141*  --  140*   Cardiac Enzymes:  Recent Labs Lab 04/08/14 1620 04/09/14 1840 04/09/14 2245 04/10/14 0436  TROPONINI <0.30 <0.30 <0.30 <0.30   BNP (last 3 results) No results found for this basename: PROBNP,  in the last 8760 hours CBG:  Recent Labs Lab 04/09/14 1720 04/09/14 2141 04/10/14 0755 04/10/14 1143 04/10/14 1706  GLUCAP 138* 163* 127* 168* 163*    No results found for this or any previous visit (from the past 240 hour(s)).   Studies: Mr Brain Wo Contrast  04/08/2014   CLINICAL DATA:  Stroke.  EXAM: [name] HEAD WITHOUT CONTRAST  TECHNIQUE: Multiplanar, multiecho pulse sequences of the brain and surrounding structures were obtained without intravenous contrast.  COMPARISON:  CT  04/08/2014  FINDINGS: Negative for acute infarct.  Mild atrophy with mild prominence the ventricles, stable from prior studies. Multiple subcortical white matter hyperintensities are stable and consistent with chronic microvascular ischemia.  Brainstem and basal ganglia normal. Negative for hemorrhage or mass. No fluid collection.  Incidental note is made of scalp lipomas in the parietal region bilaterally. There is mucosal edema in the paranasal sinuses bilaterally.  IMPRESSION: Chronic microvascular ischemic change in the white matter stable.  Negative for acute infarct.   Electronically Signed   By: Marlan Palau M.D.   On: 04/08/2014 19:53    Scheduled Meds: . atorvastatin  40 mg Oral QPM  . FLUoxetine  20 mg Oral Daily  . glipiZIDE  10  mg Oral Q breakfast  . [START ON 04/11/2014] Influenza vac split quadrivalent PF  0.5 mL Intramuscular Tomorrow-1000  . insulin aspart  0-5 Units Subcutaneous QHS  . insulin aspart  0-9 Units Subcutaneous TID WC  . research study medication  100 mg Oral Daily  . research study medication  90 mg Oral BID  . pregabalin  150 mg Oral BID   Continuous Infusions:   Principal Problem:   Syncope Active Problems:   CVA (cerebral infarction)   Essential hypertension, benign   DM type 2 with diabetic peripheral neuropathy    Time spent: 35 minutes.     [facility]  [facility] Pager [phone]. If 7PM-7AM, please contact night-coverage at [email or web address], password [email or web address] 04/10/2014, 5:35 PM  LOS: 2 days

## 2014-04-10 NOTE — Evaluation (Signed)
Physical Therapy Evaluation and Discharge  Patient Details Name: Miguel Diaz. MRN: [id number] DOB: 08-Jan-1971 Today's Date: 04/10/2014   History of Present Illness  43 y.o. male with Past medical history of recent CVA of midbrain, diabetes mellitus, diabetic neuropathy, bilateral blindness, anxiety. Adm due to fall/syncopal episode.   Clinical Impression  Patient evaluated by Physical Therapy and is at baseline for mobility. Pt  with no further acute PT needs identified. All education has been completed and the patient has no further questions. Education regarding signs and symptoms of stroke and orthostatic complete. See below for any follow-up Physial Therapy or equipment needs. PT is signing off. Thank you for this referral.     Follow Up Recommendations No PT follow up;Supervision/Assistance - 24 hour    Equipment Recommendations  None recommended by PT    Recommendations for Other Services       Precautions / Restrictions Precautions Precautions: None Restrictions Weight Bearing Restrictions: No      Mobility  Bed Mobility Overal bed mobility: Independent                Transfers Overall transfer level: Modified independent Equipment used: None             General transfer comment: min cues due to new enviroment   Ambulation/Gait Ambulation/Gait assistance: Min guard;Supervision Ambulation Distance (Feet): 200 Feet Assistive device:  (guided with min tactile cueing and verbal cues ) Gait Pattern/deviations: Decreased stride length;Step-through pattern Gait velocity: decreased vs baseline due to new enviroment  Gait velocity interpretation: Below normal speed for age/gender General Gait Details: use of tactile cues and verbal cues to guide pt in hallway due to blindness; no LOB noted with any change in direction or cueing  Stairs Stairs: Yes Stairs assistance: Min guard Stair Management: One rail Right;Step to pattern;Forwards (handheld (A) on  Lt) Number of Stairs: 10 General stair comments: min guard to steady, which is pt baseline, and multimodal cues for direction and guidance   Wheelchair Mobility    Modified Rankin (Stroke Patients Only)       Balance Overall balance assessment: Needs assistance Sitting-balance support: Feet supported;No upper extremity supported Sitting balance-Leahy Scale: Good     Standing balance support: During functional activity;Single extremity supported Standing balance-Leahy Scale: Poor Standing balance comment: UE support required when in new enviroment due to blindness             High level balance activites: Direction changes High Level Balance Comments: pt requires tactile and verbal cueing for directional changes due to visual deficits              Pertinent Vitals/Pain Pain Assessment: No/denies pain    Home Living Family/patient expects to be discharged to:: Private residence Living Arrangements: Spouse/significant other Available Help at Discharge: Family;Available 24 hours/day Type of Home: House Home Access: Stairs to enter Entrance Stairs-Rails: Can reach both;Left;Right Entrance Stairs-Number of Steps: 3 Home Layout: One level Home Equipment: Cane - single point;Walker - 2 wheels;Bedside commode;Shower seat ("tapping stick" )      Prior Function Level of Independence: Independent with assistive device(s)         Comments: Independent with his cane at home with all ADLs and self care.  He does not cook or do houswork.      Hand Dominance   Dominant Hand: Right    Extremity/Trunk Assessment   Upper Extremity Assessment: Overall WFL for tasks assessed  Lower Extremity Assessment: Overall WFL for tasks assessed      Cervical / Trunk Assessment: Normal  Communication   Communication: No difficulties  Cognition Arousal/Alertness: Awake/alert Behavior During Therapy: WFL for tasks assessed/performed Overall Cognitive Status:  Within Functional Limits for tasks assessed                      General Comments General comments (skin integrity, edema, etc.): reviewed "BE FAST" for stroke education with pt and wife     Exercises        Assessment/Plan    PT Assessment Patent does not need any further PT services  PT Diagnosis Difficulty walking   PT Problem List    PT Treatment Interventions     PT Goals (Current goals can be found in the Care Plan section) Acute Rehab PT Goals Patient Stated Goal: to go home PT Goal Formulation: No goals set, d/c therapy    Frequency     Barriers to discharge        Co-evaluation               End of Session Equipment Utilized During Treatment: Gait belt Activity Tolerance: Patient tolerated treatment well Patient left: in bed;with call bell/phone within reach;with family/visitor present Nurse Communication: Mobility status    Functional Assessment Tool Used: clinical judgement Functional Limitation: Mobility: Walking and moving around Mobility: Walking and Moving Around Current Status ([id number]): At least 1 percent but less than 20 percent impaired, limited or restricted Mobility: Walking and Moving Around Goal Status [phone]([id number]): At least 1 percent but less than 20 percent impaired, limited or restricted Mobility: Walking and Moving Around Discharge Status [phone]): At least 1 percent but less than 20 percent impaired, limited or restricted    Time: 1123-1133 PT Time Calculation (min): 10 min   Charges:   PT Evaluation $Initial PT Evaluation Tier I: 1 Procedure     PT G Codes:   Functional Assessment Tool Used: clinical judgement Functional Limitation: Mobility: Walking and moving around    [place], GoltryBrittany N, [place]PT  [phone] 04/10/2014, 11:47 AM

## 2014-04-11 LAB — GLUCOSE, CAPILLARY
GLUCOSE-CAPILLARY: 216 mg/dL — AB (ref 70–99)
Glucose-Capillary: 154 mg/dL — ABNORMAL HIGH (ref 70–99)

## 2014-04-11 NOTE — Progress Notes (Signed)
Patient d/c to home, all belongings returned, all home meds inventoried and returned to patient. VSS. Patient verbalized understanding of discharge instructions.

## 2014-04-11 NOTE — Discharge Summary (Signed)
Physician Discharge Summary  Miguel Diaz. ZOX:[id number] DOB: June 09, 1971 DOA: 04/08/2014  PCP: [name]  Admit date: 04/08/2014 Discharge date: 04/11/2014  Time spent: 40 minutes  Recommendations for Outpatient Follow-up:  1. PCP Lorin Picket PA at [facility] in 1 week 2. Lisinopril stopped due to soft BPs  Discharge Diagnoses:  Principal Problem:   Syncope Active Problems:   CVA (cerebral infarction)   Essential hypertension, benign   DM type 2 with diabetic peripheral neuropathy   Hypotension  Discharge Condition: stable  Diet recommendation: diabetic  Filed Weights   04/09/14 0600 04/10/14 0600 04/11/14 0606  Weight: 86.501 kg (190 lb 11.2 oz) 86.6 kg (190 lb 14.7 oz) 86.637 kg (191 lb)    History of present illness:  Miguel Diaz. is a 43 y.o. male with Past medical history of recent CVA of midbrain, diabetes mellitus, diabetic neuropathy, bilateral blindness, anxiety.  The patient presented with complaints of a fall. He mentions that at around 2 PM on 10/5 he was trying to let his dog in the house and the next thing that he remembers was he was surrounded by EMS.  As per his wife at around 2:30 PM he called his wife to get some help as he had fallen down although patient does not remember that. When the wife reached home within a few minutes she found that he was lying down on the floor unresponsive to verbal command. There was no incontinence or seizure-like activity is noted. There was no tongue bite noted. Patient become more alert with arrival of the EMS  Hospital Course:  1. Syncope  -suspect related to hypotension-med mediated or vasovagal -Event monitor reviewed per Cards-no arrhythmias noted  -[name] was negative, EEG negative  -Patient recently had an echocardiogram as well as a carotid Doppler which were unremarkable.  -troponin x3 negative, no events on Tele  -hydrated with NS, Lisinopril stopped Baseline BP around 100 -ambulated with PT in halls  without symptoms  2. Recent CVA  Continue with research trial medication, ASA vs Brillinta  3. hypertension.  BP soft but stable, stopped lisinopril.   4. diabetes mellitus with neuropathy.  Continue with glipizide and metformin      Discharge Exam: Filed Vitals:   04/11/14 0606  BP: 100/65  Pulse: 62  Temp: 97.7 F (36.5 C)  Resp: 18    General: AAOx3 Cardiovascular: S1S2/RRR Respiratory: CTAB  Discharge Instructions You were cared for by a hospitalist during your hospital stay. If you have any questions about your discharge medications or the care you received while you were in the hospital after you are discharged, you can call the unit and asked to speak with the hospitalist on call if the hospitalist that took care of you is not available. Once you are discharged, your primary care physician will handle any further medical issues. Please note that NO REFILLS for any discharge medications will be authorized once you are discharged, as it is imperative that you return to your primary care physician (or establish a relationship with a primary care physician if you do not have one) for your aftercare needs so that they can reassess your need for medications and monitor your lab values.  Discharge Instructions   Diet Carb Modified    Complete by:  As directed      Increase activity slowly    Complete by:  As directed           Current Discharge Medication List  CONTINUE these medications which have NOT CHANGED   Details  atorvastatin (LIPITOR) 40 MG tablet Take 40 mg by mouth every evening.     FLUoxetine (PROZAC) 20 MG capsule Take 20 mg by mouth daily.    glipiZIDE (GLUCOTROL XL) 10 MG 24 hr tablet Take 10 mg by mouth daily with breakfast.    ibuprofen (ADVIL,MOTRIN) 600 MG tablet Take 600 mg by mouth every 6 (six) hours as needed.    metFORMIN (GLUMETZA) 500 MG (MOD) 24 hr tablet Take 1,000 mg by mouth 2 (two) times daily.    pregabalin (LYRICA) 150 MG  capsule Take 150 mg by mouth 2 (two) times daily.    !! research study medication Take 90 mg by mouth 2 (two) times daily.    !! research study medication Take 180 mg by mouth daily.     Misc. Devices (HEART RATE MONITOR) MISC 1 Units by Does not apply route as directed. Qty: 1 each, Refills: 0     !! - Potential duplicate medications found. Please discuss with provider.    STOP taking these medications     lisinopril (PRINIVIL,ZESTRIL) 2.5 MG tablet        Allergies  Allergen Reactions  . Penicillins Other (See Comments)    Childhood allergy  . Penicillins     unknown  . Toradol [Ketorolac Tromethamine] Hives  . Toradol [Ketorolac Tromethamine]     Hives    Follow-up Information   [facility] with PCP[facility]. Schedule an appointment as soon as possible for a visit in 1 week.       The results of significant diagnostics from this hospitalization (including imaging, microbiology, ancillary and laboratory) are listed below for reference.    Significant Diagnostic Studies: Ct Head Wo Contrast  04/08/2014   CLINICAL DATA:  Code stroke. Patient found down. Right-sided weakness.  EXAM: CT HEAD WITHOUT CONTRAST  CT NECK WITHOUT CONTRAST  TECHNIQUE: Contiguous axial images were obtained from the base of the skull through the vertex without contrast. Multidetector CT imaging of the neck was performed using the standard protocol without intravenous contrast.  COMPARISON:  Brain CT 02/11/2014  FINDINGS: CT HEAD FINDINGS  Ventricles and sulci are appropriate for patient's age. Periventricular and subcortical white matter hypodensity compatible with chronic small vessel ischemic change. No evidence for acute cortically based infarct, intracranial hemorrhage, mass effect or mass lesion. Orbits are unremarkable. Mucosal thickening involving the ethmoid air cells and bilateral maxillary sinuses. Polypoid mucosal thickening within the sphenoid sinus. Mastoid air cells are  unremarkable.  CT NECK FINDINGS  Relative straightening of the normal cervical lordosis. Multilevel degenerative disc disease. No evidence for acute cervical spine fracture. Prevertebral soft tissues are unremarkable. Craniocervical junction is intact. Visualized thyroid is unremarkable. Lung apices are unremarkable.  IMPRESSION: 1. No acute intracranial process. 2. No acute cervical spine fracture. Critical Value/emergent results were called by telephone at the time of interpretation on 04/08/2014 at 4:56 pm to Dr. Hosie PoissonSumner, who verbally acknowledged these results.   Electronically Signed   By: Annia Beltrew  Davis M.D.   On: 04/08/2014 16:56   Ct Cervical Spine Wo Contrast  04/08/2014   CLINICAL DATA:  Code stroke. Patient found down. Right-sided weakness.  EXAM: CT HEAD WITHOUT CONTRAST  CT NECK WITHOUT CONTRAST  TECHNIQUE: Contiguous axial images were obtained from the base of the skull through the vertex without contrast. Multidetector CT imaging of the neck was performed using the standard protocol without intravenous contrast.  COMPARISON:  Brain CT 02/11/2014  FINDINGS: CT HEAD FINDINGS  Ventricles and sulci are appropriate for patient's age. Periventricular and subcortical white matter hypodensity compatible with chronic small vessel ischemic change. No evidence for acute cortically based infarct, intracranial hemorrhage, mass effect or mass lesion. Orbits are unremarkable. Mucosal thickening involving the ethmoid air cells and bilateral maxillary sinuses. Polypoid mucosal thickening within the sphenoid sinus. Mastoid air cells are unremarkable.  CT NECK FINDINGS  Relative straightening of the normal cervical lordosis. Multilevel degenerative disc disease. No evidence for acute cervical spine fracture. Prevertebral soft tissues are unremarkable. Craniocervical junction is intact. Visualized thyroid is unremarkable. Lung apices are unremarkable.  IMPRESSION: 1. No acute intracranial process. 2. No acute cervical  spine fracture. Critical Value/emergent results were called by telephone at the time of interpretation on 04/08/2014 at 4:56 pm to Dr. Hosie Poisson, who verbally acknowledged these results.   Electronically Signed   By: Annia Belt M.D.   On: 04/08/2014 16:56   Mr Brain Wo Contrast  04/08/2014   CLINICAL DATA:  Stroke.  EXAM: [name] HEAD WITHOUT CONTRAST  TECHNIQUE: Multiplanar, multiecho pulse sequences of the brain and surrounding structures were obtained without intravenous contrast.  COMPARISON:  CT 04/08/2014  FINDINGS: Negative for acute infarct.  Mild atrophy with mild prominence the ventricles, stable from prior studies. Multiple subcortical white matter hyperintensities are stable and consistent with chronic microvascular ischemia.  Brainstem and basal ganglia normal. Negative for hemorrhage or mass. No fluid collection.  Incidental note is made of scalp lipomas in the parietal region bilaterally. There is mucosal edema in the paranasal sinuses bilaterally.  IMPRESSION: Chronic microvascular ischemic change in the white matter stable.  Negative for acute infarct.   Electronically Signed   By: Marlan Palau M.D.   On: 04/08/2014 19:53    Microbiology: No results found for this or any previous visit (from the past 240 hour(s)).   Labs: Basic Metabolic Panel:  Recent Labs Lab 04/08/14 1620 04/08/14 1640 04/09/14 0447 04/10/14 0436  NA 140 138 141 141  K 5.0 4.8 3.5* 3.8  CL 103 103 105 107  CO2 23  --  25 21  GLUCOSE 245* 246* 118* 145*  BUN 7 6 8 9   CREATININE 0.59 0.60 0.75 0.69  CALCIUM 9.1  --  8.6 8.1*   Liver Function Tests:  Recent Labs Lab 04/08/14 1620 04/09/14 0447  AST 28 19  ALT 31 28  ALKPHOS 127* 110  BILITOT 0.2* 0.4  PROT 7.3 6.4  ALBUMIN 3.8 3.5   No results found for this basename: LIPASE, AMYLASE,  in the last 168 hours No results found for this basename: AMMONIA,  in the last 168 hours CBC:  Recent Labs Lab 04/08/14 1620 04/08/14 1640 04/09/14 0447   WBC 7.7  --  7.7  NEUTROABS 5.2  --   --   HGB 14.8 15.3 13.9  HCT 40.6 45.0 39.4  MCV 88.1  --  89.3  PLT 141*  --  140*   Cardiac Enzymes:  Recent Labs Lab 04/08/14 1620 04/09/14 1840 04/09/14 2245 04/10/14 0436  TROPONINI <0.30 <0.30 <0.30 <0.30   BNP: BNP (last 3 results) No results found for this basename: PROBNP,  in the last 8760 hours CBG:  Recent Labs Lab 04/10/14 0755 04/10/14 1143 04/10/14 1706 04/10/14 2010 04/11/14 0724  GLUCAP 127* 168* 163* 225* 154*       Signed:  Zenda Herskowitz  [facility] 04/11/2014, 11:16 AM

## 2014-04-11 NOTE — Discharge Instructions (Signed)
Syncope °Syncope is a medical term for fainting or passing out. This means you lose consciousness and drop to the ground. People are generally unconscious for less than 5 minutes. You may have some muscle twitches for up to 15 seconds before waking up and returning to normal. Syncope occurs more often in older adults, but it can happen to anyone. While most causes of syncope are not dangerous, syncope can be a sign of a serious medical problem. It is important to seek medical care.  °CAUSES  °Syncope is caused by a sudden drop in blood flow to the brain. The specific cause is often not determined. Factors that can bring on syncope include: °· Taking medicines that lower blood pressure. °· Sudden changes in posture, such as standing up quickly. °· Taking more medicine than prescribed. °· Standing in one place for too long. °· Seizure disorders. °· Dehydration and excessive exposure to heat. °· Low blood sugar (hypoglycemia). °· Straining to have a bowel movement. °· Heart disease, irregular heartbeat, or other circulatory problems. °· Fear, emotional distress, seeing blood, or severe pain. °SYMPTOMS  °Right before fainting, you may: °· Feel dizzy or light-headed. °· Feel nauseous. °· See all white or all black in your field of vision. °· Have cold, clammy skin. °DIAGNOSIS  °Your health care provider will ask about your symptoms, perform a physical exam, and perform an electrocardiogram (ECG) to record the electrical activity of your heart. Your health care provider may also perform other heart or blood tests to determine the cause of your syncope which may include: °· Transthoracic echocardiogram (TTE). During echocardiography, sound waves are used to evaluate how blood flows through your heart. °· Transesophageal echocardiogram (TEE). °· Cardiac monitoring. This allows your health care provider to monitor your heart rate and rhythm in real time. °· Holter monitor. This is a portable device that records your  heartbeat and can help diagnose heart arrhythmias. It allows your health care provider to track your heart activity for several days, if needed. °· Stress tests by exercise or by giving medicine that makes the heart beat faster. °TREATMENT  °In most cases, no treatment is needed. Depending on the cause of your syncope, your health care provider may recommend changing or stopping some of your medicines. °HOME CARE INSTRUCTIONS °· Have someone stay with you until you feel stable. °· Do not drive, use machinery, or play sports until your health care provider says it is okay. °· Keep all follow-up appointments as directed by your health care provider. °· Lie down right away if you start feeling like you might faint. Breathe deeply and steadily. Wait until all the symptoms have passed. °· Drink enough fluids to keep your urine clear or pale yellow. °· If you are taking blood pressure or heart medicine, get up slowly and take several minutes to sit and then stand. This can reduce dizziness. °SEEK IMMEDIATE MEDICAL CARE IF:  °· You have a severe headache. °· You have unusual pain in the chest, abdomen, or back. °· You are bleeding from your mouth or rectum, or you have black or tarry stool. °· You have an irregular or very fast heartbeat. °· You have pain with breathing. °· You have repeated fainting or seizure-like jerking during an episode. °· You faint when sitting or lying down. °· You have confusion. °· You have trouble walking. °· You have severe weakness. °· You have vision problems. °If you fainted, call your local emergency services (911 in [place] Do not drive   yourself to the hospital.  °MAKE SURE YOU: °· Understand these instructions. °· Will watch your condition. °· Will get help right away if you are not doing well or get worse. °Document Released: 06/21/2005 Document Revised: 06/26/2013 Document Reviewed: 08/20/2011 °ExitCare® Patient Information ©2015 [employer]. This information is not intended to replace  advice given to you by your health care provider. Make sure you discuss any questions you have with your health care provider. ° °

## 2014-04-18 ENCOUNTER — Encounter: Payer: Self-pay | Admitting: *Deleted

## 2014-04-18 NOTE — Progress Notes (Signed)
Patient ID: Miguel CoeGeorge L Wardell Jr., male   DOB: 04/15/1971, 43 y.o.   MRN: [id number] [employer] 30 day cardiac event monitor dates of service 03/06/2014-04/12/2014.

## 2014-04-19 ENCOUNTER — Telehealth: Payer: Self-pay | Admitting: *Deleted

## 2014-04-19 NOTE — Telephone Encounter (Signed)
Advised patient   Event monitor reviewed by  Dr. Patty SermonsBrackbill  Interpretation: normal event monitor

## 2014-05-15 ENCOUNTER — Encounter (INDEPENDENT_AMBULATORY_CARE_PROVIDER_SITE_OTHER): Payer: Self-pay | Admitting: Neurology

## 2014-05-15 ENCOUNTER — Encounter: Payer: Self-pay | Admitting: Cardiology

## 2014-05-15 DIAGNOSIS — Z0289 Encounter for other administrative examinations: Secondary | ICD-10-CM

## 2014-05-19 ENCOUNTER — Encounter (HOSPITAL_COMMUNITY): Payer: Self-pay | Admitting: Emergency Medicine

## 2014-05-19 ENCOUNTER — Emergency Department (HOSPITAL_COMMUNITY): Payer: Medicaid Other

## 2014-05-19 ENCOUNTER — Inpatient Hospital Stay (HOSPITAL_COMMUNITY)
Admission: EM | Admit: 2014-05-19 | Discharge: 2014-05-21 | DRG: 057 | Disposition: A | Discharge status: Home / Self Care | Payer: Medicaid Other | Attending: Internal Medicine | Admitting: Internal Medicine

## 2014-05-19 DIAGNOSIS — F17210 Nicotine dependence, cigarettes, uncomplicated: Secondary | ICD-10-CM | POA: Diagnosis present

## 2014-05-19 DIAGNOSIS — E785 Hyperlipidemia, unspecified: Secondary | ICD-10-CM | POA: Diagnosis present

## 2014-05-19 DIAGNOSIS — F329 Major depressive disorder, single episode, unspecified: Secondary | ICD-10-CM | POA: Diagnosis present

## 2014-05-19 DIAGNOSIS — R531 Weakness: Secondary | ICD-10-CM | POA: Diagnosis present

## 2014-05-19 DIAGNOSIS — F419 Anxiety disorder, unspecified: Secondary | ICD-10-CM | POA: Diagnosis present

## 2014-05-19 DIAGNOSIS — I10 Essential (primary) hypertension: Secondary | ICD-10-CM | POA: Diagnosis present

## 2014-05-19 DIAGNOSIS — H540 Blindness, both eyes: Secondary | ICD-10-CM | POA: Diagnosis present

## 2014-05-19 DIAGNOSIS — G629 Polyneuropathy, unspecified: Secondary | ICD-10-CM

## 2014-05-19 DIAGNOSIS — Z720 Tobacco use: Secondary | ICD-10-CM

## 2014-05-19 DIAGNOSIS — D696 Thrombocytopenia, unspecified: Secondary | ICD-10-CM | POA: Diagnosis present

## 2014-05-19 DIAGNOSIS — Z7982 Long term (current) use of aspirin: Secondary | ICD-10-CM | POA: Diagnosis not present

## 2014-05-19 DIAGNOSIS — E1142 Type 2 diabetes mellitus with diabetic polyneuropathy: Secondary | ICD-10-CM

## 2014-05-19 DIAGNOSIS — I69351 Hemiplegia and hemiparesis following cerebral infarction affecting right dominant side: Principal | ICD-10-CM

## 2014-05-19 DIAGNOSIS — I6330 Cerebral infarction due to thrombosis of unspecified cerebral artery: Secondary | ICD-10-CM | POA: Insufficient documentation

## 2014-05-19 DIAGNOSIS — I639 Cerebral infarction, unspecified: Secondary | ICD-10-CM | POA: Insufficient documentation

## 2014-05-19 DIAGNOSIS — M6289 Other specified disorders of muscle: Secondary | ICD-10-CM

## 2014-05-19 DIAGNOSIS — I609 Nontraumatic subarachnoid hemorrhage, unspecified: Secondary | ICD-10-CM

## 2014-05-19 LAB — CBC
HCT: 40 % (ref 39.0–52.0)
Hemoglobin: 14.1 g/dL (ref 13.0–17.0)
MCH: 32.3 pg (ref 26.0–34.0)
MCHC: 35.3 g/dL (ref 30.0–36.0)
MCV: 91.7 fL (ref 78.0–100.0)
Platelets: 116 10*3/uL — ABNORMAL LOW (ref 150–400)
RBC: 4.36 MIL/uL (ref 4.22–5.81)
RDW: 12.6 % (ref 11.5–15.5)
WBC: 7.5 10*3/uL (ref 4.0–10.5)

## 2014-05-19 LAB — COMPREHENSIVE METABOLIC PANEL
ALT: 25 U/L (ref 0–53)
ANION GAP: 13 (ref 5–15)
AST: 18 U/L (ref 0–37)
Albumin: 3.8 g/dL (ref 3.5–5.2)
Alkaline Phosphatase: 104 U/L (ref 39–117)
BUN: 9 mg/dL (ref 6–23)
CALCIUM: 9.2 mg/dL (ref 8.4–10.5)
CO2: 24 meq/L (ref 19–32)
CREATININE: 0.63 mg/dL (ref 0.50–1.35)
Chloride: 102 mEq/L (ref 96–112)
GFR calc Af Amer: >90 mL/min (ref >90)
Glucose, Bld: 239 mg/dL — ABNORMAL HIGH (ref 70–99)
Potassium: 3.7 mEq/L (ref 3.7–5.3)
Sodium: 139 mEq/L (ref 137–147)
Total Protein: 6.7 g/dL (ref 6.0–8.3)

## 2014-05-19 LAB — DIFFERENTIAL
BASOS ABS: 0 10*3/uL (ref 0.0–0.1)
BASOS PCT: 0 % (ref 0–1)
Eosinophils Absolute: 0.3 10*3/uL (ref 0.0–0.7)
Eosinophils Relative: 4 % (ref 0–5)
LYMPHS PCT: 33 % (ref 12–46)
Lymphs Abs: 2.5 10*3/uL (ref 0.7–4.0)
MONO ABS: 0.4 10*3/uL (ref 0.1–1.0)
MONOS PCT: 6 % (ref 3–12)
Neutro Abs: 4.3 10*3/uL (ref 1.7–7.7)
Neutrophils Relative %: 57 % (ref 43–77)

## 2014-05-19 LAB — CBG MONITORING, ED: Glucose-Capillary: 228 mg/dL — ABNORMAL HIGH (ref 70–99)

## 2014-05-19 LAB — I-STAT CHEM 8, ED
BUN: 8 mg/dL (ref 6–23)
Calcium, Ion: 1.17 mmol/L (ref 1.12–1.23)
Chloride: 102 mEq/L (ref 96–112)
Creatinine, Ser: 0.6 mg/dL (ref 0.50–1.35)
GLUCOSE: 239 mg/dL — AB (ref 70–99)
HEMATOCRIT: 41 % (ref 39.0–52.0)
HEMOGLOBIN: 13.9 g/dL (ref 13.0–17.0)
Potassium: 3.5 mEq/L — ABNORMAL LOW (ref 3.7–5.3)
Sodium: 142 mEq/L (ref 137–147)
TCO2: 23 mmol/L (ref 0–100)

## 2014-05-19 LAB — APTT: aPTT: 32 seconds (ref 24–37)

## 2014-05-19 LAB — I-STAT TROPONIN, ED: TROPONIN I, POC: 0.01 ng/mL (ref 0.00–0.08)

## 2014-05-19 LAB — PROTIME-INR
INR: 1.04 (ref 0.00–1.49)
Prothrombin Time: 13.7 seconds (ref 11.6–15.2)

## 2014-05-19 MED ORDER — ASPIRIN 300 MG RE SUPP
300.0000 mg | Freq: Every day | RECTAL | Status: DC
  Administered 2014-05-20 (×2): 300 mg via RECTAL
  Filled 2014-05-19: qty 1

## 2014-05-19 MED ORDER — ATORVASTATIN CALCIUM 40 MG PO TABS
40.0000 mg | ORAL_TABLET | Freq: Every evening | ORAL | Status: DC
  Administered 2014-05-20: 40 mg via ORAL
  Filled 2014-05-19: qty 1

## 2014-05-19 MED ORDER — GLIPIZIDE ER 10 MG PO TB24
10.0000 mg | ORAL_TABLET | Freq: Every day | ORAL | Status: DC
Start: 2014-05-20 — End: 2014-05-20
  Filled 2014-05-19 (×2): qty 1

## 2014-05-19 MED ORDER — ASPIRIN 325 MG PO TABS
325.0000 mg | ORAL_TABLET | Freq: Every day | ORAL | Status: DC
  Administered 2014-05-21: 325 mg via ORAL
  Filled 2014-05-19: qty 1

## 2014-05-19 MED ORDER — SENNOSIDES-DOCUSATE SODIUM 8.6-50 MG PO TABS: 1.0000 | ORAL_TABLET | Freq: Every evening | ORAL | Status: DC | PRN

## 2014-05-19 MED ORDER — STUDY - INVESTIGATIONAL DRUG SIMPLE RECORD: 180.0000 mg | Freq: Every day | Status: DC

## 2014-05-19 MED ORDER — STROKE: EARLY STAGES OF RECOVERY BOOK
Freq: Once | Status: AC
  Administered 2014-05-20: 01:00:00
  Filled 2014-05-19: qty 1

## 2014-05-19 MED ORDER — STUDY - INVESTIGATIONAL DRUG SIMPLE RECORD: 90.0000 mg | Freq: Two times a day (BID) | Status: DC

## 2014-05-19 MED ORDER — METFORMIN HCL ER 500 MG PO TB24
1000.0000 mg | ORAL_TABLET | Freq: Two times a day (BID) | ORAL | Status: DC
  Administered 2014-05-20 – 2014-05-21 (×2): 1000 mg via ORAL
  Filled 2014-05-19 (×2): qty 2

## 2014-05-19 MED ORDER — INSULIN ASPART 100 UNIT/ML ~~LOC~~ SOLN: 0.0000 [IU] | Freq: Every day | SUBCUTANEOUS | Status: DC

## 2014-05-19 MED ORDER — FLUOXETINE HCL 20 MG PO CAPS
20.0000 mg | ORAL_CAPSULE | Freq: Every day | ORAL | Status: DC
  Administered 2014-05-20 – 2014-05-21 (×2): 20 mg via ORAL
  Filled 2014-05-19 (×2): qty 1

## 2014-05-19 MED ORDER — PREGABALIN 75 MG PO CAPS
150.0000 mg | ORAL_CAPSULE | Freq: Two times a day (BID) | ORAL | Status: DC
  Administered 2014-05-20 – 2014-05-21 (×3): 150 mg via ORAL
  Filled 2014-05-19 (×3): qty 2

## 2014-05-19 MED ORDER — INSULIN ASPART 100 UNIT/ML ~~LOC~~ SOLN: 0.0000 [IU] | Freq: Three times a day (TID) | SUBCUTANEOUS | Status: DC

## 2014-05-19 NOTE — ED Provider Notes (Signed)
CSN: [id number]     Arrival date & time 05/19/14  2106 History   First MD Initiated Contact with Patient 05/19/14 2108     Chief Complaint  Patient presents with  . Code Stroke    An emergency department physician performed an initial assessment on this suspected stroke patient at 2106.  The history is provided by the patient and medical records.  patient presents complaining of new right-sided weakness.  Last known well 8:25 PM.he was in a laundromat when his girlfriend notes that he was unable to move his right arm or right leg resulting ina fall.  He was complaining of headache at the time as well.  Patient reports ongoing headache at this time.  Still reports weakness of the right side of his body.  He has a history of diabetes, hyperlipidemia, brainstem stroke in August 2015 with residual right-sided weakness from the stroke.  Patient reports she is more weak on his right side at this time.  Brought to the emergency department as a code stroke. NIH stroke scale 6     Past Medical History  Diagnosis Date  . Diabetes mellitus without complication   . Neuropathy   . Asthma   . Vision loss of left eye   . Blindness     bilateral  . Hypercholesteremia   . Depression   . Anxiety   . Stroke 02/2014    right sided weakness/numbness   Past Surgical History  Procedure Laterality Date  . Hernia repair    . Eye surgery Left   . Abdominal surgery     Family History  Problem Relation Age of Onset  . High blood pressure Mother   . High blood pressure Sister   . High blood pressure Paternal Grandfather    History  Substance Use Topics  . Smoking status: Current Every Day Smoker -- 1.00 packs/day    Types: Cigarettes  . Smokeless tobacco: Never Used  . Alcohol Use: No     Comment: Ocassional drinker    Review of Systems  All other systems reviewed and are negative.     Allergies  Penicillins; Penicillins; Toradol; and Toradol  Home Medications   Prior to Admission  medications   Medication Sig Start Date End Date Taking? Authorizing Provider  atorvastatin (LIPITOR) 40 MG tablet Take 40 mg by mouth every evening.     Historical Provider, MD  FLUoxetine (PROZAC) 20 MG capsule Take 20 mg by mouth daily.    Historical Provider, MD  glipiZIDE (GLUCOTROL XL) 10 MG 24 hr tablet Take 10 mg by mouth daily with breakfast.    Historical Provider, MD  ibuprofen (ADVIL,MOTRIN) 600 MG tablet Take 600 mg by mouth every 6 (six) hours as needed.    Historical Provider, MD  metFORMIN (GLUMETZA) 500 MG (MOD) 24 hr tablet Take 1,000 mg by mouth 2 (two) times daily.    Historical Provider, MD  Misc. Devices (HEART RATE MONITOR) MISC 1 Units by Does not apply route as directed. 01/27/14   Brittainy Sherlynn CarbonM Simmons, PA-C  pregabalin (LYRICA) 150 MG capsule Take 150 mg by mouth 2 (two) times daily.    Historical Provider, MD  research study medication Take 90 mg by mouth 2 (two) times daily. 02/13/14   Maryann Mikhail, DO  research study medication Take 180 mg by mouth daily.  02/13/14   Maryann Mikhail, DO   BP 140/90 mmHg  Pulse 89  Temp(Src) 97.5 F (36.4 C) (Oral)  Resp 23  Ht 5'  8" (1.727 m)  Wt 240 lb (108.863 kg)  BMI 36.50 kg/m2  SpO2 100% Physical Exam  Constitutional: He is oriented to person, place, and time. He appears well-developed and well-nourished.  HENT:  Head: Normocephalic and atraumatic.  Eyes: EOM are normal.  Neck: Normal range of motion.  Cardiovascular: Normal rate, regular rhythm, normal heart sounds and intact distal pulses.   Pulmonary/Chest: Effort normal and breath sounds normal. No respiratory distress.  Abdominal: Soft. He exhibits no distension. There is no tenderness.  Musculoskeletal: Normal range of motion.  Neurological: He is alert and oriented to person, place, and time.  Mild right upper and lower extremityany paresis.  No facial droop.  No dysarthria.  Skin: Skin is warm and dry.  Psychiatric: He has a normal mood and affect. Judgment  normal.  Nursing note and vitals reviewed.   ED Course  Procedures (including critical care time)  CRITICAL CARE Performed by: Lyanne CoAMPOS,Eeshan Verbrugge M Total critical care time: 35 Critical care time was exclusive of separately billable procedures and treating other patients. Critical care was necessary to treat or prevent imminent or life-threatening deterioration. Critical care was time spent personally by me on the following activities: development of treatment plan with patient and/or surrogate as well as nursing, discussions with consultants, evaluation of patient's response to treatment, examination of patient, obtaining history from patient or surrogate, ordering and performing treatments and interventions, ordering and review of laboratory studies, ordering and review of radiographic studies, pulse oximetry and re-evaluation of patient's condition.   Labs Review Labs Reviewed  CBC - Abnormal; Notable for the following:    Platelets 116 (*)    All other components within normal limits  COMPREHENSIVE METABOLIC PANEL - Abnormal; Notable for the following:    Glucose, Bld 239 (*)    Total Bilirubin <0.2 (*)    All other components within normal limits  CBG MONITORING, ED - Abnormal; Notable for the following:    Glucose-Capillary 228 (*)    All other components within normal limits  I-STAT CHEM 8, ED - Abnormal; Notable for the following:    Potassium 3.5 (*)    Glucose, Bld 239 (*)    All other components within normal limits  PROTIME-INR  APTT  DIFFERENTIAL  I-STAT TROPOININ, ED    Imaging Review Ct Head (brain) Wo Contrast  05/19/2014   CLINICAL DATA:  Code stroke. Patient with right-sided deficits. History of previous stroke and right-sided blindness.  EXAM: CT HEAD WITHOUT CONTRAST  TECHNIQUE: Contiguous axial images were obtained from the base of the skull through the vertex without intravenous contrast.  COMPARISON:  [name] brain 04/08/2014.  CT head 04/08/2014.  FINDINGS:  Ventricles and sulci appear symmetrical. Patchy low-attenuation changes in the deep white matter consistent with known small vessel ischemic change. No mass effect or midline shift. No abnormal extra-axial fluid collections. Gray-white matter junctions are distinct. Basal cisterns are not effaced. No evidence of acute intracranial hemorrhage. No depressed skull fractures. Partial opacification of ethmoid air cells, maxillary antra, and sphenoid sinuses bilaterally with mucosal thickening in the frontal sinuses. No acute air-fluid levels. Opacification of right mastoid air cells.  IMPRESSION: No acute intracranial abnormalities. Chronic small vessel ischemic changes. Chronic appearing inflammatory changes in the paranasal sinuses. Fluid in the right mastoid air cells.  These results were called by telephone at the time of interpretation on 05/19/2014 at 9:26 pm to Dr. Azalia BilisKEVIN Mikie Misner , who verbally acknowledged these results.   Electronically Signed   By: Burman NievesWilliam  Stevens  M.D.   On: 05/19/2014 21:28  I personally reviewed the imaging tests through PACS system I reviewed available ER/hospitalization records through the EMR    EKG Interpretation   Date/Time:  Sunday May 19 2014 21:21:56 EST Ventricular Rate:  87 PR Interval:  121 QRS Duration: 82 QT Interval:  352 QTC Calculation: 423 R Axis:   32 Text Interpretation:  Sinus rhythm RSR' in V1 or V2, right VCD or RVH  Borderline T abnormalities, inferior leads No significant change was found  Confirmed by Andreyah Natividad  MD, Caryn Bee ([id number]) on 05/19/2014 9:51:48 PM      MDM   Final diagnoses:  None   Patient is brought to the emergency department as a code stroke.  Initial [name] is negative.  He is not a candidate for TPA given his history of stroke within the last 3 months.  NIH stroke scale is 6.  Patient will be admitted the hospital for ongoing workup of stroke.  Please see neurology consultation for complete details.  EKG normal sinus rhythm.   Patient reports compliance with his medications.  Still having headache at this time.  Some of this could represent complicated migraine   Lyanne Co, MD 05/19/14 2156

## 2014-05-19 NOTE — ED Notes (Signed)
[facility] weakness pt has had cvva affecting rt side in past pt was also c/o headache unkn when that started and chest pain so medics gave asa 324 mg po and ntg x2 sl pta

## 2014-05-19 NOTE — ED Notes (Signed)
Per dr. Heath Larkcampos mri may be done in am not tonight xray tech called [employer] and writer gave that info

## 2014-05-19 NOTE — Consult Note (Addendum)
Referring Physician: ED    Chief Complaint: CODE STROKE, RIGHT HEMIPARESIS, HA  HPI:                                                                                                                                         Miguel Kiss. is an 43 y.o. 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 He was at some point involved in the SOCRATES trial but he said that he is participating in that study anymore. Patient has residual right sided weakness resulting from his stroke last August, and around 2025 tonight he was doing the laundry when his girlfriend noticed that he was not able to move the right side. In addition, he was complaining of HA. EMS was called and described him as unable to move the right side but there is not associated vertigo, slurred speech, or language impairment. He is blind in the left eye and has very poor vision in the right eye. NIHSS 6. CT brain showed no acute intracranial abnormality. Date last known well:  Time last known well:  tPA Given: no, stroke within the past 3 months and many of his deficits are likely old. NIHSS: 6   Past Medical History  Diagnosis Date  . Diabetes mellitus without complication   . Neuropathy   . Asthma   . Vision loss of left eye   . Blindness     bilateral  . Hypercholesteremia   . Depression   . Anxiety   . Stroke 02/2014    right sided weakness/numbness    Past Surgical History  Procedure Laterality Date  . Hernia repair    . Eye surgery Left   . Abdominal surgery      Family History  Problem Relation Age of Onset  . High blood pressure Mother   . High blood pressure Sister   . High blood pressure Paternal Grandfather    Social History:  reports that he has been smoking Cigarettes.  He has been smoking about 1.00  pack per day. He has never used smokeless tobacco. He reports that he does not drink alcohol or use illicit drugs.  Allergies:  Allergies  Allergen Reactions  . Penicillins Other (See Comments)    Childhood allergy  . Penicillins     unknown  . Toradol [Ketorolac Tromethamine] Hives  . Toradol [Ketorolac Tromethamine]     Hives     Medications:  I have reviewed the patient's current medications.  ROS:                                                                                                                                       History obtained from the patient and chart review.  General ROS: negative for - chills, fatigue, fever, night sweats, or weight loss Psychological ROS: negative for - behavioral disorder, hallucinations, memory difficulties, or suicidal ideation Ophthalmic ROS: negative for - blurry vision, double vision, eye pain or loss of vision ENT ROS: negative for - epistaxis, nasal discharge, oral lesions, sore throat, tinnitus or vertigo Allergy and Immunology ROS: negative for - hives or itchy/watery eyes Hematological and Lymphatic ROS: negative for - bleeding problems, bruising or swollen lymph nodes Endocrine ROS: negative for - galactorrhea, hair pattern changes, polydipsia/polyuria or temperature intolerance Respiratory ROS: negative for - cough, hemoptysis, shortness of breath or wheezing Cardiovascular ROS: negative for - chest pain, dyspnea on exertion, edema or irregular heartbeat Gastrointestinal ROS: negative for - abdominal pain, diarrhea, hematemesis, nausea/vomiting or stool incontinence Genito-Urinary ROS: negative for - dysuria, hematuria, incontinence or urinary frequency/urgency Musculoskeletal ROS: negative for - joint swelling  Neurological ROS: as noted in HPI Dermatological ROS: negative for rash and skin lesion  changes  Physical exam: pleasant male in no apparent distress. Blood pressure 142/96, pulse 92, temperature 97.5 F (36.4 C), temperature source Oral, resp. rate 23, height 5\' 8"  (1.727 m), weight 108.863 kg (240 lb), SpO2 100 %. Head: normocephalic. Neck: supple, no bruits, no JVD. Cardiac: no murmurs. Lungs: clear. Abdomen: soft, no tender, no mass. Extremities: no edema. Neurologic Examination:                                                                                                      General: Mental Status: Alert, oriented, thought content appropriate.  Speech fluent without evidence of aphasia.  Able to follow 3 step commands without difficulty. Cranial Nerves: II: Discs flat bilaterally; blind left eye and very poor visual acuity right eye, pupils equal, round, reactive to light III,IV, VI: ptosis not present, extra-ocular motions intact bilaterally V,VII: smile symmetric, facial light touch sensation normal bilaterally VIII: hearing normal bilaterally IX,X: gag reflex present XI: bilateral shoulder shrug XII: midline tongue extension without atrophy or fasciculations Motor: Significant for mild right hemiparesis. Tone and bulk:normal tone throughout; no atrophy noted Sensory: Pinprick and light touch intact diminished in the right side. Deep Tendon Reflexes:  Right: Upper Extremity  Left: Upper extremity   biceps (C-5 to C-6) 2/4   biceps (C-5 to C-6) 2/4 tricep (C7) 2/4    triceps (C7) 2/4 Brachioradialis (C6) 2/4  Brachioradialis (C6) 2/4  Lower Extremity Lower Extremity  quadriceps (L-2 to L-4) 2/4   quadriceps (L-2 to L-4) 2/4 Achilles (S1) 2/4   Achilles (S1) 2/4 Plantars: Right: downgoing   Left: downgoing Cerebellar: normal finger-to-nose,  normal heel-to-shin test Gait:  No tested    Results for orders placed or performed during the hospital encounter of 05/19/14 (from the past 48 hour(s))  CBC     Status: None (Preliminary result)   Collection  Time: 05/19/14  9:09 PM  Result Value Ref Range   WBC 7.5 4.0 - 10.5 K/uL   RBC 4.36 4.22 - 5.81 MIL/uL   Hemoglobin 14.1 13.0 - 17.0 g/dL   HCT 16.1 09.6 - 04.5 %   MCV 91.7 78.0 - 100.0 fL   MCH 32.3 26.0 - 34.0 pg   MCHC 35.3 30.0 - 36.0 g/dL   RDW 40.9 81.1 - 91.4 %   Platelets PENDING 150 - 400 K/uL  Differential     Status: None   Collection Time: 05/19/14  9:09 PM  Result Value Ref Range   Neutrophils Relative % 57 43 - 77 %   Neutro Abs 4.3 1.7 - 7.7 K/uL   Lymphocytes Relative 33 12 - 46 %   Lymphs Abs 2.5 0.7 - 4.0 K/uL   Monocytes Relative 6 3 - 12 %   Monocytes Absolute 0.4 0.1 - 1.0 K/uL   Eosinophils Relative 4 0 - 5 %   Eosinophils Absolute 0.3 0.0 - 0.7 K/uL   Basophils Relative 0 0 - 1 %   Basophils Absolute 0.0 0.0 - 0.1 K/uL  I-stat troponin, ED (not at [facility])     Status: None   Collection Time: 05/19/14  9:17 PM  Result Value Ref Range   Troponin i, poc 0.01 0.00 - 0.08 ng/mL   Comment 3            Comment: Due to the release kinetics of cTnI, a negative result within the first hours of the onset of symptoms does not rule out myocardial infarction with certainty. If myocardial infarction is still suspected, repeat the test at appropriate intervals.   I-Stat Chem 8, ED     Status: Abnormal   Collection Time: 05/19/14  9:19 PM  Result Value Ref Range   Sodium 142 137 - 147 mEq/L   Potassium 3.5 (L) 3.7 - 5.3 mEq/L   Chloride 102 96 - 112 mEq/L   BUN 8 6 - 23 mg/dL   Creatinine, Ser 7.82 0.50 - 1.35 mg/dL   Glucose, Bld 956 (H) 70 - 99 mg/dL   Calcium, Ion 2.13 0.86 - 1.23 mmol/L   TCO2 23 0 - 100 mmol/L   Hemoglobin 13.9 13.0 - 17.0 g/dL   HCT 57.8 46.9 - 62.9 %  CBG monitoring, ED     Status: Abnormal   Collection Time: 05/19/14  9:32 PM  Result Value Ref Range   Glucose-Capillary 228 (H) 70 - 99 mg/dL   Ct Head (brain) Wo Contrast  05/19/2014   CLINICAL DATA:  Code stroke. Patient with right-sided deficits. History of previous stroke and  right-sided blindness.  EXAM: CT HEAD WITHOUT CONTRAST  TECHNIQUE: Contiguous axial images were obtained from the base of the skull through the vertex without intravenous contrast.  COMPARISON:  [name] brain 04/08/2014.  CT head  04/08/2014.  FINDINGS: Ventricles and sulci appear symmetrical. Patchy low-attenuation changes in the deep white matter consistent with known small vessel ischemic change. No mass effect or midline shift. No abnormal extra-axial fluid collections. Gray-white matter junctions are distinct. Basal cisterns are not effaced. No evidence of acute intracranial hemorrhage. No depressed skull fractures. Partial opacification of ethmoid air cells, maxillary antra, and sphenoid sinuses bilaterally with mucosal thickening in the frontal sinuses. No acute air-fluid levels. Opacification of right mastoid air cells.  IMPRESSION: No acute intracranial abnormalities. Chronic small vessel ischemic changes. Chronic appearing inflammatory changes in the paranasal sinuses. Fluid in the right mastoid air cells.  These results were called by telephone at the time of interpretation on 05/19/2014 at 9:26 pm to Dr. Azalia BilisKEVIN CAMPOS , who verbally acknowledged these results.   Electronically Signed   By: Burman NievesWilliam  Stevens M.D.   On: 05/19/2014 21:28    Assessment: 43 y.o. male brought in with as a code stroke due to worsening right hemiparesis and HA. NIHSS 6 but no full effort on testing and concern that many of his deficits could be old. Patient had stroke 3 months ago. After taken into consideration these factors, I did not treat patient with IV thrombolysis. [name] brain, if positive for stroke admit to the hospital but no need to repeat stroke work up as this was done just 3 months ago. Can be discharge home if [name] negative for stroke. Resume aspirin (as per patient he is not involved in the SOCRATES trial anymore). Stroke team to follow up in the morning in case patient gets admitted to the hospital.  Stroke Risk  Factors - DM, hyperlipidemia, prior stroke.    Miguel Portelasvaldo Leovardo Thoman, MD [facility] [phone]  05/19/2014, 9:37 PM

## 2014-05-19 NOTE — ED Notes (Signed)
Admitting md at bedside

## 2014-05-19 NOTE — ED Notes (Signed)
Transporting patient to new room assignment. 

## 2014-05-19 NOTE — ED Notes (Signed)
Pt had ntg x2 sl and aspirin 324 po pta by medics

## 2014-05-19 NOTE — H&P (Signed)
Miguel Thetford. is an 43 y.o. male.    Pcp:  Dr. Sheppard Coil [facility], [place]) Chief Complaint: right sided weakness HPI: 43 yo male with dm2, CVA with residual right sided weakness c/o increased right sided weakness starting this evening. Pt is not aware exactly what time it started this evening.  Pt came in for evaluation.  Per ED, seen by neuro and not a candidate for TPA.  CT brain negative.  Pt still has c/o right sided weakness.  + numbness, tingling on the right side.  Denies dysphagia, dysarthria.  Pt will be admitted for r/o CVA.    Past Medical History  Diagnosis Date  . Diabetes mellitus without complication   . Neuropathy   . Asthma   . Vision loss of left eye   . Blindness     bilateral  . Hypercholesteremia   . Depression   . Anxiety   . Stroke 02/2014    right sided weakness/numbness    Past Surgical History  Procedure Laterality Date  . Hernia repair    . Eye surgery Left   . Abdominal surgery      Family History  Problem Relation Age of Onset  . High blood pressure Mother   . High blood pressure Sister   . High blood pressure Paternal Grandfather    Social History:  reports that he has been smoking Cigarettes.  He has been smoking about 1.00 pack per day. He has never used smokeless tobacco. He reports that he does not drink alcohol or use illicit drugs.  Allergies:  Allergies  Allergen Reactions  . Penicillins Other (See Comments)    Childhood allergy  . Penicillins     unknown  . Toradol [Ketorolac Tromethamine] Hives  . Toradol [Ketorolac Tromethamine]     Hives      (Not in a hospital admission)  Results for orders placed or performed during the hospital encounter of 05/19/14 (from the past 48 hour(s))  Protime-INR     Status: None   Collection Time: 05/19/14  9:09 PM  Result Value Ref Range   Prothrombin Time 13.7 11.6 - 15.2 seconds   INR 1.04 0.00 - 1.49  APTT     Status: None   Collection Time: 05/19/14  9:09 PM  Result Value Ref  Range   aPTT 32 24 - 37 seconds  CBC     Status: Abnormal   Collection Time: 05/19/14  9:09 PM  Result Value Ref Range   WBC 7.5 4.0 - 10.5 K/uL   RBC 4.36 4.22 - 5.81 MIL/uL   Hemoglobin 14.1 13.0 - 17.0 g/dL   HCT 40.0 39.0 - 52.0 %   MCV 91.7 78.0 - 100.0 fL   MCH 32.3 26.0 - 34.0 pg   MCHC 35.3 30.0 - 36.0 g/dL   RDW 12.6 11.5 - 15.5 %   Platelets 116 (L) 150 - 400 K/uL    Comment: REPEATED TO VERIFY PLATELET COUNT CONFIRMED BY SMEAR   Differential     Status: None   Collection Time: 05/19/14  9:09 PM  Result Value Ref Range   Neutrophils Relative % 57 43 - 77 %   Neutro Abs 4.3 1.7 - 7.7 K/uL   Lymphocytes Relative 33 12 - 46 %   Lymphs Abs 2.5 0.7 - 4.0 K/uL   Monocytes Relative 6 3 - 12 %   Monocytes Absolute 0.4 0.1 - 1.0 K/uL   Eosinophils Relative 4 0 - 5 %   Eosinophils  Absolute 0.3 0.0 - 0.7 K/uL   Basophils Relative 0 0 - 1 %   Basophils Absolute 0.0 0.0 - 0.1 K/uL  Comprehensive metabolic panel     Status: Abnormal   Collection Time: 05/19/14  9:09 PM  Result Value Ref Range   Sodium 139 137 - 147 mEq/L   Potassium 3.7 3.7 - 5.3 mEq/L   Chloride 102 96 - 112 mEq/L   CO2 24 19 - 32 mEq/L   Glucose, Bld 239 (H) 70 - 99 mg/dL   BUN 9 6 - 23 mg/dL   Creatinine, Ser 0.63 0.50 - 1.35 mg/dL   Calcium 9.2 8.4 - 10.5 mg/dL   Total Protein 6.7 6.0 - 8.3 g/dL   Albumin 3.8 3.5 - 5.2 g/dL   AST 18 0 - 37 U/L   ALT 25 0 - 53 U/L   Alkaline Phosphatase 104 39 - 117 U/L   Total Bilirubin <0.2 (L) 0.3 - 1.2 mg/dL   GFR calc non Af Amer >90 >90 mL/min   GFR calc Af Amer >90 >90 mL/min    Comment: (NOTE) The eGFR has been calculated using the CKD EPI equation. This calculation has not been validated in all clinical situations. eGFR's persistently <90 mL/min signify possible Chronic Kidney Disease.    Anion gap 13 5 - 15  I-stat troponin, ED (not at [facility])     Status: None   Collection Time: 05/19/14  9:17 PM  Result Value Ref Range   Troponin i, poc 0.01 0.00 -  0.08 ng/mL   Comment 3            Comment: Due to the release kinetics of cTnI, a negative result within the first hours of the onset of symptoms does not rule out myocardial infarction with certainty. If myocardial infarction is still suspected, repeat the test at appropriate intervals.   I-Stat Chem 8, ED     Status: Abnormal   Collection Time: 05/19/14  9:19 PM  Result Value Ref Range   Sodium 142 137 - 147 mEq/L   Potassium 3.5 (L) 3.7 - 5.3 mEq/L   Chloride 102 96 - 112 mEq/L   BUN 8 6 - 23 mg/dL   Creatinine, Ser 0.60 0.50 - 1.35 mg/dL   Glucose, Bld 239 (H) 70 - 99 mg/dL   Calcium, Ion 1.17 1.12 - 1.23 mmol/L   TCO2 23 0 - 100 mmol/L   Hemoglobin 13.9 13.0 - 17.0 g/dL   HCT 41.0 39.0 - 52.0 %  CBG monitoring, ED     Status: Abnormal   Collection Time: 05/19/14  9:32 PM  Result Value Ref Range   Glucose-Capillary 228 (H) 70 - 99 mg/dL   Ct Head (brain) Wo Contrast  05/19/2014   CLINICAL DATA:  Code stroke. Patient with right-sided deficits. History of previous stroke and right-sided blindness.  EXAM: CT HEAD WITHOUT CONTRAST  TECHNIQUE: Contiguous axial images were obtained from the base of the skull through the vertex without intravenous contrast.  COMPARISON:  [name] brain 04/08/2014.  CT head 04/08/2014.  FINDINGS: Ventricles and sulci appear symmetrical. Patchy low-attenuation changes in the deep white matter consistent with known small vessel ischemic change. No mass effect or midline shift. No abnormal extra-axial fluid collections. Gray-white matter junctions are distinct. Basal cisterns are not effaced. No evidence of acute intracranial hemorrhage. No depressed skull fractures. Partial opacification of ethmoid air cells, maxillary antra, and sphenoid sinuses bilaterally with mucosal thickening in the frontal sinuses. No acute air-fluid levels.  Opacification of right mastoid air cells.  IMPRESSION: No acute intracranial abnormalities. Chronic small vessel ischemic changes.  Chronic appearing inflammatory changes in the paranasal sinuses. Fluid in the right mastoid air cells.  These results were called by telephone at the time of interpretation on 05/19/2014 at 9:26 pm to Dr. Jola Schmidt , who verbally acknowledged these results.   Electronically Signed   By: Lucienne Capers M.D.   On: 05/19/2014 21:28    Review of Systems  Constitutional: Negative for fever, chills, weight loss, malaise/fatigue and diaphoresis.  HENT: Negative for congestion, ear discharge, ear pain, hearing loss, nosebleeds, sore throat and tinnitus.   Eyes: Negative for blurred vision, double vision, photophobia, pain, discharge and redness.  Respiratory: Negative for cough, hemoptysis, sputum production, shortness of breath, wheezing and stridor.   Cardiovascular: Positive for chest pain. Negative for palpitations, orthopnea, claudication, leg swelling and PND.       Left sided reproducible with palpation, started tonight,   Gastrointestinal: Negative for heartburn, nausea, vomiting, abdominal pain, diarrhea, constipation, blood in stool and melena.  Genitourinary: Negative for dysuria, urgency, frequency, hematuria and flank pain.  Musculoskeletal: Negative for myalgias, back pain, joint pain, falls and neck pain.  Skin: Negative for itching and rash.  Neurological: Negative for dizziness, tingling, tremors, sensory change, speech change, focal weakness, seizures, loss of consciousness, weakness and headaches.  Endo/Heme/Allergies: Negative for environmental allergies and polydipsia. Does not bruise/bleed easily.  Psychiatric/Behavioral: Negative for depression, suicidal ideas, hallucinations, memory loss and substance abuse. The patient is not nervous/anxious and does not have insomnia.     Blood pressure 128/84, pulse 87, temperature 97.5 F (36.4 C), temperature source Oral, resp. rate 19, height 5' 8"  (1.727 m), weight 108.863 kg (240 lb), SpO2 98 %. Physical Exam  Constitutional: He is  oriented to person, place, and time. He appears well-developed and well-nourished.  HENT:  Head: Normocephalic and atraumatic.  Eyes: Conjunctivae and EOM are normal. Pupils are equal, round, and reactive to light.  Neck: Normal range of motion. Neck supple. No JVD present. No tracheal deviation present. No thyromegaly present.  Cardiovascular: Normal rate and regular rhythm.  Exam reveals no gallop and no friction rub.   No murmur heard. Respiratory: Effort normal and breath sounds normal. No stridor. No respiratory distress. He has no wheezes. He has no rales. He exhibits tenderness.  GI: Soft. Bowel sounds are normal. He exhibits no distension. There is no tenderness. There is no rebound and no guarding.  Musculoskeletal: Normal range of motion. He exhibits no edema or tenderness.  Lymphadenopathy:    He has no cervical adenopathy.  Neurological: He is alert and oriented to person, place, and time. He has normal reflexes. He displays normal reflexes. No cranial nerve deficit. He exhibits normal muscle tone. Coordination normal.  Motor 5-/5 prox and distal right upper and lower ext  Skin: Skin is warm and dry. No rash noted. No erythema. No pallor.  Psychiatric: He has a normal mood and affect. His behavior is normal. Judgment and thought content normal.     Assessment/Plan Right sided weakness r/o CVA Check [name] brain, carotid u/s, cardiac 2D ech Check tsh, lipid,  Appreciate neurology input Aspirin, lipitor.   CP: likely musculoskeletal Check trop i q6h x3,  Consider stress testing if cardiac markers negative.   Dm2:   fsbs ac and qhs, iss  Hyperlipidemia Cont lipitor  Thrombocytopenia Check cbc in am  Jani Gravel 05/19/2014, 10:14 PM

## 2014-05-19 NOTE — ED Notes (Signed)
Pt remains npo did not pass swallow screen could not drink from straw

## 2014-05-20 ENCOUNTER — Inpatient Hospital Stay (HOSPITAL_COMMUNITY): Payer: Medicaid Other

## 2014-05-20 DIAGNOSIS — I6330 Cerebral infarction due to thrombosis of unspecified cerebral artery: Secondary | ICD-10-CM | POA: Insufficient documentation

## 2014-05-20 DIAGNOSIS — I10 Essential (primary) hypertension: Secondary | ICD-10-CM

## 2014-05-20 DIAGNOSIS — G459 Transient cerebral ischemic attack, unspecified: Secondary | ICD-10-CM

## 2014-05-20 LAB — GLUCOSE, CAPILLARY
Glucose-Capillary: 114 mg/dL — ABNORMAL HIGH (ref 70–99)
Glucose-Capillary: 135 mg/dL — ABNORMAL HIGH (ref 70–99)
Glucose-Capillary: 152 mg/dL — ABNORMAL HIGH (ref 70–99)
Glucose-Capillary: 239 mg/dL — ABNORMAL HIGH (ref 70–99)
Glucose-Capillary: 81 mg/dL (ref 70–99)

## 2014-05-20 LAB — LIPID PANEL
CHOL/HDL RATIO: 3.2 ratio
Cholesterol: 100 mg/dL (ref 0–200)
HDL: 31 mg/dL — AB (ref >39)
LDL CALC: 55 mg/dL (ref 0–99)
Triglycerides: 70 mg/dL (ref <150)
VLDL: 14 mg/dL (ref 0–40)

## 2014-05-20 LAB — HEMOGLOBIN A1C
HEMOGLOBIN A1C: 7.5 % — AB (ref <5.7)
Mean Plasma Glucose: 169 mg/dL — ABNORMAL HIGH (ref <117)

## 2014-05-20 MED ORDER — SODIUM CHLORIDE 0.9 % IJ SOLN
3.0000 mL | Freq: Two times a day (BID) | INTRAMUSCULAR | Status: DC
  Administered 2014-05-20 – 2014-05-21 (×4): 3 mL via INTRAVENOUS

## 2014-05-20 MED ORDER — INSULIN GLARGINE 100 UNIT/ML ~~LOC~~ SOLN
10.0000 [IU] | Freq: Every day | SUBCUTANEOUS | Status: DC
Start: 2014-05-20 — End: 2014-05-21
  Administered 2014-05-20 – 2014-05-21 (×2): 10 [IU] via SUBCUTANEOUS
  Filled 2014-05-20 (×2): qty 0.1

## 2014-05-20 MED ORDER — INSULIN ASPART 100 UNIT/ML ~~LOC~~ SOLN
0.0000 [IU] | SUBCUTANEOUS | Status: DC
  Administered 2014-05-20: 3 [IU] via SUBCUTANEOUS
  Administered 2014-05-20: 5 [IU] via SUBCUTANEOUS
  Administered 2014-05-21: 2 [IU] via SUBCUTANEOUS
  Administered 2014-05-21: 3 [IU] via SUBCUTANEOUS
  Administered 2014-05-21: 2 [IU] via SUBCUTANEOUS

## 2014-05-20 NOTE — Procedures (Signed)
ELECTROENCEPHALOGRAM REPORT  Patient: Miguel CoeGeorge L Baria Jr.       Room #: 4N14 EEG No. ID: [id number] Age: 43 y.o.        Sex: male Referring Physician: Verner MouldVANN, J Report Date:  05/20/2014        Interpreting Physician: Aline BrochureSTEWART,Shoua Ulloa R  History: Miguel CoeGeorge L Barrette Jr. is an 43 y.o. male with a history of hypercholesterolemia, diabetes mellitus and brainstem stroke in August 2015 with residual right-sided weakness, presenting with acute headache and inability to move his right side.  Indications for study:  Rule out focal seizure activity.  Technique: This is an 18 channel routine scalp EEG performed at the bedside with bipolar and monopolar montages arranged in accordance to the international 10/20 system of electrode placement.   Description: This EEG recording was performed during wakefulness and during sleep. Predominant background activity during wakefulness consisted of 10 Hz symmetrical alpha rhythm which attenuated well with eye opening. Photic stimulation and hyperventilation were not performed. Symmetrical sleep spindles, vertex waves and arousal responses were recorded during stage II of sleep. No epileptiform discharges occurred during wakefulness nor during sleep. There was no abnormal slowing of cerebral activity.  Interpretation: This is a normal EEG recording during wakefulness and during sleep. No evidence of an epileptic disorder was demonstrated.   Venetia MaxonR Uma Jerde M.D. [facility] [phone]

## 2014-05-20 NOTE — Plan of Care (Signed)
Problem: Acute Treatment Outcomes Goal: Hemodynamically stable Outcome: Completed/Met Date Met:  05/20/14

## 2014-05-20 NOTE — Progress Notes (Signed)
EEG completed; results pending.    

## 2014-05-20 NOTE — Plan of Care (Signed)
Problem: Acute Treatment Outcomes Goal: 02 Sats > 94% Outcome: Completed/Met Date Met:  05/20/14

## 2014-05-20 NOTE — Progress Notes (Signed)
  Echocardiogram 2D Echocardiogram has been performed.  Miguel Diaz, Miguel Diaz 05/20/2014, 10:49 AM

## 2014-05-20 NOTE — Evaluation (Signed)
Physical Therapy Evaluation Patient Details Name: Miguel CoeGeorge L Wollard Jr. MRN: [id number] DOB: 11/05/1970 Today's Date: 05/20/2014   History of Present Illness  Patient is a 43 y/o male admitted with right sided weakness concerning for CVA. Pt was doing the laundry on evening of 11/15 when his girlfriend noticed that he was not able to move his right side. In addition, he was complaining of HA. EMS was called and described him as unable to move the right side. [name]-(-). [name] brain (-).  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     Clinical Impression  Patient presents with functional limitations due to deficits listed in PT problem list (see below). Pt with residual right sided weakness from prior CVA and visual deficits impacting mobility in an unfamiliar environment. Pt reports weakness seems to have resolved. Mildly unsteady on feet most likely due to hospitalization. Will need to assess ability to negotiate steps next session. Pt would benefit from acute PT to improve transfers, gait, balance and overall mobility so pt can maximize independence and return to PLOF.    Follow Up Recommendations Supervision/Assistance - 24 hour    Equipment Recommendations  None recommended by PT    Recommendations for Other Services OT consult     Precautions / Restrictions Precautions Precautions: Fall Precaution Comments: blind in left eye and minimal vision right eye. Restrictions Weight Bearing Restrictions: No      Mobility  Bed Mobility Overal bed mobility: Needs Assistance Bed Mobility: Supine to Sit     Supine to sit: Modified independent (Device/Increase time);HOB elevated     General bed mobility comments: Use of rails for support.  Transfers Overall transfer level: Needs assistance Equipment used: None Transfers: Sit to/from Stand Sit to Stand: Min guard         General transfer  comment: Min guard for safety and balance.   Ambulation/Gait Ambulation/Gait assistance: Min assist Ambulation Distance (Feet): 15 Feet (x2 bouts) Assistive device: 1 person hand held assist;None Gait Pattern/deviations: Step-through pattern;Decreased stride length Gait velocity: slow   General Gait Details: Handheld assist to ambulate to bathroom, Min A for balance/support. Using BUEs to reach for doorways/walls on the return to the chair due to visual deficits. Mildly unsteady.  Stairs            Wheelchair Mobility    Modified Rankin (Stroke Patients Only)       Balance Overall balance assessment: Needs assistance Sitting-balance support: Feet supported;No upper extremity supported Sitting balance-Leahy Scale: Good     Standing balance support: During functional activity;Single extremity supported Standing balance-Leahy Scale: Fair Standing balance comment: Able to use environment for support during short distance ambulation due to visual deficits and able to perform static standing while urinating without LOB.                              Pertinent Vitals/Pain Pain Assessment: No/denies pain    Home Living Family/patient expects to be discharged to:: Private residence Living Arrangements: Spouse/significant other;Other relatives Available Help at Discharge: Family;Available 24 hours/day Type of Home: House Home Access: Stairs to enter (or can use steps on the side of the house 2 steps without HRs.) Entrance Stairs-Rails: Right;Left;Can reach both Entrance Stairs-Number of Steps: 3 Home Layout: One level Home Equipment: Cane - single point;Bedside commode;Shower seat      Prior Function Level of Independence: Independent with assistive  device(s)         Comments: Independent with ambulation at home and [facility]. (I) with all ADLs and self care.       Hand Dominance   Dominant Hand: Right    Extremity/Trunk  Assessment   Upper Extremity Assessment: Defer to OT evaluation           Lower Extremity Assessment: RLE deficits/detail;Generalized weakness RLE Deficits / Details: Grossly ~3/5 throughout RLE- premorbid from prior CVA.    Cervical / Trunk Assessment:  (Pt keeps eyes closed during PT evaluation.)  Communication   Communication: No difficulties  Cognition Arousal/Alertness: Awake/alert Behavior During Therapy: WFL for tasks assessed/performed Overall Cognitive Status: Within Functional Limits for tasks assessed                      General Comments General comments (skin integrity, edema, etc.): Notified RN to place sticker on call bell so pt can easily find red button due to visual deficits.     Exercises        Assessment/Plan    PT Assessment Patient needs continued PT services  PT Diagnosis Generalized weakness;Difficulty walking   PT Problem List Decreased strength;Impaired sensation;Decreased balance;Decreased mobility  PT Treatment Interventions Balance training;Gait training;Neuromuscular re-education;Stair training;Patient/family education;Functional mobility training;Therapeutic activities;Therapeutic exercise   PT Goals (Current goals can be found in the Care Plan section) Acute Rehab PT Goals Patient Stated Goal: to be able to eat something PT Goal Formulation: With patient Time For Goal Achievement: 06/03/14 Potential to Achieve Goals: Good    Frequency Min 3X/week   Barriers to discharge        Co-evaluation               End of Session Equipment Utilized During Treatment: Gait belt Activity Tolerance: Patient tolerated treatment well Patient left: in chair;with call bell/phone within reach;with family/visitor present (Speech therapy entering room upon PT departure.) Nurse Communication: Mobility status         Time: [id number] PT Time Calculation (min) (ACUTE ONLY): 16 min   Charges:   PT Evaluation $Initial PT Evaluation  Tier I: 1 Procedure PT Treatments $Gait Training: 8-22 mins   PT G CodesWynetta Fines:          Folan, Asa Baudoin A 05/20/2014, 1:20 PM  Alvie HeidelbergShauna Folan, PT, DPT [phone]

## 2014-05-20 NOTE — Progress Notes (Signed)
Stroke Team Progress Note  HISTORY Miguel Diaz. is an 43 y.o. 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 He was at some point involved in the [employer] trial but he said that he is participating in that study anymore. Patient has residual right sided weakness resulting from his stroke last August, and around 2025 tonight he was doing the laundry when his girlfriend noticed that he was not able to move the right side. In addition, he was complaining of HA. EMS was called and described him as unable to move the right side but there is not associated vertigo, slurred speech, or language impairment. He is blind in the left eye and has very poor vision in the right eye. NIHSS 6. CT brain showed no acute intracranial abnormality. Date last known well:  Time last known well:  tPA Given: no, stroke within the past 3 months and  Patient was not administered TPA secondary to mostly old deficits and unclear new deficits. He was admitted to the floor bed for further evaluation and treatment. NIHSS 6 SUBJECTIVE His wife is at the bedside.  Overall he feels his condition is gradually improving.  He just returned from [name] which showed no acute abnormality. Patient`s wife informs me that he complained of chest pressure, headache and generalized weakness and was unable to speak and complete sentences. He improved this am but cannot remember what happened  OBJECTIVE Most recent Vital Signs: Filed Vitals:   05/20/14 0400 05/20/14 0600 05/20/14 0802 05/20/14 1058  BP: 123/76 113/74 120/63 112/74  Pulse: 71 70 69 66  Temp: 97.9 F (36.6 C) 97.7 F (36.5 C) 97.7 F (36.5 C) 98.1 F (36.7 C)  TempSrc: Oral Oral Oral Oral  Resp: 18 18 16    Height:      Weight:      SpO2: 96% 95% 96% 97%   CBG (last 3)    Recent Labs  05/19/14 2132 05/20/14 0649 05/20/14 1117  GLUCAP 228* 135* 239*    IV Fluid Intake:     MEDICATIONS  . aspirin  300 mg Rectal Daily   Or  . aspirin  325 mg Oral Daily  . atorvastatin  40 mg Oral QPM  . FLUoxetine  20 mg Oral Daily  . insulin aspart  0-15 Units Subcutaneous 6 times per day  . insulin glargine  10 Units Subcutaneous Daily  . metFORMIN  1,000 mg Oral BID WC  . pregabalin  150 mg Oral BID  . sodium chloride  3 mL Intravenous Q12H   PRN:  senna-docusate  Diet:  Diet heart healthy/carb modified   Activity:  Bedrest  DVT Prophylaxis:SCDs  CLINICALLY SIGNIFICANT STUDIES Basic Metabolic Panel:  Recent Labs Lab 05/19/14 2109 05/19/14 2119  NA 139 142  K 3.7 3.5*  CL 102 102  CO2 24  --   GLUCOSE 239* 239*  BUN 9 8  CREATININE 0.63 0.60  CALCIUM 9.2  --    Liver Function Tests:  Recent Labs Lab 05/19/14 2109  AST 18  ALT 25  ALKPHOS 104  BILITOT <0.2*  PROT 6.7  ALBUMIN 3.8   CBC:  Recent Labs Lab 05/19/14 2109 05/19/14 2119  WBC 7.5  --   NEUTROABS 4.3  --   HGB 14.1 13.9  HCT 40.0 41.0  MCV 91.7  --  PLT 116*  --    Coagulation:  Recent Labs Lab 05/19/14 2109  LABPROT 13.7  INR 1.04   Cardiac Enzymes: No results for input(s): CKTOTAL, CKMB, CKMBINDEX, TROPONINI in the last 168 hours. Urinalysis: No results for input(s): COLORURINE, LABSPEC, PHURINE, GLUCOSEU, HGBUR, BILIRUBINUR, KETONESUR, PROTEINUR, UROBILINOGEN, NITRITE, LEUKOCYTESUR in the last 168 hours.  Invalid input(s): APPERANCEUR Lipid Panel    Component Value Date/Time   CHOL 100 05/20/2014 0434   TRIG 70 05/20/2014 0434   HDL 31* 05/20/2014 0434   CHOLHDL 3.2 05/20/2014 0434   VLDL 14 05/20/2014 0434   LDLCALC 55 05/20/2014 0434   HgbA1C  Lab Results  Component Value Date   HGBA1C 7.5* 05/20/2014    Urine Drug Screen:     Component Value Date/Time   LABOPIA NONE DETECTED 04/08/2014 1620   COCAINSCRNUR NONE DETECTED 04/08/2014 1620    LABBENZ NONE DETECTED 04/08/2014 1620   AMPHETMU NONE DETECTED 04/08/2014 1620   THCU NONE DETECTED 04/08/2014 1620   LABBARB NONE DETECTED 04/08/2014 1620    Alcohol Level: No results for input(s): ETH in the last 168 hours.  Ct Head (brain) Wo Contrast  05/19/2014   CLINICAL DATA:  Code stroke. Patient with right-sided deficits. History of previous stroke and right-sided blindness.  EXAM: CT HEAD WITHOUT CONTRAST  TECHNIQUE: Contiguous axial images were obtained from the base of the skull through the vertex without intravenous contrast.  COMPARISON:  [name] brain 04/08/2014.  CT head 04/08/2014.  FINDINGS: Ventricles and sulci appear symmetrical. Patchy low-attenuation changes in the deep white matter consistent with known small vessel ischemic change. No mass effect or midline shift. No abnormal extra-axial fluid collections. Gray-white matter junctions are distinct. Basal cisterns are not effaced. No evidence of acute intracranial hemorrhage. No depressed skull fractures. Partial opacification of ethmoid air cells, maxillary antra, and sphenoid sinuses bilaterally with mucosal thickening in the frontal sinuses. No acute air-fluid levels. Opacification of right mastoid air cells.  IMPRESSION: No acute intracranial abnormalities. Chronic small vessel ischemic changes. Chronic appearing inflammatory changes in the paranasal sinuses. Fluid in the right mastoid air cells.  These results were called by telephone at the time of interpretation on 05/19/2014 at 9:26 pm to Dr. Azalia BilisKEVIN CAMPOS , who verbally acknowledged these results.   Electronically Signed   By: Burman NievesWilliam  Stevens M.D.   On: 05/19/2014 21:28   Mr Brain Wo Contrast  05/20/2014   CLINICAL DATA:  Acute right hemiparesis beginning yesterday. Baseline right-sided weakness related to brainstem stroke in 02/2014.  EXAM: [name] HEAD WITHOUT CONTRAST  MRA HEAD WITHOUT CONTRAST  TECHNIQUE: Multiplanar, multiecho pulse sequences of the brain and surrounding  structures were obtained without intravenous contrast. Angiographic images of the head were obtained using MRA technique without contrast.  COMPARISON:  [name] 05/19/2014, [name] 04/08/2014, and MRA 02/11/2014  FINDINGS: [name] HEAD FINDINGS  Images are mildly degraded by motion artifact.  There is no evidence of acute infarct, intracranial hemorrhage, mass, midline shift, or extra-axial fluid collection. There is mild cerebral atrophy. Multiple foci of T2 hyperintensity are again seen throughout the subcortical and deep cerebral white matter bilaterally, similar to the prior [name] and compatible with moderate chronic small vessel ischemic disease.  Mild to moderate mucosal thickening is noted in the frontal sinuses, ethmoid air cells, right sphenoid sinus, and maxillary sinuses. Small right larger than left mastoid effusions are noted. Scalp lipomas are again noted in the occipital regions bilaterally. Bilateral staphylomas are noted. Distal left vertebral artery flow void is  again not well visualized. Other major intracranial vascular flow voids are preserved.  MRA HEAD FINDINGS  Images are mildly to moderately degraded by motion artifact.  Visualized distal right vertebral artery is patent, dominant, and supplies the basilar. The distal left vertebral artery was previously shown to be hypoplastic and end in PICA, not evaluated on the current study due to motion and incomplete imaging of the posterior skullbase. Right PICA origin is patent. Basilar artery is patent without stenosis. SCA origins appear patent. PCAs are patent without evidence of significant proximal stenosis. Mild, diffuse bilateral PCA irregularity is likely due to motion artifact given their appearance on the prior MRA, although underlying atherosclerosis is not excluded.  Internal carotid arteries are patent from skullbase to carotid termini without evidence of stenosis. 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 Left A1 segment is hypoplastic. Anterior communicating artery is patent. No significant proximal ACA stenosis is seen. Right MCA is unremarkable. Left M1 segment is patent without evidence of significant stenosis. Left MCA branch vessels are not well evaluated due to artifact. No intracranial aneurysm is identified.  IMPRESSION: 1. No acute intracranial abnormality identified. 2. Moderate chronic small vessel ischemic disease. 3. Motion degraded MRA without evidence of major intracranial arterial occlusion or significant proximal stenosis. Left MCA branch vessels not well evaluated.   Electronically Signed   By: Sebastian AcheAllen  Grady   On: 05/20/2014 10:15   Mr Maxine GlennMra Head/brain Wo Cm  05/20/2014   CLINICAL DATA:  Acute right hemiparesis beginning yesterday. Baseline right-sided weakness related to brainstem stroke in 02/2014.  EXAM: [name] HEAD WITHOUT CONTRAST  MRA HEAD WITHOUT CONTRAST  TECHNIQUE: Multiplanar, multiecho pulse sequences of the brain and surrounding structures were obtained without intravenous contrast. Angiographic images of the head were obtained using MRA technique without contrast.  COMPARISON:  [name] 05/19/2014, [name] 04/08/2014, and MRA 02/11/2014  FINDINGS: [name] HEAD FINDINGS  Images are mildly degraded by motion artifact.  There is no evidence of acute infarct, intracranial hemorrhage, mass, midline shift, or extra-axial fluid collection. There is mild cerebral atrophy. Multiple foci of T2 hyperintensity are again seen throughout the subcortical and deep cerebral white matter bilaterally, similar to the prior [name] and compatible with moderate chronic small vessel ischemic disease.  Mild to moderate mucosal thickening is noted in the frontal sinuses, ethmoid air cells, right sphenoid sinus, and maxillary sinuses. Small right larger than left mastoid effusions are noted. Scalp lipomas are again noted in the occipital regions bilaterally. Bilateral staphylomas are noted. Distal left vertebral  artery flow void is again not well visualized. Other major intracranial vascular flow voids are preserved.  MRA HEAD FINDINGS  Images are mildly to moderately degraded by motion artifact.  Visualized distal right vertebral artery is patent, dominant, and supplies the basilar. The distal left vertebral artery was previously shown to be hypoplastic and end in PICA, not evaluated on the current study due to motion and incomplete imaging of the posterior skullbase. Right PICA origin is patent. Basilar artery is patent without stenosis. SCA origins appear patent. PCAs are patent without evidence of significant proximal stenosis. Mild, diffuse bilateral PCA irregularity is likely due to motion artifact given their appearance on the prior MRA, although underlying atherosclerosis is not excluded.  Internal carotid arteries are patent from skullbase to carotid termini without evidence of stenosis. Left ICA is slightly smaller in caliber diffusely compared to the right, likely congenital. Left A1 segment is hypoplastic. Anterior communicating artery is  patent. No significant proximal ACA stenosis is seen. Right MCA is unremarkable. Left M1 segment is patent without evidence of significant stenosis. Left MCA branch vessels are not well evaluated due to artifact. No intracranial aneurysm is identified.  IMPRESSION: 1. No acute intracranial abnormality identified. 2. Moderate chronic small vessel ischemic disease. 3. Motion degraded MRA without evidence of major intracranial arterial occlusion or significant proximal stenosis. Left MCA branch vessels not well evaluated.   Electronically Signed   By: Sebastian Ache   On: 05/20/2014 10:15      Carotid Doppler  No significant stenosis 2D Echocardiogram  01/26/14 Left ventricle: The cavity size was normal. Wall thickness was at the upper limits of normal. Systolic function was normal. The estimated ejection fraction was in the range of 60% to 65%. Wall motion was normal;  there were no regional wall motion abnormalities. CXR  02/11/2014 No active cardiopulmonary disease.  EKG  Sinus rhythm RSR' in V1 or V2, right VCD or RVH Borderline T abnormalities, inferior leads No significant change was found  Therapy Recommendations  pending Physical Exam  Awake alert. Afebrile. Head is nontraumatic. Neck is supple without bruit. Hearing is normal. Cardiac exam no murmur or gallop. Lungs are clear to auscultation. Distal pulses are well felt. Neurological Exam ;  Awake  Alert oriented x 3. Normal speech and language. keeps eyes closed but will open to command. Blind left eye and diminished acuity right eye but doubt his cooperation.Marland [place] movements full without nystagmus.fundi were not visualized.  Marland Kitchen Hearing is normal. Palatal movements are normal. Face symmetric. Tongue midline. Normal strength but poor and variable effort with some giveaway weakness right hand., tone, reflexes and coordination. Normal sensation. Gait deferred. ASSESSMENT Mr. Miguel Diaz. is a 43 y.o. male presenting with  Right sided weakness now improving. Exam has nonorganic features and btain [name] shows noa cute infarct  .  On aspirin 325 mg orally every day prior to admission. Now on aspirin 325 mg orally every day for secondary stroke prevention. Patient with resultant  Mild right sided weakness and baseline poor vision.. Stroke work up underway.     Hospital day # 1  TREATMENT/PLAN  Continue  aspirin 325 mg orally every day for secondary stroke prevention.  Check EEG for seizures  Mobilize out of bed  PT/OT consults No need for further stroke w/u as he had it few months ago and [name] is negative I have personally examined this patient, reviewed notes, independently viewed imaging studies, participated in medical decision making and plan of care. I have made any additions or clarifications directly to the above note. Agree with note above. Discussed with wife and patient and answered  questions.  Delia Heady, MD Medical Director [facility] Pager: [phone] 05/20/2014 12:48 PM  SIGNED  Delia Heady, Md  To contact Stroke Continuity provider, please refer to [email or web address]. After hours, contact General Neurology

## 2014-05-20 NOTE — Progress Notes (Signed)
SLP Cancellation Note  Patient Details Name: Miguel CoeGeorge L Mcroy Jr. MRN: [id number] DOB: 1971/05/21   Cancelled treatment:        Attempted speech-language-cognitive assessment.  Pt. Having EEG currently.  Will continue efforts.   Royce MacadamiaLitaker, [name] 05/20/2014, 1:50 PM   [name]

## 2014-05-20 NOTE — Plan of Care (Signed)
Problem: Acute Treatment Outcomes Goal: tPA Patient w/o S&S of bleeding Outcome: Not Applicable Date Met:  24/93/24

## 2014-05-20 NOTE — Progress Notes (Signed)
Inpatient Diabetes Program Recommendations  AACE/ADA: New Consensus Statement on Inpatient Glycemic Control (2013)  Target Ranges:  Prepandial:   less than 140 mg/dL      Peak postprandial:   less than 180 mg/dL (1-2 hours)      Critically ill patients:  140 - 180 mg/dL   Results for Miguel Diaz, Miguel Diaz. (MRN [id number]) as of 05/20/2014 09:48  Ref. Range 04/10/2014 20:10 04/11/2014 07:24 04/11/2014 11:18 05/19/2014 21:32 05/20/2014 06:49  Glucose-Capillary Latest Range: 70-99 mg/dL 010225 (H) 272154 (H) 536216 (H) 228 (H) 135 (H)   Reason for Assessment: hx diabetes, elevated CBG  Diabetes history: Type 2 Outpatient Diabetes medications: Metformin 1000mg  bid, Glucotrol 10mg  daily,  Current orders for Inpatient glycemic control: Metformin 1000mg  bid, Glucotrol 10mg  daily, Novolog sensitive correction tid and hs  Please order an A1C to assess home glucose control.  Please consider d/c Glipizide and ordering basal Lantus insulin if patient continues to be NPO or patient eating poorly.   Miguel RacerJulie Lyndzie Zentz, RN, BA, MHA, CDE Diabetes Coordinator Inpatient Diabetes Program  [phone] (Team Pager) [phone] Miguel Diaz([place] Office) 05/20/2014 10:06 AM

## 2014-05-20 NOTE — Evaluation (Addendum)
Clinical/Bedside Swallow Evaluation Patient Details  Name: Ferne CoeGeorge L Burggraf Jr. MRN: [id number] Date of Birth: 13-Nov-1970  Today's Date: 05/20/2014 Time: 1212-1227 SLP Time Calculation (min) (ACUTE ONLY): 15 min  Past Medical History:  Past Medical History  Diagnosis Date  . Diabetes mellitus without complication   . Neuropathy   . Asthma   . Vision loss of left eye   . Blindness     bilateral  . Hypercholesteremia   . Depression   . Anxiety   . Stroke 02/2014    right sided weakness/numbness   Past Surgical History:  Past Surgical History  Procedure Laterality Date  . Hernia repair    . Eye surgery Left   . Abdominal surgery     HPI:  43 yo male with dm2, CVA with residual right sided weakness c/o increased right sided weakness. Pt came in to ED for evaluation and was seen by neuro. He was not a candidate for TPA. CT brain negative. Pt still has c/o right sided weakness. + numbness, tingling on the right side.    Assessment / Plan / Recommendation Clinical Impression  Pt demonstrates normal swallow function. No SLP f/u needed for dysphagia.     Aspiration Risk  None    Diet Recommendation Regular;Thin liquid   Liquid Administration via: Cup;Straw Medication Administration: Whole meds with liquid    Other  Recommendations Oral Care Recommendations: Oral care BID   Follow Up Recommendations       Frequency and Duration        Pertinent Vitals/Pain NA    SLP Swallow Goals     Swallow Study Prior Functional Status  Type of Home: House Available Help at Discharge: Family;Available 24 hours/day    General HPI: 43 yo male with dm2, CVA with residual right sided weakness c/o increased right sided weakness. Pt came in to ED for evaluation and was seen by neuro. He was not a candidate for TPA. CT brain negative. Pt still has c/o right sided weakness. + numbness, tingling on the right side.  Type of Study: Bedside swallow evaluation Previous Swallow  Assessment: none in chart Diet Prior to this Study: NPO Temperature Spikes Noted: No Respiratory Status: Room air History of Recent Intubation: No Behavior/Cognition: Alert;Cooperative;Pleasant mood Oral Cavity - Dentition: Adequate natural dentition Self-Feeding Abilities: Able to feed self Patient Positioning: Upright in chair Baseline Vocal Quality: Clear;Low vocal intensity Volitional Cough: Strong Volitional Swallow: Able to elicit    Oral/Motor/Sensory Function Overall Oral Motor/Sensory Function: Appears within functional limits for tasks assessed   Ice Chips     Thin Liquid Thin Liquid: Within functional limits    Nectar Thick Nectar Thick Liquid: Not tested   Honey Thick Honey Thick Liquid: Not tested   Puree Puree: Within functional limits   Solid   GO    Solid: Within functional limits       Keziah Avis, Riley NearingBonnie Caroline 05/20/2014,2:57 PM

## 2014-05-20 NOTE — Plan of Care (Signed)
Problem: Acute Treatment Outcomes Goal: Airway maintained/protected Outcome: Completed/Met Date Met:  05/20/14

## 2014-05-20 NOTE — Progress Notes (Signed)
Utilization review completed. Sruthi Maurer, RN, BSN. 

## 2014-05-20 NOTE — Plan of Care (Signed)
Problem: Acute Treatment Outcomes Goal: BP within ordered parameters Outcome: Completed/Met Date Met:  05/20/14     

## 2014-05-20 NOTE — Plan of Care (Signed)
Problem: Consults Goal: Ischemic Stroke Patient Education See Patient Education Module for education specifics. Outcome: Progressing     

## 2014-05-20 NOTE — Progress Notes (Signed)
*  PRELIMINARY RESULTS* Vascular Ultrasound Carotid Duplex (Doppler) has been completed. Findings suggest 1-39% internal carotid artery stenosis bilaterally. Vertebral arteries are patent with antegrade flow.  05/20/2014 11:25 AM Gertie FeyMichelle Brayleigh Rybacki, RVT, RDCS, RDMS

## 2014-05-20 NOTE — Progress Notes (Signed)
PT Cancellation Note  Patient Details Name: Miguel CoeGeorge L Landin Jr. MRN: [id number] DOB: August 12, 1970   Cancelled Treatment:    Reason Eval/Treat Not Completed: Patient not medically ready Pt on bedrest until 11:26 am on 11/16. Will follow up to perform PT evaluation once pt medically stable and bedrest orders have been discontinued.   Alvie HeidelbergFolan, Cillian Gwinner A 05/20/2014, 9:18 AM Alvie HeidelbergShauna Folan, PT, DPT [phone]

## 2014-05-20 NOTE — Plan of Care (Signed)
Problem: Acute Treatment Outcomes Goal: Neuro exam at baseline or improved Outcome: Progressing Goal: Prognosis discussed with family/patient as appropriate Outcome: Progressing  Problem: Progression Outcomes Goal: Communication method established Outcome: Completed/Met Date Met:  05/20/14 Goal: If vent dependent, tolerates weaning Outcome: Not Applicable Date Met:  45/03/88 Goal: Pain controlled Outcome: Completed/Met Date Met:  05/20/14 Goal: Bowel & Bladder Continence Outcome: Completed/Met Date Met:  05/20/14

## 2014-05-20 NOTE — Progress Notes (Signed)
PROGRESS NOTE  Miguel CoeGeorge L Diaz Jr. RUE:[id number] DOB: [birth year] DOA: 05/19/2014 PCP: [name]  Assessment/Plan: Right sided weakness: [name] brain ok carotid u/s, cardiac 2D echo pending Check tsh:  Lipid: LDL 55, HDL 31 (on lipitor) Appreciate neurology input Aspirin, lipitor.   CP: likely musculoskeletal Check trop i q6h x3,   Dm2:  SSI q 4 hours D/c glipizide  Hyperlipidemia Cont lipitor  Thrombocytopenia monitor  Code Status: full Family Communication: patient Disposition Plan:    Consultants:  neuro  Procedures:      HPI/Subjective: Eating lunch, feeling better  no SOB, no CP  Objective: Filed Vitals:   05/20/14 0802  BP: 120/63  Pulse: 69  Temp: 97.7 F (36.5 C)  Resp: 16    Intake/Output Summary (Last 24 hours) at 05/20/14 1009 Last data filed at 05/19/14 2304  Gross per 24 hour  Intake      0 ml  Output    425 ml  Net   -425 ml   Filed Weights   05/19/14 2123 05/19/14 2327  Weight: 108.863 kg (240 lb) 86.183 kg (190 lb)    Exam:   General:  Pleasant/cooperative  Cardiovascular: rrr  Respiratory: clear  Abdomen: +BS, soft  Musculoskeletal: moves all 4 ext   Data Reviewed: Basic Metabolic Panel:  Recent Labs Lab 05/19/14 2109 05/19/14 2119  NA 139 142  K 3.7 3.5*  CL 102 102  CO2 24  --   GLUCOSE 239* 239*  BUN 9 8  CREATININE 0.63 0.60  CALCIUM 9.2  --    Liver Function Tests:  Recent Labs Lab 05/19/14 2109  AST 18  ALT 25  ALKPHOS 104  BILITOT <0.2*  PROT 6.7  ALBUMIN 3.8   No results for input(s): LIPASE, AMYLASE in the last 168 hours. No results for input(s): AMMONIA in the last 168 hours. CBC:  Recent Labs Lab 05/19/14 2109 05/19/14 2119  WBC 7.5  --   NEUTROABS 4.3  --   HGB 14.1 13.9  HCT 40.0 41.0  MCV 91.7  --   PLT 116*  --    Cardiac Enzymes: No results for input(s): CKTOTAL, CKMB, CKMBINDEX, TROPONINI in the last 168 hours. BNP (last 3 results) No results for  input(s): PROBNP in the last 8760 hours. CBG:  Recent Labs Lab 05/19/14 2132 05/20/14 0649  GLUCAP 228* 135*    No results found for this or any previous visit (from the past 240 hour(s)).   Studies: Ct Head (brain) Wo Contrast  05/19/2014   CLINICAL DATA:  Code stroke. Patient with right-sided deficits. History of previous stroke and right-sided blindness.  EXAM: CT HEAD WITHOUT CONTRAST  TECHNIQUE: Contiguous axial images were obtained from the base of the skull through the vertex without intravenous contrast.  COMPARISON:  [name] brain 04/08/2014.  CT head 04/08/2014.  FINDINGS: Ventricles and sulci appear symmetrical. Patchy low-attenuation changes in the deep white matter consistent with known small vessel ischemic change. No mass effect or midline shift. No abnormal extra-axial fluid collections. Gray-white matter junctions are distinct. Basal cisterns are not effaced. No evidence of acute intracranial hemorrhage. No depressed skull fractures. Partial opacification of ethmoid air cells, maxillary antra, and sphenoid sinuses bilaterally with mucosal thickening in the frontal sinuses. No acute air-fluid levels. Opacification of right mastoid air cells.  IMPRESSION: No acute intracranial abnormalities. Chronic small vessel ischemic changes. Chronic appearing inflammatory changes in the paranasal sinuses. Fluid in the right mastoid air cells.  These results were called by  telephone at the time of interpretation on 05/19/2014 at 9:26 pm to Dr. Azalia BilisKEVIN CAMPOS , who verbally acknowledged these results.   Electronically Signed   By: Burman NievesWilliam  Stevens M.D.   On: 05/19/2014 21:28    Scheduled Meds: . aspirin  300 mg Rectal Daily   Or  . aspirin  325 mg Oral Daily  . atorvastatin  40 mg Oral QPM  . FLUoxetine  20 mg Oral Daily  . insulin aspart  0-15 Units Subcutaneous 6 times per day  . insulin glargine  10 Units Subcutaneous Daily  . metFORMIN  1,000 mg Oral BID WC  . pregabalin  150 mg Oral BID    . sodium chloride  3 mL Intravenous Q12H   Continuous Infusions:  Antibiotics Given (last 72 hours)    None      Active Problems:   Essential hypertension, benign   DM type 2 with diabetic peripheral neuropathy   Right sided weakness   Cerebral thrombosis with cerebral infarction    Time spent: 35 min    [name]  [facility] Pager [phone]. If 7PM-7AM, please contact night-coverage at [email or web address], password [email or web address] 05/20/2014, 10:09 AM  LOS: 1 day

## 2014-05-21 LAB — GLUCOSE, CAPILLARY
GLUCOSE-CAPILLARY: 129 mg/dL — AB (ref 70–99)
Glucose-Capillary: 142 mg/dL — ABNORMAL HIGH (ref 70–99)
Glucose-Capillary: 183 mg/dL — ABNORMAL HIGH (ref 70–99)

## 2014-05-21 MED ORDER — ASPIRIN 325 MG PO TABS: 325.0000 mg | ORAL_TABLET | Freq: Every day | ORAL | Status: DC

## 2014-05-21 NOTE — Progress Notes (Signed)
SLP Cancellation Note  Patient Details Name: Miguel Diaz L Kressin Jr. MRN: [id number] DOB: 07/16/1970   Cancelled treatment:       Reason Eval/Treat Not Completed: Other (comment). Unable to complete SLP cognitive linguistic evaluation prior to d/c. Would recommend outpatient or home health SLP referral and evaluation given wife report of persistent memory/cognitive deficits.    [name], Riley NearingBonnie Caroline 05/21/2014, 12:49 PM

## 2014-05-21 NOTE — Progress Notes (Signed)
Physical Therapy Treatment Patient Details Name: Miguel CoeGeorge L Maffei Jr. MRN: [id number] DOB: Apr 09, 1971 Today's Date: 05/21/2014    History of Present Illness 43 y.o. male admitted with R side weakness concerning for CVA. Pt was doing the laundry on evening of 11/15 when his girlfriend noticed that he was not able to move his right side. In addition, he was complaining of HA. EMS was called and described him as unable to move the right side. [name]-(-). [name] brain (-).  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     PT Comments    Patient is making much better progress this afternoon. No LOB with gait and able to complete stair training. Patient safe to D/C from a mobility standpoint based on progression towards goals set on PT eval.    Follow Up Recommendations  Supervision/Assistance - 24 hour     Equipment Recommendations  None recommended by PT    Recommendations for Other Services       Precautions / Restrictions Precautions Precautions: Fall Precaution Comments: blind in L eye and minimal vision R eye.    Mobility  Bed Mobility Overal bed mobility: Modified Independent                Transfers Overall transfer level: Modified independent                  Ambulation/Gait Ambulation/Gait assistance: Min guard Ambulation Distance (Feet): 230 Feet Assistive device: Straight cane Gait Pattern/deviations: Step-through pattern;Decreased stride length     General Gait Details: direction cues in unfimilair enviroment   Stairs Stairs: Yes Stairs assistance: Min guard Stair Management: One rail Left;Step to pattern;Forwards Number of Stairs: 3 General stair comments: Minguard for safety. Patient with correct technique  Wheelchair Mobility    Modified Rankin (Stroke Patients Only)       Balance                                    Cognition  Arousal/Alertness: Awake/alert Behavior During Therapy: WFL for tasks assessed/performed Overall Cognitive Status: Within Functional Limits for tasks assessed                      Exercises      General Comments        Pertinent Vitals/Pain Pain Assessment: No/denies pain    Home Living                      Prior Function            PT Goals (current goals can now be found in the care plan section) Progress towards PT goals: Progressing toward goals    Frequency  Min 3X/week    PT Plan Current plan remains appropriate    Co-evaluation             End of Session Equipment Utilized During Treatment: Gait belt Activity Tolerance: Patient tolerated treatment well Patient left: in chair;with call bell/phone within reach;with family/visitor present     Time: [id number] PT Time Calculation (min) (ACUTE ONLY): 13 min  Charges:  $Gait Training: 8-22 mins                    G Codes:      Fredrich BirksRobinette, Julia Elizabeth 05/21/2014, 2:27 PM 05/21/2014 Fredrich Birksobinette, Julia Elizabeth PTA [phone]  pager [phone] office

## 2014-05-21 NOTE — Progress Notes (Signed)
Pt transported out of unit per wheelchair by nurse tech, Misty StanleyLisa. No acute distress noted. Pt had belongings in hand.   Andrew AuVafiadis, Tyann Niehaus I 05/21/2014 3:58 PM

## 2014-05-21 NOTE — Discharge Summary (Signed)
Physician Discharge Summary  [name]. ZOX:[id number] DOB: 27-Feb-1971 DOA: 05/19/2014  PCP: [name]  Admit date: 05/19/2014 Discharge date: 05/21/2014  Time spent: 35 minutes  Recommendations for Outpatient Follow-up:  1. Supervision by family  Discharge Diagnoses:  Active Problems:   Essential hypertension, benign   DM type 2 with diabetic peripheral neuropathy   Right sided weakness   Cerebral thrombosis with cerebral infarction   Discharge Condition: improved  Diet recommendation:  Cardiac/diabetic  Filed Weights   05/19/14 2123 05/19/14 2327  Weight: 108.863 kg (240 lb) 86.183 kg (190 lb)    History of present illness:  43 yo male with dm2, CVA with residual right sided weakness c/o increased right sided weakness starting this evening. Pt is not aware exactly what time it started this evening. Pt came in for evaluation. Per ED, seen by neuro and not a candidate for TPA. CT brain negative. Pt still has c/o right sided weakness. + numbness, tingling on the right side. Denies dysphagia, dysarthria. Pt will be admitted for r/o CVA.   Hospital Course:  Right sided weakness- has been off his ASA x 5 days.  [name] negative symptoms resolved.  OT saw- no follow up, just supervision On statin, resume ASA -seen by neuro- not believed to be TIA   Procedures:    Consultations:  neuro  Discharge Exam: Filed Vitals:   05/21/14 1023  BP: 102/66  Pulse: 70  Temp: 97.9 F (36.6 C)  Resp: 20    General: A+Ox3, NAd Cardiovascular: rrr Respiratory: clear  Discharge Instructions You were cared for by a hospitalist during your hospital stay. If you have any questions about your discharge medications or the care you received while you were in the hospital after you are discharged, you can call the unit and asked to speak with the hospitalist on call if the hospitalist that took care of you is not available. Once you are discharged, your primary care  physician will handle any further medical issues. Please note that NO REFILLS for any discharge medications will be authorized once you are discharged, as it is imperative that you return to your primary care physician (or establish a relationship with a primary care physician if you do not have one) for your aftercare needs so that they can reassess your need for medications and monitor your lab values.  Discharge Instructions    Diet - low sodium heart healthy    Complete by:  As directed      Diet Carb Modified    Complete by:  As directed      Discharge instructions    Complete by:  As directed   Supervision by wife     Increase activity slowly    Complete by:  As directed           Current Discharge Medication List    START taking these medications   Details  aspirin 325 MG tablet Take 1 tablet (325 mg total) by mouth daily. Qty: 30 tablet, Refills: 0      CONTINUE these medications which have NOT CHANGED   Details  albuterol (PROVENTIL HFA;VENTOLIN HFA) 108 (90 BASE) MCG/ACT inhaler Inhale 2 puffs into the lungs every 6 (six) hours as needed for wheezing or shortness of breath.    atorvastatin (LIPITOR) 40 MG tablet Take 40 mg by mouth every evening.     FLUoxetine (PROZAC) 20 MG capsule Take 20 mg by mouth daily.    glipiZIDE (GLUCOTROL XL) 10  MG 24 hr tablet Take 10 mg by mouth daily with breakfast.    ibuprofen (ADVIL,MOTRIN) 600 MG tablet Take 600 mg by mouth every 6 (six) hours as needed for moderate pain.     metFORMIN (GLUMETZA) 500 MG (MOD) 24 hr tablet Take 1,000 mg by mouth 2 (two) times daily.    Multiple Vitamins-Minerals (MULTIVITAMIN WITH MINERALS) tablet Take 1 tablet by mouth daily.    pregabalin (LYRICA) 150 MG capsule Take 150 mg by mouth 2 (two) times daily.       Allergies  Allergen Reactions  . Penicillins Other (See Comments)    Childhood allergy  . Penicillins     unknown  . Toradol [Ketorolac Tromethamine] Hives  . Toradol [Ketorolac  Tromethamine]     Hives       The results of significant diagnostics from this hospitalization (including imaging, microbiology, ancillary and laboratory) are listed below for reference.    Significant Diagnostic Studies: Ct Head (brain) Wo Contrast  05/19/2014   CLINICAL DATA:  Code stroke. Patient with right-sided deficits. History of previous stroke and right-sided blindness.  EXAM: CT HEAD WITHOUT CONTRAST  TECHNIQUE: Contiguous axial images were obtained from the base of the skull through the vertex without intravenous contrast.  COMPARISON:  [name] brain 04/08/2014.  CT head 04/08/2014.  FINDINGS: Ventricles and sulci appear symmetrical. Patchy low-attenuation changes in the deep white matter consistent with known small vessel ischemic change. No mass effect or midline shift. No abnormal extra-axial fluid collections. Gray-white matter junctions are distinct. Basal cisterns are not effaced. No evidence of acute intracranial hemorrhage. No depressed skull fractures. Partial opacification of ethmoid air cells, maxillary antra, and sphenoid sinuses bilaterally with mucosal thickening in the frontal sinuses. No acute air-fluid levels. Opacification of right mastoid air cells.  IMPRESSION: No acute intracranial abnormalities. Chronic small vessel ischemic changes. Chronic appearing inflammatory changes in the paranasal sinuses. Fluid in the right mastoid air cells.  These results were called by telephone at the time of interpretation on 05/19/2014 at 9:26 pm to Dr. Azalia BilisKEVIN CAMPOS , who verbally acknowledged these results.   Electronically Signed   By: Burman NievesWilliam  Stevens M.D.   On: 05/19/2014 21:28   Mr Brain Wo Contrast  05/20/2014   CLINICAL DATA:  Acute right hemiparesis beginning yesterday. Baseline right-sided weakness related to brainstem stroke in 02/2014.  EXAM: [name] HEAD WITHOUT CONTRAST  MRA HEAD WITHOUT CONTRAST  TECHNIQUE: Multiplanar, multiecho pulse sequences of the brain and surrounding  structures were obtained without intravenous contrast. Angiographic images of the head were obtained using MRA technique without contrast.  COMPARISON:  [name] 05/19/2014, [name] 04/08/2014, and MRA 02/11/2014  FINDINGS: [name] HEAD FINDINGS  Images are mildly degraded by motion artifact.  There is no evidence of acute infarct, intracranial hemorrhage, mass, midline shift, or extra-axial fluid collection. There is mild cerebral atrophy. Multiple foci of T2 hyperintensity are again seen throughout the subcortical and deep cerebral white matter bilaterally, similar to the prior [name] and compatible with moderate chronic small vessel ischemic disease.  Mild to moderate mucosal thickening is noted in the frontal sinuses, ethmoid air cells, right sphenoid sinus, and maxillary sinuses. Small right larger than left mastoid effusions are noted. Scalp lipomas are again noted in the occipital regions bilaterally. Bilateral staphylomas are noted. Distal left vertebral artery flow void is again not well visualized. Other major intracranial vascular flow voids are preserved.  MRA HEAD FINDINGS  Images are mildly to moderately degraded by motion artifact.  Visualized distal  right vertebral artery is patent, dominant, and supplies the basilar. The distal left vertebral artery was previously shown to be hypoplastic and end in PICA, not evaluated on the current study due to motion and incomplete imaging of the posterior skullbase. Right PICA origin is patent. Basilar artery is patent without stenosis. SCA origins appear patent. PCAs are patent without evidence of significant proximal stenosis. Mild, diffuse bilateral PCA irregularity is likely due to motion artifact given their appearance on the prior MRA, although underlying atherosclerosis is not excluded.  Internal carotid arteries are patent from skullbase to carotid termini without evidence of stenosis. 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 Left A1 segment is hypoplastic. Anterior communicating artery is patent. No significant proximal ACA stenosis is seen. Right MCA is unremarkable. Left M1 segment is patent without evidence of significant stenosis. Left MCA branch vessels are not well evaluated due to artifact. No intracranial aneurysm is identified.  IMPRESSION: 1. No acute intracranial abnormality identified. 2. Moderate chronic small vessel ischemic disease. 3. Motion degraded MRA without evidence of major intracranial arterial occlusion or significant proximal stenosis. Left MCA branch vessels not well evaluated.   Electronically Signed   By: Sebastian Ache   On: 05/20/2014 10:15   Mr Maxine Glenn Head/brain Wo Cm  05/20/2014   CLINICAL DATA:  Acute right hemiparesis beginning yesterday. Baseline right-sided weakness related to brainstem stroke in 02/2014.  EXAM: [name] HEAD WITHOUT CONTRAST  MRA HEAD WITHOUT CONTRAST  TECHNIQUE: Multiplanar, multiecho pulse sequences of the brain and surrounding structures were obtained without intravenous contrast. Angiographic images of the head were obtained using MRA technique without contrast.  COMPARISON:  [name] 05/19/2014, [name] 04/08/2014, and MRA 02/11/2014  FINDINGS: [name] HEAD FINDINGS  Images are mildly degraded by motion artifact.  There is no evidence of acute infarct, intracranial hemorrhage, mass, midline shift, or extra-axial fluid collection. There is mild cerebral atrophy. Multiple foci of T2 hyperintensity are again seen throughout the subcortical and deep cerebral white matter bilaterally, similar to the prior [name] and compatible with moderate chronic small vessel ischemic disease.  Mild to moderate mucosal thickening is noted in the frontal sinuses, ethmoid air cells, right sphenoid sinus, and maxillary sinuses. Small right larger than left mastoid effusions are noted. Scalp lipomas are again noted in the occipital regions bilaterally. Bilateral staphylomas are noted. Distal left vertebral  artery flow void is again not well visualized. Other major intracranial vascular flow voids are preserved.  MRA HEAD FINDINGS  Images are mildly to moderately degraded by motion artifact.  Visualized distal right vertebral artery is patent, dominant, and supplies the basilar. The distal left vertebral artery was previously shown to be hypoplastic and end in PICA, not evaluated on the current study due to motion and incomplete imaging of the posterior skullbase. Right PICA origin is patent. Basilar artery is patent without stenosis. SCA origins appear patent. PCAs are patent without evidence of significant proximal stenosis. Mild, diffuse bilateral PCA irregularity is likely due to motion artifact given their appearance on the prior MRA, although underlying atherosclerosis is not excluded.  Internal carotid arteries are patent from skullbase to carotid termini without evidence of stenosis. Left ICA is slightly smaller in caliber diffusely compared to the right, likely congenital. Left A1 segment is hypoplastic. Anterior communicating artery is patent. No significant proximal ACA stenosis is seen. Right MCA is unremarkable. Left M1 segment is patent without evidence of significant stenosis. Left MCA branch vessels are not well  evaluated due to artifact. No intracranial aneurysm is identified.  IMPRESSION: 1. No acute intracranial abnormality identified. 2. Moderate chronic small vessel ischemic disease. 3. Motion degraded MRA without evidence of major intracranial arterial occlusion or significant proximal stenosis. Left MCA branch vessels not well evaluated.   Electronically Signed   By: Sebastian AcheAllen  Grady   On: 05/20/2014 10:15    Microbiology: No results found for this or any previous visit (from the past 240 hour(s)).   Labs: Basic Metabolic Panel:  Recent Labs Lab 05/19/14 2109 05/19/14 2119  NA 139 142  K 3.7 3.5*  CL 102 102  CO2 24  --   GLUCOSE 239* 239*  BUN 9 8  CREATININE 0.63 0.60  CALCIUM  9.2  --    Liver Function Tests:  Recent Labs Lab 05/19/14 2109  AST 18  ALT 25  ALKPHOS 104  BILITOT <0.2*  PROT 6.7  ALBUMIN 3.8   No results for input(s): LIPASE, AMYLASE in the last 168 hours. No results for input(s): AMMONIA in the last 168 hours. CBC:  Recent Labs Lab 05/19/14 2109 05/19/14 2119  WBC 7.5  --   NEUTROABS 4.3  --   HGB 14.1 13.9  HCT 40.0 41.0  MCV 91.7  --   PLT 116*  --    Cardiac Enzymes: No results for input(s): CKTOTAL, CKMB, CKMBINDEX, TROPONINI in the last 168 hours. BNP: BNP (last 3 results) No results for input(s): PROBNP in the last 8760 hours. CBG:  Recent Labs Lab 05/20/14 2009 05/20/14 2355 05/21/14 0349 05/21/14 0816 05/21/14 1133  GLUCAP 81 114* 142* 129* 183*       Signed:  VANN, JESSICA  [facility] 05/21/2014, 12:43 PM

## 2014-05-21 NOTE — Progress Notes (Signed)
Stroke Team Progress Note  HISTORY Miguel CoeGeorge L Matas Jr. is an 43 y.o. 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 He was at some point involved in the [employer] trial but he said that he is participating in that study anymore. Patient has residual right sided weakness resulting from his stroke last August, and around 2025 tonight he was doing the laundry when his girlfriend noticed that he was not able to move the right side. In addition, he was complaining of HA. EMS was called and described him as unable to move the right side but there is not associated vertigo, slurred speech, or language impairment. He is blind in the left eye and has very poor vision in the right eye. NIHSS 6. CT brain showed no acute intracranial abnormality. Date last known well:  Time last known well:  tPA Given: no, stroke within the past 3 months and  Patient was not administered TPA secondary to mostly old deficits and unclear new deficits. He was admitted to the floor bed for further evaluation and treatment. NIHSS 6 SUBJECTIVE His wife is not at the bedside.  Overall he feels his condition is much improved.    He improved this am but cannot remember what happened.EEG is normal.  OBJECTIVE Most recent Vital Signs: Filed Vitals:   05/21/14 0206 05/21/14 0700 05/21/14 1023 05/21/14 1335  BP: 109/67 124/69 102/66 120/76  Pulse: 74 69 70 76  Temp: 98.2 F (36.8 C) 97.7 F (36.5 C) 97.9 F (36.6 C) 97.7 F (36.5 C)  TempSrc: Oral Oral Oral Oral  Resp:  20 20 20   Height:      Weight:      SpO2: 96% 96% 96% 97%   CBG (last 3)   Recent Labs  05/21/14 0349 05/21/14 0816 05/21/14 1133  GLUCAP 142* 129* 183*    IV Fluid Intake:     MEDICATIONS  . aspirin  300 mg Rectal Daily   Or  . aspirin  325 mg Oral  Daily  . atorvastatin  40 mg Oral QPM  . FLUoxetine  20 mg Oral Daily  . insulin aspart  0-15 Units Subcutaneous 6 times per day  . insulin glargine  10 Units Subcutaneous Daily  . metFORMIN  1,000 mg Oral BID WC  . pregabalin  150 mg Oral BID  . sodium chloride  3 mL Intravenous Q12H   PRN:  senna-docusate  Diet:  Diet heart healthy/carb modified Diet - low sodium heart healthy Diet Carb Modified   Activity:  Bedrest  DVT Prophylaxis:SCDs  CLINICALLY SIGNIFICANT STUDIES Basic Metabolic Panel:   Recent Labs Lab 05/19/14 2109 05/19/14 2119  NA 139 142  K 3.7 3.5*  CL 102 102  CO2 24  --   GLUCOSE 239* 239*  BUN 9 8  CREATININE 0.63 0.60  CALCIUM 9.2  --    Liver Function Tests:   Recent Labs Lab 05/19/14 2109  AST 18  ALT 25  ALKPHOS 104  BILITOT <0.2*  PROT 6.7  ALBUMIN 3.8   CBC:   Recent Labs Lab 05/19/14 2109 05/19/14 2119  WBC 7.5  --   NEUTROABS 4.3  --   HGB 14.1 13.9  HCT 40.0 41.0  MCV 91.7  --   PLT 116*  --    Coagulation:   Recent Labs Lab  05/19/14 2109  LABPROT 13.7  INR 1.04   Cardiac Enzymes: No results for input(s): CKTOTAL, CKMB, CKMBINDEX, TROPONINI in the last 168 hours. Urinalysis: No results for input(s): COLORURINE, LABSPEC, PHURINE, GLUCOSEU, HGBUR, BILIRUBINUR, KETONESUR, PROTEINUR, UROBILINOGEN, NITRITE, LEUKOCYTESUR in the last 168 hours.  Invalid input(s): APPERANCEUR Lipid Panel    Component Value Date/Time   CHOL 100 05/20/2014 0434   TRIG 70 05/20/2014 0434   HDL 31* 05/20/2014 0434   CHOLHDL 3.2 05/20/2014 0434   VLDL 14 05/20/2014 0434   LDLCALC 55 05/20/2014 0434   HgbA1C  Lab Results  Component Value Date   HGBA1C 7.5* 05/20/2014    Urine Drug Screen:      Component Value Date/Time   LABOPIA NONE DETECTED 04/08/2014 1620   COCAINSCRNUR NONE DETECTED 04/08/2014 1620   LABBENZ NONE DETECTED 04/08/2014 1620   AMPHETMU NONE DETECTED 04/08/2014 1620   THCU NONE DETECTED 04/08/2014 1620   LABBARB  NONE DETECTED 04/08/2014 1620    Alcohol Level: No results for input(s): ETH in the last 168 hours.  Ct Head (brain) Wo Contrast  05/19/2014   CLINICAL DATA:  Code stroke. Patient with right-sided deficits. History of previous stroke and right-sided blindness.  EXAM: CT HEAD WITHOUT CONTRAST  TECHNIQUE: Contiguous axial images were obtained from the base of the skull through the vertex without intravenous contrast.  COMPARISON:  [name] brain 04/08/2014.  CT head 04/08/2014.  FINDINGS: Ventricles and sulci appear symmetrical. Patchy low-attenuation changes in the deep white matter consistent with known small vessel ischemic change. No mass effect or midline shift. No abnormal extra-axial fluid collections. Gray-white matter junctions are distinct. Basal cisterns are not effaced. No evidence of acute intracranial hemorrhage. No depressed skull fractures. Partial opacification of ethmoid air cells, maxillary antra, and sphenoid sinuses bilaterally with mucosal thickening in the frontal sinuses. No acute air-fluid levels. Opacification of right mastoid air cells.  IMPRESSION: No acute intracranial abnormalities. Chronic small vessel ischemic changes. Chronic appearing inflammatory changes in the paranasal sinuses. Fluid in the right mastoid air cells.  These results were called by telephone at the time of interpretation on 05/19/2014 at 9:26 pm to Dr. Azalia Bilis , who verbally acknowledged these results.   Electronically Signed   By: Burman Nieves M.D.   On: 05/19/2014 21:28   Mr Brain Wo Contrast  05/20/2014   CLINICAL DATA:  Acute right hemiparesis beginning yesterday. Baseline right-sided weakness related to brainstem stroke in 02/2014.  EXAM: [name] HEAD WITHOUT CONTRAST  MRA HEAD WITHOUT CONTRAST  TECHNIQUE: Multiplanar, multiecho pulse sequences of the brain and surrounding structures were obtained without intravenous contrast. Angiographic images of the head were obtained using MRA technique without  contrast.  COMPARISON:  [name] 05/19/2014, [name] 04/08/2014, and MRA 02/11/2014  FINDINGS: [name] HEAD FINDINGS  Images are mildly degraded by motion artifact.  There is no evidence of acute infarct, intracranial hemorrhage, mass, midline shift, or extra-axial fluid collection. There is mild cerebral atrophy. Multiple foci of T2 hyperintensity are again seen throughout the subcortical and deep cerebral white matter bilaterally, similar to the prior [name] and compatible with moderate chronic small vessel ischemic disease.  Mild to moderate mucosal thickening is noted in the frontal sinuses, ethmoid air cells, right sphenoid sinus, and maxillary sinuses. Small right larger than left mastoid effusions are noted. Scalp lipomas are again noted in the occipital regions bilaterally. Bilateral staphylomas are noted. Distal left vertebral artery flow void is again not well visualized. Other major intracranial vascular flow voids are  preserved.  MRA HEAD FINDINGS  Images are mildly to moderately degraded by motion artifact.  Visualized distal right vertebral artery is patent, dominant, and supplies the basilar. The distal left vertebral artery was previously shown to be hypoplastic and end in PICA, not evaluated on the current study due to motion and incomplete imaging of the posterior skullbase. Right PICA origin is patent. Basilar artery is patent without stenosis. SCA origins appear patent. PCAs are patent without evidence of significant proximal stenosis. Mild, diffuse bilateral PCA irregularity is likely due to motion artifact given their appearance on the prior MRA, although underlying atherosclerosis is not excluded.  Internal carotid arteries are patent from skullbase to carotid termini without evidence of stenosis. Left ICA is slightly smaller in caliber diffusely compared to the right, likely congenital. Left A1 segment is hypoplastic. Anterior communicating artery is patent. No significant proximal ACA stenosis is seen.  Right MCA is unremarkable. Left M1 segment is patent without evidence of significant stenosis. Left MCA branch vessels are not well evaluated due to artifact. No intracranial aneurysm is identified.  IMPRESSION: 1. No acute intracranial abnormality identified. 2. Moderate chronic small vessel ischemic disease. 3. Motion degraded MRA without evidence of major intracranial arterial occlusion or significant proximal stenosis. Left MCA branch vessels not well evaluated.   Electronically Signed   By: Sebastian AcheAllen  Grady   On: 05/20/2014 10:15   Mr Maxine GlennMra Head/brain Wo Cm  05/20/2014   CLINICAL DATA:  Acute right hemiparesis beginning yesterday. Baseline right-sided weakness related to brainstem stroke in 02/2014.  EXAM: [name] HEAD WITHOUT CONTRAST  MRA HEAD WITHOUT CONTRAST  TECHNIQUE: Multiplanar, multiecho pulse sequences of the brain and surrounding structures were obtained without intravenous contrast. Angiographic images of the head were obtained using MRA technique without contrast.  COMPARISON:  [name] 05/19/2014, [name] 04/08/2014, and MRA 02/11/2014  FINDINGS: [name] HEAD FINDINGS  Images are mildly degraded by motion artifact.  There is no evidence of acute infarct, intracranial hemorrhage, mass, midline shift, or extra-axial fluid collection. There is mild cerebral atrophy. Multiple foci of T2 hyperintensity are again seen throughout the subcortical and deep cerebral white matter bilaterally, similar to the prior [name] and compatible with moderate chronic small vessel ischemic disease.  Mild to moderate mucosal thickening is noted in the frontal sinuses, ethmoid air cells, right sphenoid sinus, and maxillary sinuses. Small right larger than left mastoid effusions are noted. Scalp lipomas are again noted in the occipital regions bilaterally. Bilateral staphylomas are noted. Distal left vertebral artery flow void is again not well visualized. Other major intracranial vascular flow voids are preserved.  MRA HEAD FINDINGS   Images are mildly to moderately degraded by motion artifact.  Visualized distal right vertebral artery is patent, dominant, and supplies the basilar. The distal left vertebral artery was previously shown to be hypoplastic and end in PICA, not evaluated on the current study due to motion and incomplete imaging of the posterior skullbase. Right PICA origin is patent. Basilar artery is patent without stenosis. SCA origins appear patent. PCAs are patent without evidence of significant proximal stenosis. Mild, diffuse bilateral PCA irregularity is likely due to motion artifact given their appearance on the prior MRA, although underlying atherosclerosis is not excluded.  Internal carotid arteries are patent from skullbase to carotid termini without evidence of stenosis. Left ICA is slightly smaller in caliber diffusely compared to the right, likely congenital. Left A1 segment is hypoplastic. Anterior communicating artery is patent. No significant proximal ACA stenosis is seen. Right MCA is  unremarkable. Left M1 segment is patent without evidence of significant stenosis. Left MCA branch vessels are not well evaluated due to artifact. No intracranial aneurysm is identified.  IMPRESSION: 1. No acute intracranial abnormality identified. 2. Moderate chronic small vessel ischemic disease. 3. Motion degraded MRA without evidence of major intracranial arterial occlusion or significant proximal stenosis. Left MCA branch vessels not well evaluated.   Electronically Signed   By: Sebastian Ache   On: 05/20/2014 10:15      Carotid Doppler  No significant stenosis 2D Echocardiogram  01/26/14 Left ventricle: The cavity size was normal. Wall thickness was at the upper limits of normal. Systolic function was normal. The estimated ejection fraction was in the range of 60% to 65%. Wall motion was normal; there were no regional wall motion abnormalities. CXR  02/11/2014 No active cardiopulmonary disease.  EKG  Sinus  rhythm RSR' in V1 or V2, right VCD or RVH Borderline T abnormalities, inferior leads No significant change was found  Therapy Recommendations  pending Physical Exam  Awake alert. Afebrile. Head is nontraumatic. Neck is supple without bruit. Hearing is normal. Cardiac exam no murmur or gallop. Lungs are clear to auscultation. Distal pulses are well felt. Neurological Exam ;  Awake  Alert oriented x 3. Normal speech and language. keeps eyes closed but will open to command. Blind left eye and diminished acuity right eye but doubt his cooperation.Marland [place] movements full without nystagmus.fundi were not visualized.  Marland Kitchen Hearing is normal. Palatal movements are normal. Face symmetric. Tongue midline. Normal strength but poor and variable effort with some giveaway weakness right hand., tone, reflexes and coordination. Normal sensation. Gait deferred. ASSESSMENT Miguel Diaz. is a 44 y.o. male presenting with  Right sided weakness now improving. Exam has nonorganic features and btain [name] shows noa cute infarct  .  On aspirin 325 mg orally every day prior to admission. Now on aspirin 325 mg orally every day for secondary stroke prevention. Patient with resultant  Mild right sided weakness and baseline poor vision.. Stroke work up underway.     Hospital day # 2  TREATMENT/PLAN  Continue  aspirin 325 mg orally every day for secondary stroke prevention.  Check EEG for seizures  Mobilize out of bed  PT/OT consults No need for further stroke w/u as he had it few months ago and [name] is negative. Stroke team will sign off Call for questions I have personally examined this patient, reviewed notes, independently viewed imaging studies, participated in medical decision making and plan of care. I have made any additions or clarifications directly to the above note. Agree with note above. Discussed with wife and patient and answered questions.  Delia Heady, MD Medical Director [facility] Pager: [phone] 05/21/2014 2:20 PM        To contact Stroke Continuity provider, please refer to [email or web address]. After hours, contact General Neurology

## 2014-05-21 NOTE — Progress Notes (Signed)
Talked to patient about Outpatient Speech Therapy; [place] [facility]; Orders/ clinical information faxed to the rehab facility and they will contact the patient with a start up date and time of therapy; Alexis GoodellB Brindley Madarang RN,BSN,MHA [phone]

## 2014-05-21 NOTE — Progress Notes (Signed)
Discharge orders and prescription given to pt. Pt verbally acknowledged understanding. Pt voiced no questions when prompted. Pt to be transported by friend per private car. Pt to be transported out of unit per wheelchair by nurse tech.  Will monitor   Andrew AuVafiadis, Jebadiah Imperato I 05/21/2014 3:32 PM

## 2014-05-21 NOTE — Progress Notes (Signed)
Occupational Therapy Evaluation Patient Details Name: Ferne CoeGeorge L Rosiles Jr. MRN: [id number] DOB: 08-13-1970 Today's Date: 05/21/2014    History of Present Illness 43 y.o. male admitted with R side weakness concerning for CVA. Pt was doing the laundry on evening of 11/15 when his girlfriend noticed that he was not able to move his right side. In addition, he was complaining of HA. EMS was called and described him as unable to move the right side. [name]-(-). [name] brain (-).  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    Clinical Impression   Pt admitted with R side weakness. Pt currently with functional limitiations due to the deficits listed below (see OT problem list). Pt required directional verbal cues to ambulate and locate items during functional activities due to visual deficits in both eyes. Pt demonstrates weakness in RUE and RLE, but had no LOB during self-care tasks. Pt will benefit from skilled OT to increase their independence and safety with adls and balance to allow discharge home.     Follow Up Recommendations  No OT follow up;Supervision/Assistance - 24 hour    Equipment Recommendations  None recommended by OT    Recommendations for Other Services       Precautions / Restrictions Precautions Precautions: Fall Precaution Comments: blind in L eye and minimal vision R eye. Restrictions Weight Bearing Restrictions: No      Mobility Bed Mobility Overal bed mobility: Modified Independent Bed Mobility: Supine to Sit     Supine to sit: Modified independent (Device/Increase time)     General bed mobility comments: HOB flat, no use of bedrails to simulate home environment  Transfers Overall transfer level: Needs assistance Equipment used: Straight cane Transfers: Sit to/from Stand Sit to Stand: Min guard         General transfer comment: Min guard (A) for safety and  balance.     Balance Overall balance assessment: Needs assistance Sitting-balance support: No upper extremity supported;Feet supported Sitting balance-Leahy Scale: Good     Standing balance support: Single extremity supported;During functional activity Standing balance-Leahy Scale: Fair Standing balance comment: Able to stand to urinate with no LOB.                            ADL Overall ADL's : Needs assistance/impaired Eating/Feeding: Independent;Bed level Eating/Feeding Details (indicate cue type and reason): Pt eating breakfast independently on OT arrival Grooming: Wash/dry hands;Wash/dry face;Oral care;Set up;Standing Grooming Details (indicate cue type and reason): Pt having difficulty locating items on R side of sink due to visual deficits, min verbal cues provided.                  Toilet Transfer: Supervision/safety;Ambulation;Regular Toilet (Single point cane)   Toileting- Clothing Manipulation and Hygiene: Supervision/safety;Sit to/from stand       Functional mobility during ADLs: Supervision/safety;Cane General ADL Comments: Pt requires directional verbal cues to ambulate and to locate items in room due to visual deficits. Pt demonstrating mild weakness in RUE and RLE, but had no LOB during self-care tasks.      Vision Eye Alignment: Within Functional Limits   Ocular Range of Motion: Within Functional Limits           Additional Comments: Pt stated he lost most of his vision in R eye in July, but it returned after 2-3 days. Later on in July he lost most of R eye  vision again and it has not returned or worsened. Has had it checked by multiple eye MD, but no dx and unsure why it has happened since his R pupil is still reacting to light.   Perception     Praxis      Pertinent Vitals/Pain Pain Assessment: No/denies pain     Hand Dominance Right   Extremity/Trunk Assessment Upper Extremity Assessment Upper Extremity Assessment: RUE  deficits/detail;Generalized weakness RUE Deficits / Details: Grossly ~3/5 throughout RUE-premorbid from prior CVA   Lower Extremity Assessment Lower Extremity Assessment: Defer to PT evaluation   Cervical / Trunk Assessment Cervical / Trunk Assessment: Normal   Communication Communication Communication: No difficulties   Cognition Arousal/Alertness: Awake/alert Behavior During Therapy: WFL for tasks assessed/performed Overall Cognitive Status: Within Functional Limits for tasks assessed                     General Comments       Exercises       Shoulder Instructions      Home Living Family/patient expects to be discharged to:: Private residence Living Arrangements: Spouse/significant other;Other relatives (Girlfriend and 41 y.o. nephew)                 Bathroom Shower/Tub: Tub/shower unit;Curtain Shower/tub characteristics: Engineer, building servicesCurtain Bathroom Toilet: Standard                Prior Functioning/Environment Level of Independence: Independent with assistive device(s)        Comments: Independent with ambulation at home and [facility] and/or tapping stick for community ambulation. (I) with all ADLs and self care.      OT Diagnosis: Generalized weakness;Disturbance of vision;Hemiplegia dominant side   OT Problem List: Decreased strength;Decreased activity tolerance;Impaired balance (sitting and/or standing);Impaired vision/perception;Decreased knowledge of use of DME or AE;Impaired sensation   OT Treatment/Interventions: Self-care/ADL training;Therapeutic exercise;DME and/or AE instruction;Therapeutic activities;Visual/perceptual remediation/compensation;Patient/family education;Balance training    OT Goals(Current goals can be found in the care plan section) Acute Rehab OT Goals Patient Stated Goal: none stated OT Goal Formulation: With patient Time For Goal Achievement: 06/04/14 Potential to Achieve Goals: Good  OT Frequency: Min 2X/week   Barriers to  D/C:            Co-evaluation              End of Session Equipment Utilized During Treatment: Gait belt;Other (comment) (Single point cane) Nurse Communication: Mobility status;Precautions  Activity Tolerance: Patient tolerated treatment well Patient left: in chair;with call bell/phone within reach   Time: 0825-0850 OT Time Calculation (min): 25 min Charges:    G-Codes:    Nils PyleBermel, Whitley Patchen 05/21/2014, 9:19 AM

## 2014-05-25 ENCOUNTER — Emergency Department (HOSPITAL_COMMUNITY)
Admission: EM | Admit: 2014-05-25 | Discharge: 2014-05-25 | Disposition: A | Discharge status: Home / Self Care | Payer: Medicaid Other | Attending: Emergency Medicine | Admitting: Emergency Medicine

## 2014-05-25 ENCOUNTER — Emergency Department (HOSPITAL_COMMUNITY): Payer: Medicaid Other

## 2014-05-25 ENCOUNTER — Encounter (HOSPITAL_COMMUNITY): Payer: Self-pay | Admitting: Emergency Medicine

## 2014-05-25 DIAGNOSIS — Z880 Allergy status to penicillin: Secondary | ICD-10-CM | POA: Diagnosis not present

## 2014-05-25 DIAGNOSIS — E780 Pure hypercholesterolemia: Secondary | ICD-10-CM | POA: Diagnosis not present

## 2014-05-25 DIAGNOSIS — J45909 Unspecified asthma, uncomplicated: Secondary | ICD-10-CM | POA: Insufficient documentation

## 2014-05-25 DIAGNOSIS — F329 Major depressive disorder, single episode, unspecified: Secondary | ICD-10-CM | POA: Insufficient documentation

## 2014-05-25 DIAGNOSIS — Z79899 Other long term (current) drug therapy: Secondary | ICD-10-CM | POA: Diagnosis not present

## 2014-05-25 DIAGNOSIS — E119 Type 2 diabetes mellitus without complications: Secondary | ICD-10-CM | POA: Insufficient documentation

## 2014-05-25 DIAGNOSIS — F419 Anxiety disorder, unspecified: Secondary | ICD-10-CM | POA: Insufficient documentation

## 2014-05-25 DIAGNOSIS — Z7982 Long term (current) use of aspirin: Secondary | ICD-10-CM | POA: Insufficient documentation

## 2014-05-25 DIAGNOSIS — H540 Blindness, both eyes: Secondary | ICD-10-CM | POA: Insufficient documentation

## 2014-05-25 DIAGNOSIS — R079 Chest pain, unspecified: Secondary | ICD-10-CM | POA: Diagnosis not present

## 2014-05-25 DIAGNOSIS — Z720 Tobacco use: Secondary | ICD-10-CM | POA: Insufficient documentation

## 2014-05-25 DIAGNOSIS — G629 Polyneuropathy, unspecified: Secondary | ICD-10-CM | POA: Diagnosis not present

## 2014-05-25 DIAGNOSIS — Z8673 Personal history of transient ischemic attack (TIA), and cerebral infarction without residual deficits: Secondary | ICD-10-CM | POA: Insufficient documentation

## 2014-05-25 DIAGNOSIS — R55 Syncope and collapse: Secondary | ICD-10-CM | POA: Diagnosis not present

## 2014-05-25 LAB — I-STAT TROPONIN, ED
TROPONIN I, POC: 0 ng/mL (ref 0.00–0.08)
Troponin i, poc: 0 ng/mL (ref 0.00–0.08)

## 2014-05-25 LAB — CBC
HEMATOCRIT: 43.1 % (ref 39.0–52.0)
Hemoglobin: 15.2 g/dL (ref 13.0–17.0)
MCH: 31.5 pg (ref 26.0–34.0)
MCHC: 35.3 g/dL (ref 30.0–36.0)
MCV: 89.2 fL (ref 78.0–100.0)
Platelets: 151 10*3/uL (ref 150–400)
RBC: 4.83 MIL/uL (ref 4.22–5.81)
RDW: 12.6 % (ref 11.5–15.5)
WBC: 8.8 10*3/uL (ref 4.0–10.5)

## 2014-05-25 LAB — BASIC METABOLIC PANEL
ANION GAP: 16 — AB (ref 5–15)
BUN: 12 mg/dL (ref 6–23)
CHLORIDE: 100 meq/L (ref 96–112)
CO2: 23 mEq/L (ref 19–32)
Calcium: 9 mg/dL (ref 8.4–10.5)
Creatinine, Ser: 0.68 mg/dL (ref 0.50–1.35)
GFR calc Af Amer: >90 mL/min (ref >90)
Glucose, Bld: 130 mg/dL — ABNORMAL HIGH (ref 70–99)
POTASSIUM: 3.7 meq/L (ref 3.7–5.3)
Sodium: 139 mEq/L (ref 137–147)

## 2014-05-25 MED ORDER — IOHEXOL 300 MG/ML  SOLN: 100.0000 mL | Freq: Once | INTRAMUSCULAR | Status: DC | PRN

## 2014-05-25 MED ORDER — IOHEXOL 350 MG/ML SOLN
100.0000 mL | Freq: Once | INTRAVENOUS | Status: AC | PRN
Start: 2014-05-25 — End: 2014-05-25
  Administered 2014-05-25: 100 mL via INTRAVENOUS

## 2014-05-25 MED ORDER — NITROGLYCERIN 0.4 MG SL SUBL
0.4000 mg | SUBLINGUAL_TABLET | SUBLINGUAL | Status: AC | PRN
  Administered 2014-05-25 (×3): 0.4 mg via SUBLINGUAL
  Filled 2014-05-25 (×2): qty 1

## 2014-05-25 NOTE — Discharge Instructions (Signed)

## 2014-05-25 NOTE — ED Notes (Signed)
Per EMS: Seen here Sunday for stroke workup, negative. Sent home 2 days later, thought maybe TIA. Today 1 horu ago, wasn't feeling good, feeling really weak, had stubsternal non radiating cp with sob.  Got up to put shoes on, fell.  Does not remember mechanism of fall whether trip or passed out.  Remembers looking up on the floor.  Pain in right hip, no deformity, CMS intact.  Neuro negative, equal strength, sinus tach on monitor with no ST seg abnormalities seen per EMS. Cp intermittent with ems, 324 asa, 2 ntg, pain decreased from 7/10 to 3/10.  cbg 169, hx of diabetes.  Last BP 140-82, hr 110, 98% RA, 18 rr. 20 gauge right hand.

## 2014-05-25 NOTE — ED Notes (Signed)
CT called and informed pt is ready for CT.  

## 2014-05-25 NOTE — ED Notes (Signed)
Will hold nitroglycerin at this time r/t BP 99/61.

## 2014-05-25 NOTE — ED Notes (Signed)
Patient returned from X-ray 

## 2014-05-25 NOTE — ED Notes (Signed)
Dr. Walden at bedside 

## 2014-05-25 NOTE — ED Notes (Signed)
Phlebotomy at bedside.

## 2014-05-25 NOTE — ED Provider Notes (Signed)
CSN: [id number]     Arrival date & time 05/25/14  1814 History   First MD Initiated Contact with Patient 05/25/14 1815     Chief Complaint  Patient presents with  . Loss of Consciousness  . Chest Pain     (Consider location/radiation/quality/duration/timing/severity/associated sxs/prior Treatment) Patient is a 43 y.o. male presenting with syncope and chest pain. The history is provided by the patient.  Loss of Consciousness Associated symptoms: chest pain   Associated symptoms: no fever, no shortness of breath and no vomiting   Chest pain:    Quality:  Sharp   Severity:  Moderate   Onset quality:  Sudden   Timing:  Constant   Chronicity:  New Risk factors: vascular disease   Risk factors: no congenital heart disease and no coronary artery disease   Chest Pain Associated symptoms: syncope   Associated symptoms: no abdominal pain, no cough, no fever, no shortness of breath and not vomiting     Past Medical History  Diagnosis Date  . Diabetes mellitus without complication   . Neuropathy   . Asthma   . Vision loss of left eye   . Blindness     bilateral  . Hypercholesteremia   . Depression   . Anxiety   . Stroke 02/2014    right sided weakness/numbness   Past Surgical History  Procedure Laterality Date  . Hernia repair    . Eye surgery Left   . Abdominal surgery     Family History  Problem Relation Age of Onset  . High blood pressure Mother   . High blood pressure Sister   . High blood pressure Paternal Grandfather    History  Substance Use Topics  . Smoking status: Current Every Day Smoker -- 1.00 packs/day    Types: Cigarettes  . Smokeless tobacco: Never Used  . Alcohol Use: No     Comment: Ocassional drinker    Review of Systems  Constitutional: Negative for fever.  Respiratory: Negative for cough and shortness of breath.   Cardiovascular: Positive for chest pain and syncope.  Gastrointestinal: Negative for vomiting and abdominal pain.  All other  systems reviewed and are negative.     Allergies  Penicillins; Penicillins; Toradol; and Toradol  Home Medications   Prior to Admission medications   Medication Sig Start Date End Date Taking? Authorizing Provider  albuterol (PROVENTIL HFA;VENTOLIN HFA) 108 (90 BASE) MCG/ACT inhaler Inhale 2 puffs into the lungs every 6 (six) hours as needed for wheezing or shortness of breath.    Historical Provider, MD  aspirin 325 MG tablet Take 1 tablet (325 mg total) by mouth daily. 05/21/14   Joseph ArtJessica U Vann, DO  atorvastatin (LIPITOR) 40 MG tablet Take 40 mg by mouth every evening.     Historical Provider, MD  FLUoxetine (PROZAC) 20 MG capsule Take 20 mg by mouth daily.    Historical Provider, MD  glipiZIDE (GLUCOTROL XL) 10 MG 24 hr tablet Take 10 mg by mouth daily with breakfast.    Historical Provider, MD  ibuprofen (ADVIL,MOTRIN) 600 MG tablet Take 600 mg by mouth every 6 (six) hours as needed for moderate pain.     Historical Provider, MD  metFORMIN (GLUMETZA) 500 MG (MOD) 24 hr tablet Take 1,000 mg by mouth 2 (two) times daily.    Historical Provider, MD  Multiple Vitamins-Minerals (MULTIVITAMIN WITH MINERALS) tablet Take 1 tablet by mouth daily.    Historical Provider, MD  pregabalin (LYRICA) 150 MG capsule Take 150 mg by  mouth 2 (two) times daily.    Historical Provider, MD   BP 96/59 mmHg  Pulse 109  Temp(Src) 98.8 F (37.1 C) (Oral)  Resp 13  Ht 5\' 8"  (1.727 m)  Wt 190 lb (86.183 kg)  BMI 28.90 kg/m2  SpO2 93% Physical Exam  Constitutional: He is oriented to person, place, and time. He appears well-developed and well-nourished. No distress.  HENT:  Head: Normocephalic and atraumatic.  Mouth/Throat: Oropharynx is clear and moist. No oropharyngeal exudate.  Eyes: EOM are normal. Pupils are equal, round, and reactive to light.  Neck: Normal range of motion. Neck supple.  Cardiovascular: Normal rate and regular rhythm.  Exam reveals no friction rub.   No murmur  heard. Pulmonary/Chest: Effort normal and breath sounds normal. No respiratory distress. He has no wheezes. He has no rales.  Abdominal: Soft. He exhibits no distension. There is no tenderness. There is no rebound.  Musculoskeletal: Normal range of motion. He exhibits no edema.  Neurological: He is alert and oriented to person, place, and time. No cranial nerve deficit. He exhibits normal muscle tone. Coordination normal.  Skin: No rash noted. He is not diaphoretic.  Nursing note and vitals reviewed.   ED Course  Procedures (including critical care time) Labs Review Labs Reviewed  CBC  BASIC METABOLIC PANEL  I-STAT TROPOININ, ED    Imaging Review No results found.   EKG Interpretation   Date/Time:  Saturday May 25 2014 18:21:05 EST Ventricular Rate:  107 PR Interval:  118 QRS Duration: 74 QT Interval:  330 QTC Calculation: 440 R Axis:   83 Text Interpretation:  Sinus tachycardia Borderline T abnormalities,  inferior leads Similar to prior from 6 days ago Confirmed by Gwendolyn GrantWALDEN  MD,  Shaquanta Harkless [phone] on 05/25/2014 6:33:45 PM      MDM   Final diagnoses:  Syncope  Chest pain    43 year old male here with chest pain. Happen suddenly after standing up. Caused him to fall, but he did not lose consciousness. Sharp, central, nonradiating. Has never had this before. We'll loosely admitted for possible stroke workup with some right-sided weakness. He does have history of stroke in August of this year. He also is history of TIAs, diabetes, hyperlipidemia. Here exam benign. Neuro intact. No C-spine tenderness, c-collar removed. EKG normal. Will do workup for his chest pain.  CT Pe study negative. On re-exam, CP resolved. Will do serial troponins, which are negative. Stable for discharge, instructed to f/u with PCP.  Elwin MochaBlair Rhyleigh Grassel, MD 05/25/14 [phone]

## 2014-06-03 ENCOUNTER — Ambulatory Visit (INDEPENDENT_AMBULATORY_CARE_PROVIDER_SITE_OTHER): Payer: Medicaid Other | Admitting: Physician Assistant

## 2014-06-03 ENCOUNTER — Encounter: Payer: Self-pay | Admitting: Physician Assistant

## 2014-06-03 VITALS — BP 114/80 | HR 90 | Ht 68.0 in | Wt 197.1 lb

## 2014-06-03 DIAGNOSIS — I10 Essential (primary) hypertension: Secondary | ICD-10-CM

## 2014-06-03 DIAGNOSIS — Z720 Tobacco use: Secondary | ICD-10-CM

## 2014-06-03 DIAGNOSIS — I871 Compression of vein: Secondary | ICD-10-CM

## 2014-06-03 DIAGNOSIS — I951 Orthostatic hypotension: Secondary | ICD-10-CM

## 2014-06-03 DIAGNOSIS — R42 Dizziness and giddiness: Secondary | ICD-10-CM

## 2014-06-03 NOTE — Patient Instructions (Signed)
Your physician recommends that you continue on your current medications as directed. Please refer to the Current Medication list given to you today.   Your physician recommends that you schedule a follow-up appointment in:  DR [facility] IN 2 MONTHS     LIMIT INTAKE ON THESE ITEMS :  MOUNTAIN DEW   CAFFEINE IN COFFEE  INCREASE INTAKE ON THESE ITEMS:  WATER  BE CAREFUL WHEN QUICKLY GETTING UP FROM DIFFERENT POSITIONS

## 2014-06-03 NOTE — Assessment & Plan Note (Signed)
Smoking cessation discussed 

## 2014-06-03 NOTE — Progress Notes (Signed)
HPI: This is a 43 year old male patient who was seen in the hospital in consult by Dr. Patty SermonsBrackbill in July 2015 for syncope of undetermined etiology possible micturition syncope. He was having atypical chest pain as well and has multiple cardiac risk factors for CAD including premature coronary disease with a strong family history, diabetes mellitus, hypercholesterolemia and a long-time smoker. 2-D echo in July EF was 60-65% with grade 2 diastolic dysfunction, and may see with trivial MR. PA SP of 12 mmHg. He had a cardiac monitor 4 month in October that was normal. Stress Myoview 01/27/14 no definite inducible ischemia with pharmacologic stress EF 67%.  Patient has had multiple admissions with recurrent falls as well as a CVA in August 2015(SOCRATEs trial-aspirin vs ticagrelor). He says he gets dizzy if he stands up quickly. The last time he went to the emergency room 11/21/15he was bending over to tie shoes and started up quickly and fell over he never lost consciousness. MI was ruled out and no arrhythmias were documented. His lisinopril had been stopped because of hypotension. He was on this for his kidneys. CT angios the chest showed no evidence of significant pulmonary embolus. Another 2-D echo in November/16/15 normal systolic function EF 55-60% and states LV diastolic function parameters were normal. Severely dilated coronary sinus is consistent with persistent left superior vena cava syndrome which is really a significant clinical abnormality in less the patient requires cardiac rhythm device. If necessary this can be confirmed using a left antecubital vein saline contrast study.  Patient still has dizziness with change in position. He occasionally has sharp shooting chest pains. He says occasionally he'll have some tightness but he cannot specify when. He is not very active, continues to smoke a half a pack of cigarettes daily, drinks 312 ounce cans of Mountain Dew daily 4-5 cups of  coffee daily and 2-3 cups of tea daily. All of these are decaf. He does not drink much water.   Allergies  Allergen Reactions  . Penicillins Other (See Comments)    Childhood allergy  . Penicillins     unknown  . Toradol [Ketorolac Tromethamine] Hives  . Toradol [Ketorolac Tromethamine]     Hives      Current Outpatient Prescriptions  Medication Sig Dispense Refill  . albuterol (PROVENTIL HFA;VENTOLIN HFA) 108 (90 BASE) MCG/ACT inhaler Inhale 2 puffs into the lungs every 6 (six) hours as needed for wheezing or shortness of breath.    Marland Kitchen. aspirin 325 MG tablet Take 1 tablet (325 mg total) by mouth daily. 30 tablet 0  . atorvastatin (LIPITOR) 40 MG tablet Take 40 mg by mouth every evening.     Marland Kitchen. FLUoxetine (PROZAC) 20 MG capsule Take 20 mg by mouth daily.    Marland Kitchen. glipiZIDE (GLUCOTROL XL) 10 MG 24 hr tablet Take 10 mg by mouth daily with breakfast.    . ibuprofen (ADVIL,MOTRIN) 600 MG tablet Take 600 mg by mouth every 6 (six) hours as needed for moderate pain.     . metFORMIN (GLUMETZA) 500 MG (MOD) 24 hr tablet Take 1,000 mg by mouth 2 (two) times daily.    . Multiple Vitamins-Minerals (MULTIVITAMIN WITH MINERALS) tablet Take 1 tablet by mouth daily.    . pregabalin (LYRICA) 150 MG capsule Take 150 mg by mouth 2 (two) times daily.     No current facility-administered medications for this visit.    Past Medical History  Diagnosis Date  . Diabetes mellitus without complication   .  Neuropathy   . Asthma   . Vision loss of left eye   . Blindness     bilateral  . Hypercholesteremia   . Depression   . Anxiety   . Stroke 02/2014    right sided weakness/numbness    Past Surgical History  Procedure Laterality Date  . Hernia repair    . Eye surgery Left   . Abdominal surgery      Family History  Problem Relation Age of Onset  . High blood pressure Mother   . High blood pressure Sister   . High blood pressure Paternal Grandfather     History   Social History  . Marital  Status: Significant Other    Spouse Name: N/A    Number of Children: 2  . Years of Education: 9th   Occupational History  . disabled    Social History Main Topics  . Smoking status: Current Every Day Smoker -- 1.00 packs/day    Types: Cigarettes  . Smokeless tobacco: Never Used  . Alcohol Use: No     Comment: Ocassional drinker  . Drug Use: No  . Sexual Activity: Yes   Other Topics Concern  . Not on file   Social History Narrative   ** Merged History Encounter **   Patient  Lives with his girl friend   Patient is right handed   Patient coffee daily        ROS: Blind in one eye from his stroke, has trouble seeing  BP 114/80 mmHg  Pulse 90  Ht 5\' 8"  (1.727 m)  Wt 197 lb 1.9 oz (89.413 kg)  BMI 29.98 kg/m2 Please see orthostatic blood pressures  PHYSICAL EXAM: Obese, in no acute distress. Neck: No JVD, HJR, Bruit, or thyroid enlargement  Lungs: No tachypnea, clear without wheezing, rales, or rhonchi  Cardiovascular: RRR, PMI not displaced, Normal S1 and S2, no murmurs, gallops, bruit, thrill, or heave.  Abdomen: BS normal. Soft without organomegaly, masses, lesions or tenderness.  Extremities: without cyanosis, clubbing or edema. Good distal pulses bilateral  SKin: Warm, no lesions or rashes   Musculoskeletal: No deformities  Neuro: no focal signs   Wt Readings from Last 3 Encounters:  06/03/14 197 lb 1.9 oz (89.413 kg)  05/25/14 190 lb (86.183 kg)  05/19/14 190 lb (86.183 kg)    Lab Results  Component Value Date   WBC 8.8 05/25/2014   HGB 15.2 05/25/2014   HCT 43.1 05/25/2014   PLT 151 05/25/2014   GLUCOSE 130* 05/25/2014   CHOL 100 05/20/2014   TRIG 70 05/20/2014   HDL 31* 05/20/2014   LDLCALC 55 05/20/2014   ALT 25 05/19/2014   AST 18 05/19/2014   NA 139 05/25/2014   K 3.7 05/25/2014   CL 100 05/25/2014   CREATININE 0.68 05/25/2014   BUN 12 05/25/2014   CO2 23 05/25/2014   TSH 1.620 01/27/2014   INR 1.04 05/19/2014   HGBA1C 7.5*  05/20/2014    EKG: Normal sinus rhythm with nonspecific ST-T wave changes   FINDINGS: SPECT: Allowing for similar apical and inferior wall attenuation artifact on both the stress and rest views, there is no definite inducible or reversible ischemia with pharmacologic stress.   Wall motion: Grossly normal wall motion and thickening   Ejection fraction:  Calculated ejection fraction is 67%.   IMPRESSION: No definite inducible ischemia with pharmacologic stress.   67% ejection fraction     Electronically Signed   By: Sharen Counterrevor  Shick M.D.  On: 01/27/2014 12:15  2-D echo 05/20/14 Study Conclusions  - Left ventricle: The cavity size was normal. Systolic function was   normal. The estimated ejection fraction was in the range of 55%   to 60%. Wall motion was normal; there were no regional wall   motion abnormalities. Left ventricular diastolic function   parameters were normal.  Recommendations:  Severely dilated coronary sinus is consistent with persistent left superior vena cava syndrome (rarely a clinically significant abnormality unless the paient requires cardiac rhythm device). If necessary, this can be confiirmed using a left antecubital vein saline contrast study.  2-D echo 01/2014 Study Conclusions  - Left ventricle: The cavity size was normal. Wall thickness was at   the upper limits of normal. Systolic function was normal. The   estimated ejection fraction was in the range of 60% to 65%. Wall   motion was normal; there were no regional wall motion   abnormalities. Features are consistent with a pseudonormal left   ventricular filling pattern, with concomitant abnormal relaxation   and increased filling pressure (grade 2 diastolic dysfunction). - Aortic valve: Mildly calcified annulus. Trileaflet. There was no   significant regurgitation. - Mitral valve: Calcified annulus. There was trivial regurgitation. - Right atrium: Central venous pressure (est): 3 mm Hg. -  Atrial septum: No defect or patent foramen ovale was identified. - Tricuspid valve: There was trivial regurgitation. - Pulmonary arteries: PA peak pressure: 12 mm Hg (S). - Pericardium, extracardiac: A prominent pericardial fat pad was   present.  Impressions:  - Upper normal wall thickness with LVEF 60-65%, grade 2 diastolic   dysfunction. MAC with trivial mitral regurgitation. PASP 12 mmHg.

## 2014-06-03 NOTE — Assessment & Plan Note (Signed)
Patient actually has orthostatic hypotension

## 2014-06-03 NOTE — Assessment & Plan Note (Signed)
Patient has had multiple emergency room visits for what he describes as falls. He gets up quickly and falls over. He usually does not lose consciousness. He has had an extensive cardiac workup including 2-D echoes 2, 30 day event recorder, Lexi scan Myoview. Patient continues to [facility], coffee and tea all day long. He is orthostatic in the office today. He does not stay well hydrated. Recommend cutting back on these drinks and increasing his water intake. Have instructed him on getting up slowly and carefully. Follow-up with Dr. Patty SermonsBrackbill in 2 months.

## 2014-06-07 ENCOUNTER — Ambulatory Visit: Payer: Medicaid Other

## 2014-06-21 ENCOUNTER — Ambulatory Visit: Payer: Medicaid Other | Attending: Internal Medicine

## 2014-06-21 DIAGNOSIS — R41841 Cognitive communication deficit: Secondary | ICD-10-CM | POA: Diagnosis not present

## 2014-06-21 NOTE — Therapy (Addendum)
[facility] [address] [place], [place], [address] Phone: [phone]   Fax:  [phone]  Speech Language Pathology Evaluation  Patient Details  Name: Miguel CoeGeorge L Koontz Jr. MRN: [id number] Date of Birth: May 24, 1971  Encounter Date: 06/21/2014    Past Medical History  Diagnosis Date  . Diabetes mellitus without complication   . Neuropathy   . Asthma   . Vision loss of left eye   . Blindness     bilateral  . Hypercholesteremia   . Depression   . Anxiety   . Stroke 02/2014    right sided weakness/numbness    Past Surgical History  Procedure Laterality Date  . Hernia repair    . Eye surgery Left   . Abdominal surgery      There were no vitals taken for this visit.  Visit Diagnosis: Cognitive communication deficit - Plan: SLP plan of care cert/re-cert   O: Pt presents with mild-moderate memory deficits following CVA at junction of pons and midbrain dorsally on rt. PMH: partial blindness in lt eye 2013 and in rt eye July 2015. Pt was discharged from hospital after CVA on 02-13-14.  Pt was administered the story subtest from the [facility] and recalled 4/21 items immediately when he was not compensating his memory skills by writing. When compensating by writing, he recalled items from a different paragraph 8.5/21 items. After 7 minutes, he recalled 4/21 from the first paragraph and 8/21 items from the second. SLP strongly encouraged pt to compensate for his reduced memory skills by writing information down. The pt was administered the [employer] Verbal Learning Test as well, and scored WNL on Recall section for 1/3 repetitions of 21 random words. In the recognition section (recognizing the 12 words in a long word string of foils as well as 12 traget words) pt scored 10/12, where scores lower than 11.55 are considered below the normal range. This suggests the patient's memory deficit lies in encoding  information rather than in recalling of information. SLP then spent approx 10 minutes with pt and girlfriend, Miguel QuinLinda, educating what forms of compensation devices/means may benefit pt. (See "education")            SLP Short Term Goals - 06/21/14 1455    SLP SHORT TERM GOAL #1   Title pt will obtain a low tech memory device and set it up for use during the week   Baseline no device yet   Time 2   Period Weeks   Status New   SLP SHORT TERM GOAL #2   Title pt will verbally provide appropriate responses for situations/circumstances requiring use of memory device   Baseline pt does not yet have memory device   Time 2   Period Weeks   Status New          SLP Long Term Goals - 06/21/14 1457    SLP LONG TERM GOAL #1   Title pt will report use of memory device successfully during the week x3   Baseline pt does not yet have memory device   Time 6   Period Weeks   Status New   SLP LONG TERM GOAL #2   Title pt will report use of his to-do list successfully during the week x2   Baseline pt does not currently use a to-do list   Time 6   Period Weeks   Status New          Problem List Patient Active Problem  List   Diagnosis Date Noted  . Orthostatic hypotension 06/03/2014  . 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 Cerebral thrombosis with cerebral infarction 05/20/2014  . Right sided weakness 05/19/2014  . Stroke   . DM type 2 with diabetic peripheral neuropathy 04/09/2014  . Cerebral infarction due to thrombosis of cerebral artery 03/27/2014  . Essential hypertension, benign 03/27/2014  . HLD (hyperlipidemia) 03/27/2014  . Type II or unspecified type diabetes mellitus with peripheral circulatory disorders, uncontrolled(250.72) 02/12/2014  . Tobacco abuse 02/12/2014  . CVA (cerebral infarction) 02/11/2014  . Syncope 01/25/2014  . Hip pain 01/25/2014  . SAH (subarachnoid hemorrhage) 01/25/2014  . Vision, loss, sudden 01/25/2014  . Type II or unspecified type  diabetes mellitus without mention of complication, uncontrolled 01/25/2014    [facility] [place]-ErCHINKE,Diedra Sinor 07/22/2014, 11:33 AM  [facility] [address] [place], [place], [address] Phone: [phone]   Fax:  [phone]

## 2014-06-21 NOTE — Patient Instructions (Signed)
Begin to use a notebook to remember events and to write down events that are coming up  Use a to-do list on the frig to remember to do chores/tasks

## 2014-07-05 ENCOUNTER — Emergency Department (HOSPITAL_COMMUNITY): Payer: Medicaid Other

## 2014-07-05 ENCOUNTER — Emergency Department (HOSPITAL_COMMUNITY)
Admission: EM | Admit: 2014-07-05 | Discharge: 2014-07-06 | Disposition: A | Discharge status: Home / Self Care | Payer: Medicaid Other | Attending: Emergency Medicine | Admitting: Emergency Medicine

## 2014-07-05 ENCOUNTER — Encounter (HOSPITAL_COMMUNITY): Payer: Self-pay

## 2014-07-05 DIAGNOSIS — R41841 Cognitive communication deficit: Secondary | ICD-10-CM

## 2014-07-05 DIAGNOSIS — Z8673 Personal history of transient ischemic attack (TIA), and cerebral infarction without residual deficits: Secondary | ICD-10-CM | POA: Insufficient documentation

## 2014-07-05 DIAGNOSIS — E119 Type 2 diabetes mellitus without complications: Secondary | ICD-10-CM | POA: Diagnosis not present

## 2014-07-05 DIAGNOSIS — H540 Blindness, both eyes: Secondary | ICD-10-CM | POA: Diagnosis not present

## 2014-07-05 DIAGNOSIS — E780 Pure hypercholesterolemia: Secondary | ICD-10-CM | POA: Insufficient documentation

## 2014-07-05 DIAGNOSIS — Z720 Tobacco use: Secondary | ICD-10-CM | POA: Insufficient documentation

## 2014-07-05 DIAGNOSIS — Z79899 Other long term (current) drug therapy: Secondary | ICD-10-CM | POA: Diagnosis not present

## 2014-07-05 DIAGNOSIS — Z7982 Long term (current) use of aspirin: Secondary | ICD-10-CM | POA: Diagnosis not present

## 2014-07-05 DIAGNOSIS — Z880 Allergy status to penicillin: Secondary | ICD-10-CM | POA: Insufficient documentation

## 2014-07-05 DIAGNOSIS — R51 Headache: Secondary | ICD-10-CM | POA: Diagnosis not present

## 2014-07-05 DIAGNOSIS — R531 Weakness: Secondary | ICD-10-CM | POA: Diagnosis present

## 2014-07-05 DIAGNOSIS — R410 Disorientation, unspecified: Secondary | ICD-10-CM | POA: Diagnosis not present

## 2014-07-05 DIAGNOSIS — J45909 Unspecified asthma, uncomplicated: Secondary | ICD-10-CM | POA: Diagnosis not present

## 2014-07-05 DIAGNOSIS — R519 Headache, unspecified: Secondary | ICD-10-CM

## 2014-07-05 LAB — CBG MONITORING, ED: GLUCOSE-CAPILLARY: 212 mg/dL — AB (ref 70–99)

## 2014-07-05 NOTE — ED Provider Notes (Signed)
CSN: [id number]     Arrival date & time 07/05/14  2235 History   First MD Initiated Contact with Patient 07/05/14 2236     Chief Complaint  Patient presents with  . Weakness     (Consider location/radiation/quality/duration/timing/severity/associated sxs/prior Treatment) HPI   Miguel Diaz. is a 44 y.o. male   senna to the ED, by EMS, with report of acute onset of headache and right-sided weakness, at 6:30 PM tonight.  He has a history of CVA.  Complains of a heavy sensation in his right arm.  His headache is left-sided.   Level V Caveat- Unable to give clear history     Past Medical History  Diagnosis Date  . Diabetes mellitus without complication   . Neuropathy   . Asthma   . Vision loss of left eye   . Blindness     bilateral  . Hypercholesteremia   . Depression   . Anxiety   . Stroke 02/2014    right sided weakness/numbness   Past Surgical History  Procedure Laterality Date  . Hernia repair    . Eye surgery Left   . Abdominal surgery     Family History  Problem Relation Age of Onset  . High blood pressure Mother   . High blood pressure Sister   . High blood pressure Paternal Grandfather    History  Substance Use Topics  . Smoking status: Current Every Day Smoker -- 1.00 packs/day    Types: Cigarettes  . Smokeless tobacco: Never Used  . Alcohol Use: No     Comment: Ocassional drinker    Review of Systems  All other systems reviewed and are negative.     Allergies  Penicillins; Penicillins; Toradol; and Toradol  Home Medications   Prior to Admission medications   Medication Sig Start Date End Date Taking? Authorizing Provider  albuterol (PROVENTIL HFA;VENTOLIN HFA) 108 (90 BASE) MCG/ACT inhaler Inhale 2 puffs into the lungs every 6 (six) hours as needed for wheezing or shortness of breath.   Yes Historical Provider, MD  aspirin 325 MG tablet Take 1 tablet (325 mg total) by mouth daily. 05/21/14  Yes Joseph Art, DO  atorvastatin  (LIPITOR) 40 MG tablet Take 40 mg by mouth every evening.    Yes Historical Provider, MD  FLUoxetine (PROZAC) 40 MG capsule Take 40 mg by mouth daily.   Yes Historical Provider, MD  glipiZIDE (GLUCOTROL XL) 10 MG 24 hr tablet Take 10 mg by mouth daily with breakfast.   Yes Historical Provider, MD  metFORMIN (GLUMETZA) 500 MG (MOD) 24 hr tablet Take 1,000 mg by mouth 2 (two) times daily with a meal.    Yes Historical Provider, MD  Multiple Vitamins-Minerals (MULTIVITAMIN WITH MINERALS) tablet Take 1 tablet by mouth daily.   Yes Historical Provider, MD  pregabalin (LYRICA) 150 MG capsule Take 150 mg by mouth 2 (two) times daily.   Yes Historical Provider, MD  FLUoxetine (PROZAC) 20 MG capsule Take 40 mg by mouth daily.     Historical Provider, MD  ibuprofen (ADVIL,MOTRIN) 600 MG tablet Take 600 mg by mouth every 6 (six) hours as needed for moderate pain.     Historical Provider, MD   BP 124/87 mmHg  Pulse 80  Resp 17  SpO2 96% Physical Exam  Constitutional: He is oriented to person, place, and time. He appears well-developed and well-nourished. No distress.  HENT:  Head: Normocephalic and atraumatic.  Right Ear: External ear normal.  Left Ear: External  ear normal.  Eyes: Conjunctivae and EOM are normal. Pupils are equal, round, and reactive to light.  Neck: Normal range of motion and phonation normal. Neck supple.  Cardiovascular: Normal rate, regular rhythm and normal heart sounds.   Pulmonary/Chest: Effort normal and breath sounds normal. He exhibits no bony tenderness.  Abdominal: Soft. There is no tenderness.  Musculoskeletal: Normal range of motion.  No asymmetry of motor strength  Neurological: He is alert and oriented to person, place, and time. No sensory deficit. He exhibits normal muscle tone. Coordination normal.  No dysarthria and aphasia or nystagmus.  He is able to move eyes in all quadrants.  He has difficulty focusing on any particular thing.  He has blindness in both eyes.   No facial asymmetry.  Skin: Skin is warm, dry and intact.  Psychiatric: He has a normal mood and affect. His behavior is normal. Judgment and thought content normal.  Nursing note and vitals reviewed.   ED Course  Procedures (including critical care time)  22:43- consideration for thrombolysis on arrival - presentation as code stroke, by EMS, he is alert, lucid, and does not have focal weakness, concerning for large vessel occlusion or acute intracranial bleeding.  Therefore, TPA will not be ordered acutely.  He complains of left-sided headache, and right-sided heaviness, onset at 6:30 PM tonight.  He is able to speak, he does not have receptive aphasia, he is able to hold both arms above his head and maintain them in that position.  There is no lower extremity weakness.  Medications  sodium chloride 0.9 % bolus 1,000 mL (0 mLs Intravenous Stopped 07/06/14 0335)    Patient Vitals for the past 24 hrs:  BP Pulse Resp SpO2  07/06/14 0330 124/87 mmHg 80 17 96 %  07/06/14 0315 114/87 mmHg 85 17 96 %  07/06/14 0300 119/87 mmHg 86 16 97 %  07/06/14 0245 (!) 132/106 mmHg 81 14 96 %  07/06/14 0230 123/81 mmHg 80 13 96 %  07/06/14 0215 (!) 120/101 mmHg 83 16 96 %  07/06/14 0200 137/83 mmHg 83 14 96 %  07/06/14 0145 113/90 mmHg 79 16 97 %  07/06/14 0130 124/87 mmHg 94 15 95 %  07/06/14 0115 124/85 mmHg 89 23 96 %  07/06/14 0100 131/90 mmHg 94 18 95 %  07/06/14 0045 136/88 mmHg 86 18 96 %  07/06/14 0030 136/90 mmHg 93 21 96 %  07/06/14 0000 135/86 mmHg 88 19 94 %  07/05/14 2345 135/96 mmHg 86 20 95 %  07/05/14 2330 137/90 mmHg 87 17 95 %  07/05/14 2300 136/94 mmHg 94 16 97 %  07/05/14 2237 - - - 94 %    00:15- Reevaluation with update and discussion. After initial assessment and treatment, an updated evaluation reveals family members have arrived, they report a different history, and initially obtained when he was here alone.  His girlfriend was with him today when they were out and about  doing things.  At 7:30, they left, another family member's home to go to their home.  She noticed that he was confused about where they were going, and also about things in the house such as calling one of their dogs an incorrect name.  His girlfriend checked his blood sugar, and it was high at 400.  He has not had any recent illnesses.  He has been eating well.  He is apparently taking his usual medications.  His girlfriend is concerned that he has had several TIAs since his stroke  several months ago.  Additional physical examination is performed at this time.  The patient is able sit up on the stretcher on his own, and maintain that position, without falling.  There is no ataxia.  He is able to independently hold his arms, metastatic elevated position, bilaterally.  The same applies to his legs bilaterally. Miguel Diaz     Labs Review Labs Reviewed  BASIC METABOLIC PANEL - Abnormal; Notable for the following:    Glucose, Bld 201 (*)    All other components within normal limits  CBC WITH DIFFERENTIAL - Abnormal; Notable for the following:    WBC 13.7 (*)    Platelets 126 (*)    Neutro Abs 10.0 (*)    All other components within normal limits  URINALYSIS, ROUTINE W REFLEX MICROSCOPIC - Abnormal; Notable for the following:    Glucose, UA 500 (*)    All other components within normal limits  CBG MONITORING, ED - Abnormal; Notable for the following:    Glucose-Capillary 212 (*)    All other components within normal limits  URINE CULTURE    Imaging Review Ct Head (brain) Wo Contrast  07/05/2014   CLINICAL DATA:  Acute onset of posterior headache and dizziness. Initial encounter.  EXAM: CT HEAD WITHOUT CONTRAST  TECHNIQUE: Contiguous axial images were obtained from the base of the skull through the vertex without intravenous contrast.  COMPARISON:  CT of the head performed 05/19/2014, and [name]/MRA of the brain performed 05/20/2014  FINDINGS: There is no evidence of acute infarction, mass  lesion, or intra- or extra-axial hemorrhage on CT.  There is stable mild prominence of the occipital horns of the lateral ventricles, thought to reflect a normal variant. Mild subcortical white matter change likely reflects small vessel ischemic microangiopathy.  The posterior fossa, including the cerebellum, brainstem and fourth ventricle, is within normal limits. The third ventricle and basal ganglia are unremarkable in appearance. The cerebral hemispheres are symmetric in appearance, with normal gray-white differentiation. No mass effect or midline shift is seen.  There is no evidence of fracture. The orbits are within normal limits. A mucus retention cyst or polyp is noted at the left maxillary sinus. The remaining paranasal sinuses and mastoid air cells are well-aerated. Bilateral soft tissue lipomas are seen overlying the parietal calvarium, with slight bony remodeling noted on the left. Lipomas measure 1.8 cm on the right and 0.9 cm on the left.  IMPRESSION: 1. No acute intracranial pathology seen on CT. 2. Stable prominence of the occipital horns of the lateral ventricles is thought to reflect a normal variant. 3. Mild small vessel ischemic microangiopathy. 4. Mucus retention cyst or polyp at the left maxillary sinus. 5. Bilateral soft tissue lipomas noted overlying the parietal calvarium, with slight bony remodeling noted on the left. Lipomas measure 1.8 cm on the right and 0.9 cm on the left.   Electronically Signed   By: Roanna Raider M.D.   On: 07/05/2014 23:37     EKG Interpretation   Date/Time:  Friday July 05 2014 22:53:50 EST Ventricular Rate:  93 PR Interval:  120 QRS Duration: 79 QT Interval:  356 QTC Calculation: 443 R Axis:   30 Text Interpretation:  Sinus rhythm Borderline T abnormalities, diffuse  leads Baseline wander in lead(s) V2 since last tracing no significant  change Confirmed by Effie Shy  MD, Itay Mella [phone]) on 07/05/2014 11:12:11 PM      MDM   Final diagnoses:   Left-sided headache  Cognitive communication deficit  Confusion  Nonspecific sx, with hx severe cognitive dysfunction, being managed as OP with rehabilitation. No good evidence for acute CVA, metabolic instability, or SBI.  Nursing Notes Reviewed/ Care Coordinated Applicable Imaging Reviewed Interpretation of Laboratory Data incorporated into ED treatment  Care to Dr. Norlene Campbell to check labs, and d/c if remains stable.    Flint Melter, MD 07/06/14 1220

## 2014-07-05 NOTE — ED Notes (Signed)
Pt brought by EMS for right sided weakness and headache.  Pt has positive facial droop.  CT called

## 2014-07-05 NOTE — ED Notes (Signed)
Wentz MD at bedside. 

## 2014-07-06 LAB — URINALYSIS, ROUTINE W REFLEX MICROSCOPIC
Bilirubin Urine: NEGATIVE
Glucose, UA: 500 mg/dL — AB
HGB URINE DIPSTICK: NEGATIVE
KETONES UR: NEGATIVE mg/dL
Leukocytes, UA: NEGATIVE
Nitrite: NEGATIVE
PROTEIN: NEGATIVE mg/dL
Specific Gravity, Urine: 1.014 (ref 1.005–1.030)
UROBILINOGEN UA: 0.2 mg/dL (ref 0.0–1.0)
pH: 6 (ref 5.0–8.0)

## 2014-07-06 LAB — BASIC METABOLIC PANEL
ANION GAP: 11 (ref 5–15)
BUN: 10 mg/dL (ref 6–23)
CHLORIDE: 103 meq/L (ref 96–112)
CO2: 24 mmol/L (ref 19–32)
CREATININE: 0.62 mg/dL (ref 0.50–1.35)
Calcium: 9.3 mg/dL (ref 8.4–10.5)
GFR calc non Af Amer: >90 mL/min (ref >90)
Glucose, Bld: 201 mg/dL — ABNORMAL HIGH (ref 70–99)
Potassium: 3.6 mmol/L (ref 3.5–5.1)
Sodium: 138 mmol/L (ref 135–145)

## 2014-07-06 LAB — CBC WITH DIFFERENTIAL/PLATELET
BASOS ABS: 0 10*3/uL (ref 0.0–0.1)
BASOS PCT: 0 % (ref 0–1)
EOS ABS: 0.3 10*3/uL (ref 0.0–0.7)
Eosinophils Relative: 2 % (ref 0–5)
HCT: 46.2 % (ref 39.0–52.0)
HEMOGLOBIN: 16.3 g/dL (ref 13.0–17.0)
Lymphocytes Relative: 19 % (ref 12–46)
Lymphs Abs: 2.6 10*3/uL (ref 0.7–4.0)
MCH: 32.1 pg (ref 26.0–34.0)
MCHC: 35.3 g/dL (ref 30.0–36.0)
MCV: 91.1 fL (ref 78.0–100.0)
MONO ABS: 0.8 10*3/uL (ref 0.1–1.0)
Monocytes Relative: 6 % (ref 3–12)
NEUTROS PCT: 73 % (ref 43–77)
Neutro Abs: 10 10*3/uL — ABNORMAL HIGH (ref 1.7–7.7)
Platelets: 126 10*3/uL — ABNORMAL LOW (ref 150–400)
RBC: 5.07 MIL/uL (ref 4.22–5.81)
RDW: 12.7 % (ref 11.5–15.5)
WBC: 13.7 10*3/uL — ABNORMAL HIGH (ref 4.0–10.5)

## 2014-07-06 MED ORDER — SODIUM CHLORIDE 0.9 % IV BOLUS (SEPSIS)
1000.0000 mL | Freq: Once | INTRAVENOUS | Status: AC
  Administered 2014-07-06: 1000 mL via INTRAVENOUS

## 2014-07-06 NOTE — ED Notes (Signed)
MD at bedside. 

## 2014-07-06 NOTE — Discharge Instructions (Signed)
General Headache Without Cause A headache is pain or discomfort felt around the head or neck area. The specific cause of a headache may not be found. There are many causes and types of headaches. A few common ones are:  Tension headaches.  Migraine headaches.  Cluster headaches.  Chronic daily headaches. HOME CARE INSTRUCTIONS   Keep all follow-up appointments with your caregiver or any specialist referral.  Only take over-the-counter or prescription medicines for pain or discomfort as directed by your caregiver.  Lie down in a dark, quiet room when you have a headache.  Keep a headache journal to find out what may trigger your migraine headaches. For example, write down:  What you eat and drink.  How much sleep you get.  Any change to your diet or medicines.  Try massage or other relaxation techniques.  Put ice packs or heat on the head and neck. Use these 3 to 4 times per day for 15 to 20 minutes each time, or as needed.  Limit stress.  Sit up straight, and do not tense your muscles.  Quit smoking if you smoke.  Limit alcohol use.  Decrease the amount of caffeine you drink, or stop drinking caffeine.  Eat and sleep on a regular schedule.  Get 7 to 9 hours of sleep, or as recommended by your caregiver.  Keep lights dim if bright lights bother you and make your headaches worse. SEEK MEDICAL CARE IF:   You have problems with the medicines you were prescribed.  Your medicines are not working.  You have a change from the usual headache.  You have nausea or vomiting. SEEK IMMEDIATE MEDICAL CARE IF:   Your headache becomes severe.  You have a fever.  You have a stiff neck.  You have loss of vision.  You have muscular weakness or loss of muscle control.  You start losing your balance or have trouble walking.  You feel faint or pass out.  You have severe symptoms that are different from your first symptoms. MAKE SURE YOU:   Understand these  instructions.  Will watch your condition.  Will get help right away if you are not doing well or get worse. Document Released: 06/21/2005 Document Revised: 09/13/2011 Document Reviewed: 07/07/2011 [facility] Patient Information 2015 [place], [place]. This information is not intended to replace advice given to you by your health care provider. Make sure you discuss any questions you have with your health care provider.  Confusion Confusion is the inability to think with your usual speed or clarity. Confusion may come on quickly or slowly over time. How quickly the confusion comes on depends on the cause. Confusion can be due to any number of causes. CAUSES   Concussion, head injury, or head trauma.  Seizures.  Stroke.  Fever.  Brain tumor.  Age related decreased brain function (dementia).  Heightened emotional states like rage or terror.  Mental illness in which the person loses the ability to determine what is real and what is not (hallucinations).  Infections such as a urinary tract infection (UTI).  Toxic effects from alcohol, drugs, or prescription medicines.  Dehydration and an imbalance of salts in the body (electrolytes).  Lack of sleep.  Low blood sugar (diabetes).  Low levels of oxygen from conditions such as chronic lung disorders.  Drug interactions or other medicine side effects.  Nutritional deficiencies, especially niacin, thiamine, vitamin C, or vitamin B.  Sudden drop in body temperature (hypothermia).  Change in routine, such as when traveling or hospitalized.  SIGNS AND SYMPTOMS  People often describe their thinking as cloudy or unclear when they are confused. Confusion can also include feeling disoriented. That means you are unaware of where or who you are. You may also not know what the date or time is. If confused, you may also have difficulty paying attention, remembering, and making decisions. Some people also act aggressively when they are confused.    DIAGNOSIS  The medical evaluation of confusion may include:  Blood and urine tests.  X-rays.  Brain and nervous system tests.  Analyzing your brain waves (electroencephalogram or EEG).  Magnetic resonance imaging ([name]) of your head.  Computed tomography (CT) scan of your head.  Mental status tests in which your health care provider may ask many questions. Some of these questions may seem silly or strange, but they are a very important test to help diagnose and treat confusion. TREATMENT  An admission to the hospital may not be needed, but a person with confusion should not be left alone. Stay with a family member or friend until the confusion clears. Avoid alcohol, pain relievers, or sedative drugs until you have fully recovered. Do not drive until directed by your health care provider. HOME CARE INSTRUCTIONS  What family and friends can do:  To find out if someone is confused, ask the person to state his or her name, age, and the date. If the person is unsure or answers incorrectly, he or she is confused.  Always introduce yourself, no matter how well the person knows you.  Often remind the person of his or her location.  Place a calendar and clock near the confused person.  Help the person with his or her medicines. You may want to use a pill box, an alarm as a reminder, or give the person each dose as prescribed.  Talk about current events and plans for the day.  Try to keep the environment calm, quiet, and peaceful.  Make sure the person keeps follow-up visits with his or her health care provider. PREVENTION  Ways to prevent confusion:  Avoid alcohol.  Eat a balanced diet.  Get enough sleep.  Take medicine only as directed by your health care provider.  Do not become isolated. Spend time with other people and make plans for your days.  Keep careful watch on your blood sugar levels if you are diabetic. SEEK IMMEDIATE MEDICAL CARE IF:   You develop severe  headaches, repeated vomiting, seizures, blackouts, or slurred speech.  There is increasing confusion, weakness, numbness, restlessness, or personality changes.  You develop a loss of balance, have marked dizziness, feel uncoordinated, or fall.  You have delusions, hallucinations, or develop severe anxiety.  Your family members think you need to be rechecked. Document Released: 07/29/2004 Document Revised: 11/05/2013 Document Reviewed: 07/27/2013 [facility] Patient Information 2015 [place], [place]. This information is not intended to replace advice given to you by your health care provider. Make sure you discuss any questions you have with your health care provider.

## 2014-07-06 NOTE — ED Provider Notes (Signed)
Care assumed from Dr Effie Shy at change of shift.  Pt with h/o CVA, presented with left headache, right side heavy.  Prior h/o stroke.  [name] negative.  Family reports confusion, elevated blood sugar.  Labs pending.  Pt has h/o confusion, seen recently outpatient rehab, noted problems with memory.  Has neurologist with [facility].  If labs unremarkable, stable for d/c home.  Results for orders placed or performed during the hospital encounter of 07/05/14  Basic metabolic panel  Result Value Ref Range   Sodium 138 135 - 145 mmol/L   Potassium 3.6 3.5 - 5.1 mmol/L   Chloride 103 96 - 112 mEq/L   CO2 24 19 - 32 mmol/L   Glucose, Bld 201 (H) 70 - 99 mg/dL   BUN 10 6 - 23 mg/dL   Creatinine, Ser 6.44 0.50 - 1.35 mg/dL   Calcium 9.3 8.4 - 03.4 mg/dL   GFR calc non Af Amer >90 >90 mL/min   GFR calc Af Amer >90 >90 mL/min   Anion gap 11 5 - 15  CBC with Differential  Result Value Ref Range   WBC 13.7 (H) 4.0 - 10.5 K/uL   RBC 5.07 4.22 - 5.81 MIL/uL   Hemoglobin 16.3 13.0 - 17.0 g/dL   HCT 74.2 59.5 - 63.8 %   MCV 91.1 78.0 - 100.0 fL   MCH 32.1 26.0 - 34.0 pg   MCHC 35.3 30.0 - 36.0 g/dL   RDW 75.6 43.3 - 29.5 %   Platelets 126 (L) 150 - 400 K/uL   Neutrophils Relative % 73 43 - 77 %   Lymphocytes Relative 19 12 - 46 %   Monocytes Relative 6 3 - 12 %   Eosinophils Relative 2 0 - 5 %   Basophils Relative 0 0 - 1 %   Neutro Abs 10.0 (H) 1.7 - 7.7 K/uL   Lymphs Abs 2.6 0.7 - 4.0 K/uL   Monocytes Absolute 0.8 0.1 - 1.0 K/uL   Eosinophils Absolute 0.3 0.0 - 0.7 K/uL   Basophils Absolute 0.0 0.0 - 0.1 K/uL   WBC Morphology TOXIC GRANULATION   Urinalysis, Routine w reflex microscopic  Result Value Ref Range   Color, Urine YELLOW YELLOW   APPearance CLEAR CLEAR   Specific Gravity, Urine 1.014 1.005 - 1.030   pH 6.0 5.0 - 8.0   Glucose, UA 500 (A) NEGATIVE mg/dL   Hgb urine dipstick NEGATIVE NEGATIVE   Bilirubin Urine NEGATIVE NEGATIVE   Ketones, ur NEGATIVE NEGATIVE mg/dL   Protein, ur  NEGATIVE NEGATIVE mg/dL   Urobilinogen, UA 0.2 0.0 - 1.0 mg/dL   Nitrite NEGATIVE NEGATIVE   Leukocytes, UA NEGATIVE NEGATIVE  CBG monitoring, ED  Result Value Ref Range   Glucose-Capillary 212 (H) 70 - 99 mg/dL   Ct Head (brain) Wo Contrast  07/05/2014   CLINICAL DATA:  Acute onset of posterior headache and dizziness. Initial encounter.  EXAM: CT HEAD WITHOUT CONTRAST  TECHNIQUE: Contiguous axial images were obtained from the base of the skull through the vertex without intravenous contrast.  COMPARISON:  CT of the head performed 05/19/2014, and [name]/MRA of the brain performed 05/20/2014  FINDINGS: There is no evidence of acute infarction, mass lesion, or intra- or extra-axial hemorrhage on CT.  There is stable mild prominence of the occipital horns of the lateral ventricles, thought to reflect a normal variant. Mild subcortical white matter change likely reflects small vessel ischemic microangiopathy.  The posterior fossa, including the cerebellum, brainstem and fourth ventricle, is  within normal limits. The third ventricle and basal ganglia are unremarkable in appearance. The cerebral hemispheres are symmetric in appearance, with normal gray-white differentiation. No mass effect or midline shift is seen.  There is no evidence of fracture. The orbits are within normal limits. A mucus retention cyst or polyp is noted at the left maxillary sinus. The remaining paranasal sinuses and mastoid air cells are well-aerated. Bilateral soft tissue lipomas are seen overlying the parietal calvarium, with slight bony remodeling noted on the left. Lipomas measure 1.8 cm on the right and 0.9 cm on the left.  IMPRESSION: 1. No acute intracranial pathology seen on CT. 2. Stable prominence of the occipital horns of the lateral ventricles is thought to reflect a normal variant. 3. Mild small vessel ischemic microangiopathy. 4. Mucus retention cyst or polyp at the left maxillary sinus. 5. Bilateral soft tissue lipomas noted  overlying the parietal calvarium, with slight bony remodeling noted on the left. Lipomas measure 1.8 cm on the right and 0.9 cm on the left.   Electronically Signed   By: Roanna Raider M.D.   On: 07/05/2014 23:37        Olivia Mackie, MD 07/06/14 [phone]

## 2014-07-07 LAB — URINE CULTURE
COLONY COUNT: NO GROWTH
CULTURE: NO GROWTH
Special Requests: NORMAL

## 2014-07-10 ENCOUNTER — Encounter: Payer: Self-pay | Admitting: *Deleted

## 2014-07-10 ENCOUNTER — Telehealth: Payer: Self-pay | Admitting: Neurology

## 2014-07-10 NOTE — Telephone Encounter (Signed)
Miguel ConroyFYI-[name] Jenkins patient's girlfriend is calling regarding patient to advise patient was seen in the ER last Friday night with an elevated sugar level of 450 with confusion. The ER got his sugar down to 200 and patient was sent home. Patient does have an appointment with Dr. Roda ShuttersXu 1-12.

## 2014-07-12 NOTE — Telephone Encounter (Signed)
Dr Paulette BlanchXu FYI

## 2014-07-12 NOTE — Telephone Encounter (Signed)
Good to know. Thank you.  Marvel PlanJindong Sayuri Rhames, MD PhD Stroke Neurology 07/12/2014 6:11 PM

## 2014-07-16 ENCOUNTER — Ambulatory Visit: Payer: Medicaid Other | Admitting: Neurology

## 2014-07-31 ENCOUNTER — Ambulatory Visit (INDEPENDENT_AMBULATORY_CARE_PROVIDER_SITE_OTHER): Payer: Medicaid Other | Admitting: Neurology

## 2014-07-31 ENCOUNTER — Encounter: Payer: Self-pay | Admitting: Neurology

## 2014-07-31 VITALS — BP 122/80 | HR 112 | Ht 68.0 in | Wt 202.0 lb

## 2014-07-31 DIAGNOSIS — E785 Hyperlipidemia, unspecified: Secondary | ICD-10-CM

## 2014-07-31 DIAGNOSIS — R531 Weakness: Secondary | ICD-10-CM

## 2014-07-31 DIAGNOSIS — M6289 Other specified disorders of muscle: Secondary | ICD-10-CM

## 2014-07-31 DIAGNOSIS — G9340 Encephalopathy, unspecified: Secondary | ICD-10-CM

## 2014-07-31 DIAGNOSIS — I10 Essential (primary) hypertension: Secondary | ICD-10-CM

## 2014-07-31 DIAGNOSIS — G629 Polyneuropathy, unspecified: Secondary | ICD-10-CM

## 2014-07-31 DIAGNOSIS — E1142 Type 2 diabetes mellitus with diabetic polyneuropathy: Secondary | ICD-10-CM

## 2014-07-31 DIAGNOSIS — G43409 Hemiplegic migraine, not intractable, without status migrainosus: Secondary | ICD-10-CM

## 2014-07-31 DIAGNOSIS — G43909 Migraine, unspecified, not intractable, without status migrainosus: Secondary | ICD-10-CM | POA: Insufficient documentation

## 2014-07-31 MED ORDER — TOPIRAMATE 50 MG PO TABS: 50.0000 mg | ORAL_TABLET | Freq: Two times a day (BID) | ORAL | Status: DC

## 2014-07-31 NOTE — Progress Notes (Signed)
STROKE NEUROLOGY FOLLOW UP NOTE  NAME: Miguel Diaz  REASON FOR VISIT: stroke follow up HISTORY FROM: wife and chart   Today we had the pleasure of seeing Miguel Diaz. in follow-up at our Neurology Clinic. Pt was accompanied by wife.   History Summary Miguel Diaz is a 44 y.o. male with a history of DM, DM neuropathy, HLD, blindness was first admitted in 01/2014 for syncope episode after voiding. He could not remember what happened exactly. Initial workup found a normal CBC, BMP, A1c 8.6, troponin was negative. A CT of c-spine showed no fracture, and CT head showed a 2m punctate lesion in right posterior fossa concerning for subarachnoid hemorrhage or cerebellar hemorrhage. [name] brain showed no bleed, and the punctate lesion was smaller but present on CT head done in 2013, felt this was consistent with choroid plexus calcification. He had normal orthostatic vital signs and was monitored on tele overnight with no events, repeat EKG continued to show TWI in III and aVF.Carotid dopplers, echocardiogram, and myoview non-concerning per cardiology.    He was again admitted on 02/11/14 for acute onset right-sided numbness and weakness. Wife states that he then became nonverbal and seemed confused but no LOC for a brief period of time, then began improving in transit with EMS. On arrival to ER, he still had some right-sided weakness, but this seemed to improve but he continued to have right-sided numbness. [name] showed questionable punctate acute infarct in the left dorsal brainstem at the junction of pons and midbrain. However, this did not correspond to pt symptoms. LDL 78 and A1C 8.6. He was enrolled in SOCRATES trial and on either ASA or brilinta for stroke prevention. However, he sated that he did have headache after the initial event.  03/27/14 follow up - the patient has been doing stable. He stated that his symptoms of right side weakness and numbness are gone, only  has some bilateral toe numbness which he has before due to DM neuropathy. He stated that his sugar is better, this am 127, more strict diet at home.  He continued to have vision difficulties. He stated that her in 2013, he had left eye vision loss, sudden onset, was told to be due to diabetes or maybe congenital causes. [facility]2014[facility]. He was told to have post-micturition syncope and was discharged after improvement. But wife said since then he had right eye tunnel vision. Has been followed up with eye doctor and etiology not clear.   Interval History During the interval time, he was admitted on 04/08/14 due to syncope. He mentions that he was trying to let his dog in the house and the next thing that he remembers was he was surrounded by EMS. He called his wife to get some help as he had fallen down although patient does not remember that. When the wife reached home within a few minutes she found that he was lying down on the floor unresponsive to verbal command. There was no incontinence or seizure-like activity is noted. There was no tongue bite noted. Patient become more alert with arrival of the EMS. EEG was normal.    He was admitted again in 05/2014 with acute onset nonverbal, not feeling right, confusion and right sided numbness/weakness associated with HA. CT showed no acute abnormality and [name] showed no acute stroke. EEG normal, no seizure. He was discharge with ASA (has finished SOCRATES trial).   On  07/05/14, pt was sent to ER by EMS due to acute onset headache, confusion and right sided heaviness feeling. CT head negative. He was found to have elevated blood glucose at 400s. He was discharged with outpt follow up.   He stated that he has migraine since he was young and for almost 40 years. Usually once a month, lasting 1-2 days. HA happens at the back of head, or bilateral temporal or at the top of head, 8-9/10, does not take medication for HA except  sometimes tylenol. His father had hx of migraine but found out to have aneurysm. He denies migraine in other family members.  He still has vision difficulty and recent visit of ophthalmology and was told no change of visual acuity. 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   REVIEW OF SYSTEMS: Full 14 system review of systems performed and notable only for those listed below and in HPI above, all others are negative:  Constitutional: N/A  Cardiovascular: N/A  Ear/Nose/Throat: ear pain  Skin: N/A  Eyes: Loss of vision  Respiratory: N/A  Gastroitestinal: N/A  Genitourinary: N/A Hematology/Lymphatic: N/A  Endocrine: N/A  Musculoskeletal: N/A  Allergy/Immunology: N/A Neurological: confusion and memory loss  Psychiatric: Depression, anxiety, daytime sleepiness, agitation, decreased concentration  The following represents the patient's updated allergies and side effects list: Allergies  Allergen Reactions  . Penicillins Other (See Comments)    Childhood allergy  . Penicillins     unknown  . Toradol [Ketorolac Tromethamine] Hives  . Toradol [Ketorolac Tromethamine]     Hives     Labs since last visit of relevance include the following: Results for orders placed or performed during the hospital encounter of 07/05/14  Urine culture  Result Value Ref Range   Specimen Description URINE, CLEAN CATCH    Special Requests Normal    Colony Count NO GROWTH Performed at [facility]     Culture NO GROWTH Performed at [facility]     Report Status 07/07/2014 FINAL   Basic metabolic panel  Result Value Ref Range   Sodium 138 135 - 145 mmol/L   Potassium 3.6 3.5 - 5.1 mmol/L   Chloride 103 96 - 112 mEq/L   CO2 24 19 - 32 mmol/L   Glucose, Bld 201 (H) 70 - 99 mg/dL   BUN 10 6 - 23 mg/dL   Creatinine, Ser 0.62 0.50 - 1.35 mg/dL   Calcium 9.3 8.4 - 10.5 mg/dL   GFR calc non Af Amer >90 >90 mL/min   GFR calc Af Amer >90 >90 mL/min   Anion gap 11  5 - 15  CBC with Differential  Result Value Ref Range   WBC 13.7 (H) 4.0 - 10.5 K/uL   RBC 5.07 4.22 - 5.81 MIL/uL   Hemoglobin 16.3 13.0 - 17.0 g/dL   HCT 46.2 39.0 - 52.0 %   MCV 91.1 78.0 - 100.0 fL   MCH 32.1 26.0 - 34.0 pg   MCHC 35.3 30.0 - 36.0 g/dL   RDW 12.7 11.5 - 15.5 %   Platelets 126 (L) 150 - 400 K/uL   Neutrophils Relative % 73 43 - 77 %   Lymphocytes Relative 19 12 - 46 %   Monocytes Relative 6 3 - 12 %   Eosinophils Relative 2 0 - 5 %   Basophils Relative 0 0 - 1 %   Neutro Abs 10.0 (H) 1.7 - 7.7 K/uL   Lymphs Abs 2.6 0.7 - 4.0 K/uL  Monocytes Absolute 0.8 0.1 - 1.0 K/uL   Eosinophils Absolute 0.3 0.0 - 0.7 K/uL   Basophils Absolute 0.0 0.0 - 0.1 K/uL   WBC Morphology TOXIC GRANULATION   Urinalysis, Routine w reflex microscopic  Result Value Ref Range   Color, Urine YELLOW YELLOW   APPearance CLEAR CLEAR   Specific Gravity, Urine 1.014 1.005 - 1.030   pH 6.0 5.0 - 8.0   Glucose, UA 500 (A) NEGATIVE mg/dL   Hgb urine dipstick NEGATIVE NEGATIVE   Bilirubin Urine NEGATIVE NEGATIVE   Ketones, ur NEGATIVE NEGATIVE mg/dL   Protein, ur NEGATIVE NEGATIVE mg/dL   Urobilinogen, UA 0.2 0.0 - 1.0 mg/dL   Nitrite NEGATIVE NEGATIVE   Leukocytes, UA NEGATIVE NEGATIVE  CBG monitoring, ED  Result Value Ref Range   Glucose-Capillary 212 (H) 70 - 99 mg/dL    The neurologically relevant items on the patient's problem list and lesion he and currently he equal and were reviewed on today's visit.  Neurologic Examination  A problem focused neurological exam (12 or more points of the single system neurologic examination, vital signs counts as 1 point, cranial nerves count for 8 points) was performed.  Blood pressure 122/80, pulse 112, height _0  (1.727 m), weight 202 lb (91.627 kg).  General - Well nourished, well developed, in no apparent distress.  Ophthalmologic - not able to see through  Cardiovascular - Regular rate and rhythm with no murmur.  Mental Status -    Level of arousal and orientation to time, place, and person were intact. Language including expression, naming, repetition, comprehension was assessed and found intact.  Cranial Nerves II - XII - II - left eye vision loss, not able to see light or hand waving. Right eye central vision can see Finger counting. III, IV, VI - Extraocular movements intact, pupillary reflex bilateral intact. V - Facial sensation intact bilaterally. VII - Facial movement intact bilaterally. VIII - Hearing & vestibular intact bilaterally. X - Palate elevates symmetrically. XI - Chin turning & shoulder shrug intact bilaterally. XII - Tongue protrusion intact.  Motor Strength - The patient's strength was normal in all extremities and pronator drift was absent.  Bulk was normal and fasciculations were absent.   Motor Tone - Muscle tone was assessed at the neck and appendages and was normal.  Reflexes - The patient's reflexes were normal in all extremities and he had no pathological reflexes.  Sensory - Light touch, temperature/pinprick were assessed and were normal.    Coordination - The patient had normal movements in the hands and feet with no ataxia or dysmetria.  Tremor was absent.  Gait and Station - slow due to vision deficit, but no hemiparetic gait.  Data reviewed: I personally reviewed the images and agree with the radiology interpretations.  Ct Head Wo Contrast: 01/25/2014 1. Punctate 3 mm focus of increased is seen in the right posterior fossa. This could be a tiny subarachnoid hemorrhage or right cerebellar hemorrhage. 2. Right frontal sinusitis. Multiple mucous retention cyst noted throughout the paranasal sinuses. 3. No evidence of cervical spine fracture or dislocation.   Ct Cervical Spine Wo Contrast 01/25/2014: 1. Punctate 3 mm focus of increased is seen in the right posterior fossa. This could be a tiny subarachnoid hemorrhage or right cerebellar hemorrhage. 2. Right frontal sinusitis. Multiple  mucous retention cyst noted throughout the paranasal sinuses. 3. No evidence of cervical spine fracture or dislocation.   [name] Brain Wo Contrast: 01/25/2014 1. No evidence of acute intracranial abnormality. The  punctate hyperattenuating focus near the right foramen of Luschka on recent CT also appears to have been present on the 2013 [name], although less conspicuous on the older examination. Therefore, this most likely represents calcification, such as in choroid plexus, rather than acute hemorrhage. 2. Mild-to-moderate chronic small vessel ischemic disease.   01/26/14 Echocardiogram: Upper normal wall thickness with LVEF 02-72%, grade 2 diastolic dysfunction. MAC with trivial mitral regurgitation. PASP 12 mmHg.   Carotid dopplers: BIlateral: mild soft plaque CCA and origin ICA. 1-39% ICA stenosis. Vertebral artery flow is antegrade.  MYOCARDIAL IMAGING WITH SPECT (REST AND PHARMACOLOGIC-STRESS): No definite inducible ischemia with pharmacologic stress. 67% ejection fraction  Ct Angio Head and neck W/cm &/or Wo Cm  02/11/2014  IMPRESSION: No carotid bifurcation stenosis. Smooth atherosclerotic thickening of the vascular walls. Carotid siphon atherosclerosis bilaterally, 30% stenosis on the right and 50% stenosis on the left. No evidence of large or medium vessel embolic occlusion.   Ct Head Wo Contrast  02/11/2014  No acute finding on today's study. Chronic small vessel ischemic changes of the cerebral hemispheric white matter. Punctate density in the right cerebellum seen on 07/24 is no longer visible and presumably represented a microhemorrhage which was acute at that time. ( I think it is not the same cut and the 8/10 one still has a little cut of the hyperdensity view.)  07/05/14 1. No acute intracranial pathology seen on CT. 2. Stable prominence of the occipital horns of the lateral ventricles is thought to reflect a normal variant. 3. Mild small vessel ischemic microangiopathy. 4. Mucus  retention cyst or polyp at the left maxillary sinus. 5. Bilateral soft tissue lipomas noted overlying the parietal calvarium, with slight bony remodeling noted on the left. Lipomas measure 1.8 cm on the right and 0.9 cm on the left.   [name] Brain Wo Contrast  02/11/2014 IMPRESSION: Suggestion of a 2 mm acute infarction at the junction of the pons and midbrain dorsally on the left. This could be in the region of the trochlear nucleus, MLF or superior cerebellar peduncle. Normal intracranial MR angiography of the large and medium size vessels.   [name] brain 04/08/14 - Chronic microvascular ischemic change in the white matter stable. Negative for acute infarct.  [name] and MRA  05/20/14 1. No acute intracranial abnormality identified. 2. Moderate chronic small vessel ischemic disease. 3. Motion degraded MRA without evidence of major intracranial arterial occlusion or significant proximal stenosis. Left MCA branch vessels not well evaluated.   Component     Latest Ref Rng 01/27/2014 02/12/2014  Cholesterol     0 - 200 mg/dL 152 147  Triglycerides     <150 mg/dL 213 (H) 179 (H)  HDL     >39 mg/dL 34 (L) 33 (L)  Total CHOL/HDL Ratio      4.5 4.5  VLDL     0 - 40 mg/dL 43 (H) 36  LDL (calc)     0 - 99 mg/dL 75 78  Hemoglobin A1C     <5.7 %  8.6 (H)  Mean Plasma Glucose     <117 mg/dL  200 (H)  TSH     0.350 - 4.500 uIU/mL 1.620     Assessment: As you may recall, he is a 44 y.o. Caucasian male with PMH of DM, DM neuropathy, HLD, blindness was admitted on five times since 01/2014 for episodic LOC, unresponsive to verbal commands, confusion, nonverbal, right sided weakness/heavy feeling, headache. Etiology not that clear. He may have seizure with  post ictal but no shaking jerking and EEG normal. 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 He may have GCA as he has vision loss and HA, but he is too young for that. He may have MELAS syndrome as he has  stroke like symptoms, encephalopathy, weakness, DM, vision loss, but his lactate (whole blood) in high normal range and no clear FH.     Plan:  - Continue ASA and lipitor for stroke prevention  - Follow up with your primary care physician for stroke risk factor modification. Recommend maintain blood pressure goal <130/80, diabetes with hemoglobin A1c goal below 6.5% and lipids with LDL cholesterol goal below 70 mg/dL.  - Monitor blood pressure and glucose at home  - need to consider to check ESR/CRP, plasma lactate and pyruvate, also may consider muscle biopsy, and eventually genetic testing.  - topamax for HA prevention but also need to consider verampil for hemiplegic headache.  - RTC in 2 months   No orders of the defined types were placed in this encounter.    Meds ordered this encounter  Medications  . topiramate (TOPAMAX) 50 MG tablet    Sig: Take 1 tablet (50 mg total) by mouth 2 (two) times daily.    Dispense:  60 tablet    Refill:  2    Patient Instructions  - continue ASA and lipitor for stroke prevention - Follow up with your primary care physician for stroke risk factor modification. Recommend maintain blood pressure goal <130/80, diabetes with hemoglobin A1c goal below 6.5% and lipids with LDL cholesterol goal below 70 mg/dL.  - check glucose at home - topamax for headache prevention - take tylenol ASAP at the start of headache - follow up in 2 months.    Rosalin Hawking, MD PhD [facility] [address], [place], [place] [address] [phone]

## 2014-07-31 NOTE — Patient Instructions (Signed)
-   continue ASA and lipitor for stroke prevention - Follow up with your primary care physician for stroke risk factor modification. Recommend maintain blood pressure goal <130/80, diabetes with hemoglobin A1c goal below 6.5% and lipids with LDL cholesterol goal below 70 mg/dL.  - check glucose at home - topamax for headache prevention - take tylenol ASAP at the start of headache - follow up in 2 months.

## 2014-08-01 ENCOUNTER — Other Ambulatory Visit: Payer: Self-pay | Admitting: Neurology

## 2014-08-01 DIAGNOSIS — G9340 Encephalopathy, unspecified: Secondary | ICD-10-CM

## 2014-08-01 NOTE — Progress Notes (Signed)
Called pt over the phone and got additional history. Will check several blood tests to rule out temporal arteritis and MELAS. May consider LP, muscle biopsy or genetic testing depending on the test results. Pt will come sometime next week for blood draw in the clinic.  Marvel PlanJindong Erdem Naas, MD PhD Stroke Neurology 08/01/2014 5:03 PM

## 2014-08-03 NOTE — Progress Notes (Addendum)
Additional history:  September 2013, left eye vision loss, no pain, acute onset, no recovery  February 2014, right eye, blurry vision, comes and goes  July 2015, right eye lost vision, acute, recovered the little over time  Denies weakness, he uses cane for walking due to vision problems but not for weakness  Denies any hearing problems, no kidney problems, no heart problems, denies family history of vision loss.  Denies short statue, but does admit he has some memory problem.  Stated that his headache is more at the back of head or the right side head.  He has [employer] insurance.

## 2014-08-15 ENCOUNTER — Encounter: Payer: Self-pay | Admitting: Cardiology

## 2014-08-15 ENCOUNTER — Other Ambulatory Visit (INDEPENDENT_AMBULATORY_CARE_PROVIDER_SITE_OTHER): Payer: Self-pay

## 2014-08-15 ENCOUNTER — Ambulatory Visit (INDEPENDENT_AMBULATORY_CARE_PROVIDER_SITE_OTHER): Payer: Medicaid Other | Admitting: Cardiology

## 2014-08-15 ENCOUNTER — Ambulatory Visit: Payer: Medicaid Other | Attending: Internal Medicine

## 2014-08-15 VITALS — BP 110/78 | Ht 68.0 in | Wt 198.0 lb

## 2014-08-15 DIAGNOSIS — R55 Syncope and collapse: Secondary | ICD-10-CM

## 2014-08-15 DIAGNOSIS — R41841 Cognitive communication deficit: Secondary | ICD-10-CM

## 2014-08-15 DIAGNOSIS — I10 Essential (primary) hypertension: Secondary | ICD-10-CM

## 2014-08-15 DIAGNOSIS — Z0289 Encounter for other administrative examinations: Secondary | ICD-10-CM

## 2014-08-15 DIAGNOSIS — I951 Orthostatic hypotension: Secondary | ICD-10-CM

## 2014-08-15 NOTE — Patient Instructions (Signed)
Your physician recommends that you continue on your current medications as directed. Please refer to the Current Medication list given to you today.  Your physician wants you to follow-up in: 6 months ov/ekg  You will receive a reminder letter in the mail two months in advance. If you don't receive a letter, please call our office to schedule the follow-up appointment.

## 2014-08-15 NOTE — Addendum Note (Signed)
Addended by: Margo AyeLOGAN, Shimika Ames L on: 08/15/2014 01:03 PM   Modules accepted: Orders

## 2014-08-15 NOTE — Progress Notes (Signed)
Cardiology Office Note   Date:  08/15/2014   ID:  Miguel CoeGeorge L Maryland Jr., DOB December 13, 1970, MRN [id number]  PCP:  Sherilyn BankerHEREN, ISA, MD  Cardiologist:   Cassell Clementhomas Dorena Dorfman, MD   No chief complaint on file.     History of Present Illness: Miguel CoeGeorge L Beauchamp Jr. is a 44 y.o. male who presents for follow-up office visit.   HPI: This is a 44 year old male patient who was seen in the hospital in consult by Dr. Patty SermonsBrackbill in July 2015 for syncope of undetermined etiology possible micturition syncope. He was having atypical chest pain as well and has multiple cardiac risk factors for CAD including premature coronary disease with a strong family history, diabetes mellitus, hypercholesterolemia and a long-time smoker. 2-D echo in July EF was 60-65% with grade 2 diastolic dysfunction, and may see with trivial MR. PA SP of 12 mmHg. He had a cardiac monitor for 30 days in October 2015 that was normal. Stress Myoview 01/27/14 no definite inducible ischemia with pharmacologic stress EF 67%.  Patient has had multiple admissions with recurrent falls as well as a CVA in August 2015(SOCRATEs trial-aspirin vs ticagrelor). He says he gets dizzy if he stands up quickly. The last time he went to the emergency room 11/21/15he was bending over to tie shoes and started up quickly and fell over he never lost consciousness. MI was ruled out and no arrhythmias were documented. His lisinopril had been stopped because of hypotension. He was on this for his kidneys. CT angios the chest showed no evidence of significant pulmonary embolus. Another 2-D echo in November/16/15 normal systolic function EF 55-60% and states LV diastolic function parameters were normal. Severely dilated coronary sinus is consistent with persistent left superior vena cava syndrome which is rarely a significant clinical abnormality unless the patient requires cardiac rhythm device. If necessary this can be confirmed using a left antecubital vein saline contrast  study.  Since last visit the patient has been feeling better.  His PCP has started him on Topamax and he has had no further episodes of syncope or collapse.  Typically he would have a headache prior to the onset of his syncope. The patient has made a change in his diet also.  He is avoiding caffeine.  He has cut back on his cigarette smoking from 2 packs a day to one half pack a day. He is in the midst of a disability hearing. The patient has had diabetes since 2013 and has painful diabetic neuropathy for which he takes Lyrica and sometimes supplements with ibuprofen.  Past Medical History  Diagnosis Date  . Diabetes mellitus without complication   . Neuropathy   . Asthma   . Vision loss of left eye   . Blindness     bilateral  . Hypercholesteremia   . Depression   . Anxiety   . Stroke 02/2014    right sided weakness/numbness    Past Surgical History  Procedure Laterality Date  . Hernia repair    . Eye surgery Left   . Abdominal surgery       Current Outpatient Prescriptions  Medication Sig Dispense Refill  . albuterol (PROVENTIL HFA;VENTOLIN HFA) 108 (90 BASE) MCG/ACT inhaler Inhale 2 puffs into the lungs every 6 (six) hours as needed for wheezing or shortness of breath.    Marland Kitchen. aspirin 325 MG tablet Take 1 tablet (325 mg total) by mouth daily. 30 tablet 0  . atorvastatin (LIPITOR) 40 MG tablet Take 40 mg by mouth every evening.     .Marland Kitchen  FLUoxetine (PROZAC) 40 MG capsule Take 40 mg by mouth daily.    Marland Kitchen glipiZIDE (GLUCOTROL XL) 10 MG 24 hr tablet Take 10 mg by mouth daily with breakfast.    . ibuprofen (ADVIL,MOTRIN) 600 MG tablet Take 600 mg by mouth every 6 (six) hours as needed for moderate pain.     . metFORMIN (GLUMETZA) 500 MG (MOD) 24 hr tablet Take 1,000 mg by mouth 2 (two) times daily with a meal.     . Multiple Vitamins-Minerals (MULTIVITAMIN WITH MINERALS) tablet Take 1 tablet by mouth daily.    . pregabalin (LYRICA) 150 MG capsule Take 150 mg by mouth 2 (two) times daily.     Marland Kitchen topiramate (TOPAMAX) 50 MG tablet Take 1 tablet (50 mg total) by mouth 2 (two) times daily. 60 tablet 2   No current facility-administered medications for this visit.    Allergies:   Penicillins; Penicillins; Toradol; and Toradol    Social History:  The patient  reports that he has been smoking Cigarettes.  He has been smoking about 1.00 pack per day. He has never used smokeless tobacco. He reports that he does not drink alcohol or use illicit drugs.   Family History:  The patient's family history includes High blood pressure in his mother, paternal grandfather, and sister.    ROS:  Please see the history of present illness.   Otherwise, review of systems are positive for none.   All other systems are reviewed and negative.    PHYSICAL EXAM: VS:  BP 110/78 mmHg  Ht  (1.727 m)  Wt 198 lb (89.812 kg)  BMI 30.11 kg/m2 , BMI Body mass index is 30.11 kg/(m^2). GEN: Well nourished, well developed, in no acute distress HEENT: normal Neck: no JVD, carotid bruits, or masses Cardiac: RRR; no murmurs, rubs, or gallops,no edema  Respiratory:  clear to auscultation bilaterally, normal work of breathing GI: soft, nontender, nondistended, + BS MS: no deformity or atrophy Skin: warm and dry, no rash Neuro:  Strength and sensation are intact Psych: euthymic mood, full affect   EKG:  EKG is not ordered today.    Recent Labs: 01/27/2014: TSH 1.620 05/19/2014: ALT 25 07/06/2014: BUN 10; Creatinine 0.62; Hemoglobin 16.3; Platelets 126*; Potassium 3.6; Sodium 138    Lipid Panel    Component Value Date/Time   CHOL 100 05/20/2014 0434   TRIG 70 05/20/2014 0434   HDL 31* 05/20/2014 0434   CHOLHDL 3.2 05/20/2014 0434   VLDL 14 05/20/2014 0434   LDLCALC 55 05/20/2014 0434      Wt Readings from Last 3 Encounters:  08/15/14 198 lb (89.812 kg)  07/31/14 202 lb (91.627 kg)  06/03/14 197 lb 1.9 oz (89.413 kg)         ASSESSMENT AND PLAN: 1.  Essential hypertension 2.   Previous orthostatic hypotension, improved off lisinopril 3.  Tobacco abuse 4.  Type 2 diabetes mellitus with diabetic neuropathy 5.  Persistent left superior vena cava   Current medicines are reviewed at length with the patient today.  The patient does not have concerns regarding medicines.  The following changes have been made:  no change  Labs/ tests ordered today include: None  No orders of the defined types were placed in this encounter.     Disposition:   FU with Dr. Patty Sermons in 6 months for office visit and EKG.  Encouraged him in efforts at smoking cessation and limiting caffeine   Signed, Cassell Clement, MD  08/15/2014 5:49 PM  [facility] [place], [place], [place]  [address] Phone: [phone]; Fax: [phone]

## 2014-08-15 NOTE — Therapy (Signed)
[facility] [address] [place], [place], [address] Phone: [phone]   Fax:  [phone]  Speech Language Pathology Treatment  Patient Details  Name: Miguel CoeGeorge L Romo Jr. MRN: [id number] Date of Birth: [birth year] Referring Provider:  Sherilyn Bankerheren, Isa, MD  Encounter Date: 08/15/2014      End of Session - 08/15/14 1359    Visit Number 2   Number of Visits 5   Authorization Type medicaod   Authorization Time Period 08-06-14 to 09-15-12   Authorization - Visit Number 1   Authorization - Number of Visits 6   SLP Start Time 1319   SLP Stop Time  1400   SLP Time Calculation (min) 41 min   Activity Tolerance Patient tolerated treatment well      Past Medical History  Diagnosis Date  . Diabetes mellitus without complication   . Neuropathy   . Asthma   . Vision loss of left eye   . Blindness     bilateral  . Hypercholesteremia   . Depression   . Anxiety   . Stroke 02/2014    right sided weakness/numbness    Past Surgical History  Procedure Laterality Date  . Hernia repair    . Eye surgery Left   . Abdominal surgery      There were no vitals taken for this visit.  Visit Diagnosis: Cognitive communication deficit      Subjective Assessment - 08/15/14 1330    Symptoms Pt reports frustration with remembering appointments, and with telling wife when appointments have been scheduled.             ADULT SLP TREATMENT - 08/15/14 1330    General Information   Behavior/Cognition Alert;Cooperative;Pleasant mood   Pain Assessment   Pain Assessment 0-10   Pain Score 5    Pain Location feet and lets   Pain Descriptors / Indicators Sharp;Pins and needles   Pain Intervention(s) Monitored during session   Cognitive-Linquistic Treatment   Treatment focused on Cognition   Skilled Treatment SLP assisted pt and wife think through some situations in which pt's decr'd memory is detrimental to his health or to their  relationship. SLP suggested pt begin to use alarm for morning meds, have a calendar (yearly) to monitor appointments, and have a pad on the refrigerator for any item that needs to be communicated to pt wife. Pt/wife to trial these plans and report back to SLP next session.   Assessment / Recommendations / Plan   Plan Continue with current plan of care   Progression Toward Goals   Progression toward goals Progressing toward goals          SLP Education - 08/15/14 1359    Education provided Yes   Education Details memory strategies/techniques   Person(s) Educated Patient;Spouse   Methods Explanation   Comprehension Verbalized understanding          SLP Short Term Goals - 08/15/14 1402    SLP SHORT TERM GOAL #1   Title 1   Baseline no device yet   Time 1   Period Weeks   Status New   SLP SHORT TERM GOAL #2   Title pt will verbally provide appropriate responses for situations/circumstances requiring use of memory device   Baseline pt does not yet have memory device   Time 1   Period Weeks   Status New          SLP Long Term Goals - 08/15/14 1402    SLP  LONG TERM GOAL #1   Title pt will report use of memory device successfully during the week x3   Baseline pt does not yet have memory device   Time 55   Period Weeks   Status New   SLP LONG TERM GOAL #2   Title pt will report use of his to-do list successfully during the week x2   Baseline pt does not currently use a to-do list   Time 5   Period Weeks   Status New          Plan - 08/15/14 1401    Clinical Impression Statement pt now has some strategies/techniques to use to compensate for memory deficit. Skilled ST to cont to modify/change these techniques as success does or does not occur with strategies.   Speech Therapy Frequency 1x /week   Duration --  5 weeks   Treatment/Interventions Compensatory techniques;Functional tasks;Patient/family education;SLP instruction and feedback   Potential to Achieve Goals  Good   Potential Considerations Ability to learn/carryover information        Problem List Patient Active Problem List   Diagnosis Date Noted  . Encephalopathy 08/01/2014  . Migraine 07/31/2014  . 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 Orthostatic hypotension 06/03/2014  . 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 Cerebral thrombosis with cerebral infarction 05/20/2014  . Right sided weakness 05/19/2014  . Stroke   . DM type 2 with diabetic peripheral neuropathy 04/09/2014  . Cerebral infarction due to thrombosis of cerebral artery 03/27/2014  . Essential hypertension, benign 03/27/2014  . HLD (hyperlipidemia) 03/27/2014  . Type II or unspecified type diabetes mellitus with peripheral circulatory disorders, uncontrolled(250.72) 02/12/2014  . Tobacco abuse 02/12/2014  . CVA (cerebral infarction) 02/11/2014  . Syncope 01/25/2014  . Hip pain 01/25/2014  . SAH (subarachnoid hemorrhage) 01/25/2014  . Vision, loss, sudden 01/25/2014  . Type II or unspecified type diabetes mellitus without mention of complication, uncontrolled 01/25/2014    Junetta Hearn,SLP 08/15/2014, 2:04 PM  [facility] [address] [place], [place], [address] Phone: [phone]   Fax:  [phone]

## 2014-08-15 NOTE — Patient Instructions (Signed)
Get alarm clock for AM meds Get calendar for the year for writing down and checking appointments Get a paper pad on the frig for things needing to be communicated between the two of you

## 2014-08-16 ENCOUNTER — Ambulatory Visit: Payer: Self-pay | Admitting: Cardiology

## 2014-08-17 LAB — CK: CK TOTAL: 208 U/L — AB (ref 24–204)

## 2014-08-17 LAB — SEDIMENTATION RATE: SED RATE: 3 mm/h (ref 0–15)

## 2014-08-17 LAB — LACTIC ACID, PLASMA: Lactate, Ven: 15.3 mg/dL (ref 4.5–19.8)

## 2014-08-17 LAB — THYROGLOBULIN ANTIBODY: Thyroglobulin Antibody: <1 IU/mL (ref 0.0–0.9)

## 2014-08-17 LAB — C-REACTIVE PROTEIN: CRP: 5.5 mg/L — ABNORMAL HIGH (ref 0.0–4.9)

## 2014-08-17 LAB — THYROID PEROXIDASE ANTIBODY: Thyroperoxidase Ab SerPl-aCnc: 13 IU/mL (ref 0–34)

## 2014-08-23 ENCOUNTER — Ambulatory Visit: Payer: Medicaid Other

## 2014-08-23 DIAGNOSIS — R41841 Cognitive communication deficit: Secondary | ICD-10-CM | POA: Diagnosis not present

## 2014-08-23 NOTE — Therapy (Signed)
[facility] [address] [place], [place], [address] Phone: [phone]   Fax:  [phone]  Speech Language Pathology Treatment  Patient Details  Name: Miguel Diaz. MRN: [id number] Date of Birth: [birth year] Referring Provider:  Sherilyn Banker, MD  Encounter Date: 08/23/2014      End of Session - 08/23/14 1539    Visit Number 3   Number of Visits 5   Date for SLP Re-Evaluation 09/12/14   Authorization Type medicaod   Authorization Time Period 08-06-14 to 09-15-12   Authorization - Visit Number 2   Authorization - Number of Visits 6   SLP Start Time 1448   SLP Stop Time  1518   SLP Time Calculation (min) 30 min   Activity Tolerance Patient tolerated treatment well      Past Medical History  Diagnosis Date  . Diabetes mellitus without complication   . Neuropathy   . Asthma   . Vision loss of left eye   . Blindness     bilateral  . Hypercholesteremia   . Depression   . Anxiety   . Stroke 02/2014    right sided weakness/numbness    Past Surgical History  Procedure Laterality Date  . Hernia repair    . Eye surgery Left   . Abdominal surgery      There were no vitals taken for this visit.  Visit Diagnosis: Cognitive communication deficit      Subjective Assessment - 08/23/14 1450    Symptoms "They're (things talked about last week) going good." No alarm yet for meds but working on it, per pt.             ADULT SLP TREATMENT - 08/23/14 1451    General Information   Behavior/Cognition Alert;Cooperative;Pleasant mood   Pain Assessment   Pain Assessment 0-10   Pain Score 3    Pain Location legs bil   Pain Descriptors / Indicators Dull   Pain Intervention(s) Monitored during session   Cognitive-Linquistic Treatment   Treatment focused on Cognition   Skilled Treatment Pt trying to get into habit of taking morning meds after eating breakfast but plans to get timer in case pt does not eat  with wife. Pt takes night meds with wife. SLP discussed with pt that best to have system for meds independent upon wife. Pt's other systems (calendar on frig and notepad on frig) are working very well per pt and wife. He told SLP situations where he used  his memory compensations.   Assessment / Recommendations / Plan   Plan Continue with current plan of care  possible d/c next visit   Progression Toward Goals   Progression toward goals Progressing toward goals            SLP Short Term Goals - 08/23/14 1548    SLP SHORT TERM GOAL #1   Title obtain low tech memory device and use it   Baseline no device yet   Time --   Period --   Status Achieved   SLP SHORT TERM GOAL #2   Title pt will verbally provide appropriate responses for situations/circumstances requiring use of memory device   Baseline pt does not yet have memory device   Time --   Period --   Status Achieved          SLP Long Term Goals - 08/23/14 1553    SLP LONG TERM GOAL #1   Title pt will report use of memory  device successfully during the week x3   Baseline pt does not yet have memory device   Time --   Status Achieved   SLP LONG TERM GOAL #2   Title pt will report use of his to-do list successfully during the week x2   Baseline pt does not currently use a to-do list   Time 4   Period Weeks   Status On-going          Plan - 08/23/14 1546    Clinical Impression Statement Pt's new sytems work very well, still needs to use alarms for meds. Cont 1-2 more sessions due to need to see consistency.   Speech Therapy Frequency 1x /week   Duration 4 weeks   Treatment/Interventions Compensatory techniques;Functional tasks;Patient/family education;SLP instruction and feedback   Potential to Achieve Goals Good        Problem List Patient Active Problem List   Diagnosis Date Noted  . Encephalopathy 08/01/2014  . Migraine 07/31/2014  . Hemiplegic migraine without status migrainosus, not intractable  07/31/2014  . Orthostatic hypotension 06/03/2014  . 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 Cerebral thrombosis with cerebral infarction 05/20/2014  . Right sided weakness 05/19/2014  . Stroke   . DM type 2 with diabetic peripheral neuropathy 04/09/2014  . Cerebral infarction due to thrombosis of cerebral artery 03/27/2014  . Essential hypertension, benign 03/27/2014  . HLD (hyperlipidemia) 03/27/2014  . Type II or unspecified type diabetes mellitus with peripheral circulatory disorders, uncontrolled(250.72) 02/12/2014  . Tobacco abuse 02/12/2014  . CVA (cerebral infarction) 02/11/2014  . Syncope 01/25/2014  . Hip pain 01/25/2014  . SAH (subarachnoid hemorrhage) 01/25/2014  . Vision, loss, sudden 01/25/2014  . Type II or unspecified type diabetes mellitus without mention of complication, uncontrolled 01/25/2014    [facility],Jaklyn Alen, SLP 08/23/2014, 3:55 PM  [facility] [address] [place], [place], [address] Phone: [phone]   Fax:  [phone]

## 2014-09-04 ENCOUNTER — Ambulatory Visit: Payer: Medicaid Other | Attending: Internal Medicine

## 2014-09-04 DIAGNOSIS — R41841 Cognitive communication deficit: Secondary | ICD-10-CM | POA: Diagnosis present

## 2014-09-04 NOTE — Therapy (Signed)
[facility] [address] [place], [place], [address] Phone: [phone]   Fax:  [phone]  Speech Language Pathology Treatment  Patient Details  Name: Miguel Diaz. MRN: [id number] Date of Birth: Jul 09, 1970 Referring Provider:  Prudencio Burly, MD  Encounter Date: 09/04/2014      End of Session - 09/04/14 1222    Visit Number 4   Number of Visits 5   Date for SLP Re-Evaluation 09/12/14   Authorization Type medicaod   Authorization Time Period 08-06-14 to 09-15-12   Authorization - Visit Number 3   Authorization - Number of Visits 6   SLP Start Time 7017   SLP Stop Time  1217   SLP Time Calculation (min) 25 min   Activity Tolerance Patient tolerated treatment well      Past Medical History  Diagnosis Date  . Diabetes mellitus without complication   . Neuropathy   . Asthma   . Vision loss of left eye   . Blindness     bilateral  . Hypercholesteremia   . Depression   . Anxiety   . Stroke 02/2014    right sided weakness/numbness    Past Surgical History  Procedure Laterality Date  . Hernia repair    . Eye surgery Left   . Abdominal surgery      There were no vitals taken for this visit.  Visit Diagnosis: Cognitive communication deficit      Subjective Assessment - 09/04/14 1200    Symptoms Pt missed meds one night last week when wife was sick and pt turned off alarm  so she wouldn't wake up.             ADULT SLP TREATMENT - 09/04/14 1202    General Information   Behavior/Cognition Alert;Cooperative;Pleasant mood   Pain Assessment   Pain Assessment 0-10   Pain Score 3    Pain Location --  feet and legs bil   Pain Descriptors / Indicators Dull   Pain Intervention(s) Monitored during session   Cognitive-Linquistic Treatment   Treatment focused on Cognition   Skilled Treatment Pt using to-do list on frig as well as calendar to support his memory deficits. SLP problem-solved with  pt/wife so as to have pt 1) take all meds at night. Pt, wife agreed on a slightly different strategy to take meds - leave alarm in living room near meds. Second situation was pt forgot to start crock pot - SLP guided pt to understand he could have put the crock pot out on the counter as an association to put food in it for dinner.   Assessment / Recommendations / Plan   Plan Discharge SLP treatment due to (comment)  pt is satisfied with progress            SLP Short Term Goals - 08/23/14 1548    SLP SHORT TERM GOAL #1   Title obtain low tech memory device and use it   Baseline no device yet   Time --   Period --   Status Achieved   SLP SHORT TERM GOAL #2   Title pt will verbally provide appropriate responses for situations/circumstances requiring use of memory device   Baseline pt does not yet have memory device   Time --   Period --   Status Achieved          SLP Long Term Goals - 09/04/14 1225    SLP LONG TERM GOAL #1   Title pt  will report use of memory device successfully during the week x3   Baseline pt does not yet have memory device   Status Achieved   SLP LONG TERM GOAL #2   Title pt will report use of his to-do list successfully during the week x2   Baseline pt does not currently use a to-do list   Status Achieved          Plan - 09/04/14 1223    Clinical Impression Statement Alarms for meds working well but needed to change location of alarm for "exceptions" like wife being sick. Pt able to problem solve independently about using memory strategies.   Treatment/Interventions Compensatory techniques;Functional tasks;Patient/family education;SLP instruction and feedback   Potential to Achieve Goals Good   Potential Considerations Ability to learn/carryover information       SPEECH THERAPY DISCHARGE SUMMARY  Visits from Start of Care: 5  Current functional level related to goals / functional outcomes: Pt now is aware of how to use memory strategies to  undergird his memory deficits. He has developed a system for taking meds that with some minor modification wife and pt agree should work well, even in "exception" situations.  A to do list has been placed on the refrigerator to assist pt with what he needs to communicate to wife each day, with wife looking at it every day upon her return from work.   Remaining deficits: Memory deficits    Education / Equipment: Memory compensations, medication administration ideas.  Plan: Patient agrees to discharge.  Patient goals were met. Patient is being discharged due to meeting the stated rehab goals.  ?????        Problem List Patient Active Problem List   Diagnosis Date Noted  . Encephalopathy 08/01/2014  . Migraine 07/31/2014  . 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 Orthostatic hypotension 06/03/2014  . 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 Cerebral thrombosis with cerebral infarction 05/20/2014  . Right sided weakness 05/19/2014  . Stroke   . DM type 2 with diabetic peripheral neuropathy 04/09/2014  . Cerebral infarction due to thrombosis of cerebral artery 03/27/2014  . Essential hypertension, benign 03/27/2014  . HLD (hyperlipidemia) 03/27/2014  . Type II or unspecified type diabetes mellitus with peripheral circulatory disorders, uncontrolled(250.72) 02/12/2014  . Tobacco abuse 02/12/2014  . CVA (cerebral infarction) 02/11/2014  . Syncope 01/25/2014  . Hip pain 01/25/2014  . SAH (subarachnoid hemorrhage) 01/25/2014  . Vision, loss, sudden 01/25/2014  . Type II or unspecified type diabetes mellitus without mention of complication, uncontrolled 01/25/2014    [facility], SLP 09/04/2014, 12:26 PM  [facility] [address] [place], [place], [address] Phone: [phone]   Fax:  [phone]

## 2014-09-22 ENCOUNTER — Emergency Department (HOSPITAL_COMMUNITY): Payer: Medicaid Other

## 2014-09-22 ENCOUNTER — Encounter (HOSPITAL_COMMUNITY): Payer: Self-pay

## 2014-09-22 ENCOUNTER — Emergency Department (HOSPITAL_COMMUNITY)
Admission: EM | Admit: 2014-09-22 | Discharge: 2014-09-22 | Disposition: A | Discharge status: Home / Self Care | Payer: Medicaid Other | Attending: Emergency Medicine | Admitting: Emergency Medicine

## 2014-09-22 DIAGNOSIS — G441 Vascular headache, not elsewhere classified: Secondary | ICD-10-CM

## 2014-09-22 DIAGNOSIS — R55 Syncope and collapse: Secondary | ICD-10-CM | POA: Insufficient documentation

## 2014-09-22 DIAGNOSIS — F419 Anxiety disorder, unspecified: Secondary | ICD-10-CM | POA: Insufficient documentation

## 2014-09-22 DIAGNOSIS — H5462 Unqualified visual loss, left eye, normal vision right eye: Secondary | ICD-10-CM | POA: Diagnosis not present

## 2014-09-22 DIAGNOSIS — J45909 Unspecified asthma, uncomplicated: Secondary | ICD-10-CM | POA: Diagnosis not present

## 2014-09-22 DIAGNOSIS — Z7982 Long term (current) use of aspirin: Secondary | ICD-10-CM | POA: Diagnosis not present

## 2014-09-22 DIAGNOSIS — Z720 Tobacco use: Secondary | ICD-10-CM | POA: Insufficient documentation

## 2014-09-22 DIAGNOSIS — F329 Major depressive disorder, single episode, unspecified: Secondary | ICD-10-CM | POA: Diagnosis not present

## 2014-09-22 DIAGNOSIS — Z79899 Other long term (current) drug therapy: Secondary | ICD-10-CM | POA: Insufficient documentation

## 2014-09-22 DIAGNOSIS — Z880 Allergy status to penicillin: Secondary | ICD-10-CM | POA: Insufficient documentation

## 2014-09-22 DIAGNOSIS — Z8673 Personal history of transient ischemic attack (TIA), and cerebral infarction without residual deficits: Secondary | ICD-10-CM | POA: Diagnosis not present

## 2014-09-22 DIAGNOSIS — E119 Type 2 diabetes mellitus without complications: Secondary | ICD-10-CM | POA: Diagnosis not present

## 2014-09-22 DIAGNOSIS — E780 Pure hypercholesterolemia: Secondary | ICD-10-CM | POA: Insufficient documentation

## 2014-09-22 DIAGNOSIS — R569 Unspecified convulsions: Secondary | ICD-10-CM | POA: Diagnosis not present

## 2014-09-22 DIAGNOSIS — G40909 Epilepsy, unspecified, not intractable, without status epilepticus: Secondary | ICD-10-CM | POA: Insufficient documentation

## 2014-09-22 DIAGNOSIS — H5711 Ocular pain, right eye: Secondary | ICD-10-CM | POA: Diagnosis present

## 2014-09-22 LAB — I-STAT CHEM 8, ED
BUN: 10 mg/dL (ref 6–23)
CREATININE: 0.8 mg/dL (ref 0.50–1.35)
Calcium, Ion: 1.2 mmol/L (ref 1.12–1.23)
Chloride: 106 mmol/L (ref 96–112)
Glucose, Bld: 104 mg/dL — ABNORMAL HIGH (ref 70–99)
HEMATOCRIT: 44 % (ref 39.0–52.0)
HEMOGLOBIN: 15 g/dL (ref 13.0–17.0)
Potassium: 3.4 mmol/L — ABNORMAL LOW (ref 3.5–5.1)
Sodium: 142 mmol/L (ref 135–145)
TCO2: 17 mmol/L (ref 0–100)

## 2014-09-22 LAB — I-STAT TROPONIN, ED: Troponin i, poc: 0 ng/mL (ref 0.00–0.08)

## 2014-09-22 LAB — CBC
HCT: 42.1 % (ref 39.0–52.0)
Hemoglobin: 14.6 g/dL (ref 13.0–17.0)
MCH: 31.7 pg (ref 26.0–34.0)
MCHC: 34.7 g/dL (ref 30.0–36.0)
MCV: 91.5 fL (ref 78.0–100.0)
PLATELETS: 125 10*3/uL — AB (ref 150–400)
RBC: 4.6 MIL/uL (ref 4.22–5.81)
RDW: 12.8 % (ref 11.5–15.5)
WBC: 8.1 10*3/uL (ref 4.0–10.5)

## 2014-09-22 MED ORDER — TOPIRAMATE 25 MG PO TABS: 75.0000 mg | ORAL_TABLET | Freq: Two times a day (BID) | ORAL | Status: DC

## 2014-09-22 MED ORDER — MORPHINE SULFATE 4 MG/ML IJ SOLN
4.0000 mg | Freq: Once | INTRAMUSCULAR | Status: AC
  Administered 2014-09-22: 4 mg via INTRAVENOUS
  Filled 2014-09-22: qty 1

## 2014-09-22 MED ORDER — HYDROCODONE-ACETAMINOPHEN 5-325 MG PO TABS
2.0000 | ORAL_TABLET | Freq: Once | ORAL | Status: AC
  Administered 2014-09-22: 2 via ORAL
  Filled 2014-09-22: qty 2

## 2014-09-22 MED ORDER — FLUORESCEIN SODIUM 1 MG OP STRP: 1.0000 | ORAL_STRIP | Freq: Once | OPHTHALMIC | Status: DC

## 2014-09-22 MED ORDER — TETRACAINE HCL 0.5 % OP SOLN
1.0000 [drp] | Freq: Once | OPHTHALMIC | Status: AC
  Administered 2014-09-22: 1 [drp] via OPHTHALMIC
  Filled 2014-09-22: qty 2

## 2014-09-22 NOTE — ED Notes (Signed)
PER EMS: pt from home with complaint of right eye pain that began around 1800 this evening with dizziness. Denies trauma. Neurologically intact. Pts wife told ems she witnessed seizure like activity on only the left side of his body but ems reports they have seen no seizure like activity. Pt blind in left eye and 65% vision loss in right eye and the vision is more blurry than usual. Denies numbness or tingling anywhere on his body. VS: BP-136/90, HR-79 NSR, RR-16, 97% RA, CBG-115

## 2014-09-22 NOTE — Consult Note (Signed)
Reason for Consult:Headache and seizure Referring Physician: Hyacinth Meeker  CC: Headache and seizure  HPI: Miguel Diaz. is an 44 y.o. male with a history of stroke and left sided symptoms.  It seems that tonight he bent over to get something off the floor, when he stood up he had severe right eye pain followed by feeling very lightheaded.  He then went to sit down on the couch and when he got up he again was lightheaded.  He sat back down and had a syncopal episode while sitting on the couch followed by left-sided tonic-clonic activity of the left arm and left leg during which time he was unresponsive. This lasted approximately 1-2 minutes, resolved spontaneously, he was then gradually and progressively more awake and alert. When paramedics arrived the patient was unable to ambulate, he was assisted to the stretcher and transported for evaluation. He still complains of pain behind the right eye, denies history of seizures.  The patient has had episodes of headache previously associated with left sided complaints.  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    Past Medical History  Diagnosis Date  . Diabetes mellitus without complication   . Neuropathy   . Asthma   . Vision loss of left eye   . Blindness     bilateral  . Hypercholesteremia   . Depression   . Anxiety   . Stroke 02/2014    right sided weakness/numbness    Past Surgical History  Procedure Laterality Date  . Hernia repair    . Eye surgery Left   . Abdominal surgery      Family History  Problem Relation Age of Onset  . High blood pressure Mother   . High blood pressure Sister   . High blood pressure Paternal Grandfather     Social History:  reports that he has been smoking Cigarettes.  He has been smoking about 1.00 pack per day. He has never used smokeless tobacco. He reports that he does not drink alcohol or use illicit drugs.  Allergies  Allergen Reactions  .  Penicillins Other (See Comments)    Unknown childhood reaction  . Toradol [Ketorolac Tromethamine] Hives    Medications: I have reviewed the patient's current medications. Prior to Admission:  Current outpatient prescriptions:  .  albuterol (PROVENTIL HFA;VENTOLIN HFA) 108 (90 BASE) MCG/ACT inhaler, Inhale 2 puffs into the lungs every 6 (six) hours as needed for wheezing or shortness of breath., Disp: , Rfl:  .  aspirin 325 MG tablet, Take 1 tablet (325 mg total) by mouth daily. (Patient taking differently: Take 325 mg by mouth at bedtime. ), Disp: 30 tablet, Rfl: 0 .  atorvastatin (LIPITOR) 40 MG tablet, Take 40 mg by mouth at bedtime. , Disp: , Rfl:  .  FLUoxetine (PROZAC) 40 MG capsule, Take 40 mg by mouth daily., Disp: , Rfl:  .  glipiZIDE (GLUCOTROL XL) 10 MG 24 hr tablet, Take 10 mg by mouth daily with breakfast., Disp: , Rfl:  .  ibuprofen (ADVIL,MOTRIN) 600 MG tablet, Take 600 mg by mouth every 6 (six) hours as needed for moderate pain. , Disp: , Rfl:  .  metFORMIN (GLUCOPHAGE-XR) 500 MG 24 hr tablet, Take 1,000 mg by mouth 2 (two) times daily., Disp: , Rfl:  .  Multiple Vitamins-Minerals (MULTIVITAMIN WITH MINERALS) tablet, Take 1 tablet by mouth daily., Disp: , Rfl:  .  pregabalin (LYRICA) 150 MG capsule, Take 150 mg by mouth  2 (two) times daily., Disp: , Rfl:  .  topiramate (TOPAMAX) 50 MG tablet, Take 1 tablet (50 mg total) by mouth 2 (two) times daily., Disp: 60 tablet, Rfl: 2 .  topiramate (TOPAMAX) 25 MG tablet, Take 3 tablets (75 mg total) by mouth 2 (two) times daily., Disp: 60 tablet, Rfl: 1  ROS: History obtained from the patient  General ROS: negative for - chills, fatigue, fever, night sweats, weight gain or weight loss Psychological ROS: negative for - behavioral disorder, hallucinations, memory difficulties, mood swings or suicidal ideation Ophthalmic ROS: poor vision ENT ROS: negative for - epistaxis, nasal discharge, oral lesions, sore throat, tinnitus or  vertigo Allergy and Immunology ROS: negative for - hives or itchy/watery eyes Hematological and Lymphatic ROS: negative for - bleeding problems, bruising or swollen lymph nodes Endocrine ROS: negative for - galactorrhea, hair pattern changes, polydipsia/polyuria or temperature intolerance Respiratory ROS: negative for - cough, hemoptysis, shortness of breath or wheezing Cardiovascular ROS: negative for - chest pain, dyspnea on exertion, edema or irregular heartbeat Gastrointestinal ROS: negative for - abdominal pain, diarrhea, hematemesis, nausea/vomiting or stool incontinence Genito-Urinary ROS: negative for - dysuria, hematuria, incontinence or urinary frequency/urgency Musculoskeletal ROS: right leg pain Neurological ROS: as noted in HPI Dermatological ROS: negative for rash and skin lesion changes  Physical Examination: Blood pressure 104/74, pulse 74, temperature 98.5 F (36.9 C), temperature source Oral, resp. rate 15, height 5\' 8"  (1.727 m), weight 89.812 kg (198 lb), SpO2 94 %.  HEENT-  Normocephalic, no lesions, without obvious abnormality.  Normal external eye and conjunctiva.  Normal TM's bilaterally.  Normal auditory canals and external ears. Normal external nose, mucus membranes and septum.  Normal pharynx. Cardiovascular- S1, S2 normal, pulses palpable throughout   Lungs- chest clear, no wheezing, rales, normal symmetric air entry Abdomen- soft, non-tender; bowel sounds normal; no masses,  no organomegaly Extremities- no edema Lymph-no adenopathy palpable Musculoskeletal-no joint tenderness, deformity or swelling Skin-warm and dry, no hyperpigmentation, vitiligo, or suspicious lesions  Neurological Examination Mental Status: Alert, oriented, thought content appropriate.  Speech fluent without evidence of aphasia.  Able to follow 3 step commands without difficulty. Cranial Nerves: II: Discs flat bilaterally; Unable to see from the left eye.  Light discernment from the right  without corrective lens.  Left pupil unreactive III,IV, VI: ptosis not present, extra-ocular motions intact bilaterally V,VII: smile symmetric, facial light touch sensation normal bilaterally VIII: hearing normal bilaterally IX,X: gag reflex present XI: bilateral shoulder shrug XII: midline tongue extension Motor: Right : Upper extremity   5/5    Left:     Upper extremity   5/5  Lower extremity   5/5     Lower extremity   5/5 Tone and bulk:normal tone throughout; no atrophy noted Sensory: Pinprick and light touch intact throughout, bilaterally Deep Tendon Reflexes: 2+ and symmetric with absent AJ's bilaterally Plantars: Right: downgoing   Left: downgoing Cerebellar: normal finger-to-nose and normal heel-to-shin testing bilaterally   Laboratory Studies:   Basic Metabolic Panel:  Recent Labs Lab 09/22/14 2022  NA 142  K 3.4*  CL 106  GLUCOSE 104*  BUN 10  CREATININE 0.80    Liver Function Tests: No results for input(s): AST, ALT, ALKPHOS, BILITOT, PROT, ALBUMIN in the last 168 hours. No results for input(s): LIPASE, AMYLASE in the last 168 hours. No results for input(s): AMMONIA in the last 168 hours.  CBC:  Recent Labs Lab 09/22/14 2015 09/22/14 2022  WBC 8.1  --   HGB 14.6 15.0  HCT 42.1 44.0  MCV 91.5  --   PLT 125*  --     Cardiac Enzymes: No results for input(s): CKTOTAL, CKMB, CKMBINDEX, TROPONINI in the last 168 hours.  BNP: Invalid input(s): POCBNP  CBG: No results for input(s): GLUCAP in the last 168 hours.  Microbiology: Results for orders placed or performed during the hospital encounter of 07/05/14  Urine culture     Status: None   Collection Time: 07/06/14  2:10 AM  Result Value Ref Range Status   Specimen Description URINE, CLEAN CATCH  Final   Special Requests Normal  Final   Colony Count NO GROWTH Performed at [facility]   Final   Culture NO GROWTH Performed at [facility]   Final   Report Status 07/07/2014  FINAL  Final    Coagulation Studies: No results for input(s): LABPROT, INR in the last 72 hours.  Urinalysis: No results for input(s): COLORURINE, LABSPEC, PHURINE, GLUCOSEU, HGBUR, BILIRUBINUR, KETONESUR, PROTEINUR, UROBILINOGEN, NITRITE, LEUKOCYTESUR in the last 168 hours.  Invalid input(s): APPERANCEUR  Lipid Panel:     Component Value Date/Time   CHOL 100 05/20/2014 0434   TRIG 70 05/20/2014 0434   HDL 31* 05/20/2014 0434   CHOLHDL 3.2 05/20/2014 0434   VLDL 14 05/20/2014 0434   LDLCALC 55 05/20/2014 0434    HgbA1C:  Lab Results  Component Value Date   HGBA1C 7.5* 05/20/2014    Urine Drug Screen:     Component Value Date/Time   LABOPIA NONE DETECTED 04/08/2014 1620   COCAINSCRNUR NONE DETECTED 04/08/2014 1620   LABBENZ NONE DETECTED 04/08/2014 1620   AMPHETMU NONE DETECTED 04/08/2014 1620   THCU NONE DETECTED 04/08/2014 1620   LABBARB NONE DETECTED 04/08/2014 1620    Alcohol Level: No results for input(s): ETH in the last 168 hours.  Other results: EKG: sinus rhythm at 87 bpm.  Imaging: Ct Head Wo Contrast  09/22/2014   CLINICAL DATA:  Acute onset of seizure. Dizziness and headache. Initial encounter.  EXAM: CT HEAD WITHOUT CONTRAST  TECHNIQUE: Contiguous axial images were obtained from the base of the skull through the vertex without intravenous contrast.  COMPARISON:  CT of the head performed 07/05/2014  FINDINGS: There is no evidence of acute infarction, mass lesion, or intra- or extra-axial hemorrhage on CT.  Prominence of the occipital horns of the lateral ventricles remains stable and likely reflects a normal variant.  The posterior fossa, including the cerebellum, brainstem and fourth ventricle, is within normal limits. The third and lateral ventricles, and basal ganglia are unremarkable in appearance. The cerebral hemispheres are symmetric in appearance, with normal gray-white differentiation. No mass effect or midline shift is seen.  There is no evidence of  fracture; small bilateral soft tissue lipomas are again noted overlying the parietal calvarium bilaterally, with mild bony remodeling on the left, stable in appearance. The orbits are within normal limits. Mucus retention cysts or polyps are seen at the maxillary sinuses bilaterally. The remaining paranasal sinuses and mastoid air cells are well-aerated. No significant soft tissue abnormalities are seen.  IMPRESSION: 1. No acute intracranial pathology seen on CT. 2. Prominence of the occipital horns of the lateral ventricles remains stable and likely reflects a normal variant. 3. Mucus retention cysts or polyps at the maxillary sinuses bilaterally. 4. Small bilateral soft tissue lipomas again noted overlying the parietal calvarium bilaterally, with mild bony remodeling on the left, stable in appearance.   Electronically Signed   By: Beryle Beams.D.  On: 09/22/2014 20:43   Mr Brain Wo Contrast  09/22/2014   CLINICAL DATA:  Question of seizure-like activity.  Right eye pain.  EXAM: [name] HEAD WITHOUT CONTRAST  TECHNIQUE: Multiplanar, multiecho pulse sequences of the brain and surrounding structures were obtained without intravenous contrast.  COMPARISON:  05/20/2014  FINDINGS: Calvarium and upper cervical spine: No marrow signal abnormality.  Incidental bilateral low parietal scalp lipomas.  Orbits: Not well evaluated due to motion on T2 weighted imaging. Aspherical globes consistent with staphyloma.  Sinuses: Mild mucosal thickening and left maxillary mucous retention cyst. No sinus effusion.  Brain: No acute infarct, hemorrhage, hydrocephalus, or mass lesion. No evidence of large vessel occlusion. There is stable moderate chronic small vessel disease with patchy T2 and FLAIR hyperintensity throughout the bilateral deep and subcortical cerebral white matter. Stable asymmetric gliotic signal around the posterior right lateral ventricle. The occipital horns and atria of the lateral ventricles remain mildly  prominent, without scalloped margins.  IMPRESSION: 1. No acute intracranial findings. 2. Moderate chronic small vessel disease.   Electronically Signed   By: Marnee SpringJonathon  Watts M.D.   On: 09/22/2014 21:46     Assessment/Plan: 44 year old male presenting with headache and left clonic activity.  Clonic activity was short-lived but headache remains.  [name] of the brain personally reviewed and shows no acute changes.  Patient on Lyrica and Topamax prior to presentation.   Recommendations: 1.  Headache cocktail for pain 2.  Increase Topamax to 75mg  BID 3.  Patient to follow up with Dr. Roda ShuttersXu as an outpatient   4.  EEG as an outpatient  Thana FarrLeslie Keira Bohlin, MD [facility] [phone] 09/22/2014, 10:42 PM

## 2014-09-22 NOTE — Discharge Instructions (Signed)
The Neurologist has recommended that you start taking Topamax 75mg  by mouth twice daily.  Hydrocodone for headache.  Please call your doctor for a followup appointment within 24-48 hours. When you talk to your doctor please let them know that you were seen in the emergency department and have them acquire all of your records so that they can discuss the findings with you and formulate a treatment plan to fully care for your new and ongoing problems.

## 2014-09-22 NOTE — ED Provider Notes (Signed)
CSN: [id number]     Arrival date & time 09/22/14  1900 History   First MD Initiated Contact with Patient 09/22/14 1903     Chief Complaint  Patient presents with  . Eye Pain     (Consider location/radiation/quality/duration/timing/severity/associated sxs/prior Treatment) HPI Comments: The patient is a 44 year old male, he has a comp located medical history including diabetes, neuropathy, left eye vision loss, stroke and  Hypercholesterolemia.  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 His significant other states that tonight he bent over to get something off the floor, when he stood up he had severe right eye pain followed by feeling very lightheaded, he had a syncopal episode while sitting on the couch followed by left-sided tonic-clonic activity of the left arm and left leg during which time he was unresponsive. This lasted approximately 1-2 minutes, resolved spontaneously, he was then gradually and progressively more awake and alert. When paramedics arrived the patient was unable to ambulate, he was assisted to the stretcher and transported for evaluation. He still complains of pain behind the right eye, denies history of seizures.   Patient is a 44 y.o. male presenting with eye pain. The history is provided by the patient.  Eye Pain    Past Medical History  Diagnosis Date  . Diabetes mellitus without complication   . Neuropathy   . Asthma   . Vision loss of left eye   . Blindness     bilateral  . Hypercholesteremia   . Depression   . Anxiety   . Stroke 02/2014    right sided weakness/numbness   Past Surgical History  Procedure Laterality Date  . Hernia repair    . Eye surgery Left   . Abdominal surgery     Family History  Problem Relation Age of Onset  . High blood pressure  Mother   . High blood pressure Sister   . High blood pressure Paternal Grandfather    History  Substance Use Topics  . Smoking status: Current Every Day Smoker -- 1.00 packs/day    Types: Cigarettes  . Smokeless tobacco: Never Used  . Alcohol Use: No     Comment: Ocassional drinker    Review of Systems  Eyes: Positive for pain.  All other systems reviewed and are negative.     Allergies  Penicillins and Toradol  Home Medications   Prior to Admission medications   Medication Sig Start Date End Date Taking? Authorizing Provider  albuterol (PROVENTIL HFA;VENTOLIN HFA) 108 (90 BASE) MCG/ACT inhaler Inhale 2 puffs into the lungs every 6 (six) hours as needed for wheezing or shortness of breath.   Yes Historical Provider, MD  aspirin 325 MG tablet Take 1 tablet (325 mg total) by mouth daily. Patient taking differently: Take 325 mg by mouth at bedtime.  05/21/14  Yes Joseph ArtJessica U Vann, DO  atorvastatin (LIPITOR) 40 MG tablet Take 40 mg by mouth at bedtime.    Yes Historical Provider, MD  FLUoxetine (PROZAC) 40 MG capsule Take 40 mg by mouth daily.   Yes Historical Provider, MD  glipiZIDE (GLUCOTROL XL) 10 MG 24 hr tablet Take 10 mg by mouth daily with breakfast.   Yes Historical Provider, MD  ibuprofen (ADVIL,MOTRIN) 600 MG tablet Take 600 mg by mouth every 6 (six) hours as  needed for moderate pain.    Yes Historical Provider, MD  metFORMIN (GLUCOPHAGE-XR) 500 MG 24 hr tablet Take 1,000 mg by mouth 2 (two) times daily.   Yes Historical Provider, MD  Multiple Vitamins-Minerals (MULTIVITAMIN WITH MINERALS) tablet Take 1 tablet by mouth daily.   Yes Historical Provider, MD  pregabalin (LYRICA) 150 MG capsule Take 150 mg by mouth 2 (two) times daily.   Yes Historical Provider, MD  topiramate (TOPAMAX) 50 MG tablet Take 1 tablet (50 mg total) by mouth 2 (two) times daily. 07/31/14  Yes Marvel Plan, MD  topiramate (TOPAMAX) 25 MG tablet Take 3 tablets (75 mg total) by mouth 2 (two) times daily.  09/22/14   Eber Hong, MD   BP 104/74 mmHg  Pulse 74  Temp(Src) 98.5 F (36.9 C) (Oral)  Resp 15  Ht  (1.727 m)  Wt 198 lb (89.812 kg)  BMI 30.11 kg/m2  SpO2 94% Physical Exam  Constitutional: He appears well-developed and well-nourished. No distress.  HENT:  Head: Normocephalic and atraumatic.  Mouth/Throat: Oropharynx is clear and moist. No oropharyngeal exudate.  Eyes: Conjunctivae and EOM are normal. Pupils are equal, round, and reactive to light. Right eye exhibits no discharge. Left eye exhibits no discharge. No scleral icterus.  The pupils are equal round and reactive to light bilaterally, he has no vision in the left eye, significantly decreased vision in the right eye, he wears glasses that are very very thick. His conjunctiva are clear. Poorly visualized retina bilaterally  Neck: Normal range of motion. Neck supple. No JVD present. No thyromegaly present.  Cardiovascular: Normal rate, regular rhythm, normal heart sounds and intact distal pulses.  Exam reveals no gallop and no friction rub.   No murmur heard. Pulmonary/Chest: Effort normal and breath sounds normal. No respiratory distress. He has no wheezes. He has no rales.  Abdominal: Soft. Bowel sounds are normal. He exhibits no distension and no mass. There is no tenderness.  Musculoskeletal: Normal range of motion. He exhibits no edema or tenderness.  Lymphadenopathy:    He has no cervical adenopathy.  Neurological: He is alert. Coordination normal.  Normal strength and sensation in all 4 extremities, cranial nerves III through XII are normal, speech is clear, answers questions appropriately, reflexes are intact at the bilateral patellar tendons. Vision is significantly decreased on the right, no vision in the left eye  Skin: Skin is warm and dry. No rash noted. No erythema.  Psychiatric: He has a normal mood and affect. His behavior is normal.  Nursing note and vitals reviewed.   ED Course  Procedures (including  critical care time) Labs Review Labs Reviewed  CBC - Abnormal; Notable for the following:    Platelets 125 (*)    All other components within normal limits  I-STAT CHEM 8, ED - Abnormal; Notable for the following:    Potassium 3.4 (*)    Glucose, Bld 104 (*)    All other components within normal limits  I-STAT TROPOININ, ED    Imaging Review Ct Head Wo Contrast  09/22/2014   CLINICAL DATA:  Acute onset of seizure. Dizziness and headache. Initial encounter.  EXAM: CT HEAD WITHOUT CONTRAST  TECHNIQUE: Contiguous axial images were obtained from the base of the skull through the vertex without intravenous contrast.  COMPARISON:  CT of the head performed 07/05/2014  FINDINGS: There is no evidence of acute infarction, mass lesion, or intra- or extra-axial hemorrhage on CT.  Prominence of the occipital horns of the lateral ventricles  remains stable and likely reflects a normal variant.  The posterior fossa, including the cerebellum, brainstem and fourth ventricle, is within normal limits. The third and lateral ventricles, and basal ganglia are unremarkable in appearance. The cerebral hemispheres are symmetric in appearance, with normal gray-white differentiation. No mass effect or midline shift is seen.  There is no evidence of fracture; small bilateral soft tissue lipomas are again noted overlying the parietal calvarium bilaterally, with mild bony remodeling on the left, stable in appearance. The orbits are within normal limits. Mucus retention cysts or polyps are seen at the maxillary sinuses bilaterally. The remaining paranasal sinuses and mastoid air cells are well-aerated. No significant soft tissue abnormalities are seen.  IMPRESSION: 1. No acute intracranial pathology seen on CT. 2. Prominence of the occipital horns of the lateral ventricles remains stable and likely reflects a normal variant. 3. Mucus retention cysts or polyps at the maxillary sinuses bilaterally. 4. Small bilateral soft tissue  lipomas again noted overlying the parietal calvarium bilaterally, with mild bony remodeling on the left, stable in appearance.   Electronically Signed   By: Roanna Raider M.D.   On: 09/22/2014 20:43   Mr Brain Wo Contrast  09/22/2014   CLINICAL DATA:  Question of seizure-like activity.  Right eye pain.  EXAM: [name] HEAD WITHOUT CONTRAST  TECHNIQUE: Multiplanar, multiecho pulse sequences of the brain and surrounding structures were obtained without intravenous contrast.  COMPARISON:  05/20/2014  FINDINGS: Calvarium and upper cervical spine: No marrow signal abnormality.  Incidental bilateral low parietal scalp lipomas.  Orbits: Not well evaluated due to motion on T2 weighted imaging. Aspherical globes consistent with staphyloma.  Sinuses: Mild mucosal thickening and left maxillary mucous retention cyst. No sinus effusion.  Brain: No acute infarct, hemorrhage, hydrocephalus, or mass lesion. No evidence of large vessel occlusion. There is stable moderate chronic small vessel disease with patchy T2 and FLAIR hyperintensity throughout the bilateral deep and subcortical cerebral white matter. Stable asymmetric gliotic signal around the posterior right lateral ventricle. The occipital horns and atria of the lateral ventricles remain mildly prominent, without scalloped margins.  IMPRESSION: 1. No acute intracranial findings. 2. Moderate chronic small vessel disease.   Electronically Signed   By: Marnee Spring M.D.   On: 09/22/2014 21:46     EKG Interpretation   Date/Time:  Sunday September 22 2014 20:03:15 EDT Ventricular Rate:  87 PR Interval:  125 QRS Duration: 82 QT Interval:  357 QTC Calculation: 429 R Axis:   86 Text Interpretation:  Sinus rhythm Borderline repolarization abnormality T  wave abnormality since last tracing no significant change Abnormal ekg  Confirmed by Christasia Angeletti  MD, Aloria Looper ([id number]) on 09/22/2014 8:06:54 PM      MDM   Final diagnoses:  Syncope  Seizure    The patient has  significantly abnormal symptoms this evening. His neurologic exam other than his vision being poor does not seem to be outside of his normal based on prior notes, he is now claiming that he is having chest pain radiating to his neck, he had a negative stress test within the last 8-10 months.  D/w Dr. Thad Ranger - recommends [name] and then Keppra given witnessed focal seizures and prior stroke lower seizure threshold.  Patient stable for discharge on following medications   Meds given in ED:  Medications  tetracaine (PONTOCAINE) 0.5 % ophthalmic solution 1 drop (not administered)  HYDROcodone-acetaminophen (NORCO/VICODIN) 5-325 MG per tablet 2 tablet (not administered)  morphine 4 MG/ML injection 4 mg (4 mg Intravenous  Given 09/22/14 1953)    New Prescriptions   TOPIRAMATE (TOPAMAX) 25 MG TABLET    Take 3 tablets (75 mg total) by mouth 2 (two) times daily.      Eber Hong, MD 09/22/14 [phone]

## 2014-09-27 ENCOUNTER — Emergency Department (HOSPITAL_COMMUNITY): Payer: Medicaid Other

## 2014-09-27 ENCOUNTER — Emergency Department (HOSPITAL_COMMUNITY)
Admission: EM | Admit: 2014-09-27 | Discharge: 2014-09-27 | Disposition: A | Discharge status: Home / Self Care | Payer: Medicaid Other | Attending: Emergency Medicine | Admitting: Emergency Medicine

## 2014-09-27 DIAGNOSIS — Z79899 Other long term (current) drug therapy: Secondary | ICD-10-CM | POA: Insufficient documentation

## 2014-09-27 DIAGNOSIS — Z880 Allergy status to penicillin: Secondary | ICD-10-CM | POA: Insufficient documentation

## 2014-09-27 DIAGNOSIS — E780 Pure hypercholesterolemia: Secondary | ICD-10-CM | POA: Insufficient documentation

## 2014-09-27 DIAGNOSIS — Z8673 Personal history of transient ischemic attack (TIA), and cerebral infarction without residual deficits: Secondary | ICD-10-CM | POA: Insufficient documentation

## 2014-09-27 DIAGNOSIS — M6281 Muscle weakness (generalized): Secondary | ICD-10-CM | POA: Diagnosis not present

## 2014-09-27 DIAGNOSIS — F329 Major depressive disorder, single episode, unspecified: Secondary | ICD-10-CM | POA: Diagnosis not present

## 2014-09-27 DIAGNOSIS — G588 Other specified mononeuropathies: Secondary | ICD-10-CM | POA: Diagnosis not present

## 2014-09-27 DIAGNOSIS — Z7982 Long term (current) use of aspirin: Secondary | ICD-10-CM | POA: Insufficient documentation

## 2014-09-27 DIAGNOSIS — E119 Type 2 diabetes mellitus without complications: Secondary | ICD-10-CM | POA: Diagnosis not present

## 2014-09-27 DIAGNOSIS — Z720 Tobacco use: Secondary | ICD-10-CM | POA: Insufficient documentation

## 2014-09-27 DIAGNOSIS — J45909 Unspecified asthma, uncomplicated: Secondary | ICD-10-CM | POA: Diagnosis not present

## 2014-09-27 DIAGNOSIS — G629 Polyneuropathy, unspecified: Secondary | ICD-10-CM | POA: Insufficient documentation

## 2014-09-27 DIAGNOSIS — H540 Blindness, both eyes: Secondary | ICD-10-CM | POA: Diagnosis not present

## 2014-09-27 DIAGNOSIS — R079 Chest pain, unspecified: Secondary | ICD-10-CM | POA: Insufficient documentation

## 2014-09-27 DIAGNOSIS — F419 Anxiety disorder, unspecified: Secondary | ICD-10-CM | POA: Diagnosis not present

## 2014-09-27 DIAGNOSIS — R29898 Other symptoms and signs involving the musculoskeletal system: Secondary | ICD-10-CM

## 2014-09-27 LAB — CBC
HCT: 42.2 % (ref 39.0–52.0)
HEMOGLOBIN: 14.8 g/dL (ref 13.0–17.0)
MCH: 31.4 pg (ref 26.0–34.0)
MCHC: 35.1 g/dL (ref 30.0–36.0)
MCV: 89.6 fL (ref 78.0–100.0)
Platelets: 130 10*3/uL — ABNORMAL LOW (ref 150–400)
RBC: 4.71 MIL/uL (ref 4.22–5.81)
RDW: 12.8 % (ref 11.5–15.5)
WBC: 7.3 10*3/uL (ref 4.0–10.5)

## 2014-09-27 LAB — I-STAT TROPONIN, ED
TROPONIN I, POC: 0 ng/mL (ref 0.00–0.08)
Troponin i, poc: 0 ng/mL (ref 0.00–0.08)

## 2014-09-27 LAB — BASIC METABOLIC PANEL
ANION GAP: 8 (ref 5–15)
BUN: 9 mg/dL (ref 6–23)
CHLORIDE: 111 mmol/L (ref 96–112)
CO2: 21 mmol/L (ref 19–32)
Calcium: 9.2 mg/dL (ref 8.4–10.5)
Creatinine, Ser: 0.84 mg/dL (ref 0.50–1.35)
GFR calc non Af Amer: >90 mL/min (ref >90)
GLUCOSE: 122 mg/dL — AB (ref 70–99)
POTASSIUM: 3.6 mmol/L (ref 3.5–5.1)
SODIUM: 140 mmol/L (ref 135–145)

## 2014-09-27 LAB — TROPONIN I: Troponin I: <0.03 ng/mL (ref <0.031)

## 2014-09-27 LAB — D-DIMER, QUANTITATIVE: D-Dimer, Quant: 0.27 ug/mL-FEU (ref 0.00–0.48)

## 2014-09-27 MED ORDER — MORPHINE SULFATE 4 MG/ML IJ SOLN
4.0000 mg | Freq: Once | INTRAMUSCULAR | Status: AC
  Administered 2014-09-27: 4 mg via INTRAVENOUS
  Filled 2014-09-27: qty 1

## 2014-09-27 MED ORDER — ONDANSETRON HCL 4 MG/2ML IJ SOLN
4.0000 mg | Freq: Once | INTRAMUSCULAR | Status: AC
  Administered 2014-09-27: 4 mg via INTRAVENOUS
  Filled 2014-09-27: qty 2

## 2014-09-27 NOTE — ED Provider Notes (Signed)
CSN: [id number]     Arrival date & time 09/27/14  1514 History   First MD Initiated Contact with Patient 09/27/14 1521     Chief Complaint  Patient presents with  . Chest Pain     (Consider location/radiation/quality/duration/timing/severity/associated sxs/prior Treatment) Patient is a 44 y.o. male presenting with chest pain. The history is provided by the patient.  Chest Pain Pain location:  L chest Pain quality: sharp   Pain radiates to:  L arm Pain radiates to the back: no   Pain severity:  Moderate Onset quality:  Sudden Timing:  Constant Progression:  Improving Chronicity:  New Context: at rest   Context comment:  Had just sat down after being outside and crushing cans by stepping on them Relieved by:  Nothing Worsened by:  Nothing tried Associated symptoms: shortness of breath (when pain began, not now)   Associated symptoms: no abdominal pain, no cough, no fever, no lower extremity edema, no nausea and not vomiting     Past Medical History  Diagnosis Date  . Diabetes mellitus without complication   . Neuropathy   . Asthma   . Vision loss of left eye   . Blindness     bilateral  . Hypercholesteremia   . Depression   . Anxiety   . Stroke 02/2014    right sided weakness/numbness   Past Surgical History  Procedure Laterality Date  . Hernia repair    . Eye surgery Left   . Abdominal surgery     Family History  Problem Relation Age of Onset  . High blood pressure Mother   . High blood pressure Sister   . High blood pressure Paternal Grandfather    History  Substance Use Topics  . Smoking status: Current Every Day Smoker -- 1.00 packs/day    Types: Cigarettes  . Smokeless tobacco: Never Used  . Alcohol Use: No     Comment: Ocassional drinker    Review of Systems  Constitutional: Negative for fever.  Respiratory: Positive for shortness of breath (when pain began, not now). Negative for cough.   Cardiovascular: Positive for chest pain. Negative for leg  swelling.  Gastrointestinal: Negative for nausea, vomiting and abdominal pain.  All other systems reviewed and are negative.     Allergies  Penicillins and Toradol  Home Medications   Prior to Admission medications   Medication Sig Start Date End Date Taking? Authorizing Provider  albuterol (PROVENTIL HFA;VENTOLIN HFA) 108 (90 BASE) MCG/ACT inhaler Inhale 2 puffs into the lungs every 6 (six) hours as needed for wheezing or shortness of breath.    Historical Provider, MD  aspirin 325 MG tablet Take 1 tablet (325 mg total) by mouth daily. Patient taking differently: Take 325 mg by mouth at bedtime.  05/21/14   Joseph Art, DO  atorvastatin (LIPITOR) 40 MG tablet Take 40 mg by mouth at bedtime.     Historical Provider, MD  FLUoxetine (PROZAC) 40 MG capsule Take 40 mg by mouth daily.    Historical Provider, MD  glipiZIDE (GLUCOTROL XL) 10 MG 24 hr tablet Take 10 mg by mouth daily with breakfast.    Historical Provider, MD  ibuprofen (ADVIL,MOTRIN) 600 MG tablet Take 600 mg by mouth every 6 (six) hours as needed for moderate pain.     Historical Provider, MD  metFORMIN (GLUCOPHAGE-XR) 500 MG 24 hr tablet Take 1,000 mg by mouth 2 (two) times daily.    Historical Provider, MD  Multiple Vitamins-Minerals (MULTIVITAMIN WITH MINERALS) tablet Take  1 tablet by mouth daily.    Historical Provider, MD  pregabalin (LYRICA) 150 MG capsule Take 150 mg by mouth 2 (two) times daily.    Historical Provider, MD  topiramate (TOPAMAX) 25 MG tablet Take 3 tablets (75 mg total) by mouth 2 (two) times daily. 09/22/14   Eber Hong, MD  topiramate (TOPAMAX) 50 MG tablet Take 1 tablet (50 mg total) by mouth 2 (two) times daily. 07/31/14   Marvel Plan, MD   BP 119/77 mmHg  Pulse 82  Temp(Src) 98.2 F (36.8 C) (Oral)  Resp 16  SpO2 100% Physical Exam  Constitutional: He is oriented to person, place, and time. He appears well-developed and well-nourished. No distress.  HENT:  Head: Normocephalic and  atraumatic.  Mouth/Throat: Oropharynx is clear and moist. No oropharyngeal exudate.  Eyes: EOM are normal. Pupils are equal, round, and reactive to light.  Neck: Normal range of motion. Neck supple.  Cardiovascular: Normal rate and regular rhythm.  Exam reveals no friction rub.   No murmur heard. Pulmonary/Chest: Effort normal and breath sounds normal. No respiratory distress. He has no wheezes. He has no rales. He exhibits tenderness (L chest, which re-creates the pain).  Abdominal: Soft. He exhibits no distension. There is no tenderness. There is no rebound.  Musculoskeletal: Normal range of motion. He exhibits no edema.  Neurological: He is alert and oriented to person, place, and time. A sensory deficit (mild R sided sensroy deficit) is present. No cranial nerve deficit. He exhibits abnormal muscle tone (mild R arm and R leg weakness. Mild R sided pronator drift).  Skin: No rash noted. He is not diaphoretic.  Nursing note and vitals reviewed.   ED Course  Procedures (including critical care time) Labs Review Labs Reviewed  CBC - Abnormal; Notable for the following:    Platelets 130 (*)    All other components within normal limits  BASIC METABOLIC PANEL  TROPONIN I  I-STAT TROPOININ, ED    Imaging Review No results found.   EKG Interpretation   Date/Time:  Friday September 27 2014 15:30:06 EDT Ventricular Rate:  81 PR Interval:  127 QRS Duration: 77 QT Interval:  372 QTC Calculation: 432 R Axis:   76 Text Interpretation:  Sinus rhythm Borderline T abnormalities, diffuse  leads No significant change since last tracing Confirmed by Gwendolyn Grant  MD,  Abril Cappiello ([id number]) on 09/27/2014 3:50:00 PM      MDM   Final diagnoses:  Left sided chest pain  Right arm weakness    44 year old male here with chest pain. Began while he was outside crushing cans with his feet. Once he sat down and take a break, he began having left-sided sharp chest pain that radiated to his left arm. He did note  he had some right-sided weakness prior to the chest pain. This went away. He notes pain is sharp, radiating to the left arm. He's never had this pain before. He does have a history of diabetes, smoking, high cholesterol. On exam he has left-sided chest tenderness. He has pain in his chest and using his left arm. This does reproduce his pain.  With his right-sided weakness, on exam he has mild right arm and leg weakness. He has right-sided pronator drift. He has a history of stroke with this as a prior deficit. He states that he occasionally will have this flareup whenever he has something else going on. I spoke with Dr. Roseanne Reno neurology about him. Since he is having a something else going on and  he's had this deficit before I don't think he needs code stroke criteria. Dr. Roseanne RenoStewart is in agreement. I will [name] and to see if there is any acute stroke at this time. HEART score of 2 here, 2 points for risk factors. Sharp pain, radiating into his arm this is reproducible seems more like musculoskeletal pain here. Initial troponin and d-dimer negative. Serial troponin and [name] are negative. Stable for discharge.  Elwin MochaBlair Elise Gladden, MD 09/27/14 [phone]

## 2014-09-27 NOTE — ED Notes (Signed)
Pt remains in [name] 

## 2014-09-27 NOTE — ED Notes (Signed)
Pt c/o L upper CP that radiates into L arm onset today, pt denies N/v/d, pain worse with movement & inspiration, pt A&O x4, follows commands, speaks in complete sentences, pt rcvd 324 mg ASA pta

## 2014-09-27 NOTE — Discharge Instructions (Signed)

## 2014-09-27 NOTE — ED Notes (Signed)
Pt has right sided weakness as deficit from previous stroke.

## 2014-09-27 NOTE — ED Notes (Signed)
Pt complaining of feeling "jittery" in his chest. NSR @ 82 bpm. Lung sounds clear.

## 2014-10-07 ENCOUNTER — Ambulatory Visit (INDEPENDENT_AMBULATORY_CARE_PROVIDER_SITE_OTHER): Payer: Medicaid Other | Admitting: Neurology

## 2014-10-07 ENCOUNTER — Encounter: Payer: Self-pay | Admitting: Neurology

## 2014-10-07 VITALS — BP 99/67 | HR 89 | Ht 68.0 in | Wt 198.6 lb

## 2014-10-07 DIAGNOSIS — G43409 Hemiplegic migraine, not intractable, without status migrainosus: Secondary | ICD-10-CM

## 2014-10-07 DIAGNOSIS — E1142 Type 2 diabetes mellitus with diabetic polyneuropathy: Secondary | ICD-10-CM

## 2014-10-07 DIAGNOSIS — E1143 Type 2 diabetes mellitus with diabetic autonomic (poly)neuropathy: Secondary | ICD-10-CM | POA: Diagnosis not present

## 2014-10-07 DIAGNOSIS — E785 Hyperlipidemia, unspecified: Secondary | ICD-10-CM | POA: Diagnosis not present

## 2014-10-07 DIAGNOSIS — I951 Orthostatic hypotension: Secondary | ICD-10-CM | POA: Diagnosis not present

## 2014-10-07 DIAGNOSIS — G629 Polyneuropathy, unspecified: Secondary | ICD-10-CM

## 2014-10-07 MED ORDER — TOPIRAMATE 50 MG PO TABS: 50.0000 mg | ORAL_TABLET | Freq: Two times a day (BID) | ORAL | Status: DC

## 2014-10-07 NOTE — Patient Instructions (Signed)
-   continue ASA and lipitor for stroke prevention - decrease topamax back to 50mg  twice a day - you likely to have autonomic neuropathy due to diabetes and that caused you syncope and passing out. You need to be really slow on standing. If you feel symptoms coming, please lying down and check the BP right away.  Follow up with your primary care physician for stroke risk factor modification. Recommend maintain blood pressure goal <130/80, diabetes with hemoglobin A1c goal below 6.5% and lipids with LDL cholesterol goal below 70 mg/dL.  - check BP at home and record. - follow up in 3 months.

## 2014-10-07 NOTE — Progress Notes (Signed)
STROKE NEUROLOGY FOLLOW UP NOTE  NAME: Miguel Diaz. DOB: 1971/03/21  REASON FOR VISIT: stroke follow up HISTORY FROM: wife and chart   Today we had the pleasure of seeing Miguel Diaz. in follow-up at our [facility]rology [facility]. Pt was accompanied by wife.   History Summary Miguel Diaz is a 44 y.o. male with a history of DM, DM neuropathy, HLD, blindness was first admitted in 01/2014 for syncope episode after voiding. He could not remember what happened exactly. Initial workup found a normal CBC, BMP, A1c 8.6, troponin was negative. A CT of c-spine showed no fracture, and CT head showed a 3mm punctate lesion in right posterior fossa concerning for subarachnoid hemorrhage or cerebellar hemorrhage. [name] brain showed no bleed, and the punctate lesion was smaller but present on CT head done in 2013, felt this was consistent with choroid plexus calcification. He had normal orthostatic vital signs and was monitored on tele overnight with no events, repeat EKG continued to show TWI in III and aVF.Carotid dopplers, echocardiogram, and myoview non-concerning per cardiology.    He was again admitted on 02/11/14 for acute onset right-sided numbness and weakness. Wife states that he then became nonverbal and seemed confused but no LOC for a brief period of time, then began improving in transit with EMS. On arrival to ER, he still had some right-sided weakness, but this seemed to improve but he continued to have right-sided numbness. [name] showed questionable punctate acute infarct in the left dorsal brainstem at the junction of pons and midbrain. However, this did not correspond to pt symptoms. LDL 78 and A1C 8.6. He was enrolled in SOCRATES trial and on either ASA or brilinta for stroke prevention. However, he sated that he did have headache after the initial event.  03/27/14 follow up - the patient has been doing stable. He stated that his symptoms of right side weakness and numbness are gone, only  has some bilateral toe numbness which he has before due to DM neuropathy. He stated that his sugar is better, this am 127, more strict diet at home.  He continued to have vision difficulties. He stated that her in 2013, he had left eye vision loss, sudden onset, was told to be due to diabetes or maybe congenital causes. In 01/2014, he passed out and admitted to [facility] for evaluation. He was told to have post-micturition syncope and was discharged after improvement. But wife said since then he had right eye tunnel vision. Has been followed up with eye doctor and etiology not clear.   He was admitted on 04/08/14 due to syncope. He mentions that he was trying to let his dog in the house and the next thing that he remembers was he was surrounded by EMS. He called his wife to get some help as he had fallen down although patient does not remember that. When the wife reached home within a few minutes she found that he was lying down on the floor unresponsive to verbal command. There was no incontinence or seizure-like activity is noted. There was no tongue bite noted. Patient become more alert with arrival of the EMS. EEG was normal.    He was admitted again in 05/2014 with acute onset nonverbal, not feeling right, confusion and right sided numbness/weakness associated with HA. CT showed no acute abnormality and [name] showed no acute stroke. EEG normal, no seizure. He was discharge with ASA (has finished SOCRATES trial).   On 07/05/14, pt was sent to ER  by EMS due to acute onset headache, confusion and right sided heaviness feeling. CT head negative. He was found to have elevated blood glucose at 400s. He was discharged with outpt follow up.   07/31/14 follow up He stated that he has migraine since he was young and for almost 40 years. Usually once a month, lasting 1-2 days. HA happens at the back of head, or bilateral temporal or at the top of head, 8-9/10, does not take medication for HA except sometimes tylenol. His  father had hx of migraine but found out to have aneurysm. He denies migraine in other family members.  He still has vision difficulty and recent visit of ophthalmology and was told no change of visual acuity. 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   Interval History During the interval time, he had ER visit on 09/22/14. He bend over to reach something on the floor and then abruptly stood up. He then had severe right eye pain followed by feeling very lightheaded, had a syncopal episode while sitting on the couch followed by left-sided tonic-clonic activity of the left arm and left leg during which time he was unresponsive. This lasted approximately 1-2 minutes, resolved spontaneously, he was then gradually and progressively more awake and alert. When paramedics arrived the patient was unable to ambulate, he was assisted to the stretcher and transported for evaluation. [name] in ED showed no acute infarct, he was discharged after back to baseline. His SBP in ER was between 104-127. Not sure the BP at the time of onset.  He had again ER visit on 09/27/14. He was outside crushing cans with his feet, started to feel chest pain, sat down, felt sharp radiating pain to left arm, but again developed some right arm and leg weakness. He states that he occasionally will have this flareup whenever he has something else going on. [name] brain negative for acute stroke. Troponin and D-dimer negative. He was discharged in good condition.   REVIEW OF SYSTEMS: Full 14 system review of systems performed and notable only for those listed below and in HPI above, all others are negative:  Constitutional: N/A  Cardiovascular: chest pain  Ear/Nose/Throat: N/A Skin: N/A  Eyes: Loss of vision  Respiratory: N/A  Gastroitestinal: N/A  Genitourinary: N/A Hematology/Lymphatic: N/A  Endocrine: N/A  Musculoskeletal: N/A  Allergy/Immunology: N/A Neurological: dizziness and weakness  Psychiatric: Depression,  anxiety, daytime sleepiness, agitation, decreased concentration  The following represents the patient's updated allergies and side effects list: Allergies  Allergen Reactions  . Penicillins Other (See Comments)    Unknown childhood reaction  . Toradol [Ketorolac Tromethamine] Hives    Labs since last visit of relevance include the following: Results for orders placed or performed during the hospital encounter of 09/27/14  CBC  Result Value Ref Range   WBC 7.3 4.0 - 10.5 K/uL   RBC 4.71 4.22 - 5.81 MIL/uL   Hemoglobin 14.8 13.0 - 17.0 g/dL   HCT 57.8 46.9 - 62.9 %   MCV 89.6 78.0 - 100.0 fL   MCH 31.4 26.0 - 34.0 pg   MCHC 35.1 30.0 - 36.0 g/dL   RDW 52.8 41.3 - 24.4 %   Platelets 130 (L) 150 - 400 K/uL  Basic metabolic panel  Result Value Ref Range   Sodium 140 135 - 145 mmol/L   Potassium 3.6 3.5 - 5.1 mmol/L   Chloride 111 96 - 112 mmol/L   CO2 21 19 - 32 mmol/L   Glucose,  Bld 122 (H) 70 - 99 mg/dL   BUN 9 6 - 23 mg/dL   Creatinine, Ser 1.61 0.50 - 1.35 mg/dL   Calcium 9.2 8.4 - 09.6 mg/dL   GFR calc non Af Amer >90 >90 mL/min   GFR calc Af Amer >90 >90 mL/min   Anion gap 8 5 - 15  Troponin I ([facility])  Result Value Ref Range   Troponin I <0.03 <0.031 ng/mL  D-dimer, quantitative  Result Value Ref Range   D-Dimer, Quant 0.27 0.00 - 0.48 ug/mL-FEU  I-stat troponin, ED (not at [facility])  Result Value Ref Range   Troponin i, poc 0.00 0.00 - 0.08 ng/mL   Comment 3          I-stat troponin, ED  Result Value Ref Range   Troponin i, poc 0.00 0.00 - 0.08 ng/mL   Comment 3            The neurologically relevant items on the patient's problem list and lesion he and currently he equal and were reviewed on today's visit.  Neurologic Examination  A problem focused neurological exam (12 or more points of the single system neurologic examination, vital signs counts as 1 point, cranial nerves count for 8 points) was performed.  Blood pressure 99/67, pulse 89, height  (1.727  m), weight 198 lb 9.6 oz (90.084 kg).  Orthostatic vitals - lying 108/74 - 86, siting 103/73 - 98, standing 99/67 - 89  General - Well nourished, well developed, in no apparent distress.  Ophthalmologic - not able to see through  Cardiovascular - Regular rate and rhythm with no murmur.  Mental Status -  Level of arousal and orientation to time, place, and person were intact. Language including expression, naming, repetition, comprehension was assessed and found intact.  Cranial Nerves II - XII - II - left eye vision loss, not able to see light or hand waving. Right eye central vision can see Finger counting. III, IV, VI - Extraocular movements intact, pupillary reflex bilateral intact. V - Facial sensation intact bilaterally. VII - Facial movement intact bilaterally. VIII - Hearing & vestibular intact bilaterally. X - Palate elevates symmetrically. XI - Chin turning & shoulder shrug intact bilaterally. XII - Tongue protrusion intact.  Motor Strength - The patient's strength was normal in all extremities and pronator drift was absent.  Bulk was normal and fasciculations were absent.   Motor Tone - Muscle tone was assessed at the neck and appendages and was normal.  Reflexes - The patient's reflexes were normal in all extremities and he had no pathological reflexes.  Sensory - Light touch, temperature/pinprick were assessed and were normal.    Coordination - The patient had normal movements in the hands and feet with no ataxia or dysmetria.  Tremor was absent.  Gait and Station - slow due to vision deficit, but no hemiparetic gait.  Data reviewed: I personally reviewed the images and agree with the radiology interpretations.  Ct Head Wo Contrast: 01/25/2014 1. Punctate 3 mm focus of increased is seen in the right posterior fossa. This could be a tiny subarachnoid hemorrhage or right cerebellar hemorrhage. 2. Right frontal sinusitis. Multiple mucous retention cyst noted throughout  the paranasal sinuses. 3. No evidence of cervical spine fracture or dislocation.   Ct Cervical Spine Wo Contrast 01/25/2014: 1. Punctate 3 mm focus of increased is seen in the right posterior fossa. This could be a tiny subarachnoid hemorrhage or right cerebellar hemorrhage. 2. Right frontal sinusitis. Multiple mucous  retention cyst noted throughout the paranasal sinuses. 3. No evidence of cervical spine fracture or dislocation.   Echocardiogram 01/26/14: Upper normal wall thickness with LVEF 60-65%, grade 2 diastolic dysfunction. MAC with trivial mitral regurgitation. PASP 12 mmHg.   Carotid dopplers: BIlateral: mild soft plaque CCA and origin ICA. 1-39% ICA stenosis. Vertebral artery flow is antegrade.  MYOCARDIAL IMAGING WITH SPECT (REST AND PHARMACOLOGIC-STRESS): No definite inducible ischemia with pharmacologic stress. 67% ejection fraction  Ct Angio Head and neck W/cm &/or Wo Cm  02/11/2014  IMPRESSION: No carotid bifurcation stenosis. Smooth atherosclerotic thickening of the vascular walls. Carotid siphon atherosclerosis bilaterally, 30% stenosis on the right and 50% stenosis on the left. No evidence of large or medium vessel embolic occlusion.   Ct Head Wo Contrast  02/11/2014  No acute finding on today's study. Chronic small vessel ischemic changes of the cerebral hemispheric white matter. Punctate density in the right cerebellum seen on 07/24 is no longer visible and presumably represented a microhemorrhage which was acute at that time. ( I think it is not the same cut and the 8/10 one still has a little cut of the hyperdensity view.)  07/05/14 1. No acute intracranial pathology seen on CT. 2. Stable prominence of the occipital horns of the lateral ventricles is thought to reflect a normal variant. 3. Mild small vessel ischemic microangiopathy. 4. Mucus retention cyst or polyp at the left maxillary sinus. 5. Bilateral soft tissue lipomas noted overlying the parietal calvarium, with slight  bony remodeling noted on the left. Lipomas measure 1.8 cm on the right and 0.9 cm on the left.   [name] Brain Wo Contrast  09/27/14 - No acute intracranial abnormality. Stable noncontrast [name] appearance of the brain including nonspecific white matter signal changes.  09/22/14 - 1. No acute intracranial findings. 2. Moderate chronic small vessel disease.  04/08/14 - Chronic microvascular ischemic change in the white matter stable. Negative for acute infarct.  02/11/2014 IMPRESSION: Suggestion of a 2 mm acute infarction at the junction of the pons and midbrain dorsally on the left. This could be in the region of the trochlear nucleus, MLF or superior cerebellar peduncle. Normal intracranial MR angiography of the large and medium size vessels.   01/25/2014 1. No evidence of acute intracranial abnormality. The punctate hyperattenuating focus near the right foramen of Luschka on recent CT also appears to have been present on the 2013 [name], although less conspicuous on the older examination. Therefore, this most likely represents calcification, such as in choroid plexus, rather than acute hemorrhage. 2. Mild-to-moderate chronic small vessel ischemic disease.   [name] and MRA  05/20/14 1. No acute intracranial abnormality identified. 2. Moderate chronic small vessel ischemic disease. 3. Motion degraded MRA without evidence of major intracranial arterial occlusion or significant proximal stenosis. Left MCA branch vessels not well evaluated.   Component     Latest Ref Rng 01/27/2014 02/12/2014  Cholesterol     0 - 200 mg/dL 098152 119147  Triglycerides     <150 mg/dL 147213 (H) 829179 (H)  HDL     >39 mg/dL 34 (L) 33 (L)  Total CHOL/HDL Ratio      4.5 4.5  VLDL     0 - 40 mg/dL 43 (H) 36  LDL (calc)     0 - 99 mg/dL 75 78  Hemoglobin F6OA1C     <5.7 %  8.6 (H)  Mean Plasma Glucose     <117 mg/dL  130200 (H)  TSH  0.350 - 4.500 uIU/mL 1.620     Assessment: As you may recall, he is a 44 y.o. Caucasian male  with PMH of DM, DM neuropathy, HLD, blindness was admitted or ER visit on 7 times since 01/2014 for episodic LOC, unresponsive to verbal commands, confusion, nonverbal, right sided weakness/heavy feeling, headache, chest pain. Etiology not that clear. However, most times it happened when pt standing or from sitting or bending down to standing position, and orthostatic vital has trend for orthostatic hypotension, concerning for autonomic neuropathy due to diabetes. Ask pt and wife to get BP monitoring device and check at home. If continues to have trend of orthostatic hypotension, will consider droxidopa. Continue aggressive treatment of DM.     Plan:  - Continue ASA and lipitor for stroke prevention  - Follow up with your primary care physician for stroke risk factor modification. Recommend maintain blood pressure goal <130/80, diabetes with hemoglobin A1c goal below 6.5% and lipids with LDL cholesterol goal below 70 mg/dL.  - obtain BP device at home and monitor blood pressure and record. Once has symptoms, check BP right away.  - check glucose at home. - RTC in 3 months   No orders of the defined types were placed in this encounter.    Meds ordered this encounter  Medications  . topiramate (TOPAMAX) 50 MG tablet    Sig: Take 1 tablet (50 mg total) by mouth 2 (two) times daily.    Dispense:  60 tablet    Refill:  5    Patient Instructions  - continue ASA and lipitor for stroke prevention - decrease topamax back to  twice a day - you likely to have autonomic neuropathy due to diabetes and that caused you syncope and passing out. You need to be really slow on standing. If you feel symptoms coming, please lying down and check the BP right away.  Follow up with your primary care physician for stroke risk factor modification. Recommend maintain blood pressure goal <130/80, diabetes with hemoglobin A1c goal below 6.5% and lipids with LDL cholesterol goal below 70 mg/dL.  - check BP at home and  record. - follow up in 3 months.    Marvel Plan, MD PhD [facility] [address] [place], [place] [address] [phone]

## 2014-10-08 DIAGNOSIS — E1143 Type 2 diabetes mellitus with diabetic autonomic (poly)neuropathy: Secondary | ICD-10-CM | POA: Insufficient documentation

## 2014-10-10 ENCOUNTER — Encounter (HOSPITAL_COMMUNITY): Payer: Self-pay | Admitting: *Deleted

## 2014-10-10 ENCOUNTER — Emergency Department (HOSPITAL_COMMUNITY)
Admission: EM | Admit: 2014-10-10 | Discharge: 2014-10-11 | Disposition: A | Discharge status: Home / Self Care | Payer: Medicaid Other | Attending: Emergency Medicine | Admitting: Emergency Medicine

## 2014-10-10 DIAGNOSIS — J45909 Unspecified asthma, uncomplicated: Secondary | ICD-10-CM | POA: Insufficient documentation

## 2014-10-10 DIAGNOSIS — E119 Type 2 diabetes mellitus without complications: Secondary | ICD-10-CM | POA: Diagnosis not present

## 2014-10-10 DIAGNOSIS — F329 Major depressive disorder, single episode, unspecified: Secondary | ICD-10-CM | POA: Diagnosis not present

## 2014-10-10 DIAGNOSIS — H5462 Unqualified visual loss, left eye, normal vision right eye: Secondary | ICD-10-CM | POA: Insufficient documentation

## 2014-10-10 DIAGNOSIS — R531 Weakness: Secondary | ICD-10-CM

## 2014-10-10 DIAGNOSIS — R202 Paresthesia of skin: Secondary | ICD-10-CM | POA: Diagnosis not present

## 2014-10-10 DIAGNOSIS — F419 Anxiety disorder, unspecified: Secondary | ICD-10-CM | POA: Diagnosis not present

## 2014-10-10 DIAGNOSIS — H540 Blindness, both eyes: Secondary | ICD-10-CM | POA: Insufficient documentation

## 2014-10-10 DIAGNOSIS — G629 Polyneuropathy, unspecified: Secondary | ICD-10-CM | POA: Diagnosis not present

## 2014-10-10 DIAGNOSIS — Z79899 Other long term (current) drug therapy: Secondary | ICD-10-CM | POA: Diagnosis not present

## 2014-10-10 DIAGNOSIS — E780 Pure hypercholesterolemia: Secondary | ICD-10-CM | POA: Diagnosis not present

## 2014-10-10 DIAGNOSIS — G529 Cranial nerve disorder, unspecified: Secondary | ICD-10-CM | POA: Diagnosis not present

## 2014-10-10 DIAGNOSIS — M6281 Muscle weakness (generalized): Secondary | ICD-10-CM | POA: Insufficient documentation

## 2014-10-10 DIAGNOSIS — Z8673 Personal history of transient ischemic attack (TIA), and cerebral infarction without residual deficits: Secondary | ICD-10-CM | POA: Diagnosis not present

## 2014-10-10 DIAGNOSIS — Z880 Allergy status to penicillin: Secondary | ICD-10-CM | POA: Diagnosis not present

## 2014-10-10 DIAGNOSIS — Z7982 Long term (current) use of aspirin: Secondary | ICD-10-CM | POA: Insufficient documentation

## 2014-10-10 LAB — CBG MONITORING, ED: GLUCOSE-CAPILLARY: 213 mg/dL — AB (ref 70–99)

## 2014-10-10 NOTE — ED Provider Notes (Signed)
CSN: [id number]     Arrival date & time 10/10/14  2304 History  This chart was scribed for Marisa Severin, MD by Annye Asa, ED Scribe. This patient was seen in room D30C/D30C and the patient's care was started at 12:08 AM.    Chief Complaint  Patient presents with  . Right sided weakness    The history is provided by the patient. No language interpreter was used.     HPI Comments: Miguel Likes. is a right-hand dominant 44 y.o. male with past medical history of DM, neuropathy, blind in left eye and vision loss in right eye ("shapes and shadows"), HLD, stroke (02/11/2014 with residual right-sided weakness and numbness) who presents to the Emergency Department complaining of right-sided numbness and weakness beginning while resting comfortably around 22:00. Patient's wife explains that a family member attempted to ask him a question and patient could not respond appropriately, only making unintelligible, one-syllable sounds. This lasted until EMS arrived when patient became conversant and was able to describe his current symptoms. Patient explains he has prior experience with right-sided weakness but has never before had experience with numbness. He also reports mild chest discomfort, rated 1-2/10 at present. He denies headaches with today's symptoms. He denies recent cold or flu-like symptoms.   Wife states patient recently had a "questionable" stroke. He is in ongoing rehabilitation for cognitive communication issues. He is still taking all medications as prescribed, with the exception of glipizide (removed by his PCP).   Patient is a smoker.   Past Medical History  Diagnosis Date  . Diabetes mellitus without complication   . Neuropathy   . Asthma   . Vision loss of left eye   . Blindness     bilateral  . Hypercholesteremia   . Depression   . Anxiety   . Stroke 02/2014    right sided weakness/numbness   Past Surgical History  Procedure Laterality Date  . Hernia repair    . Eye  surgery Left   . Abdominal surgery     Family History  Problem Relation Age of Onset  . High blood pressure Mother   . High blood pressure Sister   . High blood pressure Paternal Grandfather    History  Substance Use Topics  . Smoking status: Current Every Day Smoker -- 1.00 packs/day    Types: Cigarettes  . Smokeless tobacco: Never Used  . Alcohol Use: No     Comment: Ocassional drinker    Review of Systems  HENT: Negative for congestion, rhinorrhea and sore throat.   Respiratory: Negative for cough.   Neurological: Positive for facial asymmetry, speech difficulty, weakness and numbness. Negative for headaches.  All other systems reviewed and are negative.   Allergies  Penicillins and Toradol  Home Medications   Prior to Admission medications   Medication Sig Start Date End Date Taking? Authorizing Provider  albuterol (PROVENTIL HFA;VENTOLIN HFA) 108 (90 BASE) MCG/ACT inhaler Inhale 2 puffs into the lungs every 6 (six) hours as needed for wheezing or shortness of breath.    Historical Provider, MD  aspirin 325 MG tablet Take 1 tablet (325 mg total) by mouth daily. Patient taking differently: Take 325 mg by mouth at bedtime.  05/21/14   Joseph Art, DO  atorvastatin (LIPITOR) 40 MG tablet Take 40 mg by mouth at bedtime.     Historical Provider, MD  FLUoxetine (PROZAC) 40 MG capsule Take 40 mg by mouth daily.    Historical Provider, MD  glipiZIDE (GLUCOTROL  XL) 10 MG 24 hr tablet Take 10 mg by mouth daily with breakfast.    Historical Provider, MD  metFORMIN (GLUCOPHAGE-XR) 500 MG 24 hr tablet Take 1,000 mg by mouth 2 (two) times daily.    Historical Provider, MD  Multiple Vitamins-Minerals (MULTIVITAMIN WITH MINERALS) tablet Take 1 tablet by mouth daily.    Historical Provider, MD  pregabalin (LYRICA) 150 MG capsule Take 150 mg by mouth 2 (two) times daily.    Historical Provider, MD  topiramate (TOPAMAX) 50 MG tablet Take 1 tablet (50 mg total) by mouth 2 (two) times  daily. 10/07/14   Marvel Plan, MD   BP 117/76 mmHg  Pulse 87  Temp(Src) 98.3 F (36.8 C) (Oral)  Resp 19  Ht  (1.727 m)  Wt 193 lb 1.6 oz (87.59 kg)  BMI 29.37 kg/m2  SpO2 97% Physical Exam  Constitutional: He is oriented to person, place, and time. He appears well-developed and well-nourished. No distress.  HENT:  Head: Normocephalic and atraumatic.  Mouth/Throat: Oropharynx is clear and moist. No oropharyngeal exudate.  Moist mucous membranes  Eyes: EOM are normal. Pupils are equal, round, and reactive to light.  Neck: Normal range of motion. Neck supple. No JVD present.  Cardiovascular: Normal rate, regular rhythm and normal heart sounds.  Exam reveals no gallop and no friction rub.   No murmur heard. Pulmonary/Chest: Effort normal and breath sounds normal. No respiratory distress. He has no wheezes. He has no rales.  Abdominal: Soft. Bowel sounds are normal. He exhibits no mass. There is no tenderness. There is no rebound and no guarding.  Musculoskeletal: Normal range of motion. He exhibits no edema.  Moves all extremities normally.   Lymphadenopathy:    He has no cervical adenopathy.  Neurological: He is alert and oriented to person, place, and time. He displays normal reflexes. A cranial nerve deficit is present. He exhibits normal muscle tone. Coordination abnormal.  Pt with mild right facial droop.  Pt is able to lift right arm, leg and hold against gravity for count of 5, but not to same elevation as left side.  Pt seen to hold right arm on own off bed for prolonged periods, sits up by holding onto rail with right arm.  Pt reports decreased sensation to right face, arm, leg  Skin: Skin is warm and dry. No rash noted.  Psychiatric: He has a normal mood and affect. His behavior is normal.  Nursing note and vitals reviewed.   ED Course  Procedures   DIAGNOSTIC STUDIES: Oxygen Saturation is 1% on RA, adequate by my interpretation.    COORDINATION OF CARE: 12:18 AM  Discussed treatment plan with pt at bedside and pt agreed to plan.   Labs Review Labs Reviewed  CBC WITH DIFFERENTIAL/PLATELET - Abnormal; Notable for the following:    Platelets 123 (*)    All other components within normal limits  COMPREHENSIVE METABOLIC PANEL - Abnormal; Notable for the following:    Glucose, Bld 218 (*)    All other components within normal limits  URINALYSIS, ROUTINE W REFLEX MICROSCOPIC - Abnormal; Notable for the following:    Glucose, UA 100 (*)    All other components within normal limits  CBG MONITORING, ED - Abnormal; Notable for the following:    Glucose-Capillary 213 (*)    All other components within normal limits  Rosezena Sensor, ED    Imaging Review Mr Brain Wo Contrast  10/11/2014   CLINICAL DATA:  RIGHT-sided numbness and weakness beginning  this evening, altered mental status, word-finding difficulty. History of LEFT eye blindness, decreased vision RIGHT eye, diabetes, hypercholesterolemia, stroke.  EXAM: [name] HEAD WITHOUT CONTRAST  TECHNIQUE: Multiplanar, multiecho pulse sequences of the brain and surrounding structures were obtained without intravenous contrast.  COMPARISON:  [name] of the brain September 27, 2014 and September 22, 2014  FINDINGS: Mild ventriculomegaly, likely on the basis of global parenchymal brain volume loss as there is overall commensurate enlargement of cerebral sulci and cerebellar folia, stable. No abnormal parenchymal signal, mass lesions, mass effect. No reduced diffusion to suggest acute ischemia. No susceptibility artifact to suggest hemorrhage. Patchy supratentorial white matter T2 hyperintensities are similar.  No abnormal extra-axial fluid collections. No extra-axial masses though, contrast enhanced sequences would be more sensitive. Normal major intracranial vascular flow voids seen at the skull base. Dolicoectatic intracranial vessel flow voids.  Ocular globes and orbital contents are unremarkable though not tailored for evaluation. No  abnormal sellar expansion. Pan paranasal sinus mild-to-moderate mucosal thickening without air-fluid levels. The mastoid air cells are well aerated. No suspicious calvarial bone marrow signal. No abnormal sellar expansion. Craniocervical junction maintained. Multiple scalp lipomas. Patient is edentulous.  IMPRESSION: No acute intracranial process, specifically no acute ischemia.  Mild parenchymal brain volume loss for age. Mild to moderate white matter changes suggest chronic small vessel ischemic disease, advanced for age.   Electronically Signed   By: Awilda Metroourtnay  Bloomer   On: 10/11/2014 02:31     EKG Interpretation None      MDM   Final diagnoses:  Right sided weakness  Paresthesia    I personally performed the services described in this documentation, which was scribed in my presence. The recorded information has been reviewed and is accurate.   44 year old male with acute onset of right-sided weakness and numbness.  He reports weakness from prior stroke.  Prior workup reviewed, no signs of prior stroke on CT or [name].  Patient sees neurologist regularly.  Workup today without acute findings.  Patient reassured the symptoms should resolve.  Will have him follow-up with primary care doctor.  Consult put in for physical therapy, but as I do not see this patient on a regular basis, may need to be put in by his primary care doctor.    Marisa Severinlga Beckie Viscardi, MD 10/11/14 [phone]

## 2014-10-10 NOTE — ED Notes (Signed)
Patient presents via EMS with c/o right sided weakness that started approximately 2215 while watching TV.  Moves all extremities.  C/o right sided weakness from a previous stroke (02/11/14)  History of left eye blindness.  Patient moves his right arm when he thinks no one is looking.  When smiling his right side of his mouth remains straight (patient does not have teeth)

## 2014-10-11 ENCOUNTER — Emergency Department (HOSPITAL_COMMUNITY): Payer: Medicaid Other

## 2014-10-11 LAB — I-STAT TROPONIN, ED: Troponin i, poc: 0 ng/mL (ref 0.00–0.08)

## 2014-10-11 LAB — URINALYSIS, ROUTINE W REFLEX MICROSCOPIC
Bilirubin Urine: NEGATIVE
GLUCOSE, UA: 100 mg/dL — AB
HGB URINE DIPSTICK: NEGATIVE
KETONES UR: NEGATIVE mg/dL
Leukocytes, UA: NEGATIVE
Nitrite: NEGATIVE
PROTEIN: NEGATIVE mg/dL
Specific Gravity, Urine: 1.018 (ref 1.005–1.030)
Urobilinogen, UA: 0.2 mg/dL (ref 0.0–1.0)
pH: 6 (ref 5.0–8.0)

## 2014-10-11 LAB — CBC WITH DIFFERENTIAL/PLATELET
BASOS PCT: 0 % (ref 0–1)
Basophils Absolute: 0 10*3/uL (ref 0.0–0.1)
EOS ABS: 0.3 10*3/uL (ref 0.0–0.7)
Eosinophils Relative: 4 % (ref 0–5)
HEMATOCRIT: 42.4 % (ref 39.0–52.0)
Hemoglobin: 15.1 g/dL (ref 13.0–17.0)
Lymphocytes Relative: 31 % (ref 12–46)
Lymphs Abs: 2.9 10*3/uL (ref 0.7–4.0)
MCH: 31.9 pg (ref 26.0–34.0)
MCHC: 35.6 g/dL (ref 30.0–36.0)
MCV: 89.5 fL (ref 78.0–100.0)
Monocytes Absolute: 0.5 10*3/uL (ref 0.1–1.0)
Monocytes Relative: 5 % (ref 3–12)
NEUTROS ABS: 5.5 10*3/uL (ref 1.7–7.7)
Neutrophils Relative %: 60 % (ref 43–77)
Platelets: 123 10*3/uL — ABNORMAL LOW (ref 150–400)
RBC: 4.74 MIL/uL (ref 4.22–5.81)
RDW: 12.7 % (ref 11.5–15.5)
WBC: 9.2 10*3/uL (ref 4.0–10.5)

## 2014-10-11 LAB — COMPREHENSIVE METABOLIC PANEL
ALT: 27 U/L (ref 0–53)
AST: 23 U/L (ref 0–37)
Albumin: 3.9 g/dL (ref 3.5–5.2)
Alkaline Phosphatase: 86 U/L (ref 39–117)
Anion gap: 10 (ref 5–15)
BUN: 14 mg/dL (ref 6–23)
CALCIUM: 9 mg/dL (ref 8.4–10.5)
CO2: 22 mmol/L (ref 19–32)
Chloride: 107 mmol/L (ref 96–112)
Creatinine, Ser: 0.82 mg/dL (ref 0.50–1.35)
GFR calc non Af Amer: >90 mL/min (ref >90)
GLUCOSE: 218 mg/dL — AB (ref 70–99)
Potassium: 3.7 mmol/L (ref 3.5–5.1)
SODIUM: 139 mmol/L (ref 135–145)
TOTAL PROTEIN: 6.4 g/dL (ref 6.0–8.3)
Total Bilirubin: 0.6 mg/dL (ref 0.3–1.2)

## 2014-10-11 NOTE — Discharge Instructions (Signed)
Your workup today has not shown a specific cause for your symptoms.  Follow up with your doctors for further workup.    Paresthesia Paresthesia is an abnormal burning or prickling sensation. This sensation is generally felt in the hands, arms, legs, or feet. However, it may occur in any part of the body. It is usually not painful. The feeling may be described as:  Tingling or numbness.  "Pins and needles."  Skin crawling.  Buzzing.  Limbs "falling asleep."  Itching. Most people experience temporary (transient) paresthesia at some time in their lives. CAUSES  Paresthesia may occur when you breathe too quickly (hyperventilation). It can also occur without any apparent cause. Commonly, paresthesia occurs when pressure is placed on a nerve. The feeling quickly goes away once the pressure is removed. For some people, however, paresthesia is a long-lasting (chronic) condition caused by an underlying disorder. The underlying disorder may be:  A traumatic, direct injury to nerves. Examples include a:  Broken (fractured) neck.  Fractured skull.  A disorder affecting the brain and spinal cord (central nervous system). Examples include:  Transverse myelitis.  Encephalitis.  Transient ischemic attack.  Multiple sclerosis.  Stroke.  Tumor or blood vessel problems, such as an arteriovenous malformation pressing against the brain or spinal cord.  A condition that damages the peripheral nerves (peripheral neuropathy). Peripheral nerves are not part of the brain and spinal cord. These conditions include:  Diabetes.  Peripheral vascular disease.  Nerve entrapment syndromes, such as carpal tunnel syndrome.  Shingles.  Hypothyroidism.  Vitamin B12 deficiencies.  Alcoholism.  Heavy metal poisoning (lead, arsenic).  Rheumatoid arthritis.  Systemic lupus erythematosus. DIAGNOSIS  Your caregiver will attempt to find the underlying cause of your paresthesia. Your caregiver  may:  Take your medical history.  Perform a physical exam.  Order various lab tests.  Order imaging tests. TREATMENT  Treatment for paresthesia depends on the underlying cause. HOME CARE INSTRUCTIONS  Avoid drinking alcohol.  You may consider massage or acupuncture to help relieve your symptoms.  Keep all follow-up appointments as directed by your caregiver. SEEK IMMEDIATE MEDICAL CARE IF:   You feel weak.  You have trouble walking or moving.  You have problems with speech or vision.  You feel confused.  You cannot control your bladder or bowel movements.  You feel numbness after an injury.  You faint.  Your burning or prickling feeling gets worse when walking.  You have pain, cramps, or dizziness.  You develop a rash. MAKE SURE YOU:  Understand these instructions.  Will watch your condition.  Will get help right away if you are not doing well or get worse. Document Released: 06/11/2002 Document Revised: 09/13/2011 Document Reviewed: 03/12/2011 [facility] Patient Information 2015 [employer], [place]. This information is not intended to replace advice given to you by your health care provider. Make sure you discuss any questions you have with your health care provider.

## 2014-10-11 NOTE — ED Notes (Signed)
Discharge instructions given  Voiced understanding.   

## 2014-10-18 ENCOUNTER — Ambulatory Visit: Payer: Medicaid Other

## 2014-10-18 ENCOUNTER — Ambulatory Visit: Payer: Medicaid Other | Attending: Internal Medicine

## 2014-10-18 DIAGNOSIS — R41841 Cognitive communication deficit: Secondary | ICD-10-CM | POA: Diagnosis present

## 2014-10-18 DIAGNOSIS — R531 Weakness: Secondary | ICD-10-CM

## 2014-10-18 DIAGNOSIS — R269 Unspecified abnormalities of gait and mobility: Secondary | ICD-10-CM

## 2014-10-19 NOTE — Therapy (Signed)
[facility] [address] [place], [place], [address] Phone: [phone]   Fax:  [phone]  Physical Therapy Evaluation  Patient Details  Name: Miguel Diaz. MRN: [id number] Date of Birth: 1970-10-13 Referring Provider:  Sherilyn Banker, MD  and Venita Sheffield, ANP Encounter Date: 10/18/2014      PT End of Session - 10/19/14 1319    Visit Number 1   Number of Visits 9   Date for PT Re-Evaluation 11/17/14   Authorization Type Medicaid-authorization pending   PT Start Time 1535   PT Stop Time 1621   PT Time Calculation (min) 46 min   Equipment Utilized During Treatment Gait belt   Activity Tolerance Patient tolerated treatment well   Behavior During Therapy Flat affect      Past Medical History  Diagnosis Date  . Diabetes mellitus without complication   . Neuropathy   . Asthma   . Vision loss of left eye   . Blindness     bilateral  . Hypercholesteremia   . Depression   . Anxiety   . Stroke 02/2014    right sided weakness/numbness    Past Surgical History  Procedure Laterality Date  . Hernia repair    . Eye surgery Left   . Abdominal surgery      There were no vitals filed for this visit.  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 Yes   Pain Score 2    Pain Location Leg   Pain Orientation Right;Left   Pain Descriptors / Indicators Burning   Pain Type Chronic pain   Pain Onset More than a month ago   Pain Frequency Intermittent   Aggravating Factors   standing for prolonged periods of time   Pain Relieving Factors nothing            [facility] PT Assessment - 10/18/14 1552    Assessment   Medical Diagnosis CVA with R hemiparesis   Onset Date 02/11/14   Prior Therapy HHPT after 02/2014 CVA   Precautions   Precautions Fall   Restrictions   Weight Bearing Restrictions No   Balance Screen   Has the patient fallen in the past 6 months Yes   How many times? 12   Has the patient had a decrease in activity level because of a fear of falling?  Yes   Is the patient reluctant to leave their home because of a fear of falling?  Yes   Home Environment   Living Enviornment Private residence   Living Arrangements Spouse/significant other  [name]-girlfriend   Available Help at Discharge Family   Type of Home House   Home Access Stairs to enter   Entrance Stairs-Number of Steps 3   Entrance Stairs-Rails Right   Home Layout One level   Home Equipment [place] - single point;Bedside commode;Tub bench   Prior Function   Level of Independence Independent with basic ADLs;Independent with gait;Independent with transfers   Vocation On disability   Leisure  mow the lawn, being outside   Cognition   Overall Cognitive Status Impaired/Different from baseline   Area of Impairment Memory   Memory Comments Pt worked with speech therapy last Fall to assist memory (short term).   Sensation   Light Touch Impaired by gross assessment  R UE/LE decreased light touch   Additional Comments R UE/LE N/T   Coordination   Gross Motor Movements are Fluid and Coordinated Yes   Fine Motor Movements are Fluid and Coordinated No  decreased speed during fingers to thumb   Posture/Postural Control   Posture/Postural Control Postural limitations   Postural Limitations Forward head   ROM / Strength   AROM / PROM / Strength AROM;Strength   AROM   Overall AROM  Within functional limits for tasks performed   Strength   Overall Strength Deficits   Overall Strength Comments  L LE strength WFL. R LE: hip flexion: 3+/5,  knee ext: 4-/5, knee flex: 3+/5, ankle dorsiflexion: 3+/5   Transfers   Transfers Sit to Stand;Stand to Sit   Sit to Stand 5: Supervision;Without upper extremity assist;From chair/3-in-1   Stand to Sit 5: Supervision;Without upper extremity assist;To chair/3-in-1   Ambulation/Gait   Ambulation/Gait Yes   Ambulation/Gait Assistance 5: Supervision;4: Min guard   Ambulation/Gait Assistance Details No overt LOB episodes. Pt ambulated in guarded manner and reported he did not trust his R leg.   Ambulation Distance (Feet) 100 Feet   Assistive device Straight cane   Gait Pattern Step-through pattern;Decreased stride length;Decreased step length - left;Decreased dorsiflexion - right;Poor foot clearance - left   Ambulation Surface Level;Indoor   Gait velocity 0.85ft/sec.  with SPC   Standardized Balance Assessment   Standardized Balance Assessment Berg Balance Test;Timed Up and Go Test   Berg Balance Test   Sit to Stand Able to stand without using hands and stabilize independently   Standing Unsupported Able to stand 2 minutes with supervision   Sitting with Back Unsupported but Feet Supported on Floor or Stool Able to sit safely and securely 2 minutes   Stand to Sit Sits safely with minimal use of hands   Transfers Able to transfer safely, definite need of hands   Standing Unsupported with Eyes Closed Able to stand 10 seconds with supervision   Standing Ubsupported with Feet Together Able to place feet together independently and stand for 1 minute with supervision   From Standing, Reach Forward with Outstretched Arm Can reach forward >12 cm safely (5")  6"   From Standing Position, Pick up Object from Floor Able to pick up shoe, needs supervision   From Standing Position, Turn to Look Behind Over each Shoulder Looks behind one side only/other side shows less weight shift   Turn 360 Degrees Able to turn 360 degrees safely but slowly   Standing  Unsupported, Alternately Place Feet on Step/Stool Able to stand independently and complete 8 steps >20 seconds   Standing Unsupported, One Foot in Front Able to plae foot ahead of the other independently and hold 30 seconds   Standing on One Leg Tries to lift leg/unable to hold 3 seconds but remains standing independently   Total Score 42   Timed Up and Go Test   TUG Normal TUG   Normal TUG (seconds) 20.69  with [facility]                           PT Education - 10/19/14 1318    Education provided  Yes   Education Details Results of TUG, BERG and gait speed testing. PT duration and frequency.   Person(s) Educated Patient;Other (comment)  girlfriend-[name]   Methods Explanation   Comprehension Verbalized understanding          PT Short Term Goals - 10/19/14 1323    PT SHORT TERM GOAL #1   Title Same as LTGs.           PT Long Term Goals - 10/19/14 1323    PT LONG TERM GOAL #1   Title Pt will be independent in HEP to improve strength, balance, endurance, and safety during funcitonal mobility. Target date: 11/14/13.   Baseline Pt does not have a current HEP (home exercise program).   Status New   PT LONG TERM GOAL #2   Title Pt will improve BERG score by >/=46/56 to decrease risk of falls. Target date: 11/15/14.   Baseline BERG: 42/56   Status New   PT LONG TERM GOAL #3   Title Pt will ambulate 500' with LRAD, over even/unven terrain at MOD I level to improve functional mobililty. Target date 11/15/14.   Baseline Pt currently ambulates 200' over even terrain with LRAD due to LOB and R LE weakness.   Status New   PT LONG TERM GOAL #4   Title Pt will improve gait speed with LRAD to >/=1.218ft/sec to decrease falls risk. Target date: 11/15/14.   Baseline gait speed with SPC: 0.1975ft/sec.   Status New   PT LONG TERM GOAL #5   Title Pt will perform TUG with LRAD in </=13.5 seconds to decrease falls risk. Target date: 11/15/14.   Baseline TUG with SPC: 20.69sec.   Status  New   Additional Long Term Goals   Additional Long Term Goals Yes   PT LONG TERM GOAL #6   Title Pt will report no falls in the last 4 weeks to improve safety during functional moblility. Target date: 11/15/14.   Baseline 12 falls in the last 6 months.   Status New               Plan - 10/18/14 1544    Clinical Impression Statement Pt is a pleasant 43y/o male presenting to OPPT neuro with history of CVA in 02/2014. Pt's girlfriend present during session [name]([name]). Pt has experienced multiple bouts (once a month) of increased R sided N/T and weakness, R facial droop, difficulty speaking, and feeling "dazed".  Pt's MD reported imaging did not reveal an acute infarct. Pt's symptoms typically resolve within 24 hours, but he still experiences residual R-sided weakness and N/T from the 02/2014 CVA.  Pt has fallen approx. 12 times in the last six months.  Pt also experiences episodes of dizziness/lightheadedness, which MD told pt was due to orthostatic hypotension.  Pt presented with decreased strength, endurance, and impaired balance. Pt's BERG score, TUG time, and gait speed all indicate pt is at a high risk for falls.    Pt will benefit from skilled therapeutic intervention in order to improve on the following deficits Abnormal gait;Decreased mobility;Decreased strength;Decreased balance;Decreased knowledge of use of DME;Decreased endurance;Impaired flexibility  pt's pain will not be directly addressed by PT, but PT will continue to monitor.   Rehab Potential Good   PT Frequency 2x / week   PT Duration 4 weeks   PT Treatment/Interventions ADLs/Self Care Home Management;Gait training;Neuromuscular re-education;Stair training;Biofeedback;Functional mobility training;Patient/family education;Cryotherapy;Therapeutic activities;Therapeutic exercise;Electrical Stimulation;DME Instruction;Balance training;Manual techniques   PT Next Visit Plan Initiate balance/stengthening HEP   Consulted and  Agree with  Plan of Care Patient;Family member/caregiver   Family Member Consulted girlfriend-[name]         Problem List Patient Active Problem List   Diagnosis Date Noted  . 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 Encephalopathy 08/01/2014  . Migraine 07/31/2014  . 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 Orthostatic hypotension 06/03/2014  . 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 Cerebral thrombosis with cerebral infarction 05/20/2014  . Right sided weakness 05/19/2014  . Stroke   . DM type 2 with diabetic peripheral neuropathy 04/09/2014  . Cerebral infarction due to thrombosis of cerebral artery 03/27/2014  . Essential hypertension, benign 03/27/2014  . HLD (hyperlipidemia) 03/27/2014  . Type II or unspecified type diabetes mellitus with peripheral circulatory disorders, uncontrolled(250.72) 02/12/2014  . Tobacco abuse 02/12/2014  . CVA (cerebral infarction) 02/11/2014  . Syncope 01/25/2014  . Hip pain 01/25/2014  . SAH (subarachnoid hemorrhage) 01/25/2014  . Vision, loss, sudden 01/25/2014  . Type II or unspecified type diabetes mellitus without mention of complication, uncontrolled 01/25/2014    Bea Duren L 10/19/2014, 1:34 PM  [facility] [address] [place], [place], [address] Phone: [phone]   Fax:  [phone]    Zerita Boers, PT,DPT 10/19/2014 1:34 PM Phone: [phone] Fax: [phone]

## 2014-10-30 NOTE — Therapy (Signed)
[facility] [address] [place], [place], [address] Phone: [phone]   Fax:  [phone]  Patient Details  Name: Miguel Diaz. MRN: [id number] Date of Birth: 23-Jan-1971 Referring Provider:  No ref. provider found  Encounter Date: 10/30/2014  PHYSICAL THERAPY DISCHARGE SUMMARY  Visits from Start of Care: 1  Current functional level related to goals / functional outcomes:     PT Long Term Goals - 10/19/14 1323    PT LONG TERM GOAL #1   Title Pt will be independent in HEP to improve strength, balance, endurance, and safety during funcitonal mobility. Target date: 11/14/13.   Baseline Pt does not have a current HEP (home exercise program).   Status New   PT LONG TERM GOAL #2   Title Pt will improve BERG score by >/=46/56 to decrease risk of falls. Target date: 11/15/14.   Baseline BERG: 42/56   Status New   PT LONG TERM GOAL #3   Title Pt will ambulate 500' with LRAD, over even/unven terrain at MOD I level to improve functional mobililty. Target date 11/15/14.   Baseline Pt currently ambulates 200' over even terrain with LRAD due to LOB and R LE weakness.   Status New   PT LONG TERM GOAL #4   Title Pt will improve gait speed with LRAD to >/=1.76f/sec to decrease falls risk. Target date: 11/15/14.   Baseline gait speed with SPC: 0.774fsec.   Status New   PT LONG TERM GOAL #5   Title Pt will perform TUG with LRAD in </=13.5 seconds to decrease falls risk. Target date: 11/15/14.   Baseline TUG with SPC: 20.69sec.   Status New   Additional Long Term Goals   Additional Long Term Goals Yes   PT LONG TERM GOAL #6   Title Pt will report no falls in the last 4 weeks to improve safety during functional moblility. Target date: 11/15/14.   Baseline 12 falls in the last 6 months.   Status New        Remaining deficits: Unknown, as pt did not return. Pt cancelled remaining visits, as [facility] and pt can't afford to pay out of pocket.   Education / Equipment: PT frequency/duration  Plan: Patient agrees to discharge.  Patient goals were not met. Patient is being discharged due to financial reasons.  ?????       Awab Abebe L 10/30/2014, 1:27 PM  [place]one: [phone] Fax:  [phone]  JeGeoffry ParadisePT,DPT 10/30/2014 1:27 PM Phone: [phone]: [phone]

## 2014-11-05 ENCOUNTER — Ambulatory Visit: Payer: Self-pay

## 2014-11-08 ENCOUNTER — Ambulatory Visit: Payer: Self-pay

## 2014-11-12 ENCOUNTER — Ambulatory Visit: Payer: Self-pay

## 2014-11-15 ENCOUNTER — Ambulatory Visit: Payer: Self-pay

## 2014-11-19 ENCOUNTER — Ambulatory Visit: Payer: Self-pay

## 2014-11-22 ENCOUNTER — Ambulatory Visit: Payer: Self-pay

## 2014-11-25 ENCOUNTER — Ambulatory Visit (INDEPENDENT_AMBULATORY_CARE_PROVIDER_SITE_OTHER): Payer: Medicaid Other | Admitting: Neurology

## 2014-11-25 ENCOUNTER — Encounter: Payer: Self-pay | Admitting: Neurology

## 2014-11-25 VITALS — BP 113/76 | HR 75 | Temp 98.0°F | Ht 68.0 in | Wt 200.4 lb

## 2014-11-25 DIAGNOSIS — R404 Transient alteration of awareness: Secondary | ICD-10-CM | POA: Diagnosis not present

## 2014-11-25 MED ORDER — DIVALPROEX SODIUM ER 500 MG PO TB24: 500.0000 mg | ORAL_TABLET | Freq: Every day | ORAL | Status: DC

## 2014-11-25 NOTE — Patient Instructions (Signed)
Overall you are doing fairly well but I do want to suggest a few things today:   Remember to drink plenty of fluid, eat healthy meals and do not skip any meals. Try to eat protein with a every meal and eat a healthy snack such as fruit or nuts in between meals. Try to keep a regular sleep-wake schedule and try to exercise daily, particularly in the form of walking, 20-30 minutes a day, if you can.   As far as your medications are concerned, I would like to suggest Stop Topamax as directed Start Depakote 500mg  in the evenings  As far as diagnostic testing: EEG  Our phone number is [phone]. We also have an after hours call service for urgent matters and there is a physician on-call for urgent questions. For any emergencies you know to call 911 or go to the nearest emergency room

## 2014-11-25 NOTE — Progress Notes (Addendum)
[facility]    Provider:  Dr Lucia GaskinsAhern Referring Provider: Sherilyn Bankerheren, Isa, MD Primary Care Physician:  Sherilyn BankerHEREN, ISA, MD  CC:  Repeated Right-sided weakness with altered awareness    HPI:  Miguel CoeGeorge L Fettig Jr. is a 44 y.o. male here as a referral from Dr. Cleda Clarksheren for acute onset right-sided weakness. He follows with Dr. Roda ShuttersXu in the office however he is seeing me today as Dr. Roda ShuttersXu is not here and patient had another episode of right-sided weakness with altered awareness. PMH of DM, DM neuropathy, HLD, blindness was admitted or or had ER visits  7 times since 01/2014 for episodic LOC, unresponsive to verbal commands, confusion, nonverbal, right sided weakness/heavy feeling, headache, chest pain. Etiology has been unclear. 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 The possibility of complicated migraines has also been question and patient was placed on Topamax. However most recently patient had another episode and this time she was laying on the couch.    In April, patient was laying on the couch and he was not responding. He was just staring off. His girl friend is with him and provides most information. Eyes wide open. Looking straight in the direction of the TV, no foaming at the mouth, no abnormal movements. The paramedics came, staring spell lasted until the paramedics came, he would occ grunt. He closed his eyes in 5 minutes but no response except a grunt when someone would say his name. He told the paramedics that his right side was numb, no pain on the right side to noxious stimuli by the paramedics. They took him to the ED and by the time he got to the ED, there was slurred speech and facial droop. There was no headache associated with this episode. He is on topamax 50mg  bid for complicated migraines. He was confused afterwards but he ok when got to the  hospital. Also c/o memory loss.  In [place] there was reportedly an episode of GTCS after sitting on the couch with confusion afterwards.  Reviewed notes, labs and imaging from outside physicians, which showed:  EEG normal [name] of the brain: Personally reviewed images: Mild ventriculomegaly likely due to global atrophy No mass lesions or mass effect, no acute ischemia or acute events. Nonspecific chronic white matter changes likely small vessel ischemic disease which is advanced for age   Review of Systems: Patient complains of symptoms per HPI as well as the following symptoms: Dizziness, headache, weakness, joint pain, walking difficulty, daytime sleepiness, snoring. Pertinent negatives per HPI. All others negative.   History   Social History  . Marital Status: Significant Other    Spouse Name: N/A  . Number of Children: 2  . Years of Education: 9th   Occupational History  . disabled    Social History Main Topics  . Smoking status: Current Every Day Smoker -- 1.00 packs/day    Types: Cigarettes  . Smokeless tobacco: Never Used  . Alcohol Use: No     Comment: Ocassional drinker  . Drug Use: No  . Sexual Activity: Yes   Other Topics Concern  . Not on file   Social History Narrative   ** Merged History Encounter **   Patient  Lives with his girl friend   Patient is right handed   Patient coffee daily        Family History  Problem Relation Age of Onset  . High  blood pressure Mother   . High blood pressure Sister   . High blood pressure Paternal Grandfather     Past Medical History  Diagnosis Date  . Diabetes mellitus without complication   . Neuropathy   . Asthma   . Vision loss of left eye   . Blindness     bilateral  . Hypercholesteremia   . Depression   . Anxiety   . Stroke 02/2014    right sided weakness/numbness    Past Surgical History  Procedure Laterality Date  . Hernia repair    . Eye surgery Left   . Abdominal surgery      Current  Outpatient Prescriptions  Medication Sig Dispense Refill  . albuterol (PROVENTIL HFA;VENTOLIN HFA) 108 (90 BASE) MCG/ACT inhaler Inhale 2 puffs into the lungs every 6 (six) hours as needed for wheezing or shortness of breath.    Marland Kitchen aspirin 325 MG tablet Take 1 tablet (325 mg total) by mouth daily. 30 tablet 0  . atorvastatin (LIPITOR) 40 MG tablet Take 40 mg by mouth at bedtime.     Marland Kitchen FLUoxetine (PROZAC) 40 MG capsule Take 40 mg by mouth daily.    Marland Kitchen ibuprofen (ADVIL,MOTRIN) 600 MG tablet Take 600 mg by mouth every 6 (six) hours as needed for moderate pain.    . metFORMIN (GLUCOPHAGE-XR) 500 MG 24 hr tablet Take 1,000 mg by mouth 2 (two) times daily.    . Multiple Vitamins-Minerals (MULTIVITAMIN WITH MINERALS) tablet Take 1 tablet by mouth daily.    . pregabalin (LYRICA) 150 MG capsule Take 150 mg by mouth 2 (two) times daily.    Marland Kitchen topiramate (TOPAMAX) 50 MG tablet Take 1 tablet (50 mg total) by mouth 2 (two) times daily. 60 tablet 5   No current facility-administered medications for this visit.    Allergies as of 11/25/2014 - Review Complete 11/25/2014  Allergen Reaction Noted  . Penicillins Other (See Comments) 01/25/2014  . Toradol [ketorolac tromethamine] Hives 01/25/2014    Vitals: BP 113/76 mmHg  Pulse 75  Temp(Src) 98 F (36.7 C)  Ht  (1.727 m)  Wt 200 lb 6.4 oz (90.901 kg)  BMI 30.48 kg/m2 Last Weight:  Wt Readings from Last 1 Encounters:  11/25/14 200 lb 6.4 oz (90.901 kg)   Last Height:   Ht Readings from Last 1 Encounters:  11/25/14  (1.727 m)   Physical exam: Exam: Gen: NAD, conversant                      Neuro: Focused Neurologic Exam  Speech:    Speech is normal; fluent and spontaneous with normal comprehension.  Cognition:    The patient is oriented to person, place, and time;     recent and remote memory intact;     language fluent;     normal attention, concentration,     fund of knowledge Cranial Nerves:    The pupils are equal, round,  and reactive to light. Patient has no vision in the left eye cannot see light or shadows, right eye he can see finger counting with severely limited peripheral vision. Extraocular movements are intact. Trigeminal sensation is intact and the muscles of mastication are normal. The face is symmetric. The palate elevates in the midline. Hearing intact. Voice is normal. Shoulder shrug is normal. The tongue has normal motion without fasciculations.    Gait:    Not ataxic  Motor Observation:    No asymmetry, no atrophy, and no  involuntary movements noted. Tone:    Normal muscle tone.    Posture:    Posture is normal. normal erect    Strength:    Strength is normal in the upper and lower limbs.        Assessment/Plan:   PMH of DM, DM neuropathy, HLD, blindness was admitted or or had ER visits  7 times since 01/2014 for episodic LOC, unresponsive to verbal commands, confusion, nonverbal, right sided weakness/heavy feeling, headache, chest pain. Etiology has been unclear. 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 The possibility of complicated migraines has also been question and patient was placed on Topamax. However most recently patient had another episode and this time she was laying on the couch.  Will discontinue Topamax as patient is complaining about memory loss. Will start Depakote for possible complicated migraines, vs seizure. May also help with his mood.   Will order a routine EEG. If this is unrevealing can consider an extended sleep deprived EEG.  Continue ASA and Lipitor for stroke prevention  Needs to follow closely with primary care for management of vascular risk factors.  Follow-up with Dr. Dione Housekeeper, MD  [facility] [address] [place], [place] [address]  Phone [phone] Fax [phone]  A  total of 30 minutes was spent face-to-face with this patient. Over half this time was spent on counseling patient on the seizure versus migraine diagnosis and different diagnostic and therapeutic options available.

## 2014-11-26 ENCOUNTER — Ambulatory Visit: Payer: Self-pay

## 2014-11-27 ENCOUNTER — Telehealth: Payer: Self-pay | Admitting: Neurology

## 2014-11-27 ENCOUNTER — Ambulatory Visit: Payer: Medicaid Other

## 2014-11-27 NOTE — Telephone Encounter (Signed)
Miguel ArtLinda Diaz the patient's girlfriend is calling regarding the patient. The patient was seen by Dr. Lucia GaskinsAhern on 11-25-14. Bonita QuinLinda has questions about divalproex (DEPAKOTE ER) 500 MG 24 hr tablet that Dr. Lucia GaskinsAhern prescribed for the patient because the patient does take Aspirin.  Please call and discuss. Thank you.

## 2014-11-28 NOTE — Telephone Encounter (Signed)
I spoke to patient. They should be fine together, take them separately

## 2014-12-04 ENCOUNTER — Ambulatory Visit (INDEPENDENT_AMBULATORY_CARE_PROVIDER_SITE_OTHER): Payer: Medicaid Other | Admitting: Neurology

## 2014-12-04 DIAGNOSIS — R404 Transient alteration of awareness: Secondary | ICD-10-CM | POA: Diagnosis not present

## 2014-12-04 NOTE — Procedures (Signed)
    History:  Miguel Diaz is a 44 year old gentleman with a history of migraine headaches, and a history of a generalized tonic-clonic seizure in March 2016. The patient had an episode in April associated with confusion, unresponsiveness, staring off. The patient is being evaluated for these events.  This is a routine EEG. No skull defects are noted. Medications include albuterol, aspirin, Lipitor, Prozac, metformin, multivitamins, Lyrica, and Depakote.   EEG classification: Normal awake and drowsy  Description of the recording: The background rhythms of this recording consists of a fairly well modulated medium amplitude alpha rhythm of 11 Hz that is reactive to eye opening and closure. As the record progresses, the patient appears to remain in the waking state throughout the recording. Photic stimulation was performed, resulting in a bilateral and symmetric photic driving response. Hyperventilation was also performed, resulting in a minimal buildup of the background rhythm activities without significant slowing seen. Toward the end of the recording, the patient enters the drowsy state with slight symmetric slowing seen. The patient never enters stage II sleep. At no time during the recording does there appear to be evidence of spike or spike wave discharges or evidence of focal slowing. EKG monitor shows no evidence of cardiac rhythm abnormalities with a heart rate of 72.  Impression: This is a normal EEG recording in the waking and drowsy state. No evidence of ictal or interictal discharges are seen.

## 2014-12-05 ENCOUNTER — Telehealth: Payer: Self-pay

## 2014-12-05 NOTE — Telephone Encounter (Signed)
Patient was informed of normal EEG results, he has no questions at this time

## 2014-12-17 ENCOUNTER — Encounter (HOSPITAL_COMMUNITY): Payer: Self-pay | Admitting: Emergency Medicine

## 2014-12-17 ENCOUNTER — Emergency Department (HOSPITAL_COMMUNITY): Payer: Medicaid Other

## 2014-12-17 ENCOUNTER — Observation Stay (HOSPITAL_COMMUNITY)
Admission: EM | Admit: 2014-12-17 | Discharge: 2014-12-19 | Disposition: A | Discharge status: Home / Self Care | Payer: Medicaid Other | Attending: Internal Medicine | Admitting: Internal Medicine

## 2014-12-17 DIAGNOSIS — Z7982 Long term (current) use of aspirin: Secondary | ICD-10-CM | POA: Diagnosis not present

## 2014-12-17 DIAGNOSIS — G9349 Other encephalopathy: Principal | ICD-10-CM | POA: Insufficient documentation

## 2014-12-17 DIAGNOSIS — F329 Major depressive disorder, single episode, unspecified: Secondary | ICD-10-CM | POA: Diagnosis not present

## 2014-12-17 DIAGNOSIS — H540 Blindness, both eyes: Secondary | ICD-10-CM | POA: Insufficient documentation

## 2014-12-17 DIAGNOSIS — J45909 Unspecified asthma, uncomplicated: Secondary | ICD-10-CM | POA: Diagnosis not present

## 2014-12-17 DIAGNOSIS — G3189 Other specified degenerative diseases of nervous system: Secondary | ICD-10-CM | POA: Insufficient documentation

## 2014-12-17 DIAGNOSIS — Z8673 Personal history of transient ischemic attack (TIA), and cerebral infarction without residual deficits: Secondary | ICD-10-CM | POA: Insufficient documentation

## 2014-12-17 DIAGNOSIS — R51 Headache: Secondary | ICD-10-CM | POA: Diagnosis not present

## 2014-12-17 DIAGNOSIS — E785 Hyperlipidemia, unspecified: Secondary | ICD-10-CM | POA: Insufficient documentation

## 2014-12-17 DIAGNOSIS — R4182 Altered mental status, unspecified: Secondary | ICD-10-CM | POA: Diagnosis present

## 2014-12-17 DIAGNOSIS — E1142 Type 2 diabetes mellitus with diabetic polyneuropathy: Secondary | ICD-10-CM

## 2014-12-17 DIAGNOSIS — R4020 Unspecified coma: Secondary | ICD-10-CM | POA: Insufficient documentation

## 2014-12-17 DIAGNOSIS — G9340 Encephalopathy, unspecified: Secondary | ICD-10-CM | POA: Diagnosis present

## 2014-12-17 NOTE — ED Provider Notes (Signed)
CSN: [id number]     Arrival date & time 12/17/14  2147 History   First MD Initiated Contact with Patient 12/17/14 2149     Chief Complaint  Patient presents with  . Loss of Consciousness   Miguel Diaz. is a 44 y.o. male with a history of diabetes, blindness, CVA, and seizures who presents to the emergency department after he was found on the ground after passing out while walking outside to rule out his car windows. The patient's girlfriend reports that he walked outside on 8:30 PM to roll down his car windows and a few minutes later she noticed that the patient was lying on his back on the ground outside. She reports a the patient was unresponsive for approximately 4 minutes and then came around but seemed confused. She denies any seizure like activity.  The patient is complaining of pain to the back of his head, neck pain, and bilateral rib pain. He also complains of right knee pain. The patient denies memory of the fall or prodromal symptoms. The patient was recently started on Depakote for seizures by his neurologist. The girlfriend reports he has been taking the Depakote. 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 The patient can only see shapes. The patient denies fevers, chills, chest pain, shortness of breath, palpitations, abdominal pain, nausea, vomiting, numbness, tingling, weakness or rashes.     (Consider location/radiation/quality/duration/timing/severity/associated sxs/prior Treatment) HPI  Past Medical History  Diagnosis Date  . Diabetes mellitus without complication   . Neuropathy   . Asthma   . Vision loss of left eye   . Blindness     bilateral  . Hypercholesteremia   . Depression   . Anxiety   . Stroke 02/2014    right sided weakness/numbness   Past Surgical History  Procedure Laterality Date  . Hernia repair    . Eye surgery Left   . Abdominal surgery     Family History  Problem Relation Age of Onset  . High  blood pressure Mother   . High blood pressure Sister   . High blood pressure Paternal Grandfather    History  Substance Use Topics  . Smoking status: Current Every Day Smoker -- 1.00 packs/day    Types: Cigarettes  . Smokeless tobacco: Never Used  . Alcohol Use: No     Comment: Ocassional drinker    Review of Systems  Constitutional: Negative for fever and chills.  HENT: Negative for congestion, ear pain and sore throat.   Respiratory: Negative for cough, shortness of breath and wheezing.   Cardiovascular: Negative for chest pain and palpitations.  Gastrointestinal: Negative for nausea, vomiting and abdominal pain.  Genitourinary: Negative for dysuria and difficulty urinating.  Musculoskeletal: Positive for arthralgias (right knee pain) and neck pain. Negative for back pain.  Skin: Negative for rash.  Neurological: Positive for syncope and headaches. Negative for dizziness, light-headedness and numbness.      Allergies  Penicillins and Toradol  Home Medications   Prior to Admission medications   Medication Sig Start Date End Date Taking? Authorizing Provider  albuterol (PROVENTIL HFA;VENTOLIN HFA) 108 (90 BASE) MCG/ACT inhaler Inhale 2 puffs into the lungs every 6 (six) hours as needed for wheezing or shortness of breath.   Yes Historical Provider, MD  aspirin 325 MG tablet Take 1 tablet (325 mg total) by mouth daily. 05/21/14  Yes Joseph Art, DO  atorvastatin (LIPITOR) 40 MG tablet Take 40  mg by mouth at bedtime.    Yes Historical Provider, MD  divalproex (DEPAKOTE ER) 500 MG 24 hr tablet Take 1 tablet (500 mg total) by mouth at bedtime. 11/25/14  Yes Anson Fret, MD  FLUoxetine (PROZAC) 40 MG capsule Take 40 mg by mouth daily.   Yes Historical Provider, MD  ibuprofen (ADVIL,MOTRIN) 600 MG tablet Take 600 mg by mouth every 6 (six) hours as needed for moderate pain.   Yes Historical Provider, MD  metFORMIN (GLUCOPHAGE-XR) 500 MG 24 hr tablet Take 1,000 mg by mouth 2  (two) times daily.   Yes Historical Provider, MD  Multiple Vitamins-Minerals (MULTIVITAMIN WITH MINERALS) tablet Take 1 tablet by mouth daily.   Yes Historical Provider, MD  pregabalin (LYRICA) 150 MG capsule Take 150 mg by mouth 2 (two) times daily.   Yes Historical Provider, MD   BP 124/87 mmHg  Pulse 80  Resp 16  SpO2 95% Physical Exam  Constitutional: He appears well-developed and well-nourished. No distress.  Nontoxic appearing.  HENT:  Head: Normocephalic and atraumatic.  Right Ear: External ear normal.  Left Ear: External ear normal.  Mouth/Throat: Oropharynx is clear and moist. No oropharyngeal exudate.  Bilateral tympanic membranes are pearly-gray without erythema or loss of landmarks.   Eyes: Conjunctivae are normal. Pupils are equal, round, and reactive to light. Right eye exhibits no discharge. Left eye exhibits no discharge.  Neck: Neck supple.  Patient wearing c-collar.  Cardiovascular: Normal rate, regular rhythm, normal heart sounds and intact distal pulses.  Exam reveals no gallop and no friction rub.   No murmur heard. Bilateral radial pulses are intact.  Pulmonary/Chest: Effort normal and breath sounds normal. No respiratory distress. He has no wheezes. He has no rales. He exhibits tenderness.  Lungs are clear to auscultation bilaterally. Mild bilateral lower rib tenderness to palpation. No chest crepitus, flail chest or deformity noted.  Abdominal: Soft. Bowel sounds are normal. He exhibits no distension. There is no tenderness. There is no rebound and no guarding.  Abdomen is soft and nontender to palpation.  Musculoskeletal: He exhibits tenderness. He exhibits no edema.  Tenderness to the anterior aspect of the patients right knee. He has good strength in his bilateral upper and lower extremities.   Neurological: He is alert. Coordination normal.  Patient is oriented to person and is mildly confused about place and time. He believes he is at [facility]  and believes the year is 36. Good and equal grip strength bilaterally. Sensation is intact in his bilateral upper and lower extremities. No pronator drift. Vision is unable to be assessed due to blindness. Otherwise cranial nerves are intact. Finger to nose intact bilaterally.   Skin: Skin is warm and dry. No rash noted. He is not diaphoretic. No erythema. No pallor.  Psychiatric: He has a normal mood and affect. His behavior is normal.  Nursing note and vitals reviewed.   ED Course  Procedures (including critical care time) Labs Review Labs Reviewed  BASIC METABOLIC PANEL - Abnormal; Notable for the following:    CO2 21 (*)    Glucose, Bld 238 (*)    Calcium 8.7 (*)    All other components within normal limits  CBC WITH DIFFERENTIAL/PLATELET - Abnormal; Notable for the following:    Platelets 119 (*)    All other components within normal limits  VALPROIC ACID LEVEL - Abnormal; Notable for the following:    Valproic Acid Lvl 30 (*)    All other components within normal  limits  URINALYSIS, ROUTINE W REFLEX MICROSCOPIC (NOT AT [facility]) - Abnormal; Notable for the following:    APPearance CLOUDY (*)    Specific Gravity, Urine 1.034 (*)    Glucose, UA >1000 (*)    Ketones, ur 15 (*)    All other components within normal limits  HEPATIC FUNCTION PANEL - Abnormal; Notable for the following:    Total Protein 6.0 (*)    Bilirubin, Direct <0.1 (*)    All other components within normal limits  AMMONIA - Abnormal; Notable for the following:    Ammonia 46 (*)    All other components within normal limits  ACETAMINOPHEN LEVEL - Abnormal; Notable for the following:    Acetaminophen (Tylenol), Serum <10 (*)    All other components within normal limits  CBG MONITORING, ED - Abnormal; Notable for the following:    Glucose-Capillary 225 (*)    All other components within normal limits  ETHANOL  URINE RAPID DRUG SCREEN, HOSP PERFORMED  SALICYLATE LEVEL  URINE MICROSCOPIC-ADD ON  Rosezena Sensor, ED    Imaging Review Dg Chest 2 View  12/17/2014   CLINICAL DATA:  Loss of consciousness in the back yd. Low chest pain when taking of breath.  EXAM: CHEST  2 VIEW  COMPARISON:  09/27/2014  FINDINGS: Shallow inspiration. The heart size and mediastinal contours are within normal limits. Both lungs are clear. The visualized skeletal structures are unremarkable.  IMPRESSION: No active cardiopulmonary disease.   Electronically Signed   By: Burman Nieves M.D.   On: 12/17/2014 22:51   Ct Head Wo Contrast  12/17/2014   CLINICAL DATA:  Initial evaluation for acute loss of consciousness  EXAM: CT HEAD WITHOUT CONTRAST  CT CERVICAL SPINE WITHOUT CONTRAST  TECHNIQUE: Multidetector CT imaging of the head and cervical spine was performed following the standard protocol without intravenous contrast. Multiplanar CT image reconstructions of the cervical spine were also generated.  COMPARISON:  Prior study from 10/11/2014  FINDINGS: CT HEAD FINDINGS  Mild cerebral atrophy for patient age. Mild chronic small vessel ischemic changes present within the periventricular and deep white matter of both cerebral hemispheres.  No acute large vessel territory infarct identified. No intracranial hemorrhage. No mass lesion, mass effect, or midline shift. No extra-axial fluid collection. Mild prominence of the occipital horns of the lateral ventricles is stable relative to multiple prior exams. No hydrocephalus.  Several small lipomas noted within the posterior parietal scalp with underlying bony remodeling, stable from prior. Scalp soft tissues otherwise within normal limits.  Calvarium intact.  No acute abnormality about the orbits.  Moderate opacity present within the left frontoethmoidal recess. Mild scattered mucosal thickening present within the ethmoidal air cells. Mild polypoid opacity present within the right sphenoid sinus. No mastoid effusion. Middle ear cavities are clear.  CT CERVICAL SPINE FINDINGS  The  vertebral bodies are normally aligned with preservation of the normal cervical lordosis. Vertebral body heights are preserved. Normal C1-2 articulations are intact. No prevertebral soft tissue swelling. No acute fracture or listhesis.  Mild multilevel degenerative disc disease present, most 7 at C7-T1. No significant canal or foraminal stenosis appreciated.  Visualized soft tissues of the neck are within normal limits. Visualized lung apices are clear without evidence of apical pneumothorax.  IMPRESSION: CT BRAIN:  1. No acute intracranial process. 2. Mild atrophy with chronic small vessel ischemic disease. 3. Mild paranasal sinus disease as above, most prominent within the left frontoethmoidal recess. 4. Scattered small soft tissue lipomas within the bilateral  parietal scalp with underlying bony remodeling, stable.  CT CERVICAL SPINE:  No acute traumatic injury within the cervical spine.   Electronically Signed   By: Rise Mu M.D.   On: 12/17/2014 23:33   Ct Cervical Spine Wo Contrast  12/17/2014   CLINICAL DATA:  Initial evaluation for acute loss of consciousness  EXAM: CT HEAD WITHOUT CONTRAST  CT CERVICAL SPINE WITHOUT CONTRAST  TECHNIQUE: Multidetector CT imaging of the head and cervical spine was performed following the standard protocol without intravenous contrast. Multiplanar CT image reconstructions of the cervical spine were also generated.  COMPARISON:  Prior study from 10/11/2014  FINDINGS: CT HEAD FINDINGS  Mild cerebral atrophy for patient age. Mild chronic small vessel ischemic changes present within the periventricular and deep white matter of both cerebral hemispheres.  No acute large vessel territory infarct identified. No intracranial hemorrhage. No mass lesion, mass effect, or midline shift. No extra-axial fluid collection. Mild prominence of the occipital horns of the lateral ventricles is stable relative to multiple prior exams. No hydrocephalus.  Several small lipomas noted  within the posterior parietal scalp with underlying bony remodeling, stable from prior. Scalp soft tissues otherwise within normal limits.  Calvarium intact.  No acute abnormality about the orbits.  Moderate opacity present within the left frontoethmoidal recess. Mild scattered mucosal thickening present within the ethmoidal air cells. Mild polypoid opacity present within the right sphenoid sinus. No mastoid effusion. Middle ear cavities are clear.  CT CERVICAL SPINE FINDINGS  The vertebral bodies are normally aligned with preservation of the normal cervical lordosis. Vertebral body heights are preserved. Normal C1-2 articulations are intact. No prevertebral soft tissue swelling. No acute fracture or listhesis.  Mild multilevel degenerative disc disease present, most 7 at C7-T1. No significant canal or foraminal stenosis appreciated.  Visualized soft tissues of the neck are within normal limits. Visualized lung apices are clear without evidence of apical pneumothorax.  IMPRESSION: CT BRAIN:  1. No acute intracranial process. 2. Mild atrophy with chronic small vessel ischemic disease. 3. Mild paranasal sinus disease as above, most prominent within the left frontoethmoidal recess. 4. Scattered small soft tissue lipomas within the bilateral parietal scalp with underlying bony remodeling, stable.  CT CERVICAL SPINE:  No acute traumatic injury within the cervical spine.   Electronically Signed   By: Rise Mu M.D.   On: 12/17/2014 23:33   Dg Knee Complete 4 Views Right  12/17/2014   CLINICAL DATA:  Loss of consciousness.  Knee pain.  EXAM: RIGHT KNEE - COMPLETE 4+ VIEW  COMPARISON:  02/23/2014  FINDINGS: There is no evidence of fracture, dislocation, or joint effusion. There is no evidence of arthropathy or other focal bone abnormality. Soft tissues are unremarkable.  IMPRESSION: Negative.   Electronically Signed   By: Burman Nieves M.D.   On: 12/17/2014 22:53     EKG Interpretation None       Filed Vitals:   12/17/14 2355 12/18/14 0135 12/18/14 0145 12/18/14 0248  BP: 121/83 125/84 122/87 124/87  Pulse: 84 81 87 80  Resp: 18 14 16 16   SpO2: 97% 96% 90% 95%     MDM   Final diagnoses:  Altered mental status, unspecified altered mental status type  LOC (loss of consciousness)   This is a 44 y.o. male with a history of diabetes, blindness, CVA, and seizures who presents to the emergency department after he was found on the ground after passing out while walking outside to rule out his car windows. The  patient's girlfriend reports that he walked outside on 8:30 PM to roll down his car windows and a few minutes later she noticed that the patient was lying on his back on the ground outside. She reports a the patient was unresponsive for approximately 4 minutes and then came around but seemed confused. She denies any seizure like activity.  On exam the patient is mildly confused and appears to be postictal. He has no focal neurological deficits. Is complaining of pain to his head, neck, and right knee. The patient is afebrile and nontoxic appearing. Will obtain [name], cervical spine CT, chest x-ray, and right knee x-ray. Blood work also pending. I also consulted with Neurohospitalist Dr. Hosie Poisson who would like me to continue to [name] and if that is unremarkable to move on to [name]. [name] is negative for acute intracranial process. Cervical spine CT is unremarkable. Chest x-ray is unremarkable. Right knee x-ray is unremarkable. At reevaluation the patient still is slightly confused and he believes his sister is his ex-wife. Further blood work will be obtained due to patient's altered mental status persisting longer than expected. Patient CBG is 225. Urinalysis is negative for infection. Urine drug screen is unremarkable. Troponin is negative. BMP indicates a glucose of 238 and a normal anion gap. His Depakote level is 30-the patient also just began taking Depakote. Is a negative alcohol  level. His hepatic function panel is unremarkable. He has a negative acetaminophen and sulci level. His ammonia level is only 45 which is unremarkable. No cause of his altered LOC found in work up so far. [name] is pending.  Spoke with Dr. Hosie Poisson who would the patient admitted to medicine and he will do an EEG in the morning. Dr. Julian Reil accepted the patient for admission. Family is in agreement with admission.  This patient was discussed with and evaluated by Dr. Silverio Lay who agrees with assessment and plan.  Everlene Farrier, PA-C 12/18/14 0254  Richardean Canal, MD 12/19/14 [phone]

## 2014-12-17 NOTE — ED Notes (Addendum)
Per EMS- pt and wife had just finished dinner when patient went out to the car to roll up the windows. Wife heard dogs barking at the door and she went to see what they were barking at and found the patient unconscious on the ground. Pt is confused to place and time, thinks it is July 2015 and thinks he is at [facility]. Pt c.o. Pain to ribs when he breathes, head, neck and right leg below the knee 8/10. Pt has hx of stroke and seizures.

## 2014-12-18 ENCOUNTER — Ambulatory Visit (HOSPITAL_COMMUNITY): Payer: Self-pay

## 2014-12-18 ENCOUNTER — Emergency Department (HOSPITAL_COMMUNITY): Payer: Medicaid Other

## 2014-12-18 DIAGNOSIS — R4182 Altered mental status, unspecified: Secondary | ICD-10-CM | POA: Diagnosis not present

## 2014-12-18 DIAGNOSIS — R4020 Unspecified coma: Secondary | ICD-10-CM | POA: Insufficient documentation

## 2014-12-18 DIAGNOSIS — R404 Transient alteration of awareness: Secondary | ICD-10-CM

## 2014-12-18 DIAGNOSIS — G9340 Encephalopathy, unspecified: Secondary | ICD-10-CM

## 2014-12-18 DIAGNOSIS — G629 Polyneuropathy, unspecified: Secondary | ICD-10-CM | POA: Diagnosis not present

## 2014-12-18 DIAGNOSIS — E1142 Type 2 diabetes mellitus with diabetic polyneuropathy: Secondary | ICD-10-CM | POA: Diagnosis not present

## 2014-12-18 LAB — CBC WITH DIFFERENTIAL/PLATELET
Basophils Absolute: 0 10*3/uL (ref 0.0–0.1)
Basophils Relative: 0 % (ref 0–1)
EOS ABS: 0.2 10*3/uL (ref 0.0–0.7)
Eosinophils Relative: 4 % (ref 0–5)
HEMATOCRIT: 39.8 % (ref 39.0–52.0)
HEMOGLOBIN: 14.2 g/dL (ref 13.0–17.0)
Lymphocytes Relative: 35 % (ref 12–46)
Lymphs Abs: 2.2 10*3/uL (ref 0.7–4.0)
MCH: 32 pg (ref 26.0–34.0)
MCHC: 35.7 g/dL (ref 30.0–36.0)
MCV: 89.6 fL (ref 78.0–100.0)
MONO ABS: 0.5 10*3/uL (ref 0.1–1.0)
MONOS PCT: 8 % (ref 3–12)
Neutro Abs: 3.4 10*3/uL (ref 1.7–7.7)
Neutrophils Relative %: 54 % (ref 43–77)
Platelets: 119 10*3/uL — ABNORMAL LOW (ref 150–400)
RBC: 4.44 MIL/uL (ref 4.22–5.81)
RDW: 12.1 % (ref 11.5–15.5)
WBC: 6.4 10*3/uL (ref 4.0–10.5)

## 2014-12-18 LAB — GLUCOSE, CAPILLARY
GLUCOSE-CAPILLARY: 209 mg/dL — AB (ref 65–99)
Glucose-Capillary: 223 mg/dL — ABNORMAL HIGH (ref 65–99)
Glucose-Capillary: 195 mg/dL — ABNORMAL HIGH (ref 65–99)
Glucose-Capillary: 255 mg/dL — ABNORMAL HIGH (ref 65–99)

## 2014-12-18 LAB — HEPATIC FUNCTION PANEL
ALT: 22 U/L (ref 17–63)
AST: 19 U/L (ref 15–41)
Albumin: 3.6 g/dL (ref 3.5–5.0)
Alkaline Phosphatase: 81 U/L (ref 38–126)
Bilirubin, Direct: <0.1 mg/dL — ABNORMAL LOW (ref 0.1–0.5)
TOTAL PROTEIN: 6 g/dL — AB (ref 6.5–8.1)
Total Bilirubin: 0.4 mg/dL (ref 0.3–1.2)

## 2014-12-18 LAB — BASIC METABOLIC PANEL
Anion gap: 11 (ref 5–15)
BUN: 8 mg/dL (ref 6–20)
CALCIUM: 8.7 mg/dL — AB (ref 8.9–10.3)
CO2: 21 mmol/L — AB (ref 22–32)
Chloride: 105 mmol/L (ref 101–111)
Creatinine, Ser: 0.66 mg/dL (ref 0.61–1.24)
GFR calc Af Amer: >60 mL/min (ref >60)
GFR calc non Af Amer: >60 mL/min (ref >60)
Glucose, Bld: 238 mg/dL — ABNORMAL HIGH (ref 65–99)
Potassium: 3.6 mmol/L (ref 3.5–5.1)
SODIUM: 137 mmol/L (ref 135–145)

## 2014-12-18 LAB — I-STAT TROPONIN, ED: TROPONIN I, POC: 0 ng/mL (ref 0.00–0.08)

## 2014-12-18 LAB — URINALYSIS, ROUTINE W REFLEX MICROSCOPIC
Bilirubin Urine: NEGATIVE
Glucose, UA: >1000 mg/dL — AB
Hgb urine dipstick: NEGATIVE
Ketones, ur: 15 mg/dL — AB
Leukocytes, UA: NEGATIVE
Nitrite: NEGATIVE
PROTEIN: NEGATIVE mg/dL
Specific Gravity, Urine: 1.034 — ABNORMAL HIGH (ref 1.005–1.030)
UROBILINOGEN UA: 0.2 mg/dL (ref 0.0–1.0)
pH: 6 (ref 5.0–8.0)

## 2014-12-18 LAB — AMMONIA: AMMONIA: 46 umol/L — AB (ref 9–35)

## 2014-12-18 LAB — URINE MICROSCOPIC-ADD ON

## 2014-12-18 LAB — CBG MONITORING, ED: GLUCOSE-CAPILLARY: 225 mg/dL — AB (ref 65–99)

## 2014-12-18 LAB — RAPID URINE DRUG SCREEN, HOSP PERFORMED
AMPHETAMINES: NOT DETECTED
BARBITURATES: NOT DETECTED
Benzodiazepines: NOT DETECTED
COCAINE: NOT DETECTED
Opiates: NOT DETECTED
TETRAHYDROCANNABINOL: NOT DETECTED

## 2014-12-18 LAB — VALPROIC ACID LEVEL: VALPROIC ACID LVL: 30 ug/mL — AB (ref 50.0–100.0)

## 2014-12-18 LAB — ETHANOL: Alcohol, Ethyl (B): <5 mg/dL (ref <5)

## 2014-12-18 LAB — ACETAMINOPHEN LEVEL: Acetaminophen (Tylenol), Serum: <10 ug/mL — ABNORMAL LOW (ref 10–30)

## 2014-12-18 LAB — SALICYLATE LEVEL: Salicylate Lvl: <4 mg/dL (ref 2.8–30.0)

## 2014-12-18 MED ORDER — ATORVASTATIN CALCIUM 40 MG PO TABS
40.0000 mg | ORAL_TABLET | Freq: Every day | ORAL | Status: DC
  Administered 2014-12-18: 40 mg via ORAL
  Filled 2014-12-18: qty 1

## 2014-12-18 MED ORDER — LEVETIRACETAM 500 MG PO TABS
500.0000 mg | ORAL_TABLET | Freq: Two times a day (BID) | ORAL | Status: DC
Start: 2014-12-18 — End: 2014-12-19
  Administered 2014-12-18 – 2014-12-19 (×3): 500 mg via ORAL
  Filled 2014-12-18 (×3): qty 1

## 2014-12-18 MED ORDER — PREGABALIN 75 MG PO CAPS
150.0000 mg | ORAL_CAPSULE | Freq: Two times a day (BID) | ORAL | Status: DC
  Administered 2014-12-18 – 2014-12-19 (×4): 150 mg via ORAL
  Filled 2014-12-18 (×4): qty 2

## 2014-12-18 MED ORDER — ALBUTEROL SULFATE (2.5 MG/3ML) 0.083% IN NEBU: 2.5000 mg | INHALATION_SOLUTION | Freq: Four times a day (QID) | RESPIRATORY_TRACT | Status: DC | PRN

## 2014-12-18 MED ORDER — ADULT MULTIVITAMIN W/MINERALS CH
1.0000 | ORAL_TABLET | Freq: Every day | ORAL | Status: DC
  Administered 2014-12-18 – 2014-12-19 (×2): 1 via ORAL
  Filled 2014-12-18 (×3): qty 1

## 2014-12-18 MED ORDER — FLUOXETINE HCL 20 MG PO CAPS
40.0000 mg | ORAL_CAPSULE | Freq: Every day | ORAL | Status: DC
  Administered 2014-12-18 – 2014-12-19 (×2): 40 mg via ORAL
  Filled 2014-12-18 (×3): qty 2

## 2014-12-18 MED ORDER — ASPIRIN 325 MG PO TABS
325.0000 mg | ORAL_TABLET | Freq: Every day | ORAL | Status: DC
  Administered 2014-12-18 – 2014-12-19 (×2): 325 mg via ORAL
  Filled 2014-12-18 (×2): qty 1

## 2014-12-18 MED ORDER — IBUPROFEN 200 MG PO TABS
600.0000 mg | ORAL_TABLET | Freq: Four times a day (QID) | ORAL | Status: DC | PRN
  Administered 2014-12-18: 600 mg via ORAL
  Filled 2014-12-18: qty 3

## 2014-12-18 MED ORDER — INSULIN ASPART 100 UNIT/ML ~~LOC~~ SOLN
0.0000 [IU] | Freq: Three times a day (TID) | SUBCUTANEOUS | Status: DC
  Administered 2014-12-18: 5 [IU] via SUBCUTANEOUS
  Administered 2014-12-18: 3 [IU] via SUBCUTANEOUS
  Administered 2014-12-18: 5 [IU] via SUBCUTANEOUS
  Administered 2014-12-19: 8 [IU] via SUBCUTANEOUS
  Administered 2014-12-19: 5 [IU] via SUBCUTANEOUS

## 2014-12-18 MED ORDER — METFORMIN HCL ER 500 MG PO TB24: 1000.0000 mg | ORAL_TABLET | Freq: Two times a day (BID) | ORAL | Status: DC

## 2014-12-18 MED ORDER — HEPARIN SODIUM (PORCINE) 5000 UNIT/ML IJ SOLN
5000.0000 [IU] | Freq: Three times a day (TID) | INTRAMUSCULAR | Status: DC
  Administered 2014-12-18 – 2014-12-19 (×5): 5000 [IU] via SUBCUTANEOUS
  Filled 2014-12-18 (×4): qty 1

## 2014-12-18 MED ORDER — ALBUTEROL SULFATE HFA 108 (90 BASE) MCG/ACT IN AERS: 2.0000 | INHALATION_SPRAY | Freq: Four times a day (QID) | RESPIRATORY_TRACT | Status: DC | PRN

## 2014-12-18 NOTE — Procedures (Signed)
ELECTROENCEPHALOGRAM REPORT  Patient: Miguel Diaz.       Room #: 4N22  EEG No. ID: [id number] Age: 44 y.o.        Sex: male Referring Physician: Gwenlyn Perking, C Report Date:  12/18/2014        Interpreting Physician: Aline Brochure  History: [name]. is an 44 y.o. male with recurrent spells of loss of consciousness suspicious for seizures, admitted following an episode of loss of consciousness followed by confusion.  Indications for study:  Rule out seizure disorder.  Technique: This is an 18 channel routine scalp EEG performed at the bedside with bipolar and monopolar montages arranged in accordance to the international 10/20 system of electrode placement.   Description: This EEG recording was performed during wakefulness and during sleep. Predominant activity during wakefulness consisted of 10  Hz alpha rhythm with good attenuation with eye opening. Photic stimulation produced a minimal bilateral occipital driving response.  Hyperventilation was not performed. There was symmetrical slowing of background activity with mixed irregular delta and theta activity diffusely during sleep. Symmetrical vertex waves, sleep spindles and K-complexes are recorded during stage II of sleep. No epileptiform discharges were recorded. There was no abnormal slowing of cerebral activity.  Interpretation: This is a normal EEG recording during wakefulness during wakefulness and during sleep. No evidence of an epileptic disorder was demonstrated. However, a normal EEG recording in and of itself does not rule out seizure disorder.   Venetia Maxon M.D. [facility] [phone]

## 2014-12-18 NOTE — Progress Notes (Signed)
EEG completed; results pending.    

## 2014-12-18 NOTE — Progress Notes (Signed)
Patient seen and examined. Admitted after midnight secondary to another episode of LOC and subsequent confusion. Patient went outside to close car's windows and after while patient found on the driveway confused. Patient had hx of recurrent syncope and seizures. CT head and [name] neg for acute abnormalities. Please referred to H&P  Written by Dr. Julian Reil for further info/details  Plan: -follow EEG -continue Keppra as recommended by neurology -continue supportive care -patient is slowly improving  Vassie Loll [phone]

## 2014-12-18 NOTE — ED Notes (Signed)
Pt CBG, 225. Nurse was notified.

## 2014-12-18 NOTE — Consult Note (Signed)
Consult Reason for Consult:LOC with altered mental status Referring Physician: Dr Norlene Campbell  CC: altered mental status  HPI: Miguel Diaz. is an 44 y.o. male hx of DM, blindness, HLD, stroke, possible seizures presenting after being found down outside. Per wife, he walked outside to close car windows, shortly after she found him on his back outside. She notes he was unresponsive for around 4 minutes and then slowly came to. He is currently able to interact but remains confused. He has no recollection of the event.   He is followed at [facility] for recurrent episodes of LOC. Recently started on VPA for suspected seizures. VPA level of 30 in the ED. Ammonia slightly elevated at 46. [name] imaging reviewed and overall unremarkable. Had recent EEG (done 6/02) which was unremarkable. Has been evaluated multiple times in the past for similar episodes with unclear etiology. Review of notes indicate episodes in the past seem to be triggered by going from sitting to standing or when bending over.   Past Medical History  Diagnosis Date  . Diabetes mellitus without complication   . Neuropathy   . Asthma   . Vision loss of left eye   . Blindness     bilateral  . Hypercholesteremia   . Depression   . Anxiety   . Stroke 02/2014    right sided weakness/numbness    Past Surgical History  Procedure Laterality Date  . Hernia repair    . Eye surgery Left   . Abdominal surgery      Family History  Problem Relation Age of Onset  . High blood pressure Mother   . High blood pressure Sister   . High blood pressure Paternal Grandfather     Social History:  reports that he has been smoking Cigarettes.  He has been smoking about 1.00 pack per day. He has never used smokeless tobacco. He reports that he does not drink alcohol or use illicit drugs.  Allergies  Allergen Reactions  . Penicillins Other (See Comments)    Unknown childhood reaction  . Toradol [Ketorolac Tromethamine] Hives    Medications:  Scheduled:   ROS: Out of a complete 14 system review, the patient complains of only the following symptoms, and all other reviewed systems are negative. +confusion  Physical Examination: Filed Vitals:   12/18/14 0145  BP: 122/87  Pulse: 87  Resp: 16   Physical Exam  Constitutional: He appears well-developed and well-nourished.  Psych: Affect appropriate to situation Eyes: No scleral injection HENT: No OP obstrucion Head: Normocephalic.  Cardiovascular: Normal rate and regular rhythm.  Respiratory: Effort normal and breath sounds normal.  GI: Soft. Bowel sounds are normal. No distension. There is no tenderness.  Skin: WDI  Neurologic Examination  Speech: Limited but fluent, no aphasia noted Cognition:  The patient is oriented to person, hospital, unsure of date and who family members are. Will follow simple one step commands Cranial Nerves:  The pupils are equal, round, and reactive to light. Patient has no vision in the left eye cannot see light or shadows, right eye he can see finger counting with severely limited peripheral vision. Extraocular movements are intact. The face is symmetric. Hearing intact. Voice is normal.  Gait:  deferred Motor Observation: Moves all extremities symmetrically and against light resistance. No asymmetry, no atrophy, and no involuntary movements noted. Tone: Normal muscle tone.  Sensory: PP and LT intact in all extremities Reflexes: 2+ and symmetric throughout Laboratory Studies:   Basic Metabolic Panel:  Recent  Labs Lab 12/17/14 2325  NA 137  K 3.6  CL 105  CO2 21*  GLUCOSE 238*  BUN 8  CREATININE 0.66  CALCIUM 8.7*    Liver Function Tests:  Recent Labs Lab 12/17/14 2325  AST 19  ALT 22  ALKPHOS 81  BILITOT 0.4  PROT 6.0*  ALBUMIN 3.6   No results for input(s): LIPASE, AMYLASE in the last 168 hours.  Recent Labs Lab 12/18/14 0015  AMMONIA 46*    CBC:  Recent Labs Lab 12/17/14 2325  WBC 6.4   NEUTROABS 3.4  HGB 14.2  HCT 39.8  MCV 89.6  PLT 119*    Cardiac Enzymes: No results for input(s): CKTOTAL, CKMB, CKMBINDEX, TROPONINI in the last 168 hours.  BNP: Invalid input(s): POCBNP  CBG:  Recent Labs Lab 12/18/14 0011  GLUCAP 225*    Microbiology: Results for orders placed or performed during the hospital encounter of 07/05/14  Urine culture     Status: None   Collection Time: 07/06/14  2:10 AM  Result Value Ref Range Status   Specimen Description URINE, CLEAN CATCH  Final   Special Requests Normal  Final   Colony Count NO GROWTH Performed at [facility]   Final   Culture NO GROWTH Performed at [facility]   Final   Report Status 07/07/2014 FINAL  Final    Coagulation Studies: No results for input(s): LABPROT, INR in the last 72 hours.  Urinalysis:  Recent Labs Lab 12/18/14 0004  COLORURINE YELLOW  LABSPEC 1.034*  PHURINE 6.0  GLUCOSEU >1000*  HGBUR NEGATIVE  BILIRUBINUR NEGATIVE  KETONESUR 15*  PROTEINUR NEGATIVE  UROBILINOGEN 0.2  NITRITE NEGATIVE  LEUKOCYTESUR NEGATIVE    Lipid Panel:     Component Value Date/Time   CHOL 100 05/20/2014 0434   TRIG 70 05/20/2014 0434   HDL 31* 05/20/2014 0434   CHOLHDL 3.2 05/20/2014 0434   VLDL 14 05/20/2014 0434   LDLCALC 55 05/20/2014 0434    HgbA1C:  Lab Results  Component Value Date   HGBA1C 7.5* 05/20/2014    Urine Drug Screen:     Component Value Date/Time   LABOPIA NONE DETECTED 12/18/2014 0004   COCAINSCRNUR NONE DETECTED 12/18/2014 0004   LABBENZ NONE DETECTED 12/18/2014 0004   AMPHETMU NONE DETECTED 12/18/2014 0004   THCU NONE DETECTED 12/18/2014 0004   LABBARB NONE DETECTED 12/18/2014 0004    Alcohol Level:  Recent Labs Lab 12/17/14 2325  ETH <5    Other results:  Imaging: Dg Chest 2 View  12/17/2014   CLINICAL DATA:  Loss of consciousness in the back yd. Low chest pain when taking of breath.  EXAM: CHEST  2 VIEW  COMPARISON:  09/27/2014  FINDINGS:  Shallow inspiration. The heart size and mediastinal contours are within normal limits. Both lungs are clear. The visualized skeletal structures are unremarkable.  IMPRESSION: No active cardiopulmonary disease.   Electronically Signed   By: Burman Nieves M.D.   On: 12/17/2014 22:51   Ct Head Wo Contrast  12/17/2014   CLINICAL DATA:  Initial evaluation for acute loss of consciousness  EXAM: CT HEAD WITHOUT CONTRAST  CT CERVICAL SPINE WITHOUT CONTRAST  TECHNIQUE: Multidetector CT imaging of the head and cervical spine was performed following the standard protocol without intravenous contrast. Multiplanar CT image reconstructions of the cervical spine were also generated.  COMPARISON:  Prior study from 10/11/2014  FINDINGS: CT HEAD FINDINGS  Mild cerebral atrophy for patient age. Mild chronic small vessel ischemic changes  present within the periventricular and deep white matter of both cerebral hemispheres.  No acute large vessel territory infarct identified. No intracranial hemorrhage. No mass lesion, mass effect, or midline shift. No extra-axial fluid collection. Mild prominence of the occipital horns of the lateral ventricles is stable relative to multiple prior exams. No hydrocephalus.  Several small lipomas noted within the posterior parietal scalp with underlying bony remodeling, stable from prior. Scalp soft tissues otherwise within normal limits.  Calvarium intact.  No acute abnormality about the orbits.  Moderate opacity present within the left frontoethmoidal recess. Mild scattered mucosal thickening present within the ethmoidal air cells. Mild polypoid opacity present within the right sphenoid sinus. No mastoid effusion. Middle ear cavities are clear.  CT CERVICAL SPINE FINDINGS  The vertebral bodies are normally aligned with preservation of the normal cervical lordosis. Vertebral body heights are preserved. Normal C1-2 articulations are intact. No prevertebral soft tissue swelling. No acute fracture or  listhesis.  Mild multilevel degenerative disc disease present, most 7 at C7-T1. No significant canal or foraminal stenosis appreciated.  Visualized soft tissues of the neck are within normal limits. Visualized lung apices are clear without evidence of apical pneumothorax.  IMPRESSION: CT BRAIN:  1. No acute intracranial process. 2. Mild atrophy with chronic small vessel ischemic disease. 3. Mild paranasal sinus disease as above, most prominent within the left frontoethmoidal recess. 4. Scattered small soft tissue lipomas within the bilateral parietal scalp with underlying bony remodeling, stable.  CT CERVICAL SPINE:  No acute traumatic injury within the cervical spine.   Electronically Signed   By: Rise Mu M.D.   On: 12/17/2014 23:33   Ct Cervical Spine Wo Contrast  12/17/2014   CLINICAL DATA:  Initial evaluation for acute loss of consciousness  EXAM: CT HEAD WITHOUT CONTRAST  CT CERVICAL SPINE WITHOUT CONTRAST  TECHNIQUE: Multidetector CT imaging of the head and cervical spine was performed following the standard protocol without intravenous contrast. Multiplanar CT image reconstructions of the cervical spine were also generated.  COMPARISON:  Prior study from 10/11/2014  FINDINGS: CT HEAD FINDINGS  Mild cerebral atrophy for patient age. Mild chronic small vessel ischemic changes present within the periventricular and deep white matter of both cerebral hemispheres.  No acute large vessel territory infarct identified. No intracranial hemorrhage. No mass lesion, mass effect, or midline shift. No extra-axial fluid collection. Mild prominence of the occipital horns of the lateral ventricles is stable relative to multiple prior exams. No hydrocephalus.  Several small lipomas noted within the posterior parietal scalp with underlying bony remodeling, stable from prior. Scalp soft tissues otherwise within normal limits.  Calvarium intact.  No acute abnormality about the orbits.  Moderate opacity present  within the left frontoethmoidal recess. Mild scattered mucosal thickening present within the ethmoidal air cells. Mild polypoid opacity present within the right sphenoid sinus. No mastoid effusion. Middle ear cavities are clear.  CT CERVICAL SPINE FINDINGS  The vertebral bodies are normally aligned with preservation of the normal cervical lordosis. Vertebral body heights are preserved. Normal C1-2 articulations are intact. No prevertebral soft tissue swelling. No acute fracture or listhesis.  Mild multilevel degenerative disc disease present, most 7 at C7-T1. No significant canal or foraminal stenosis appreciated.  Visualized soft tissues of the neck are within normal limits. Visualized lung apices are clear without evidence of apical pneumothorax.  IMPRESSION: CT BRAIN:  1. No acute intracranial process. 2. Mild atrophy with chronic small vessel ischemic disease. 3. Mild paranasal sinus disease as above, most  prominent within the left frontoethmoidal recess. 4. Scattered small soft tissue lipomas within the bilateral parietal scalp with underlying bony remodeling, stable.  CT CERVICAL SPINE:  No acute traumatic injury within the cervical spine.   Electronically Signed   By: Rise Mu M.D.   On: 12/17/2014 23:33   Dg Knee Complete 4 Views Right  12/17/2014   CLINICAL DATA:  Loss of consciousness.  Knee pain.  EXAM: RIGHT KNEE - COMPLETE 4+ VIEW  COMPARISON:  02/23/2014  FINDINGS: There is no evidence of fracture, dislocation, or joint effusion. There is no evidence of arthropathy or other focal bone abnormality. Soft tissues are unremarkable.  IMPRESSION: Negative.   Electronically Signed   By: Burman Nieves M.D.   On: 12/17/2014 22:53     Assessment/Plan: 43y/o hx of DM, HLD, blindness, CVA presenting with loss of consciousness with prolonged confusion post event. Has been evaluated multiple times in the past for similar episodes with unclear etiology. Prolonged confusion this time is atypical  though. Patient is oriented x 2 and following simple commands so low suspicion for NCSE.   -EEG -check orthostatic vitals -hold depakote for now due to elevated ammonia -start keppra  BID  Elspeth Cho, DO [facility] [phone]  If 7pm- 7am, please page neurology on call as listed in AMION. 12/18/2014, 2:14 AM

## 2014-12-18 NOTE — H&P (Signed)
[facility] History and Physical  Miguel Diaz. VWU:[id number] DOB: [birth year] DOA: 12/17/2014  Referring physician: EDP PCP: Sherilyn Banker, MD   Chief Complaint: AMS   HPI: Miguel Diaz. is a 44 y.o. male h/o DM, blindness, HLD, stroke, possible seizures.  Patient presents to the ED after being found down outside.  Per his wife, he walked outside to close car windows.  Shortly after she found him on his back outside.  Unresponsive for about 4 mins before slowly coming to.  He is currently able to interact but is confused.  No memory of event.  Followed at [facility] for recurrent episodes of LOC, recently started on VPA for suspected seizures.  VPA level of 30 in ED, ammonia slightly elevated at 46.  CT head negative.  Multiple LOC events but the prolonged confusion state is new for him.  Review of Systems: Systems reviewed.  As above, otherwise negative  Past Medical History  Diagnosis Date  . Diabetes mellitus without complication   . Neuropathy   . Asthma   . Vision loss of left eye   . Blindness     bilateral  . Hypercholesteremia   . Depression   . Anxiety   . Stroke 02/2014    right sided weakness/numbness   Past Surgical History  Procedure Laterality Date  . Hernia repair    . Eye surgery Left   . Abdominal surgery     Social History:  reports that he has been smoking Cigarettes.  He has been smoking about 1.00 pack per day. He has never used smokeless tobacco. He reports that he does not drink alcohol or use illicit drugs.  Allergies  Allergen Reactions  . Penicillins Other (See Comments)    Unknown childhood reaction  . Toradol [Ketorolac Tromethamine] Hives    Family History  Problem Relation Age of Onset  . High blood pressure Mother   . High blood pressure Sister   . High blood pressure Paternal Grandfather      Prior to Admission medications   Medication Sig Start Date End Date Taking? Authorizing Provider  albuterol (PROVENTIL  HFA;VENTOLIN HFA) 108 (90 BASE) MCG/ACT inhaler Inhale 2 puffs into the lungs every 6 (six) hours as needed for wheezing or shortness of breath.   Yes Historical Provider, MD  aspirin 325 MG tablet Take 1 tablet (325 mg total) by mouth daily. 05/21/14  Yes Joseph Art, DO  atorvastatin (LIPITOR) 40 MG tablet Take 40 mg by mouth at bedtime.    Yes Historical Provider, MD  divalproex (DEPAKOTE ER) 500 MG 24 hr tablet Take 1 tablet (500 mg total) by mouth at bedtime. 11/25/14  Yes Anson Fret, MD  FLUoxetine (PROZAC) 40 MG capsule Take 40 mg by mouth daily.   Yes Historical Provider, MD  ibuprofen (ADVIL,MOTRIN) 600 MG tablet Take 600 mg by mouth every 6 (six) hours as needed for moderate pain.   Yes Historical Provider, MD  metFORMIN (GLUCOPHAGE-XR) 500 MG 24 hr tablet Take 1,000 mg by mouth 2 (two) times daily.   Yes Historical Provider, MD  Multiple Vitamins-Minerals (MULTIVITAMIN WITH MINERALS) tablet Take 1 tablet by mouth daily.   Yes Historical Provider, MD  pregabalin (LYRICA) 150 MG capsule Take 150 mg by mouth 2 (two) times daily.   Yes Historical Provider, MD   Physical Exam: Filed Vitals:   12/18/14 0248  BP: 124/87  Pulse: 80  Resp: 16    BP 124/87 mmHg  Pulse 80  Resp 16  SpO2 95%  General Appearance:    Alert, oriented x2, no distress, appears stated age  Head:    Normocephalic, atraumatic  Eyes:    PERRL, EOMI, sclera non-icteric        Nose:   Nares without drainage or epistaxis. Mucosa, turbinates normal  Throat:   Moist mucous membranes. Oropharynx without erythema or exudate.  Neck:   Supple. No carotid bruits.  No thyromegaly.  No lymphadenopathy.   Back:     No CVA tenderness, no spinal tenderness  Lungs:     Clear to auscultation bilaterally, without wheezes, rhonchi or rales  Chest wall:    No tenderness to palpitation  Heart:    Regular rate and rhythm without murmurs, gallops, rubs  Abdomen:     Soft, non-tender, nondistended, normal bowel sounds, no  organomegaly  Genitalia:    deferred  Rectal:    deferred  Extremities:   No clubbing, cyanosis or edema.  Pulses:   2+ and symmetric all extremities  Skin:   Skin color, texture, turgor normal, no rashes or lesions  Lymph nodes:   Cervical, supraclavicular, and axillary nodes normal  Neurologic:   CNII-XII intact. Normal strength, sensation and reflexes      throughout    Labs on Admission:  Basic Metabolic Panel:  Recent Labs Lab 12/17/14 2325  NA 137  K 3.6  CL 105  CO2 21*  GLUCOSE 238*  BUN 8  CREATININE 0.66  CALCIUM 8.7*   Liver Function Tests:  Recent Labs Lab 12/17/14 2325  AST 19  ALT 22  ALKPHOS 81  BILITOT 0.4  PROT 6.0*  ALBUMIN 3.6   No results for input(s): LIPASE, AMYLASE in the last 168 hours.  Recent Labs Lab 12/18/14 0015  AMMONIA 46*   CBC:  Recent Labs Lab 12/17/14 2325  WBC 6.4  NEUTROABS 3.4  HGB 14.2  HCT 39.8  MCV 89.6  PLT 119*   Cardiac Enzymes: No results for input(s): CKTOTAL, CKMB, CKMBINDEX, TROPONINI in the last 168 hours.  BNP (last 3 results) No results for input(s): PROBNP in the last 8760 hours. CBG:  Recent Labs Lab 12/18/14 0011  GLUCAP 225*    Radiological Exams on Admission: Dg Chest 2 View  12/17/2014   CLINICAL DATA:  Loss of consciousness in the back yd. Low chest pain when taking of breath.  EXAM: CHEST  2 VIEW  COMPARISON:  09/27/2014  FINDINGS: Shallow inspiration. The heart size and mediastinal contours are within normal limits. Both lungs are clear. The visualized skeletal structures are unremarkable.  IMPRESSION: No active cardiopulmonary disease.   Electronically Signed   By: Burman Nieves M.D.   On: 12/17/2014 22:51   Ct Head Wo Contrast  12/17/2014   CLINICAL DATA:  Initial evaluation for acute loss of consciousness  EXAM: CT HEAD WITHOUT CONTRAST  CT CERVICAL SPINE WITHOUT CONTRAST  TECHNIQUE: Multidetector CT imaging of the head and cervical spine was performed following the standard  protocol without intravenous contrast. Multiplanar CT image reconstructions of the cervical spine were also generated.  COMPARISON:  Prior study from 10/11/2014  FINDINGS: CT HEAD FINDINGS  Mild cerebral atrophy for patient age. Mild chronic small vessel ischemic changes present within the periventricular and deep white matter of both cerebral hemispheres.  No acute large vessel territory infarct identified. No intracranial hemorrhage. No mass lesion, mass effect, or midline shift. No extra-axial fluid collection. Mild prominence of the occipital horns of the lateral ventricles is stable  relative to multiple prior exams. No hydrocephalus.  Several small lipomas noted within the posterior parietal scalp with underlying bony remodeling, stable from prior. Scalp soft tissues otherwise within normal limits.  Calvarium intact.  No acute abnormality about the orbits.  Moderate opacity present within the left frontoethmoidal recess. Mild scattered mucosal thickening present within the ethmoidal air cells. Mild polypoid opacity present within the right sphenoid sinus. No mastoid effusion. Middle ear cavities are clear.  CT CERVICAL SPINE FINDINGS  The vertebral bodies are normally aligned with preservation of the normal cervical lordosis. Vertebral body heights are preserved. Normal C1-2 articulations are intact. No prevertebral soft tissue swelling. No acute fracture or listhesis.  Mild multilevel degenerative disc disease present, most 7 at C7-T1. No significant canal or foraminal stenosis appreciated.  Visualized soft tissues of the neck are within normal limits. Visualized lung apices are clear without evidence of apical pneumothorax.  IMPRESSION: CT BRAIN:  1. No acute intracranial process. 2. Mild atrophy with chronic small vessel ischemic disease. 3. Mild paranasal sinus disease as above, most prominent within the left frontoethmoidal recess. 4. Scattered small soft tissue lipomas within the bilateral parietal scalp  with underlying bony remodeling, stable.  CT CERVICAL SPINE:  No acute traumatic injury within the cervical spine.   Electronically Signed   By: Rise Mu M.D.   On: 12/17/2014 23:33   Ct Cervical Spine Wo Contrast  12/17/2014   CLINICAL DATA:  Initial evaluation for acute loss of consciousness  EXAM: CT HEAD WITHOUT CONTRAST  CT CERVICAL SPINE WITHOUT CONTRAST  TECHNIQUE: Multidetector CT imaging of the head and cervical spine was performed following the standard protocol without intravenous contrast. Multiplanar CT image reconstructions of the cervical spine were also generated.  COMPARISON:  Prior study from 10/11/2014  FINDINGS: CT HEAD FINDINGS  Mild cerebral atrophy for patient age. Mild chronic small vessel ischemic changes present within the periventricular and deep white matter of both cerebral hemispheres.  No acute large vessel territory infarct identified. No intracranial hemorrhage. No mass lesion, mass effect, or midline shift. No extra-axial fluid collection. Mild prominence of the occipital horns of the lateral ventricles is stable relative to multiple prior exams. No hydrocephalus.  Several small lipomas noted within the posterior parietal scalp with underlying bony remodeling, stable from prior. Scalp soft tissues otherwise within normal limits.  Calvarium intact.  No acute abnormality about the orbits.  Moderate opacity present within the left frontoethmoidal recess. Mild scattered mucosal thickening present within the ethmoidal air cells. Mild polypoid opacity present within the right sphenoid sinus. No mastoid effusion. Middle ear cavities are clear.  CT CERVICAL SPINE FINDINGS  The vertebral bodies are normally aligned with preservation of the normal cervical lordosis. Vertebral body heights are preserved. Normal C1-2 articulations are intact. No prevertebral soft tissue swelling. No acute fracture or listhesis.  Mild multilevel degenerative disc disease present, most 7 at C7-T1.  No significant canal or foraminal stenosis appreciated.  Visualized soft tissues of the neck are within normal limits. Visualized lung apices are clear without evidence of apical pneumothorax.  IMPRESSION: CT BRAIN:  1. No acute intracranial process. 2. Mild atrophy with chronic small vessel ischemic disease. 3. Mild paranasal sinus disease as above, most prominent within the left frontoethmoidal recess. 4. Scattered small soft tissue lipomas within the bilateral parietal scalp with underlying bony remodeling, stable.  CT CERVICAL SPINE:  No acute traumatic injury within the cervical spine.   Electronically Signed   By: Janell Quiet.D.  On: 12/17/2014 23:33   Dg Knee Complete 4 Views Right  12/17/2014   CLINICAL DATA:  Loss of consciousness.  Knee pain.  EXAM: RIGHT KNEE - COMPLETE 4+ VIEW  COMPARISON:  02/23/2014  FINDINGS: There is no evidence of fracture, dislocation, or joint effusion. There is no evidence of arthropathy or other focal bone abnormality. Soft tissues are unremarkable.  IMPRESSION: Negative.   Electronically Signed   By: Burman Nieves M.D.   On: 12/17/2014 22:53    EKG: Independently reviewed.  Assessment/Plan Principal Problem:   Encephalopathy Active Problems:   DM type 2 with diabetic peripheral neuropathy   1. Encephalopathy - suspected prolonged post-ictal confusion state. 1. EEG 2. Holding depakote 3. keppra 500mg  BID 2. DM2 - continue home meds    Code Status: Full Code  Family Communication: Wife at bedside Disposition Plan: Admit to inpatient   Time spent: 50 min  GARDNER, JARED M. [facility] Pager [phone]  If 7AM-7PM, please contact the day team taking care of the patient [email or web address] Password [facility] 12/18/2014, 3:19 AM

## 2014-12-18 NOTE — Progress Notes (Signed)
Subjective: The patient's wife is at bedside. The patient is very sleepy but arouses. He follows most commands with prompting. He remains somewhat confused. Questions regarding elevated ammonia levels and change from Depakote to Keppra answered. The patient states that he has an occipital headache. His wife reported that he had a lump in the occipital area earlier but this has since resolved. On a 1-10 scale the patient estimates his headache is a 4 to a 10.   Objective: Current vital signs: BP 112/73 mmHg  Pulse 77  Temp(Src) 97.6 F (36.4 C) (Oral)  Resp 17  Ht  (1.727 m)  Wt 87.3 kg (192 lb 7.4 oz)  BMI 29.27 kg/m2  SpO2 96% Vital signs in last 24 hours: Temp:  [97.6 F (36.4 C)-97.9 F (36.6 C)] 97.6 F (36.4 C) (06/15 0500) Pulse Rate:  [69-94] 77 (06/15 0500) Resp:  [14-18] 17 (06/15 0500) BP: (112-135)/(73-97) 112/73 mmHg (06/15 0500) SpO2:  [90 %-98 %] 96 % (06/15 0500) Weight:  [87.3 kg (192 lb 7.4 oz)] 87.3 kg (192 lb 7.4 oz) (06/15 0424)  Intake/Output from previous day:   Intake/Output this shift: Total I/O In: 240 [P.O.:240] Out: -  Nutritional status: Diet Carb Modified Fluid consistency:: Thin; Room service appropriate?: Yes  Physical Exam  General - 44 year old male lethargic but arousable  Head - atraumatic  Heart - Regular rate and rhythm - distant Lungs - Clear to auscultation anteriorly with poor inspiratory effort Abdomen - Soft - non tender Skin - Warm and dry  Neurologic Exam:  MENTAL STATUS: Awake but not alert. Language fluent Follows simple commands. Falls asleep easily. He thinks he is in [employer]. He thinks the year is 33. He believes the month is June or July. CRANIAL NERVES: The patient tends to keep both eyes closed. Pupils equal at approximately 4 mm and sluggish. The patient is legally blind. Facial sensation and strength symmetric, tongue midline.  MOTOR: normal bulk and tone, full strength in the BUE, BLE  SENSORY: normal  and symmetric to light touch  COORDINATION: finger-nose-finger normal  GAIT/STATION: - Deferred   Lab Results: Basic Metabolic Panel:  Recent Labs Lab 12/17/14 2325  NA 137  K 3.6  CL 105  CO2 21*  GLUCOSE 238*  BUN 8  CREATININE 0.66  CALCIUM 8.7*    Liver Function Tests:  Recent Labs Lab 12/17/14 2325  AST 19  ALT 22  ALKPHOS 81  BILITOT 0.4  PROT 6.0*  ALBUMIN 3.6   No results for input(s): LIPASE, AMYLASE in the last 168 hours.  Recent Labs Lab 12/18/14 0015  AMMONIA 46*    CBC:  Recent Labs Lab 12/17/14 2325  WBC 6.4  NEUTROABS 3.4  HGB 14.2  HCT 39.8  MCV 89.6  PLT 119*    Cardiac Enzymes: No results for input(s): CKTOTAL, CKMB, CKMBINDEX, TROPONINI in the last 168 hours.  Lipid Panel: No results for input(s): CHOL, TRIG, HDL, CHOLHDL, VLDL, LDLCALC in the last 168 hours.  CBG:  Recent Labs Lab 12/18/14 0011 12/18/14 0639  GLUCAP 225* 223*    Microbiology: Results for orders placed or performed during the hospital encounter of 07/05/14  Urine culture     Status: None   Collection Time: 07/06/14  2:10 AM  Result Value Ref Range Status   Specimen Description URINE, CLEAN CATCH  Final   Special Requests Normal  Final   Colony Count NO GROWTH Performed at [facility]   Final   Culture NO GROWTH  Performed at [facility]   Final   Report Status 07/07/2014 FINAL  Final    Coagulation Studies: No results for input(s): LABPROT, INR in the last 72 hours.    Imaging:   Dg Chest 2 View 12/17/2014    No active cardiopulmonary disease.     Ct Head Wo Contrast 12/17/2014    1. No acute intracranial process.   2. Mild atrophy with chronic small vessel ischemic disease.  3. Mild paranasal sinus disease as above, most prominent within the left frontoethmoidal recess.  4. Scattered small soft tissue lipomas within the bilateral parietal scalp with underlying bony remodeling, stable.    Ct Cervical Spine Wo  Contrast 12/17/2014    No acute traumatic injury within the cervical spine.      Mr Brain Wo Contrast (neuro Protocol) 12/18/2014    1. No acute intracranial infarct or other process identified.  2. Mild cerebral atrophy with mild to moderate chronic microvascular ischemic disease.     Dg Knee Complete 4 Views Right 12/17/2014    Negative.      Medications:  Scheduled: . aspirin  325 mg Oral Daily  . atorvastatin  40 mg Oral QHS  . FLUoxetine  40 mg Oral Daily  . heparin  5,000 Units Subcutaneous 3 times per day  . insulin aspart  0-15 Units Subcutaneous TID WC  . levETIRAcetam  500 mg Oral BID  . multivitamin with minerals  1 tablet Oral Daily  . pregabalin  150 mg Oral BID    Assessment/Plan: 44 year old male with a history of syncope felt to be secondary to seizures. He has been evaluated by cardiology in the past with no etiology being found. Admitted early this morning after he was found on his back outside by his wife after the patient had gone out to close the car windows. He remains confused. [name] and [name] negative for any acute findings. Depakote changed to Keppra secondary to elevated ammonia levels.   Delton See PA-C [facility] Pager [phone] 12/18/2014, 9:28 AM   I have seen and evaluated the patient. I have reviewed the above note and made appropriate changes.   44 yo M With stereotyped episodes of LOC with post-ictal confusion. I do think that seizures are possible and his depakote level was low on admission. I do not think the ammonia seen is likely to be contributory to his mental status, but agree that a change to keppra was reasonable. He is already on lyrica aswell which is also an anti-epileptic medication.   1) EEG 2) Keppra 500mg  BID 3) continue lyrical 150mg  BID   Ritta Slot, MD [facility] [phone]  If 7pm- 7am, please page neurology on call as listed in AMION.

## 2014-12-18 NOTE — Progress Notes (Signed)
Inpatient Diabetes Program Recommendations  AACE/ADA: New Consensus Statement on Inpatient Glycemic Control (2013)  Target Ranges:  Prepandial:   less than 140 mg/dL      Peak postprandial:   less than 180 mg/dL (1-2 hours)      Critically ill patients:  140 - 180 mg/dL   Reason for Visit: Hyperglycemia Outpatient Diabetes medications: metformin 1000 mg bid Current orders for Inpatient glycemic control: Novolog moderate tidwc  Results for JABRAYLEN, KRAMPITZ (MRN [id number]) as of 12/18/2014 15:17  Ref. Range 12/18/2014 00:11 12/18/2014 06:39 12/18/2014 11:59  Glucose-Capillary Latest Ref Range: 65-99 mg/dL 355 (H) 732 (H) 202 (H)   Blood sugars > 180 mg/dL. HgbA1C pending.  Recommendations:  Add Lantus 12 units QHS Add HS correction.  Will follow. Thank you. Ailene Ards, RD, LDN, CDE Inpatient Diabetes Coordinator [phone]

## 2014-12-19 ENCOUNTER — Emergency Department (HOSPITAL_COMMUNITY)
Admission: EM | Admit: 2014-12-19 | Discharge: 2014-12-20 | Disposition: A | Discharge status: Home / Self Care | Payer: Medicaid Other | Attending: Emergency Medicine | Admitting: Emergency Medicine

## 2014-12-19 ENCOUNTER — Encounter (HOSPITAL_COMMUNITY): Payer: Self-pay | Admitting: Emergency Medicine

## 2014-12-19 DIAGNOSIS — Z8673 Personal history of transient ischemic attack (TIA), and cerebral infarction without residual deficits: Secondary | ICD-10-CM | POA: Diagnosis not present

## 2014-12-19 DIAGNOSIS — G629 Polyneuropathy, unspecified: Secondary | ICD-10-CM

## 2014-12-19 DIAGNOSIS — H540 Blindness, both eyes: Secondary | ICD-10-CM | POA: Insufficient documentation

## 2014-12-19 DIAGNOSIS — E780 Pure hypercholesterolemia: Secondary | ICD-10-CM | POA: Insufficient documentation

## 2014-12-19 DIAGNOSIS — E119 Type 2 diabetes mellitus without complications: Secondary | ICD-10-CM | POA: Diagnosis not present

## 2014-12-19 DIAGNOSIS — Z7982 Long term (current) use of aspirin: Secondary | ICD-10-CM | POA: Insufficient documentation

## 2014-12-19 DIAGNOSIS — F419 Anxiety disorder, unspecified: Secondary | ICD-10-CM | POA: Diagnosis not present

## 2014-12-19 DIAGNOSIS — Z720 Tobacco use: Secondary | ICD-10-CM | POA: Insufficient documentation

## 2014-12-19 DIAGNOSIS — Z79899 Other long term (current) drug therapy: Secondary | ICD-10-CM | POA: Insufficient documentation

## 2014-12-19 DIAGNOSIS — R569 Unspecified convulsions: Secondary | ICD-10-CM | POA: Diagnosis present

## 2014-12-19 DIAGNOSIS — Z880 Allergy status to penicillin: Secondary | ICD-10-CM | POA: Diagnosis not present

## 2014-12-19 DIAGNOSIS — F329 Major depressive disorder, single episode, unspecified: Secondary | ICD-10-CM | POA: Diagnosis not present

## 2014-12-19 DIAGNOSIS — G40909 Epilepsy, unspecified, not intractable, without status epilepticus: Secondary | ICD-10-CM | POA: Insufficient documentation

## 2014-12-19 DIAGNOSIS — J45909 Unspecified asthma, uncomplicated: Secondary | ICD-10-CM | POA: Insufficient documentation

## 2014-12-19 DIAGNOSIS — E785 Hyperlipidemia, unspecified: Secondary | ICD-10-CM

## 2014-12-19 DIAGNOSIS — G9340 Encephalopathy, unspecified: Secondary | ICD-10-CM | POA: Diagnosis not present

## 2014-12-19 DIAGNOSIS — E1142 Type 2 diabetes mellitus with diabetic polyneuropathy: Secondary | ICD-10-CM

## 2014-12-19 LAB — COMPREHENSIVE METABOLIC PANEL
ALBUMIN: 3.2 g/dL — AB (ref 3.5–5.0)
ALBUMIN: 3.7 g/dL (ref 3.5–5.0)
ALT: 26 U/L (ref 17–63)
ALT: 28 U/L (ref 17–63)
AST: 20 U/L (ref 15–41)
AST: 23 U/L (ref 15–41)
Alkaline Phosphatase: 75 U/L (ref 38–126)
Alkaline Phosphatase: 82 U/L (ref 38–126)
Anion gap: 9 (ref 5–15)
Anion gap: 11 (ref 5–15)
BUN: 8 mg/dL (ref 6–20)
BUN: 10 mg/dL (ref 6–20)
CALCIUM: 9.2 mg/dL (ref 8.9–10.3)
CHLORIDE: 101 mmol/L (ref 101–111)
CO2: 27 mmol/L (ref 22–32)
CO2: 22 mmol/L (ref 22–32)
Calcium: 8.4 mg/dL — ABNORMAL LOW (ref 8.9–10.3)
Chloride: 105 mmol/L (ref 101–111)
Creatinine, Ser: 0.71 mg/dL (ref 0.61–1.24)
Creatinine, Ser: 0.7 mg/dL (ref 0.61–1.24)
GFR calc Af Amer: >60 mL/min (ref >60)
GFR calc non Af Amer: >60 mL/min (ref >60)
GLUCOSE: 245 mg/dL — AB (ref 65–99)
Glucose, Bld: 234 mg/dL — ABNORMAL HIGH (ref 65–99)
POTASSIUM: 3.7 mmol/L (ref 3.5–5.1)
Potassium: 4.2 mmol/L (ref 3.5–5.1)
SODIUM: 137 mmol/L (ref 135–145)
Sodium: 138 mmol/L (ref 135–145)
Total Bilirubin: 0.6 mg/dL (ref 0.3–1.2)
Total Bilirubin: 0.5 mg/dL (ref 0.3–1.2)
Total Protein: 5.7 g/dL — ABNORMAL LOW (ref 6.5–8.1)
Total Protein: 6.6 g/dL (ref 6.5–8.1)

## 2014-12-19 LAB — HEMOGLOBIN A1C
Hgb A1c MFr Bld: 10.2 % — ABNORMAL HIGH (ref 4.8–5.6)
MEAN PLASMA GLUCOSE: 246 mg/dL

## 2014-12-19 LAB — CBC
HCT: 43.8 % (ref 39.0–52.0)
Hemoglobin: 16 g/dL (ref 13.0–17.0)
MCH: 33 pg (ref 26.0–34.0)
MCHC: 36.5 g/dL — ABNORMAL HIGH (ref 30.0–36.0)
MCV: 90.3 fL (ref 78.0–100.0)
Platelets: 127 10*3/uL — ABNORMAL LOW (ref 150–400)
RBC: 4.85 MIL/uL (ref 4.22–5.81)
RDW: 11.9 % (ref 11.5–15.5)
WBC: 8.2 10*3/uL (ref 4.0–10.5)

## 2014-12-19 LAB — AMMONIA: Ammonia: 36 umol/L — ABNORMAL HIGH (ref 9–35)

## 2014-12-19 LAB — GLUCOSE, CAPILLARY
GLUCOSE-CAPILLARY: 217 mg/dL — AB (ref 65–99)
GLUCOSE-CAPILLARY: 272 mg/dL — AB (ref 65–99)

## 2014-12-19 LAB — CBG MONITORING, ED: Glucose-Capillary: 261 mg/dL — ABNORMAL HIGH (ref 65–99)

## 2014-12-19 MED ORDER — SODIUM CHLORIDE 0.9 % IV SOLN
1000.0000 mg | Freq: Once | INTRAVENOUS | Status: AC
  Administered 2014-12-19: 1000 mg via INTRAVENOUS
  Filled 2014-12-19: qty 10

## 2014-12-19 MED ORDER — PREGABALIN 150 MG PO CAPS: 150.0000 mg | ORAL_CAPSULE | Freq: Two times a day (BID) | ORAL | Status: DC

## 2014-12-19 MED ORDER — LEVETIRACETAM 500 MG PO TABS: 500.0000 mg | ORAL_TABLET | Freq: Two times a day (BID) | ORAL | Status: DC

## 2014-12-19 NOTE — Discharge Summary (Signed)
Physician Discharge Summary  Miguel Diaz. ZOX:[id number] DOB: 13-Oct-1970 DOA: 12/17/2014  PCP: Sherilyn Banker, MD  Admit date: 12/17/2014 Discharge date: 12/19/2014  Time spent: >30 minutes  Recommendations for Outpatient Follow-up:  BMET to follow electrolytes and renal function  Discharge Diagnoses:  Principal Problem:   Encephalopathy Active Problems:   DM type 2 with diabetic peripheral neuropathy   Encephalopathy acute   Altered mental status   LOC (loss of consciousness)   Discharge Condition: Stable and improved. Patient has been discharged home with instructions to follow up with PCP in 2-3 weeks for hospital follow-up and in a bowel 14 days with his neurologist for further adjustment on his antiepileptic drugs as needed.  Diet recommendation: Heart healthy low carbohydrate diet  Filed Weights   12/18/14 0424  Weight: 87.3 kg (192 lb 7.4 oz)    History of present illness:  44 y.o. male h/o DM, blindness, HLD, stroke, possible seizures. Patient presents to the ED after being found down outside. Per his wife, he walked outside to close car windows. Shortly after she found him on his back outside. Unresponsive for about 4 mins before slowly coming to. He is currently able to interact but is confused. No memory of event.  Hospital Course:  1-Acute encephalopathy: Appears to be secondary to post ictal due to seizure activity. -EEG was unremarkable -CT/[name] also failed to demonstrate any acute intracranial abnormalities -Mild elevation on his ammonia level and low Depakote levels -After discussing with neurology patient has been transitioned to Keppra 500 mg twice a day and at the moment of discharge his mentation is back to baseline without any further seizure activity appreciated. -Patient will also continue the use of Lyrica (medication that he is on mainly for diabetic neuropathy) which can also sees as part of antiepileptic drug regimen  2-hyperlipidemia: Will  continue statins  3-diabetes mellitus type 2 with neuropathy -Will continue metformin and Lyrica -patient to follow up with PCP for adjustment to hypoglycemic regimen as needed  4-depression: Continue fluoxetine  5-history of asthma: No wheezing, no shortness of breath -Continue as needed albuterol  6-prior history of stroke: No acute neurologic deficit appreciated. -Will continue risk factors modifications and aspirin for secondary prevention.  Procedures:  EEG: Per report was a normal study.  Consultations:  Neurology  Discharge Exam: Filed Vitals:   12/19/14 1433  BP: 106/70  Pulse: 79  Temp: 98 F (36.7 C)  Resp: 18    General: No acute distress, afebrile. Patient mentation back to baseline according to wife. He is coming conversant, alert, oriented 3. Cardiovascular: S1 and S2, no rubs, no gallops. Respiratory: Clear to auscultation bilaterally Abdomen: Soft, nontender, nondistended, positive bowel sounds Extremities: No edema, no cyanosis no clubbing  Discharge Instructions   Discharge Instructions    Discharge instructions    Complete by:  As directed   Follow up with neurologhit (in 2 weeks) Take medications as prescribed Follow heart healthy Keep herself well-hydrated          Current Discharge Medication List    START taking these medications   Details  levETIRAcetam (KEPPRA) 500 MG tablet Take 1 tablet (500 mg total) by mouth 2 (two) times daily. Qty: 60 tablet, Refills: 1      CONTINUE these medications which have CHANGED   Details  pregabalin (LYRICA) 150 MG capsule Take 1 capsule (150 mg total) by mouth 2 (two) times daily. Qty: 60 capsule, Refills: 1      CONTINUE these medications which  have NOT CHANGED   Details  albuterol (PROVENTIL HFA;VENTOLIN HFA) 108 (90 BASE) MCG/ACT inhaler Inhale 2 puffs into the lungs every 6 (six) hours as needed for wheezing or shortness of breath.    aspirin 325 MG tablet Take 1 tablet (325 mg total)  by mouth daily. Qty: 30 tablet, Refills: 0    atorvastatin (LIPITOR) 40 MG tablet Take 40 mg by mouth at bedtime.     FLUoxetine (PROZAC) 40 MG capsule Take 40 mg by mouth daily.    ibuprofen (ADVIL,MOTRIN) 600 MG tablet Take 600 mg by mouth every 6 (six) hours as needed for moderate pain.    metFORMIN (GLUCOPHAGE-XR) 500 MG 24 hr tablet Take 1,000 mg by mouth 2 (two) times daily.    Multiple Vitamins-Minerals (MULTIVITAMIN WITH MINERALS) tablet Take 1 tablet by mouth daily.      STOP taking these medications     divalproex (DEPAKOTE ER) 500 MG 24 hr tablet        Allergies  Allergen Reactions  . Penicillins Other (See Comments)    Unknown childhood reaction  . Toradol [Ketorolac Tromethamine] Hives   Follow-up Information    Follow up with Lesly Dukes, MD. Schedule an appointment as soon as possible for a visit in 2 weeks.   Specialty:  Neurology   Contact information:   [address] [place] [address] [phone]       Follow up with [facility], ISA, MD. Schedule an appointment as soon as possible for a visit in 3 weeks.   Specialty:  Family Medicine   Why:  sooner if needed   Contact information:   [address] [place] [address] [phone]       The results of significant diagnostics from this hospitalization (including imaging, microbiology, ancillary and laboratory) are listed below for reference.    Significant Diagnostic Studies: Dg Chest 2 View  12/17/2014   CLINICAL DATA:  Loss of consciousness in the back yd. Low chest pain when taking of breath.  EXAM: CHEST  2 VIEW  COMPARISON:  09/27/2014  FINDINGS: Shallow inspiration. The heart size and mediastinal contours are within normal limits. Both lungs are clear. The visualized skeletal structures are unremarkable.  IMPRESSION: No active cardiopulmonary disease.   Electronically Signed   By: Burman Nieves M.D.   On: 12/17/2014 22:51   Ct Head Wo Contrast  12/17/2014    CLINICAL DATA:  Initial evaluation for acute loss of consciousness  EXAM: CT HEAD WITHOUT CONTRAST  CT CERVICAL SPINE WITHOUT CONTRAST  TECHNIQUE: Multidetector CT imaging of the head and cervical spine was performed following the standard protocol without intravenous contrast. Multiplanar CT image reconstructions of the cervical spine were also generated.  COMPARISON:  Prior study from 10/11/2014  FINDINGS: CT HEAD FINDINGS  Mild cerebral atrophy for patient age. Mild chronic small vessel ischemic changes present within the periventricular and deep white matter of both cerebral hemispheres.  No acute large vessel territory infarct identified. No intracranial hemorrhage. No mass lesion, mass effect, or midline shift. No extra-axial fluid collection. Mild prominence of the occipital horns of the lateral ventricles is stable relative to multiple prior exams. No hydrocephalus.  Several small lipomas noted within the posterior parietal scalp with underlying bony remodeling, stable from prior. Scalp soft tissues otherwise within normal limits.  Calvarium intact.  No acute abnormality about the orbits.  Moderate opacity present within the left frontoethmoidal recess. Mild scattered mucosal thickening present within the ethmoidal air cells. Mild  polypoid opacity present within the right sphenoid sinus. No mastoid effusion. Middle ear cavities are clear.  CT CERVICAL SPINE FINDINGS  The vertebral bodies are normally aligned with preservation of the normal cervical lordosis. Vertebral body heights are preserved. Normal C1-2 articulations are intact. No prevertebral soft tissue swelling. No acute fracture or listhesis.  Mild multilevel degenerative disc disease present, most 7 at C7-T1. No significant canal or foraminal stenosis appreciated.  Visualized soft tissues of the neck are within normal limits. Visualized lung apices are clear without evidence of apical pneumothorax.  IMPRESSION: CT BRAIN:  1. No acute intracranial  process. 2. Mild atrophy with chronic small vessel ischemic disease. 3. Mild paranasal sinus disease as above, most prominent within the left frontoethmoidal recess. 4. Scattered small soft tissue lipomas within the bilateral parietal scalp with underlying bony remodeling, stable.  CT CERVICAL SPINE:  No acute traumatic injury within the cervical spine.   Electronically Signed   By: Rise Mu M.D.   On: 12/17/2014 23:33   Ct Cervical Spine Wo Contrast  12/17/2014   CLINICAL DATA:  Initial evaluation for acute loss of consciousness  EXAM: CT HEAD WITHOUT CONTRAST  CT CERVICAL SPINE WITHOUT CONTRAST  TECHNIQUE: Multidetector CT imaging of the head and cervical spine was performed following the standard protocol without intravenous contrast. Multiplanar CT image reconstructions of the cervical spine were also generated.  COMPARISON:  Prior study from 10/11/2014  FINDINGS: CT HEAD FINDINGS  Mild cerebral atrophy for patient age. Mild chronic small vessel ischemic changes present within the periventricular and deep white matter of both cerebral hemispheres.  No acute large vessel territory infarct identified. No intracranial hemorrhage. No mass lesion, mass effect, or midline shift. No extra-axial fluid collection. Mild prominence of the occipital horns of the lateral ventricles is stable relative to multiple prior exams. No hydrocephalus.  Several small lipomas noted within the posterior parietal scalp with underlying bony remodeling, stable from prior. Scalp soft tissues otherwise within normal limits.  Calvarium intact.  No acute abnormality about the orbits.  Moderate opacity present within the left frontoethmoidal recess. Mild scattered mucosal thickening present within the ethmoidal air cells. Mild polypoid opacity present within the right sphenoid sinus. No mastoid effusion. Middle ear cavities are clear.  CT CERVICAL SPINE FINDINGS  The vertebral bodies are normally aligned with preservation of the  normal cervical lordosis. Vertebral body heights are preserved. Normal C1-2 articulations are intact. No prevertebral soft tissue swelling. No acute fracture or listhesis.  Mild multilevel degenerative disc disease present, most 7 at C7-T1. No significant canal or foraminal stenosis appreciated.  Visualized soft tissues of the neck are within normal limits. Visualized lung apices are clear without evidence of apical pneumothorax.  IMPRESSION: CT BRAIN:  1. No acute intracranial process. 2. Mild atrophy with chronic small vessel ischemic disease. 3. Mild paranasal sinus disease as above, most prominent within the left frontoethmoidal recess. 4. Scattered small soft tissue lipomas within the bilateral parietal scalp with underlying bony remodeling, stable.  CT CERVICAL SPINE:  No acute traumatic injury within the cervical spine.   Electronically Signed   By: Rise Mu M.D.   On: 12/17/2014 23:33   Mr Brain Wo Contrast (neuro Protocol)  12/18/2014   CLINICAL DATA:  Initial evaluation for loss of consciousness  EXAM: [name] HEAD WITHOUT CONTRAST  TECHNIQUE: Multiplanar, multiecho pulse sequences of the brain and surrounding structures were obtained without intravenous contrast.  COMPARISON:  Prior CT from 12/17/2014  FINDINGS: Mild diffuse prominence of  the CSF containing spaces is compatible with generalized cerebral atrophy. Patchy and confluent T2/FLAIR hyperintensity within the periventricular and deep white matter of both cerebral hemispheres is present, most consistent with chronic small vessel ischemic changes, mild to moderate for patient age.  No abnormal foci of restricted diffusion to suggest acute intracranial infarct. Gray-white matter differentiation maintained. Normal intravascular flow voids are preserved. No acute intracranial hemorrhage.  No mass lesion, mass effect, or midline shift. Ventriculomegaly is stable from previous. No extra-axial fluid collection.  Craniocervical junction within  normal limits. Pituitary gland normal. Globes and orbits demonstrate a normal appearance without acute abnormality. Polypoid opacity within the left frontoethmoidal recess, maxillary sinuses, and right sphenoid sinus, with scattered mucosal thickening within the remainder the paranasal sinuses. No mastoid effusion. Inner ear structures normal.  Bone marrow signal intensity within normal limits. 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  IMPRESSION: 1. No acute intracranial infarct or other process identified. 2. Mild cerebral atrophy with mild to moderate chronic microvascular ischemic disease.   Electronically Signed   By: Rise Mu M.D.   On: 12/18/2014 03:41   Dg Knee Complete 4 Views Right  12/17/2014   CLINICAL DATA:  Loss of consciousness.  Knee pain.  EXAM: RIGHT KNEE - COMPLETE 4+ VIEW  COMPARISON:  02/23/2014  FINDINGS: There is no evidence of fracture, dislocation, or joint effusion. There is no evidence of arthropathy or other focal bone abnormality. Soft tissues are unremarkable.  IMPRESSION: Negative.   Electronically Signed   By: Burman Nieves M.D.   On: 12/17/2014 22:53   Labs: Basic Metabolic Panel:  Recent Labs Lab 12/17/14 2325 12/19/14 0705  NA 137 137  K 3.6 3.7  CL 105 101  CO2 21* 27  GLUCOSE 238* 245*  BUN 8 8  CREATININE 0.66 0.71  CALCIUM 8.7* 8.4*   Liver Function Tests:  Recent Labs Lab 12/17/14 2325 12/19/14 0705  AST 19 20  ALT 22 26  ALKPHOS 81 75  BILITOT 0.4 0.6  PROT 6.0* 5.7*  ALBUMIN 3.6 3.2*    Recent Labs Lab 12/18/14 0015 12/19/14 0705  AMMONIA 46* 36*   CBC:  Recent Labs Lab 12/17/14 2325  WBC 6.4  NEUTROABS 3.4  HGB 14.2  HCT 39.8  MCV 89.6  PLT 119*   CBG:  Recent Labs Lab 12/18/14 1159 12/18/14 1630 12/18/14 2122 12/19/14 0637 12/19/14 1109  GLUCAP 195* 209* 255* 217* 272*    Signed:  Vassie Loll  [facility] 12/19/2014, 3:09 PM

## 2014-12-19 NOTE — ED Notes (Signed)
Per EMS: pt recent discharged from [facility] at 1600 today after new onset of seizures. Pt placed on keppra that is to be taken at 0900 and 2100, pt did take morning dose of keppra. Pt had tonic seizure movement x45 minutes ago, witness by physician while at friends doctors appt. EMS reports pt post ictal en route, responding to pain. nad noted.

## 2014-12-19 NOTE — Progress Notes (Signed)
Subjective: Essentially back to baseline per wife.   Exam: Filed Vitals:   12/19/14 0944  BP: 98/66  Pulse: 86  Temp: 98.1 F (36.7 C)  Resp: 20   Gen: In bed, NAD MS: awake,alert, interactive and appropriate Eyes dysconjugate   Impression: 44 yo M with recurrent syncope. The duration of his post-ictal confusion with this episode I think argues for seizure as etiology. depakote was low, but ammonia was high. He has been changed to keppra.   Recommendations: 1)keppra 500mg  BID 2) No further recommendations at thsi time. Please call with further questions.   Ritta Slot, MD [facility] [phone]  If 7pm- 7am, please page neurology on call as listed in AMION.

## 2014-12-19 NOTE — ED Notes (Signed)
Pt HR noted to be 120-130, this RN went in to assess pt, pt crying and tearful. Girlfriend at bedside and states that pt says he misses his grandmother (grandmother passed away 3 years ago.) pt tachypneic and crying uncontrollably. Pt coached to take slow deep breaths, cold washcloth applied to forehead and pt heart rate decreased to 84 bpm.

## 2014-12-19 NOTE — Progress Notes (Signed)
Pt for discharge home today. Discharge orders received. IV dcd. Discharge instructions and prescriptions (2) given with verbalized understanding. Family at bedside to assist with discharge. Nurse tech brought patient to lobby via wheelchair at 1620. Transported to home by family member.

## 2014-12-20 MED ORDER — LEVETIRACETAM 750 MG PO TABS: 750.0000 mg | ORAL_TABLET | Freq: Two times a day (BID) | ORAL | Status: DC

## 2014-12-20 NOTE — ED Provider Notes (Signed)
CSN: [id number]     Arrival date & time 12/19/14  2008 History   First MD Initiated Contact with Patient 12/19/14 2023     Chief Complaint  Patient presents with  . Seizures     (Consider location/radiation/quality/duration/timing/severity/associated sxs/prior Treatment) Patient is a 44 y.o. male presenting with seizures.  Seizures Seizure activity on arrival: no   Seizure type:  Grand mal Preceding symptoms comment:  Unknown, patient does not remember Initial focality:  None Episode characteristics: generalized shaking   Postictal symptoms: confusion and somnolence   Return to baseline: no   Duration:  1 minute Timing:  Once Progression:  Resolved Context comment:  Was discharged earlier today after being changed to Keppra from Depakote. PTA treatment:  None History of seizures: yes     Past Medical History  Diagnosis Date  . Diabetes mellitus without complication   . Neuropathy   . Asthma   . Vision loss of left eye   . Blindness     bilateral  . Hypercholesteremia   . Depression   . Anxiety   . Stroke 02/2014    right sided weakness/numbness   Past Surgical History  Procedure Laterality Date  . Hernia repair    . Eye surgery Left   . Abdominal surgery     Family History  Problem Relation Age of Onset  . High blood pressure Mother   . High blood pressure Sister   . High blood pressure Paternal Grandfather    History  Substance Use Topics  . Smoking status: Current Every Day Smoker -- 1.00 packs/day    Types: Cigarettes  . Smokeless tobacco: Never Used  . Alcohol Use: No     Comment: Ocassional drinker    Review of Systems  Unable to perform ROS Skin: Negative for rash.  Neurological: Positive for seizures.  All other systems reviewed and are negative.     Allergies  Penicillins and Toradol  Home Medications   Prior to Admission medications   Medication Sig Start Date End Date Taking? Authorizing Provider  albuterol (PROVENTIL HFA;VENTOLIN  HFA) 108 (90 BASE) MCG/ACT inhaler Inhale 2 puffs into the lungs every 6 (six) hours as needed for wheezing or shortness of breath.   Yes Historical Provider, MD  aspirin 325 MG tablet Take 1 tablet (325 mg total) by mouth daily. 05/21/14  Yes Joseph Art, DO  atorvastatin (LIPITOR) 40 MG tablet Take 40 mg by mouth at bedtime.    Yes Historical Provider, MD  FLUoxetine (PROZAC) 40 MG capsule Take 40 mg by mouth daily.   Yes Historical Provider, MD  ibuprofen (ADVIL,MOTRIN) 600 MG tablet Take 600 mg by mouth every 6 (six) hours as needed for moderate pain.   Yes Historical Provider, MD  metFORMIN (GLUCOPHAGE-XR) 500 MG 24 hr tablet Take 1,000 mg by mouth 2 (two) times daily.   Yes Historical Provider, MD  Multiple Vitamins-Minerals (MULTIVITAMIN WITH MINERALS) tablet Take 1 tablet by mouth daily.   Yes Historical Provider, MD  pregabalin (LYRICA) 150 MG capsule Take 1 capsule (150 mg total) by mouth 2 (two) times daily. 12/19/14  Yes Vassie Loll, MD  levETIRAcetam (KEPPRA) 750 MG tablet Take 1 tablet (750 mg total) by mouth 2 (two) times daily. 01/03/15 02/01/15  Drema Pry, MD   BP 100/61 mmHg  Pulse 72  Temp(Src) 98.3 F (36.8 C) (Oral)  Resp 12  Wt 192 lb (87.091 kg)  SpO2 96% Physical Exam  Constitutional: He is oriented to person, place, and  time. He appears well-nourished. No distress.  HENT:  Head: Normocephalic and atraumatic.  Right Ear: External ear normal.  Left Ear: External ear normal.  Eyes: Pupils are equal, round, and reactive to light. Right eye exhibits no discharge. Left eye exhibits no discharge. No scleral icterus.  Neck: Normal range of motion. Neck supple.  Cardiovascular: Normal rate.  Exam reveals no gallop and no friction rub.   No murmur heard. Pulmonary/Chest: Effort normal and breath sounds normal. No stridor. No respiratory distress. He has no wheezes. He has no rales. He exhibits no tenderness.  Abdominal: Soft. He exhibits no distension and no mass.  There is no tenderness. There is no rebound and no guarding.  Musculoskeletal: He exhibits no edema or tenderness.  Neurological: He is alert and oriented to person, place, and time. GCS eye subscore is 1. GCS verbal subscore is 2. GCS motor subscore is 5.  Patient is protecting face when arms are dropped above his head.   Skin: Skin is warm and dry. No rash noted. He is not diaphoretic. No erythema.    ED Course  Procedures (including critical care time) Labs Review Labs Reviewed  CBC - Abnormal; Notable for the following:    MCHC 36.5 (*)    Platelets 127 (*)    All other components within normal limits  COMPREHENSIVE METABOLIC PANEL - Abnormal; Notable for the following:    Glucose, Bld 234 (*)    All other components within normal limits  CBG MONITORING, ED - Abnormal; Notable for the following:    Glucose-Capillary 261 (*)    All other components within normal limits    Imaging Review Mr Brain Wo Contrast (neuro Protocol)  12/18/2014   CLINICAL DATA:  Initial evaluation for loss of consciousness  EXAM: [name] HEAD WITHOUT CONTRAST  TECHNIQUE: Multiplanar, multiecho pulse sequences of the brain and surrounding structures were obtained without intravenous contrast.  COMPARISON:  Prior CT from 12/17/2014  FINDINGS: Mild diffuse prominence of the CSF containing spaces is compatible with generalized cerebral atrophy. Patchy and confluent T2/FLAIR hyperintensity within the periventricular and deep white matter of both cerebral hemispheres is present, most consistent with chronic small vessel ischemic changes, mild to moderate for patient age.  No abnormal foci of restricted diffusion to suggest acute intracranial infarct. Gray-white matter differentiation maintained. Normal intravascular flow voids are preserved. No acute intracranial hemorrhage.  No mass lesion, mass effect, or midline shift. Ventriculomegaly is stable from previous. No extra-axial fluid collection.  Craniocervical junction  within normal limits. Pituitary gland normal. Globes and orbits demonstrate a normal appearance without acute abnormality. Polypoid opacity within the left frontoethmoidal recess, maxillary sinuses, and right sphenoid sinus, with scattered mucosal thickening within the remainder the paranasal sinuses. No mastoid effusion. Inner ear structures normal.  Bone marrow signal intensity within normal limits. 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  IMPRESSION: 1. No acute intracranial infarct or other process identified. 2. Mild cerebral atrophy with mild to moderate chronic microvascular ischemic disease.   Electronically Signed   By: Rise Mu M.D.   On: 12/18/2014 03:41     EKG Interpretation   Date/Time:  Thursday December 19 2014 20:20:45 EDT Ventricular Rate:  78 PR Interval:  121 QRS Duration: 82 QT Interval:  383 QTC Calculation: 436 R Axis:   86 Text Interpretation:  Sinus rhythm Borderline T abnormalities, inferior  leads No significant change since last tracing Confirmed by Mirian Mo [id number]) on 12/19/2014 10:20:11 PM      MDM  44 year old male with a history of diabetes, prior CVAs, hypertension, blindness, seizures who was discharged earlier today from medicine with neurology consultation where he obtained an [name] and EEG for continued seizures. Workup was unremarkable. Patient was changed from Depakote to Keppra. He presents again for another seizure shortly after being discharged. Seizure was witnessed by the patient's girlfriend who is not present. She did eventually come in and provide additional history. Her EMS report patient was seen having generalized shaking in a chair all at clinic pharmacy. No head trauma. On arrival patient was postictal and minimally responsive. ABCs were intact. Patient was protecting his face was in his arms were released above his head. Basic labs obtained and were nonspecific. She returned to baseline. Repeat exam was nonfocal other than  patient's baseline blindness. Neurology consult and recommended loading patient with 1 g of Keppra and changing his prescription to 750 mg twice a day.   Patient in good condition and stable for discharge with strict return precautions. Patient to follow up with neurology as scheduled.  Patient seen in conjunction with Dr. Littie Deeds.  Final diagnoses:  Seizure        Drema Pry, MD 12/20/14 1610  Mirian Mo, MD 12/21/14 [phone]

## 2014-12-20 NOTE — Discharge Instructions (Signed)

## 2014-12-30 ENCOUNTER — Other Ambulatory Visit: Payer: Self-pay

## 2015-01-02 ENCOUNTER — Encounter: Payer: Self-pay | Admitting: Neurology

## 2015-01-02 ENCOUNTER — Ambulatory Visit (INDEPENDENT_AMBULATORY_CARE_PROVIDER_SITE_OTHER): Payer: Medicaid Other | Admitting: Neurology

## 2015-01-02 VITALS — BP 105/69 | HR 76 | Temp 97.6°F | Ht 68.0 in | Wt 201.6 lb

## 2015-01-02 DIAGNOSIS — R569 Unspecified convulsions: Secondary | ICD-10-CM | POA: Diagnosis not present

## 2015-01-02 NOTE — Patient Instructions (Signed)
Remember to drink plenty of fluid, eat healthy meals and do not skip any meals. Try to eat protein with a every meal and eat a healthy snack such as fruit or nuts in between meals. Try to keep a regular sleep-wake schedule and try to exercise daily, particularly in the form of walking, 20-30 minutes a day, if you can.   As far as your medications are concerned, I would like to suggest: Continue current medications  Our phone number is [phone]. We also have an after hours call service for urgent matters and there is a physician on-call for urgent questions. For any emergencies you know to call 911 or go to the nearest emergency room   

## 2015-01-02 NOTE — Progress Notes (Signed)
[facility]   Provider: Dr Lucia GaskinsAhern Referring Provider: Sherilyn Bankerheren, Isa, MD Primary Care Physician: Sherilyn BankerHEREN, ISA, MD  CC: Repeated Right-sided weakness with altered awareness  Interval History: 2 weeks ago he was going out to the car and wife saw him laying in the yard. They called 911. He was brought to the emergency room. He was confused for 24 hours in the emergency room. He thought his sister was his ex-wife. Didn't know his mother's name. Didn't know the date. The kept him a few nights. He was changed from Depakote to Keppra. He went to the restroom and she heard a knock and she saw him against the wall. He turned face first into the restroom door. He couldn't respond or help. He was seen at the pcp office and his body was actively shaking. He was sent to the ED and was observed. He was confused afterwards for 4 hours. He wanted to sleep. His pcp increased his Keppra to 750 bid. Another EEG was performed on the 15th during his post-ictal period which was normal. Recommend long-term EEG to Dr. Roda ShuttersXu.   11/24/2013: Miguel DowningGeorge L Roosvelt MaserWiseman Jr. is a 44 y.o. male here as a referral from Dr. Cleda Clarksheren for acute onset right-sided weakness. He follows with Dr. Roda ShuttersXu in the office however he is seeing me today as Dr. Roda ShuttersXu is not here and patient had another episode of right-sided weakness with altered awareness. PMH of DM, DM neuropathy, HLD, blindness was admitted or or had ER visits 7 times since 01/2014 for episodic LOC, unresponsive to verbal commands, confusion, nonverbal, right sided weakness/heavy feeling, headache, chest pain. Etiology has been unclear. 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 The possibility of complicated migraines has also been question and patient was placed on Topamax. However most recently patient had another episode and this time she was laying on  the couch.   In April, patient was laying on the couch and he was not responding. He was just staring off. His girl friend is with him and provides most information. Eyes wide open. Looking straight in the direction of the TV, no foaming at the mouth, no abnormal movements. The paramedics came, staring spell lasted until the paramedics came, he would occ grunt. He closed his eyes in 5 minutes but no response except a grunt when someone would say his name. He told the paramedics that his right side was numb, no pain on the right side to noxious stimuli by the paramedics. They took him to the ED and by the time he got to the ED, there was slurred speech and facial droop. There was no headache associated with this episode. He is on topamax 50mg  bid for complicated migraines. He was confused afterwards but he ok when got to the hospital. Also c/o memory loss. In [place] there was reportedly an episode of GTCS after sitting on the couch with confusion afterwards.  Reviewed notes, labs and imaging from outside physicians, which showed:  EEG normal [name] of the brain: Personally reviewed images: Mild ventriculomegaly likely due to global atrophy No mass lesions or mass effect, no acute ischemia or acute events. Nonspecific chronic white matter changes likely small vessel ischemic disease which is advanced for age   Review of Systems: Patient complains of symptoms per HPI as well as the following symptoms: Dizziness, headache, weakness, joint pain, walking difficulty, daytime sleepiness, snoring. Pertinent negatives per HPI.  All others negative.    History   Social History  . Marital Status: Significant Other    Spouse Name: N/A  . Number of Children: 2  . Years of Education: 9th   Occupational History  . disabled    Social History Main Topics  . Smoking status: Current Every Day Smoker -- 1.00 packs/day    Types: Cigarettes  . Smokeless tobacco: Never Used  . Alcohol Use: No     Comment:  Ocassional drinker  . Drug Use: No  . Sexual Activity: Yes   Other Topics Concern  . Not on file   Social History Narrative   ** Merged History Encounter **   Patient  Lives with his girl friend   Patient is right handed   Patient coffee daily        Family History  Problem Relation Age of Onset  . High blood pressure Mother   . High blood pressure Sister   . High blood pressure Paternal Grandfather     Past Medical History  Diagnosis Date  . Diabetes mellitus without complication   . Neuropathy   . Asthma   . Vision loss of left eye   . Blindness     bilateral  . Hypercholesteremia   . Depression   . Anxiety   . Stroke 02/2014    right sided weakness/numbness    Past Surgical History  Procedure Laterality Date  . Hernia repair    . Eye surgery Left   . Abdominal surgery      Current Outpatient Prescriptions  Medication Sig Dispense Refill  . albuterol (PROVENTIL HFA;VENTOLIN HFA) 108 (90 BASE) MCG/ACT inhaler Inhale 2 puffs into the lungs every 6 (six) hours as needed for wheezing or shortness of breath.    Marland Kitchen aspirin 325 MG tablet Take 1 tablet (325 mg total) by mouth daily. 30 tablet 0  . atorvastatin (LIPITOR) 40 MG tablet Take 40 mg by mouth at bedtime.     Marland Kitchen FLUoxetine (PROZAC) 40 MG capsule Take 40 mg by mouth daily.    Marland Kitchen ibuprofen (ADVIL,MOTRIN) 600 MG tablet Take 600 mg by mouth every 6 (six) hours as needed for moderate pain.    Melene Muller ON 01/03/2015] levETIRAcetam (KEPPRA) 750 MG tablet Take 1 tablet (750 mg total) by mouth 2 (two) times daily. 60 tablet 0  . metFORMIN (GLUCOPHAGE-XR) 500 MG 24 hr tablet Take 1,000 mg by mouth 2 (two) times daily.    . Multiple Vitamins-Minerals (MULTIVITAMIN WITH MINERALS) tablet Take 1 tablet by mouth daily.    . pregabalin (LYRICA) 150 MG capsule Take 1 capsule (150 mg total) by mouth 2 (two) times daily. 60 capsule 1   No current facility-administered medications for this visit.    Allergies as of 01/02/2015 -  Review Complete 01/02/2015  Allergen Reaction Noted  . Penicillins Other (See Comments) 01/25/2014  . Toradol [ketorolac tromethamine] Hives 01/25/2014    Vitals: BP 105/69 mmHg  Pulse 76  Temp(Src) 97.6 F (36.4 C) (Oral)  Ht  (1.727 m)  Wt 201 lb 9.6 oz (91.445 kg)  BMI 30.66 kg/m2 Last Weight:  Wt Readings from Last 1 Encounters:  01/02/15 201 lb 9.6 oz (91.445 kg)   Last Height:   Ht Readings from Last 1 Encounters:  01/02/15  (1.727 m)     Exam: Gen: NAD, conversant    Neuro: Focused Neurologic Exam  Speech:  Speech is normal; fluent and spontaneous with normal comprehension.  Cognition:  The patient is oriented to person, place, and time;   recent and remote memory intact;   language fluent;   normal attention, concentration,   fund of knowledge Cranial Nerves:  The pupils are equal, round, and reactive to light. Patient has no vision in the left eye cannot see light or shadows, right eye he can see finger counting with severely limited peripheral vision. Extraocular movements are intact. Trigeminal sensation is intact and the muscles of mastication are normal. The face is symmetric. The palate elevates in the midline. Hearing intact. Voice is normal. Shoulder shrug is normal. The tongue has normal motion without fasciculations.    Gait:  Not ataxic  Motor Observation:  No asymmetry, no atrophy, and no involuntary movements noted. Tone:  Normal muscle tone.   Posture:  Posture is normal. normal erect   Strength:  Strength is normal in the upper and lower limbs.      Assessment/Plan: PMH of DM, DM neuropathy, HLD, blindness was admitted for had multiple ER visitsand recent admission for episodic LOC, unresponsive to verbal commands, confusion, nonverbal, right sided weakness/heavy feeling, headache, chest pain. Etiology has been unclear. 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 The possibility of complicated migraines has also been question and patient was placed on Topamax. However most recently patient had another episode and this time she was laying on the couch. He was recently admitted again for LOC, wife found him in the yard (see HPI). Concerning for seizure activity but EEG normal during post-ictal period.   Continue ASA and Lipitor for stroke prevention  Needs to follow closely with primary care for management of vascular risk factors.  Continue Keppra for seizure  Follow-up with Dr. Roda Shutters, recommend long-term EEG monitoring as clinically warranted.   Naomie Dean, MD  [facility] [address] [place], [place] [address]  Phone [phone] Fax [phone]  A total of 30 minutes was spent face-to-face with this patient. Over half this time was spent on counseling patient on the seizure versus migraine diagnosis and different diagnostic and therapeutic options available.

## 2015-01-15 ENCOUNTER — Ambulatory Visit (INDEPENDENT_AMBULATORY_CARE_PROVIDER_SITE_OTHER): Payer: Medicaid Other | Admitting: Neurology

## 2015-01-15 ENCOUNTER — Encounter: Payer: Self-pay | Admitting: Neurology

## 2015-01-15 VITALS — BP 94/59 | HR 87 | Ht 68.0 in | Wt 197.4 lb

## 2015-01-15 DIAGNOSIS — E785 Hyperlipidemia, unspecified: Secondary | ICD-10-CM

## 2015-01-15 DIAGNOSIS — R4020 Unspecified coma: Secondary | ICD-10-CM

## 2015-01-15 DIAGNOSIS — I10 Essential (primary) hypertension: Secondary | ICD-10-CM | POA: Diagnosis not present

## 2015-01-15 DIAGNOSIS — E1143 Type 2 diabetes mellitus with diabetic autonomic (poly)neuropathy: Secondary | ICD-10-CM | POA: Diagnosis not present

## 2015-01-15 DIAGNOSIS — R404 Transient alteration of awareness: Secondary | ICD-10-CM

## 2015-01-15 DIAGNOSIS — I951 Orthostatic hypotension: Secondary | ICD-10-CM | POA: Diagnosis not present

## 2015-01-15 NOTE — Patient Instructions (Addendum)
-   continue keppra  bid for seizure control - continue ASA and lipitor for stroke prevention - Follow up with your primary care physician for stroke risk factor modification. Recommend maintain blood pressure goal <130/80, diabetes with hemoglobin A1c goal below 6.5% and lipids with LDL cholesterol goal below 70 mg/dL.  - monitor blood pressure and record. Once has symptoms, check BP right away.  - check glucose at home. - follow up in 2 months

## 2015-01-15 NOTE — Progress Notes (Signed)
STROKE NEUROLOGY FOLLOW UP NOTE  NAME: Miguel Diaz. DOB: 1971/03/21  REASON FOR VISIT: stroke follow up HISTORY FROM: wife and chart   Today we had the pleasure of seeing Miguel Diaz. in follow-up at our [facility]rology [facility]. Pt was accompanied by wife.   History Summary Miguel Diaz is a 44 y.o. male with a history of DM, DM neuropathy, HLD, blindness was first admitted in 01/2014 for syncope episode after voiding. He could not remember what happened exactly. Initial workup found a normal CBC, BMP, A1c 8.6, troponin was negative. A CT of c-spine showed no fracture, and CT head showed a 3mm punctate lesion in right posterior fossa concerning for subarachnoid hemorrhage or cerebellar hemorrhage. [name] brain showed no bleed, and the punctate lesion was smaller but present on CT head done in 2013, felt this was consistent with choroid plexus calcification. He had normal orthostatic vital signs and was monitored on tele overnight with no events, repeat EKG continued to show TWI in III and aVF.Carotid dopplers, echocardiogram, and myoview non-concerning per cardiology.    He was again admitted on 02/11/14 for acute onset right-sided numbness and weakness. Wife states that he then became nonverbal and seemed confused but no LOC for a brief period of time, then began improving in transit with EMS. On arrival to ER, he still had some right-sided weakness, but this seemed to improve but he continued to have right-sided numbness. [name] showed questionable punctate acute infarct in the left dorsal brainstem at the junction of pons and midbrain. However, this did not correspond to pt symptoms. LDL 78 and A1C 8.6. He was enrolled in SOCRATES trial and on either ASA or brilinta for stroke prevention. However, he sated that he did have headache after the initial event.  03/27/14 follow up - the patient has been doing stable. He stated that his symptoms of right side weakness and numbness are gone, only  has some bilateral toe numbness which he has before due to DM neuropathy. He stated that his sugar is better, this am 127, more strict diet at home.  He continued to have vision difficulties. He stated that her in 2013, he had left eye vision loss, sudden onset, was told to be due to diabetes or maybe congenital causes. In 01/2014, he passed out and admitted to [facility] for evaluation. He was told to have post-micturition syncope and was discharged after improvement. But wife said since then he had right eye tunnel vision. Has been followed up with eye doctor and etiology not clear.   He was admitted on 04/08/14 due to syncope. He mentions that he was trying to let his dog in the house and the next thing that he remembers was he was surrounded by EMS. He called his wife to get some help as he had fallen down although patient does not remember that. When the wife reached home within a few minutes she found that he was lying down on the floor unresponsive to verbal command. There was no incontinence or seizure-like activity is noted. There was no tongue bite noted. Patient become more alert with arrival of the EMS. EEG was normal.    He was admitted again in 05/2014 with acute onset nonverbal, not feeling right, confusion and right sided numbness/weakness associated with HA. CT showed no acute abnormality and [name] showed no acute stroke. EEG normal, no seizure. He was discharge with ASA (has finished SOCRATES trial).   On 07/05/14, pt was sent to ER  by EMS due to acute onset headache, confusion and right sided heaviness feeling. CT head negative. He was found to have elevated blood glucose at 400s. He was discharged with outpt follow up.   07/31/14 follow up He stated that he has migraine since he was young and for almost 40 years. Usually once a month, lasting 1-2 days. HA happens at the back of head, or bilateral temporal or at the top of head, 8-9/10, does not take medication for HA except sometimes tylenol. His  father had hx of migraine but found out to have aneurysm. He denies migraine in other family members.  He still has vision difficulty and recent visit of ophthalmology and was told no change of visual acuity. 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   10/07/14 follow up - he had ER visit on 09/22/14. He bend over to reach something on the floor and then abruptly stood up. He then had severe right eye pain followed by feeling very lightheaded, had a syncopal episode while sitting on the couch followed by left-sided tonic-clonic activity of the left arm and left leg during which time he was unresponsive. This lasted approximately 1-2 minutes, resolved spontaneously, he was then gradually and progressively more awake and alert. When paramedics arrived the patient was unable to ambulate, he was assisted to the stretcher and transported for evaluation. [name] in ED showed no acute infarct, he was discharged after back to baseline. His SBP in ER was between 104-127. Not sure the BP at the time of onset. He had again ER visit on 09/27/14. He was outside crushing cans with his feet, started to feel chest pain, sat down, felt sharp radiating pain to left arm, but again developed some right arm and leg weakness. He states that he occasionally will have this flareup whenever he has something else going on. [name] brain negative for acute stroke. Troponin and D-dimer negative. He was discharged in good condition.   He had ER visit on 10/10/14, wife stated that he was lying in the couch, not responding to questions, eyes open, only respond with groan sound, EMS was called, wife did not remember blood pressure at that time. On EMS arrival, patient feels better, however complains right-sided numbness, once standing up with EMS, he seemed limp on the right side. In the ER, [name] was done, no acute intracranial process. He was discharged from ER in follow-up in clinic.  11/25/14 follow up (Dr. Lucia GaskinsAhern) - Topamax was  discontinued as patient said it is not effective, he was put on Depakote for possible common dictating migraine versus seizure. Repeat EEG was normal. Continued on aspirin and Lipitor.  He was admitted on 12/17/14 as he was going out to the car and wife saw him laying in the yard. They called 911. He was brought to the emergency room. He was confused for 24 hours in the emergency room. He thought his sister was his ex-wife. Didn't know his mother's name. Didn't know the date. He was hospitalized for 2 nights. He was changed from Depakote to Keppra. EEG performed during his post ictal period was normal. CT and [name] again showed no acute intracranial process. He was discharged for Keppra 500 twice a day.  On the day of discharge 12/19/14, he had ER visit again - He went to the restroom in the afternoon and wife heard a knock and she saw him against the wall. He turned face first into the restroom door. He couldn't respond or  help. His body was actively shaking at right side. He was sent to the ED and was observed. He was confused afterwards for 4 hours. He wanted to sleep. His Keppra was increased to 750 bid.  01/02/15 follow up (Dr. Lucia Gaskins) - pt has been doing OK without LOC episodes. Keppra was continued at 750 twice a day, and pseudoseizure was entertained.   Interval History During the interval time, pt was doing OK, no recurrent episodes yet. His BP was low today in clinic 94/59. Wife showed me his BP record with orthostatic vitals, there are trend of orthostatic hypotension in 2 out of 20 occasions. Has not been checking glucose lately as he is running out of testing stripes. Patient stated that, he find out his father daughter and sister as well as his son all have seizure history, and his daughter also has migraine history. He is currently on Keppra 750 twice a day and Lyrica, aspirin and Lipitor.  REVIEW OF SYSTEMS: Full 14 system review of systems performed and notable only for those listed below and  in HPI above, all others are negative:  Constitutional: N/A  Cardiovascular:   Ear/Nose/Throat: N/A Skin: N/A  Eyes:   Respiratory: SOB, asthma  Gastroitestinal: N/A  Genitourinary: Frequent urination Hematology/Lymphatic: N/A  Endocrine: N/A  Musculoskeletal: N/A  Allergy/Immunology: N/A Neurological: Numbness, weakness, seizure Psychiatric: Frequent waking, daytime sleepiness  The following represents the patient's updated allergies and side effects list: Allergies  Allergen Reactions  . Penicillins Other (See Comments)    Unknown childhood reaction  . Toradol [Ketorolac Tromethamine] Hives    Labs since last visit of relevance include the following: Results for orders placed or performed during the hospital encounter of 12/19/14  CBC (if new onset seizures)  Result Value Ref Range   WBC 8.2 4.0 - 10.5 K/uL   RBC 4.85 4.22 - 5.81 MIL/uL   Hemoglobin 16.0 13.0 - 17.0 g/dL   HCT 13.2 44.0 - 10.2 %   MCV 90.3 78.0 - 100.0 fL   MCH 33.0 26.0 - 34.0 pg   MCHC 36.5 (H) 30.0 - 36.0 g/dL   RDW 72.5 36.6 - 44.0 %   Platelets 127 (L) 150 - 400 K/uL  Comprehensive metabolic panel  Result Value Ref Range   Sodium 138 135 - 145 mmol/L   Potassium 4.2 3.5 - 5.1 mmol/L   Chloride 105 101 - 111 mmol/L   CO2 22 22 - 32 mmol/L   Glucose, Bld 234 (H) 65 - 99 mg/dL   BUN 10 6 - 20 mg/dL   Creatinine, Ser 3.47 0.61 - 1.24 mg/dL   Calcium 9.2 8.9 - 42.5 mg/dL   Total Protein 6.6 6.5 - 8.1 g/dL   Albumin 3.7 3.5 - 5.0 g/dL   AST 23 15 - 41 U/L   ALT 28 17 - 63 U/L   Alkaline Phosphatase 82 38 - 126 U/L   Total Bilirubin 0.5 0.3 - 1.2 mg/dL   GFR calc non Af Amer >60 >60 mL/min   GFR calc Af Amer >60 >60 mL/min   Anion gap 11 5 - 15  CBG monitoring, ED  Result Value Ref Range   Glucose-Capillary 261 (H) 65 - 99 mg/dL    The neurologically relevant items on the patient's problem list and lesion he and currently he equal and were reviewed on today's visit.  Neurologic  Examination  A problem focused neurological exam (12 or more points of the single system neurologic examination, vital signs counts as 1  point, cranial nerves count for 8 points) was performed.  Blood pressure 94/59, pulse 87, height 5\' 8"  (1.727 m), weight 197 lb 6.4 oz (89.54 kg).  General - Well nourished, well developed, in no apparent distress.  Ophthalmologic - not able to see through  Cardiovascular - Regular rate and rhythm with no murmur.  Mental Status -  Level of arousal and orientation to time, place, and person were intact. Language including expression, naming, repetition, comprehension was assessed and found intact.  Cranial Nerves II - XII - II - left eye vision loss, not able to see light or hand waving. Right eye central vision can see Finger counting. III, IV, VI - Extraocular movements intact, pupillary reflex bilateral intact. V - Facial sensation intact bilaterally. VII - Facial movement intact bilaterally. VIII - Hearing & vestibular intact bilaterally. X - Palate elevates symmetrically. XI - Chin turning & shoulder shrug intact bilaterally. XII - Tongue protrusion intact.  Motor Strength - The patient's strength was normal in all extremities and pronator drift was absent.  Bulk was normal and fasciculations were absent.   Motor Tone - Muscle tone was assessed at the neck and appendages and was normal.  Reflexes - The patient's reflexes were normal in all extremities and he had no pathological reflexes.  Sensory - Light touch, temperature/pinprick were assessed and were normal.    Coordination - The patient had normal movements in the hands and feet with no ataxia or dysmetria.  Tremor was absent.  Gait and Station - slow and they are in with cane due to vision deficit, but no hemiparetic gait.  Data reviewed: I personally reviewed the images and agree with the radiology interpretations.  Ct Head Wo Contrast: 01/25/2014 1. Punctate 3 mm focus of increased  is seen in the right posterior fossa. This could be a tiny subarachnoid hemorrhage or right cerebellar hemorrhage. 2. Right frontal sinusitis. Multiple mucous retention cyst noted throughout the paranasal sinuses. 3. No evidence of cervical spine fracture or dislocation.   Ct Cervical Spine Wo Contrast 01/25/2014: 1. Punctate 3 mm focus of increased is seen in the right posterior fossa. This could be a tiny subarachnoid hemorrhage or right cerebellar hemorrhage. 2. Right frontal sinusitis. Multiple mucous retention cyst noted throughout the paranasal sinuses. 3. No evidence of cervical spine fracture or dislocation.   Echocardiogram 01/26/14: Upper normal wall thickness with LVEF 60-65%, grade 2 diastolic dysfunction. MAC with trivial mitral regurgitation. PASP 12 mmHg.   Carotid dopplers: BIlateral: mild soft plaque CCA and origin ICA. 1-39% ICA stenosis. Vertebral artery flow is antegrade.  MYOCARDIAL IMAGING WITH SPECT (REST AND PHARMACOLOGIC-STRESS): No definite inducible ischemia with pharmacologic stress. 67% ejection fraction  Ct Angio Head and neck W/cm &/or Wo Cm  02/11/2014  IMPRESSION: No carotid bifurcation stenosis. Smooth atherosclerotic thickening of the vascular walls. Carotid siphon atherosclerosis bilaterally, 30% stenosis on the right and 50% stenosis on the left. No evidence of large or medium vessel embolic occlusion.   Ct Head Wo Contrast  02/11/2014  No acute finding on today's study. Chronic small vessel ischemic changes of the cerebral hemispheric white matter. Punctate density in the right cerebellum seen on 07/24 is no longer visible and presumably represented a microhemorrhage which was acute at that time. ( I think it is not the same cut and the 8/10 one still has a little cut of the hyperdensity view.)  07/05/14 1. No acute intracranial pathology seen on CT. 2. Stable prominence of the occipital horns of the  lateral ventricles is thought to reflect a normal variant. 3.  Mild small vessel ischemic microangiopathy. 4. Mucus retention cyst or polyp at the left maxillary sinus. 5. Bilateral soft tissue lipomas noted overlying the parietal calvarium, with slight bony remodeling noted on the left. Lipomas measure 1.8 cm on the right and 0.9 cm on the left.  12/17/14 -  CT BRAIN: 1. No acute intracranial process. 2. Mild atrophy with chronic small vessel ischemic disease. 3. Mild paranasal sinus disease as above, most prominent within the left frontoethmoidal recess. 4. Scattered small soft tissue lipomas within the bilateral parietal scalp with underlying bony remodeling, stable. CT CERVICAL SPINE: No acute traumatic injury within the cervical spine.   [name] Brain  12/18/14 - 1. No acute intracranial infarct or other process identified. 2. Mild cerebral atrophy with mild to moderate chronic microvascular ischemic disease.  10/11/14 - No acute intracranial process, specifically no acute ischemia. Mild parenchymal brain volume loss for age. Mild to moderate white matter changes suggest chronic small vessel ischemic disease, advanced for age.  09/27/14 - No acute intracranial abnormality. Stable noncontrast [name] appearance of the brain including nonspecific white matter signal changes.  09/22/14 - 1. No acute intracranial findings. 2. Moderate chronic small vessel disease.  05/20/14 1. No acute intracranial abnormality identified. 2. Moderate chronic small vessel ischemic disease. 3. Motion degraded MRA without evidence of major intracranial arterial occlusion or significant proximal stenosis. Left MCA branch vessels not well evaluated.  04/08/14 - Chronic microvascular ischemic change in the white matter stable. Negative for acute infarct.  02/11/2014 IMPRESSION: Suggestion of a 2 mm acute infarction at the junction of the pons and midbrain dorsally on the left. This could be in the region of the trochlear nucleus, MLF or superior cerebellar peduncle. Normal  intracranial MR angiography of the large and medium size vessels.   01/25/2014 1. No evidence of acute intracranial abnormality. The punctate hyperattenuating focus near the right foramen of Luschka on recent CT also appears to have been present on the 2013 [name], although less conspicuous on the older examination. Therefore, this most likely represents calcification, such as in choroid plexus, rather than acute hemorrhage. 2. Mild-to-moderate chronic small vessel ischemic disease.    Component     Latest Ref Rng 01/27/2014 02/12/2014  Cholesterol     0 - 200 mg/dL [phone]  Triglycerides     <150 mg/dL 213 (H) 086 (H)  HDL     >39 mg/dL 34 (L) 33 (L)  Total CHOL/HDL Ratio      4.5 4.5  VLDL     0 - 40 mg/dL 43 (H) 36  LDL (calc)     0 - 99 mg/dL 75 78  Hemoglobin V7Q     <5.7 %  8.6 (H)  Mean Plasma Glucose     <117 mg/dL  469 (H)  TSH     6.295 - 4.500 uIU/mL 1.620     Assessment: As you may recall, he is a 44 y.o. Caucasian male with PMH of DM, DM neuropathy, HLD, blindness was admitted or ER visit on 10 times since 01/2014 for episodic LOC, unresponsive to verbal commands, confusion, nonverbal, right sided weakness/heavy feeling, headache, chest pain. Etiology not that clear. All the brain images are negative so far. He does have sometimes trend for orthostatic hypotension, concerning for autonomic neuropathy due to diabetes, however, the episode can happen while he is lying in couch. Concerning for seizure, however, all EEG were normal and his EEG  was normal even during "post-ictal" state. Concerning for complicated migraine but he never had HA during episode and topamax and depakote were not effective. Finally, pseudoseizure is not being considered and is certainly a possibility. Currently he is on keppra  bid.     Plan:  - Continue ASA and lipitor for stroke prevention  - continue keppra  bid for seizure control. - Follow up with your primary care physician for stroke  risk factor modification. Recommend maintain blood pressure goal <130/80, diabetes with hemoglobin A1c goal below 6.5% and lipids with LDL cholesterol goal below 70 mg/dL.  - continue to check BP at home and record. Once has symptoms, check BP right away.  - check glucose at home. - RTC in 2 months   I spent more than 25 minutes of face to face time with the patient. Greater than 50% of time was spent in counseling and coordination of care. I have spend long time to collect relevant history and discussed with pt and wife about differential diagnosis of his episodes.   No orders of the defined types were placed in this encounter.    Meds ordered this encounter  Medications  . glipiZIDE (GLUCOTROL) 5 MG tablet    Sig: Take 5 mg by mouth daily before breakfast.    Patient Instructions  - continue keppra  bid for seizure control - continue ASA and lipitor for stroke prevention - Follow up with your primary care physician for stroke risk factor modification. Recommend maintain blood pressure goal <130/80, diabetes with hemoglobin A1c goal below 6.5% and lipids with LDL cholesterol goal below 70 mg/dL.  - monitor blood pressure and record. Once has symptoms, check BP right away.  - check glucose at home. - follow up in 2 months     Marvel Plan, MD PhD [facility] [address] [place], [place] [address] [phone]

## 2015-02-04 ENCOUNTER — Encounter (HOSPITAL_COMMUNITY): Payer: Self-pay | Admitting: Emergency Medicine

## 2015-02-04 ENCOUNTER — Emergency Department (HOSPITAL_COMMUNITY)
Admission: EM | Admit: 2015-02-04 | Discharge: 2015-02-04 | Disposition: A | Discharge status: Home / Self Care | Payer: Medicaid Other | Attending: Emergency Medicine | Admitting: Emergency Medicine

## 2015-02-04 ENCOUNTER — Emergency Department (HOSPITAL_COMMUNITY): Payer: Medicaid Other

## 2015-02-04 DIAGNOSIS — E119 Type 2 diabetes mellitus without complications: Secondary | ICD-10-CM | POA: Diagnosis not present

## 2015-02-04 DIAGNOSIS — Z8673 Personal history of transient ischemic attack (TIA), and cerebral infarction without residual deficits: Secondary | ICD-10-CM | POA: Diagnosis not present

## 2015-02-04 DIAGNOSIS — F419 Anxiety disorder, unspecified: Secondary | ICD-10-CM | POA: Diagnosis not present

## 2015-02-04 DIAGNOSIS — E782 Mixed hyperlipidemia: Secondary | ICD-10-CM | POA: Insufficient documentation

## 2015-02-04 DIAGNOSIS — H540 Blindness, both eyes: Secondary | ICD-10-CM | POA: Diagnosis not present

## 2015-02-04 DIAGNOSIS — S0990XA Unspecified injury of head, initial encounter: Secondary | ICD-10-CM | POA: Diagnosis present

## 2015-02-04 DIAGNOSIS — F329 Major depressive disorder, single episode, unspecified: Secondary | ICD-10-CM | POA: Insufficient documentation

## 2015-02-04 DIAGNOSIS — J45909 Unspecified asthma, uncomplicated: Secondary | ICD-10-CM | POA: Diagnosis not present

## 2015-02-04 DIAGNOSIS — Y998 Other external cause status: Secondary | ICD-10-CM | POA: Diagnosis not present

## 2015-02-04 DIAGNOSIS — Z720 Tobacco use: Secondary | ICD-10-CM | POA: Diagnosis not present

## 2015-02-04 DIAGNOSIS — W06XXXA Fall from bed, initial encounter: Secondary | ICD-10-CM | POA: Diagnosis not present

## 2015-02-04 DIAGNOSIS — R569 Unspecified convulsions: Secondary | ICD-10-CM | POA: Diagnosis not present

## 2015-02-04 DIAGNOSIS — Z7982 Long term (current) use of aspirin: Secondary | ICD-10-CM | POA: Insufficient documentation

## 2015-02-04 DIAGNOSIS — Y9289 Other specified places as the place of occurrence of the external cause: Secondary | ICD-10-CM | POA: Insufficient documentation

## 2015-02-04 DIAGNOSIS — W19XXXA Unspecified fall, initial encounter: Secondary | ICD-10-CM

## 2015-02-04 DIAGNOSIS — Z79899 Other long term (current) drug therapy: Secondary | ICD-10-CM | POA: Insufficient documentation

## 2015-02-04 DIAGNOSIS — Y9389 Activity, other specified: Secondary | ICD-10-CM | POA: Insufficient documentation

## 2015-02-04 DIAGNOSIS — Z880 Allergy status to penicillin: Secondary | ICD-10-CM | POA: Insufficient documentation

## 2015-02-04 LAB — CBC WITH DIFFERENTIAL/PLATELET
BASOS PCT: 0 % (ref 0–1)
Basophils Absolute: 0 10*3/uL (ref 0.0–0.1)
Eosinophils Absolute: 0.4 10*3/uL (ref 0.0–0.7)
Eosinophils Relative: 5 % (ref 0–5)
HEMATOCRIT: 43.4 % (ref 39.0–52.0)
Hemoglobin: 15 g/dL (ref 13.0–17.0)
Lymphocytes Relative: 26 % (ref 12–46)
Lymphs Abs: 1.9 10*3/uL (ref 0.7–4.0)
MCH: 32 pg (ref 26.0–34.0)
MCHC: 34.6 g/dL (ref 30.0–36.0)
MCV: 92.5 fL (ref 78.0–100.0)
MONO ABS: 0.4 10*3/uL (ref 0.1–1.0)
MONOS PCT: 6 % (ref 3–12)
Neutro Abs: 4.5 10*3/uL (ref 1.7–7.7)
Neutrophils Relative %: 62 % (ref 43–77)
Platelets: 108 10*3/uL — ABNORMAL LOW (ref 150–400)
RBC: 4.69 MIL/uL (ref 4.22–5.81)
RDW: 12.2 % (ref 11.5–15.5)
WBC: 7.2 10*3/uL (ref 4.0–10.5)

## 2015-02-04 LAB — BASIC METABOLIC PANEL
Anion gap: 9 (ref 5–15)
BUN: 13 mg/dL (ref 6–20)
CO2: 24 mmol/L (ref 22–32)
Calcium: 8.2 mg/dL — ABNORMAL LOW (ref 8.9–10.3)
Chloride: 105 mmol/L (ref 101–111)
Creatinine, Ser: 0.59 mg/dL — ABNORMAL LOW (ref 0.61–1.24)
GFR calc Af Amer: >60 mL/min (ref >60)
GLUCOSE: 204 mg/dL — AB (ref 65–99)
Potassium: 3.6 mmol/L (ref 3.5–5.1)
Sodium: 138 mmol/L (ref 135–145)

## 2015-02-04 LAB — PHOSPHORUS: Phosphorus: 3.1 mg/dL (ref 2.5–4.6)

## 2015-02-04 LAB — MAGNESIUM: MAGNESIUM: 1.9 mg/dL (ref 1.7–2.4)

## 2015-02-04 LAB — CBG MONITORING, ED: GLUCOSE-CAPILLARY: 194 mg/dL — AB (ref 65–99)

## 2015-02-04 MED ORDER — ACETAMINOPHEN 325 MG PO TABS
650.0000 mg | ORAL_TABLET | Freq: Once | ORAL | Status: AC
  Administered 2015-02-04: 650 mg via ORAL
  Filled 2015-02-04: qty 2

## 2015-02-04 MED ORDER — LEVETIRACETAM 1000 MG PO TABS: 1000.0000 mg | ORAL_TABLET | Freq: Two times a day (BID) | ORAL | Status: DC

## 2015-02-04 MED ORDER — LEVETIRACETAM 750 MG PO TABS: 750.0000 mg | ORAL_TABLET | Freq: Two times a day (BID) | ORAL | Status: DC

## 2015-02-04 NOTE — ED Notes (Signed)
Pt. arrived with EMS from home with C-collar lost his balance and fell while trying to get out of bed , brief LOC , alert and oriented at arrival , CBG= 236 , reports pain at left temporal area .

## 2015-02-04 NOTE — ED Provider Notes (Addendum)
CSN: [id number]     Arrival date & time 02/04/15  4098 History   First MD Initiated Contact with Patient 02/04/15 [phone]     Chief Complaint  Patient presents with  . Fall     HPI 44 year old male who presents after fall and confusion. 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 History is provided by the patient's girlfriend, who reports that she was woken up this morning from sleep at a 5:45 AM. She had heard a loud thump, and realized that the patient was on the ground next to his bed. He appeared "out of it" and very confused. The patient does not remember what had happened to him, but does complain of headache to the left side of his head. His growth and states that when she had heard him fall, injuring on the lights, he was awake on the ground and did not appear to have any LOC. He did not have any tongue biting or urinary incontinence. Denies any nausea vomiting, recent illnesses, chest pain, difficulty breathing, focal weakness or numbness. Headache to left side of head and 4/10 in severity, non-radiating, and believes he hit his head on dresser. I reports that he has been compliant with his Keppra. His seizure disorders most recently diagnosed in June of this year. He has had unremarkable MR imaging and EEG, but has been placed on antiepileptics due to witnessed activity.   Past Medical History  Diagnosis Date  . Diabetes mellitus without complication   . Neuropathy   . Asthma   . Vision loss of left eye   . Blindness     bilateral  . Hypercholesteremia   . Depression   . Anxiety   . Stroke 02/2014    right sided weakness/numbness   Past Surgical History  Procedure Laterality Date  . Hernia repair    . Eye surgery Left   . Abdominal surgery     Family History  Problem Relation Age of Onset  . High blood pressure Mother   . High blood pressure Sister   . High blood pressure Paternal Grandfather    History  Substance Use  Topics  . Smoking status: Current Every Day Smoker -- 0.00 packs/day    Types: Cigarettes  . Smokeless tobacco: Never Used  . Alcohol Use: Yes    Review of Systems 10/14 systems reviewed and are negative other than stated in the history of present illness  Allergies  Penicillins and Toradol  Home Medications   Prior to Admission medications   Medication Sig Start Date End Date Taking? Authorizing Provider  albuterol (PROVENTIL HFA;VENTOLIN HFA) 108 (90 BASE) MCG/ACT inhaler Inhale 2 puffs into the lungs every 6 (six) hours as needed for wheezing or shortness of breath.   Yes Historical Provider, MD  aspirin 325 MG tablet Take 1 tablet (325 mg total) by mouth daily. 05/21/14  Yes Joseph Art, DO  atorvastatin (LIPITOR) 40 MG tablet Take 40 mg by mouth at bedtime.    Yes Historical Provider, MD  FLUoxetine (PROZAC) 40 MG capsule Take 40 mg by mouth daily.   Yes Historical Provider, MD  glipiZIDE (GLUCOTROL) 5 MG tablet Take 5 mg by mouth daily before breakfast.   Yes Historical Provider, MD  ibuprofen (ADVIL,MOTRIN) 600 MG tablet Take 600 mg by mouth every 6 (six) hours as needed for moderate pain.   Yes Historical Provider, MD  metFORMIN (GLUCOPHAGE-XR) 500 MG 24 hr tablet Take 1,000 mg by  mouth 2 (two) times daily.   Yes Historical Provider, MD  Multiple Vitamins-Minerals (MULTIVITAMIN WITH MINERALS) tablet Take 1 tablet by mouth daily.   Yes Historical Provider, MD  pregabalin (LYRICA) 150 MG capsule Take 1 capsule (150 mg total) by mouth 2 (two) times daily. 12/19/14  Yes Vassie Loll, MD  levETIRAcetam (KEPPRA) 1000 MG tablet Take 1 tablet (1,000 mg total) by mouth 2 (two) times daily. 02/04/15   Lavera Guise, MD   BP 120/91 mmHg  Pulse 66  Temp(Src) 98.6 F (37 C) (Oral)  Resp 14  Ht  (1.727 m)  Wt 190 lb (86.183 kg)  BMI 28.90 kg/m2  SpO2 95% Physical Exam  Constitutional: He is oriented to person, place, and time. He appears well-developed and well-nourished.  HENT:   Head: Normocephalic and atraumatic.  Mouth/Throat: Oropharynx is clear and moist.  Neck: Normal range of motion. Neck supple.  No cervical spine tenderness  Cardiovascular: Normal rate and regular rhythm.   Pulmonary/Chest: Effort normal and breath sounds normal.  Abdominal: Soft. There is no tenderness. There is no rebound and no guarding.  Musculoskeletal: Normal range of motion.  Neurological: He is alert and oriented to person, place, and time.  Fluent speech, no facial droop, moves all extremities symmetrically,  Skin: Skin is warm and dry.  Nursing note and vitals reviewed.   ED Course  Procedures (including critical care time) Labs Review Labs Reviewed  CBC WITH DIFFERENTIAL/PLATELET - Abnormal; Notable for the following:    Platelets 108 (*)    All other components within normal limits  BASIC METABOLIC PANEL - Abnormal; Notable for the following:    Glucose, Bld 204 (*)    Creatinine, Ser 0.59 (*)    Calcium 8.2 (*)    All other components within normal limits  CBG MONITORING, ED - Abnormal; Notable for the following:    Glucose-Capillary 194 (*)    All other components within normal limits  MAGNESIUM  PHOSPHORUS    Imaging Review Ct Head Wo Contrast  02/04/2015   CLINICAL DATA:  Fall.  Loss of consciousness.  Confusion.  EXAM: CT HEAD WITHOUT CONTRAST  TECHNIQUE: Contiguous axial images were obtained from the base of the skull through the vertex without intravenous contrast.  COMPARISON:  [name] 12/18/2014, CT 12/17/2014  FINDINGS: Slight ventricular prominence is stable from prior studies. Small patchy hypodensities in the white matter are stable and most consistent with chronic ischemia.  Negative for acute infarct.  Negative for hemorrhage or mass  Negative for skull fracture. Mucosal edema in the paranasal sinuses.  IMPRESSION: No acute abnormality and no change from prior studies. Probable mild chronic microvascular ischemic change, mild   Electronically Signed   By:  Marlan Palau M.D.   On: 02/04/2015 08:25     EKG Interpretation   Date/Time:  Tuesday February 04 2015 06:46:07 EDT Ventricular Rate:  76 PR Interval:  126 QRS Duration: 82 QT Interval:  394 QTC Calculation: 443 R Axis:   65 Text Interpretation:  Sinus rhythm Borderline T wave abnormalities  Confirmed by OTTER  MD, OLGA ([id number]) on 02/04/2015 6:50:08 AM      MDM   Final diagnoses:  Fall, initial encounter  Head injury, initial encounter  Seizure    In short, this is a 44 year old male with history of prior CVA and seizure disorder who presents with fall and confusion. Vital signs are noted concerning on arrival to the ED. He appears sleepy, but is awake and oriented 3.  He is grossly neurologically intact. No evidence of head trauma noted and no traumatic injuries noted on exam. Cervical collar is clinically cleared as he does not meet any criteria for imaging. Presentation is concerning for possible seizure versus head injury given fall, head strike, and LOC. Unclear if there was true seizure activity is his significant other was asleep prior to finding him on the ground. EKG shows no stigmata of arrhythmia or concern for ischemia/infarction, and overall clinical presentation does not seem consistent with cardiac etiology. CT head was performed showing no acute intracranial processes. This patient was discussed with Dr. Gerilyn Pilgrim from Neurology, and given that he had confusion similar to prior post ictal periods without witnessed seizure, it was recommended that he up titrated his Keppra to 1000 mg twice a day with outpatient neurology follow-up w/ Dr. Roda Shutters. On my reevaluation the patient, he is reported to be at his baseline by him and his girlfriend. He has been able to ambulate steadily. Basic blood work reveals no electrolyte or metabolic derangements as etiologies of his presentation. Plan of care was discussed with this patient including up titration of his Keppra as well as close  follow-up with his neurologist as an outpatient. Seizure precautions were discussed with this patient. He expresses understanding of all discharge instructions for comfortable with the plan of care.  Lavera Guise, MD 02/04/15 1610  Lavera Guise, MD 02/04/15 1556

## 2015-02-04 NOTE — ED Notes (Signed)
Pt returned from CT °

## 2015-02-04 NOTE — Discharge Instructions (Signed)
Please call your neurologist to follow-up regarding possible seizure. Please increase your seizure medication to Keppra/levetiracetam to 1000 mg two times daily.   Return without fail for worsening symptoms, including confusion, recurrent seizure, inability to walk, do weakness or numbness, vomiting unable to keep down food or fluids, or any other symptoms concerning to Please do not drive, swim, or perform any activities that could put yourself or others in danger if you were to have a recurrent seizure. You must be cleared by your neurologist prior to these activities.

## 2015-02-11 ENCOUNTER — Ambulatory Visit (INDEPENDENT_AMBULATORY_CARE_PROVIDER_SITE_OTHER): Payer: Medicaid Other | Admitting: Neurology

## 2015-02-11 ENCOUNTER — Encounter: Payer: Self-pay | Admitting: Neurology

## 2015-02-11 VITALS — BP 112/69 | HR 80 | Ht 68.0 in | Wt 197.0 lb

## 2015-02-11 DIAGNOSIS — I10 Essential (primary) hypertension: Secondary | ICD-10-CM

## 2015-02-11 DIAGNOSIS — E1143 Type 2 diabetes mellitus with diabetic autonomic (poly)neuropathy: Secondary | ICD-10-CM | POA: Diagnosis not present

## 2015-02-11 DIAGNOSIS — R4020 Unspecified coma: Secondary | ICD-10-CM

## 2015-02-11 DIAGNOSIS — E785 Hyperlipidemia, unspecified: Secondary | ICD-10-CM | POA: Diagnosis not present

## 2015-02-11 DIAGNOSIS — R404 Transient alteration of awareness: Secondary | ICD-10-CM

## 2015-02-11 NOTE — Progress Notes (Signed)
STROKE NEUROLOGY FOLLOW UP NOTE  NAME: Miguel Diaz. DOB: 1971/03/21  REASON FOR VISIT: stroke follow up HISTORY FROM: wife and chart   Today we had the pleasure of seeing Miguel Diaz. in follow-up at our [facility]. Pt was accompanied by wife.   History Summary Miguel Diaz is a 44 y.o. male with a history of DM, DM neuropathy, HLD, blindness was first admitted in 01/2014 for syncope episode after voiding. He could not remember what happened exactly. Initial workup found a normal CBC, BMP, A1c 8.6, troponin was negative. A CT of c-spine showed no fracture, and CT head showed a 3mm punctate lesion in right posterior fossa concerning for subarachnoid hemorrhage or cerebellar hemorrhage. [name] brain showed no bleed, and the punctate lesion was smaller but present on CT head done in 2013, felt this was consistent with choroid plexus calcification. He had normal orthostatic vital signs and was monitored on tele overnight with no events, repeat EKG continued to show TWI in III and aVF.Carotid dopplers, echocardiogram, and myoview non-concerning per cardiology.    He was again admitted on 02/11/14 for acute onset right-sided numbness and weakness. Wife states that he then became nonverbal and seemed confused but no LOC for a brief period of time, then began improving in transit with EMS. On arrival to ER, he still had some right-sided weakness, but this seemed to improve but he continued to have right-sided numbness. [name] showed questionable punctate acute infarct in the left dorsal brainstem at the junction of pons and midbrain. However, this did not correspond to pt symptoms. LDL 78 and A1C 8.6. He was enrolled in SOCRATES trial and on either ASA or brilinta for stroke prevention. However, he sated that he did have headache after the initial event.  03/27/14 follow up - the patient has been doing stable. He stated that his symptoms of right side weakness and numbness are gone, only  has some bilateral toe numbness which he has before due to DM neuropathy. He stated that his sugar is better, this am 127, more strict diet at home.  He continued to have vision difficulties. He stated that her in 2013, he had left eye vision loss, sudden onset, was told to be due to diabetes or maybe congenital causes. In 01/2014, he passed out and admitted to [facility] for evaluation. He was told to have post-micturition syncope and was discharged after improvement. But wife said since then he had right eye tunnel vision. Has been followed up with eye doctor and etiology not clear.   He was admitted on 04/08/14 due to syncope. He mentions that he was trying to let his dog in the house and the next thing that he remembers was he was surrounded by EMS. He called his wife to get some help as he had fallen down although patient does not remember that. When the wife reached home within a few minutes she found that he was lying down on the floor unresponsive to verbal command. There was no incontinence or seizure-like activity is noted. There was no tongue bite noted. Patient become more alert with arrival of the EMS. EEG was normal.    He was admitted again in 05/2014 with acute onset nonverbal, not feeling right, confusion and right sided numbness/weakness associated with HA. CT showed no acute abnormality and [name] showed no acute stroke. EEG normal, no seizure. He was discharge with ASA (has finished SOCRATES trial).   On 07/05/14, pt was sent to ER  by EMS due to acute onset headache, confusion and right sided heaviness feeling. CT head negative. He was found to have elevated blood glucose at 400s. He was discharged with outpt follow up.   07/31/14 follow up He stated that he has migraine since he was young and for almost 40 years. Usually once a month, lasting 1-2 days. HA happens at the back of head, or bilateral temporal or at the top of head, 8-9/10, does not take medication for HA except sometimes tylenol. His  father had hx of migraine but found out to have aneurysm. He denies migraine in other family members.  He still has vision difficulty and recent visit of ophthalmology and was told no change of visual acuity. 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   10/07/14 follow up - he had ER visit on 09/22/14. He bend over to reach something on the floor and then abruptly stood up. He then had severe right eye pain followed by feeling very lightheaded, had a syncopal episode while sitting on the couch followed by left-sided tonic-clonic activity of the left arm and left leg during which time he was unresponsive. This lasted approximately 1-2 minutes, resolved spontaneously, he was then gradually and progressively more awake and alert. When paramedics arrived the patient was unable to ambulate, he was assisted to the stretcher and transported for evaluation. [name] in ED showed no acute infarct, he was discharged after back to baseline. His SBP in ER was between 104-127. Not sure the BP at the time of onset. He had again ER visit on 09/27/14. He was outside crushing cans with his feet, started to feel chest pain, sat down, felt sharp radiating pain to left arm, but again developed some right arm and leg weakness. He states that he occasionally will have this flareup whenever he has something else going on. [name] brain negative for acute stroke. Troponin and D-dimer negative. He was discharged in good condition.   He had ER visit on 10/10/14, wife stated that he was lying in the couch, not responding to questions, eyes open, only respond with groan sound, EMS was called, wife did not remember blood pressure at that time. On EMS arrival, patient feels better, however complains right-sided numbness, once standing up with EMS, he seemed limp on the right side. In the ER, [name] was done, no acute intracranial process. He was discharged from ER in follow-up in clinic.  11/25/14 follow up (Dr. Lucia GaskinsAhern) - Topamax was  discontinued as patient said it is not effective, he was put on Depakote for possible common dictating migraine versus seizure. Repeat EEG was normal. Continued on aspirin and Lipitor.  He was admitted on 12/17/14 as he was going out to the car and wife saw him laying in the yard. They called 911. He was brought to the emergency room. He was confused for 24 hours in the emergency room. He thought his sister was his ex-wife. Didn't know his mother's name. Didn't know the date. He was hospitalized for 2 nights. He was changed from Depakote to Keppra. EEG performed during his post ictal period was normal. CT and [name] again showed no acute intracranial process. He was discharged for Keppra 500 twice a day.  On the day of discharge 12/19/14, he had ER visit again - He went to the restroom in the afternoon and wife heard a knock and she saw him against the wall. He turned face first into the restroom door. He couldn't respond or  help. His body was actively shaking at right side. He was sent to the ED and was observed. He was confused afterwards for 4 hours. He wanted to sleep. His Keppra was increased to 750 bid.  01/02/15 follow up (Dr. Lucia Gaskins) - pt has been doing OK without LOC episodes. Keppra was continued at 750 twice a day, and pseudoseizure was entertained.   01/15/15 follow up - pt was doing OK, no recurrent episodes yet. His BP was low today in clinic 94/59. Wife showed me his BP record with orthostatic vitals, there are trend of orthostatic hypotension in 2 out of 20 occasions. Has not been checking glucose lately as he is running out of testing stripes. Patient stated that, he find out his father daughter and sister as well as his son all have seizure history, and his daughter also has migraine history. He is currently on Keppra 750 twice a day and Lyrica, aspirin and Lipitor.  ER visit on 02/04/15 - girlfriend stated that 5:15am she woke up, went to bathroom, and back-to bed, and the heard a thump sound and  she found pt was not in bed but on the floor and head against bed rail, not sure if he was LOC due to poor lightening. When she turned on the light, pt seemed woke up and asked "what happened?". No confusion but seems lethargic, BP check 143/97 and EMS called. On EMS arrival, BP even higher, glucose 220s. He was sent to ER and CT head negative, concerning for seizure and increased keppra to 1000mg  bid. He denies any HA, arm or leg weakness, but he stated that he could not remember what happened at that time. Girlfriend stated that he forgot taking keppra the night before.   Interval History During the interval time, he has no changes. He has regular psychiatrist and psychologist. He saw psychologist Dr. Levada Schilling during the interval time and was told everything looks good. Girlfriend said they are applying for nursing aid for him during the day but was rejected, and they are working on the appeal. EEG 12/04/14 was normal. Dr. Lucia Gaskins has mentioned sleep deprived EEG to them in the past.  REVIEW OF SYSTEMS: Full 14 system review of systems performed and notable only for those listed below and in HPI above, all others are negative:  Constitutional: N/A  Cardiovascular:   Ear/Nose/Throat: N/A Skin: N/A  Eyes:   Respiratory:   Gastroitestinal: N/A  Genitourinary:  Hematology/Lymphatic: N/A  Endocrine: N/A  Musculoskeletal: N/A  Allergy/Immunology: N/A Neurological: dizziness Psychiatric:  Sleep: insomnia  The following represents the patient's updated allergies and side effects list: Allergies  Allergen Reactions  . Penicillins Other (See Comments)    Unknown childhood reaction  . Toradol [Ketorolac Tromethamine] Hives    Labs since last visit of relevance include the following: Results for orders placed or performed during the hospital encounter of 02/04/15  CBC with Differential  Result Value Ref Range   WBC 7.2 4.0 - 10.5 K/uL   RBC 4.69 4.22 - 5.81 MIL/uL   Hemoglobin 15.0 13.0  - 17.0 g/dL   HCT 16.1 09.6 - 04.5 %   MCV 92.5 78.0 - 100.0 fL   MCH 32.0 26.0 - 34.0 pg   MCHC 34.6 30.0 - 36.0 g/dL   RDW 40.9 81.1 - 91.4 %   Platelets 108 (L) 150 - 400 K/uL   Neutrophils Relative % 62 43 - 77 %   Neutro Abs 4.5 1.7 - 7.7 K/uL   Lymphocytes Relative 26  12 - 46 %   Lymphs Abs 1.9 0.7 - 4.0 K/uL   Monocytes Relative 6 3 - 12 %   Monocytes Absolute 0.4 0.1 - 1.0 K/uL   Eosinophils Relative 5 0 - 5 %   Eosinophils Absolute 0.4 0.0 - 0.7 K/uL   Basophils Relative 0 0 - 1 %   Basophils Absolute 0.0 0.0 - 0.1 K/uL  Basic metabolic panel  Result Value Ref Range   Sodium 138 135 - 145 mmol/L   Potassium 3.6 3.5 - 5.1 mmol/L   Chloride 105 101 - 111 mmol/L   CO2 24 22 - 32 mmol/L   Glucose, Bld 204 (H) 65 - 99 mg/dL   BUN 13 6 - 20 mg/dL   Creatinine, Ser 4.09 (L) 0.61 - 1.24 mg/dL   Calcium 8.2 (L) 8.9 - 10.3 mg/dL   GFR calc non Af Amer >60 >60 mL/min   GFR calc Af Amer >60 >60 mL/min   Anion gap 9 5 - 15  Magnesium  Result Value Ref Range   Magnesium 1.9 1.7 - 2.4 mg/dL  Phosphorus  Result Value Ref Range   Phosphorus 3.1 2.5 - 4.6 mg/dL  POC CBG, ED  Result Value Ref Range   Glucose-Capillary 194 (H) 65 - 99 mg/dL    The neurologically relevant items on the patient's problem list and lesion he and currently he equal and were reviewed on today's visit.  Neurologic Examination  A problem focused neurological exam (12 or more points of the single system neurologic examination, vital signs counts as 1 point, cranial nerves count for 8 points) was performed.  Blood pressure 112/69, pulse 80, height  (1.727 m), weight 197 lb (89.359 kg).  General - Well nourished, well developed, in no apparent distress.  Ophthalmologic - not able to see through  Cardiovascular - Regular rate and rhythm with no murmur.  Mental Status -  Level of arousal and orientation to time, place, and person were intact. Language including expression, naming, repetition,  comprehension was assessed and found intact.  Cranial Nerves II - XII - II - left eye vision loss, not able to see light or hand waving. Right eye central vision can see Finger counting. III, IV, VI - Extraocular movements intact, pupillary reflex bilateral intact. V - Facial sensation intact bilaterally. VII - Facial movement intact bilaterally. VIII - Hearing & vestibular intact bilaterally. X - Palate elevates symmetrically. XI - Chin turning & shoulder shrug intact bilaterally. XII - Tongue protrusion intact.  Motor Strength - The patient's strength was normal in all extremities and pronator drift was absent.  Bulk was normal and fasciculations were absent.   Motor Tone - Muscle tone was assessed at the neck and appendages and was normal.  Reflexes - The patient's reflexes were normal in all extremities and he had no pathological reflexes.  Sensory - Light touch, temperature/pinprick were assessed and were normal.    Coordination - The patient had normal movements in the hands and feet with no ataxia or dysmetria.  Tremor was absent.  Gait and Station - slow and they are in with cane due to vision deficit, but no hemiparetic gait.  Data reviewed: I personally reviewed the images and agree with the radiology interpretations.  Ct Head Wo Contrast: 01/25/2014 1. Punctate 3 mm focus of increased is seen in the right posterior fossa. This could be a tiny subarachnoid hemorrhage or right cerebellar hemorrhage. 2. Right frontal sinusitis. Multiple mucous retention cyst noted throughout the  paranasal sinuses. 3. No evidence of cervical spine fracture or dislocation.   Ct Cervical Spine Wo Contrast 01/25/2014: 1. Punctate 3 mm focus of increased is seen in the right posterior fossa. This could be a tiny subarachnoid hemorrhage or right cerebellar hemorrhage. 2. Right frontal sinusitis. Multiple mucous retention cyst noted throughout the paranasal sinuses. 3. No evidence of cervical spine  fracture or dislocation.   Echocardiogram 01/26/14: Upper normal wall thickness with LVEF 60-65%, grade 2 diastolic dysfunction. MAC with trivial mitral regurgitation. PASP 12 mmHg.   Carotid dopplers: BIlateral: mild soft plaque CCA and origin ICA. 1-39% ICA stenosis. Vertebral artery flow is antegrade.  MYOCARDIAL IMAGING WITH SPECT (REST AND PHARMACOLOGIC-STRESS): No definite inducible ischemia with pharmacologic stress. 67% ejection fraction  Ct Angio Head and neck W/cm &/or Wo Cm  02/11/2014  IMPRESSION: No carotid bifurcation stenosis. Smooth atherosclerotic thickening of the vascular walls. Carotid siphon atherosclerosis bilaterally, 30% stenosis on the right and 50% stenosis on the left. No evidence of large or medium vessel embolic occlusion.   Ct Head Wo Contrast  02/11/2014  No acute finding on today's study. Chronic small vessel ischemic changes of the cerebral hemispheric white matter. Punctate density in the right cerebellum seen on 07/24 is no longer visible and presumably represented a microhemorrhage which was acute at that time. ( I think it is not the same cut and the 8/10 one still has a little cut of the hyperdensity view.)  07/05/14 1. No acute intracranial pathology seen on CT. 2. Stable prominence of the occipital horns of the lateral ventricles is thought to reflect a normal variant. 3. Mild small vessel ischemic microangiopathy. 4. Mucus retention cyst or polyp at the left maxillary sinus. 5. Bilateral soft tissue lipomas noted overlying the parietal calvarium, with slight bony remodeling noted on the left. Lipomas measure 1.8 cm on the right and 0.9 cm on the left.  12/17/14 -  CT BRAIN: 1. No acute intracranial process. 2. Mild atrophy with chronic small vessel ischemic disease. 3. Mild paranasal sinus disease as above, most prominent within the left frontoethmoidal recess. 4. Scattered small soft tissue lipomas within the bilateral parietal scalp with underlying  bony remodeling, stable. CT CERVICAL SPINE: No acute traumatic injury within the cervical spine.  02/04/15 - CT head - No acute abnormality and no change from prior studies. Probable mild chronic microvascular ischemic change, mild   [name] Brain  12/18/14 - 1. No acute intracranial infarct or other process identified. 2. Mild cerebral atrophy with mild to moderate chronic microvascular ischemic disease.  10/11/14 - No acute intracranial process, specifically no acute ischemia. Mild parenchymal brain volume loss for age. Mild to moderate white matter changes suggest chronic small vessel ischemic disease, advanced for age.  09/27/14 - No acute intracranial abnormality. Stable noncontrast [name] appearance of the brain including nonspecific white matter signal changes.  09/22/14 - 1. No acute intracranial findings. 2. Moderate chronic small vessel disease.  05/20/14 1. No acute intracranial abnormality identified. 2. Moderate chronic small vessel ischemic disease. 3. Motion degraded MRA without evidence of major intracranial arterial occlusion or significant proximal stenosis. Left MCA branch vessels not well evaluated.  04/08/14 - Chronic microvascular ischemic change in the white matter stable. Negative for acute infarct.  02/11/2014 IMPRESSION: Suggestion of a 2 mm acute infarction at the junction of the pons and midbrain dorsally on the left. This could be in the region of the trochlear nucleus, MLF or superior cerebellar peduncle. Normal intracranial MR angiography of the  large and medium size vessels.   01/25/2014 1. No evidence of acute intracranial abnormality. The punctate hyperattenuating focus near the right foramen of Luschka on recent CT also appears to have been present on the 2013 [name], although less conspicuous on the older examination. Therefore, this most likely represents calcification, such as in choroid plexus, rather than acute hemorrhage. 2. Mild-to-moderate chronic small  vessel ischemic disease.    Component     Latest Ref Rng 01/27/2014 02/12/2014  Cholesterol     0 - 200 mg/dL 956 213  Triglycerides     <150 mg/dL 086 (H) 578 (H)  HDL     >39 mg/dL 34 (L) 33 (L)  Total CHOL/HDL Ratio      4.5 4.5  VLDL     0 - 40 mg/dL 43 (H) 36  LDL (calc)     0 - 99 mg/dL 75 78  Hemoglobin I6N     <5.7 %  8.6 (H)  Mean Plasma Glucose     <117 mg/dL  629 (H)  TSH     5.284 - 4.500 uIU/mL 1.620     Assessment: As you may recall, he is a 44 y.o. Caucasian male with PMH of DM, DM neuropathy, HLD, blindness was admitted or ER visit on 10 times since 01/2014 for episodic LOC, unresponsive to verbal commands, confusion, nonverbal, right sided weakness/heavy feeling, headache, chest pain. Etiology not that clear. All the brain images are negative so far. He does have sometimes trend for orthostatic hypotension, concerning for autonomic neuropathy due to diabetes, however, the episode can happen while he is lying in couch. Concerning for seizure, however, all EEG were normal and his EEG was normal even during "post-ictal" state. Concerning for complicated migraine but he never had HA during episode and topamax and depakote were not effective. Finally, pseudoseizure is not being considered and is certainly a possibility. He has psychologist follow up but no specific concerns. His keppra was titrate up to 1000mg  bid after the last ER visit. He may need sleep deprived EEG as mentioned by Dr. Lucia Gaskins in the past.   Plan:  - Continue ASA and lipitor for stroke prevention  - OK to continue keppra 1000 mg bid for spells with unclear reason. - Follow up with your primary care physician for stroke risk factor modification. Recommend maintain blood pressure goal <130/80, diabetes with hemoglobin A1c goal below 6.5% and lipids with LDL cholesterol goal below 70 mg/dL.  - continue to check BP and glucose at home and record. Once has symptoms, check BP and glucose right away.  - continue  to follow up with psychologist and psychiatrist for anxiety and depression. - follow up in 3 months with Dr. Lucia Gaskins.  No orders of the defined types were placed in this encounter.    No orders of the defined types were placed in this encounter.    Patient Instructions  - Continue ASA and lipitor for stroke prevention  - OK to continue keppra 1000 mg bid for seizure control. - Follow up with your primary care physician for stroke risk factor modification. Recommend maintain blood pressure goal <130/80, diabetes with hemoglobin A1c goal below 6.5% and lipids with LDL cholesterol goal below 70 mg/dL.  - continue to check BP and glucose at home and record. Once has symptoms, check BP and glucose right away.  - continue to follow up with psychologist and psychiatrist for anxiety and depression. - follow up in 3 months with Dr. Lucia Gaskins  Note CC'ed to PCP via routing.   Marvel Plan, MD PhD [facility] [address] [place], [place] [address] [phone]

## 2015-02-11 NOTE — Patient Instructions (Addendum)
-   Continue ASA and lipitor for stroke prevention  - OK to continue keppra 1000 mg bid for seizure control. - Follow up with your primary care physician for stroke risk factor modification. Recommend maintain blood pressure goal <130/80, diabetes with hemoglobin A1c goal below 6.5% and lipids with LDL cholesterol goal below 70 mg/dL.  - continue to check BP and glucose at home and record. Once has symptoms, check BP and glucose right away.  - continue to follow up with psychologist and psychiatrist for anxiety and depression. - follow up in 3 months with Dr. Lucia Gaskins

## 2015-03-24 ENCOUNTER — Ambulatory Visit: Payer: Medicaid Other | Admitting: Neurology

## 2015-05-14 ENCOUNTER — Encounter: Payer: Self-pay | Admitting: Neurology

## 2015-05-14 ENCOUNTER — Ambulatory Visit (INDEPENDENT_AMBULATORY_CARE_PROVIDER_SITE_OTHER): Payer: Medicaid Other | Admitting: Neurology

## 2015-05-14 VITALS — BP 120/77 | HR 85 | Ht 68.0 in | Wt 202.4 lb

## 2015-05-14 DIAGNOSIS — R404 Transient alteration of awareness: Secondary | ICD-10-CM | POA: Diagnosis not present

## 2015-05-14 DIAGNOSIS — R4020 Unspecified coma: Secondary | ICD-10-CM

## 2015-05-14 NOTE — Progress Notes (Signed)
[facility]    Provider:  Dr Lucia Gaskins Referring Provider: No ref. provider found Primary Care Physician:  Kipp Laurence, PA-C  CC: Repeated Right-sided weakness with altered awareness   Interval History 05/14/2015:  He has been seeing Dr. Roda Shutters since June. Has had several more episodes and hospital admissions for altered consciousness. Keppra was adjusted. He has been on Keppra  twice a day since August. No events since then. No side effects from the Keppra.  Interval History 01/02/2015: 2 weeks ago he was going out to the car and wife saw him laying in the yard. They called 911. He was brought to the emergency room. He was confused for 24 hours in the emergency room. He thought his sister was his ex-wife. Didn't know his mother's name. Didn't know the date. The kept him a few nights. He was changed from Depakote to Keppra. He went to the restroom and she heard a knock and she saw him against the wall. He turned face first into the restroom door. He couldn't respond or help. He was seen at the pcp office and his body was actively shaking. He was sent to the ED and was observed. He was confused afterwards for 4 hours. He wanted to sleep. His pcp increased his Keppra to 750 bid. Another EEG was performed on the 15th during his post-ictal period which was normal. Recommend long-term EEG to Dr. Roda Shutters.   11/24/2013: Miguel Diaz Miguel Diaz. is a 44 y.o. male here as a referral from Dr. Cleda Clarks for acute onset right-sided weakness. He follows with Dr. Roda Shutters in the office however he is seeing me today as Dr. Roda Shutters is not here and patient had another episode of right-sided weakness with altered awareness. PMH of DM, DM neuropathy, HLD, blindness was admitted or or had ER visits 7 times since 01/2014 for episodic LOC, unresponsive to verbal commands, confusion, nonverbal, right sided weakness/heavy feeling, headache, chest pain. Etiology has been unclear. 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 The possibility of complicated migraines has also been question and patient was placed on Topamax. However most recently patient had another episode and this time she was laying on the couch.   In April, patient was laying on the couch and he was not responding. He was just staring off. His girl friend is with him and provides most information. Eyes wide open. Looking straight in the direction of the TV, no foaming at the mouth, no abnormal movements. The paramedics came, staring spell lasted until the paramedics came, he would occ grunt. He closed his eyes in 5 minutes but no response except a grunt when someone would say his name. He told the paramedics that his right side was numb, no pain on the right side to noxious stimuli by the paramedics. They took him to the ED and by the time he got to the ED, there was slurred speech and facial droop. There was no headache associated with this episode. He is on topamax  bid for complicated migraines. He was confused afterwards but he ok when got to the hospital. Also c/o memory loss. In [place] there was reportedly an episode of GTCS after sitting on the couch with confusion afterwards.  Reviewed notes, labs and imaging from outside physicians, which showed:   EEG normal  [name] of the brain: Personally reviewed images:  Mild ventriculomegaly likely due to global atrophy  No mass lesions or mass effect, no acute ischemia or acute events.  Nonspecific chronic white matter changes likely small vessel ischemic disease which is advanced for age   Review of Systems:  Patient complains of symptoms per HPI as well as the following symptoms: Dizziness, headache, weakness, joint pain, walking difficulty, daytime sleepiness, snoring. Pertinent negatives per HPI. All others negative.   Social History   Social History  . Marital Status:  Significant Other    Spouse Name: N/A  . Number of Children: 2  . Years of Education: 9th   Occupational History  . disabled    Social History Main Topics  . Smoking status: Current Every Day Smoker -- 0.00 packs/day    Types: Cigarettes, E-cigarettes  . Smokeless tobacco: Never Used     Comment: he does electonic cigarettes sometmes too  . Alcohol Use: 0.0 oz/week    0 Standard drinks or equivalent per week  . Drug Use: No  . Sexual Activity: Not on file   Other Topics Concern  . Not on file   Social History Narrative   ** Merged History Encounter **   Patient  Lives with his girl friend   Patient is right handed   Patient coffee daily        Family History  Problem Relation Age of Onset  . High blood pressure Mother   . Diabetes Mellitus I Mother   . High blood pressure Sister   . High blood pressure Paternal Grandfather   . High blood pressure Father   . Diabetes Mellitus I Father   . Migraines Daughter   . Parkinsonism Daughter   . Parkinsonism Son     Past Medical History  Diagnosis Date  . Diabetes mellitus without complication (HCC)   . Neuropathy (HCC)   . Asthma   . Vision loss of left eye   . Blindness     bilateral  . Hypercholesteremia   . Depression   . Anxiety   . Stroke [facility]) 02/2014    right sided weakness/numbness    Past Surgical History  Procedure Laterality Date  . Hernia repair    . Eye surgery Left   . Abdominal surgery      Current Outpatient Prescriptions  Medication Sig Dispense Refill  . albuterol (PROVENTIL HFA;VENTOLIN HFA) 108 (90 BASE) MCG/ACT inhaler Inhale 2 puffs into the lungs every 6 (six) hours as needed for wheezing or shortness of breath.    Marland Kitchen aspirin 325 MG tablet Take 1 tablet (325 mg total) by mouth daily. 30 tablet 0  . atorvastatin (LIPITOR) 40 MG tablet Take 40 mg by mouth at bedtime.     Marland Kitchen FLUoxetine (PROZAC) 40 MG capsule Take 40 mg by mouth daily.    Marland Kitchen glipiZIDE (GLUCOTROL) 5 MG tablet Take 10 mg by  mouth daily before breakfast.     . ibuprofen (ADVIL,MOTRIN) 600 MG tablet Take 600 mg by mouth every 6 (six) hours as needed for moderate pain.    Marland Kitchen levETIRAcetam (KEPPRA) 1000 MG tablet Take 1 tablet (1,000 mg total) by mouth 2 (two) times daily. 60 tablet 0  . metFORMIN (GLUCOPHAGE-XR) 500 MG 24 hr tablet Take 1,000 mg by mouth 2 (two) times daily.    . Multiple Vitamins-Minerals (MULTIVITAMIN WITH MINERALS) tablet Take 1 tablet by mouth daily.    . pregabalin (LYRICA) 150 MG capsule Take 1 capsule (150 mg total) by mouth 2 (two) times daily. 60 capsule 1   No current facility-administered medications  for this visit.    Allergies as of 05/14/2015 - Review Complete 05/14/2015  Allergen Reaction Noted  . Penicillins Other (See Comments) 01/25/2014  . Toradol [ketorolac tromethamine] Hives 01/25/2014    Vitals: BP 120/77 mmHg  Pulse 85  Ht 5\' 8"  (1.727 m)  Wt 202 lb 6.4 oz (91.808 kg)  BMI 30.78 kg/m2 Last Weight:  Wt Readings from Last 1 Encounters:  05/14/15 202 lb 6.4 oz (91.808 kg)   Last Height:   Ht Readings from Last 1 Encounters:  05/14/15 5\' 8"  (1.727 m)    Exam: Gen: NAD, conversant    Neuro: Focused Neurologic Exam  Speech:  Speech is normal; fluent and spontaneous with normal comprehension.  Cognition:  The patient is oriented to person, place, and time;   recent and remote memory intact;   language fluent;   normal attention, concentration,   fund of knowledge Cranial Nerves:  The pupils are equal, round, and reactive to light. Patient has no vision in the left eye cannot see light or shadows, right eye he can see finger counting with severely limited peripheral vision. Extraocular movements are intact. Trigeminal sensation is intact and the muscles of mastication are normal. The face is symmetric. The palate elevates in the midline. Hearing intact. Voice is normal. Shoulder shrug is normal. The tongue has normal motion  without fasciculations.    Gait:  Not ataxic  Motor Observation:  No asymmetry, no atrophy, and no involuntary movements noted. Tone:  Normal muscle tone.   Posture:  Posture is normal. normal erect   Strength:  Strength is normal in the upper and lower limbs.      Assessment/Plan: PMH of DM, DM neuropathy, HLD, blindness was admitted for had multiple ER visitsand recent admission for episodic LOC, unresponsive to verbal commands, confusion, nonverbal, right sided weakness/heavy feeling, headache, chest pain. Etiology has been unclear. 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 The possibility of complicated migraines has also been question and patient was placed on Topamax. However most recently patient had another episode and this time she was laying on the couch. He was recently admitted again for LOC, wife found him in the yard (see HPI). Concerning for seizure activity but EEG normal during post-ictal period.   Continue ASA and Lipitor for stroke prevention  Needs to follow closely with primary care for management of vascular risk factors.  Continue Keppra for seizure  Naomie DeanAntonia Keiry Kowal, MD  [facility] [address] [place], [place] [id number]  Phone [phone] Fax [phone]  A total of 30 minutes was spent face-to-face with this patient. Over half this time was spent on counseling patient on the seizure versus migraine diagnosis and different diagnostic and therapeutic options available.

## 2015-06-05 DIAGNOSIS — I471 Supraventricular tachycardia, unspecified: Secondary | ICD-10-CM

## 2015-06-05 HISTORY — DX: Supraventricular tachycardia, unspecified: I47.10

## 2015-06-05 HISTORY — DX: Supraventricular tachycardia: I47.1

## 2015-06-24 ENCOUNTER — Emergency Department (HOSPITAL_COMMUNITY): Payer: Medicaid Other

## 2015-06-24 ENCOUNTER — Emergency Department (HOSPITAL_COMMUNITY)
Admission: EM | Admit: 2015-06-24 | Discharge: 2015-06-24 | Disposition: A | Discharge status: Home / Self Care | Payer: Medicaid Other | Attending: Emergency Medicine | Admitting: Emergency Medicine

## 2015-06-24 ENCOUNTER — Encounter (HOSPITAL_COMMUNITY): Payer: Self-pay | Admitting: Emergency Medicine

## 2015-06-24 DIAGNOSIS — J45909 Unspecified asthma, uncomplicated: Secondary | ICD-10-CM | POA: Diagnosis not present

## 2015-06-24 DIAGNOSIS — R079 Chest pain, unspecified: Secondary | ICD-10-CM | POA: Diagnosis present

## 2015-06-24 DIAGNOSIS — Z880 Allergy status to penicillin: Secondary | ICD-10-CM | POA: Diagnosis not present

## 2015-06-24 DIAGNOSIS — H540 Blindness, both eyes: Secondary | ICD-10-CM | POA: Diagnosis not present

## 2015-06-24 DIAGNOSIS — R739 Hyperglycemia, unspecified: Secondary | ICD-10-CM

## 2015-06-24 DIAGNOSIS — F419 Anxiety disorder, unspecified: Secondary | ICD-10-CM | POA: Diagnosis not present

## 2015-06-24 DIAGNOSIS — Z8673 Personal history of transient ischemic attack (TIA), and cerebral infarction without residual deficits: Secondary | ICD-10-CM | POA: Diagnosis not present

## 2015-06-24 DIAGNOSIS — Z7982 Long term (current) use of aspirin: Secondary | ICD-10-CM | POA: Diagnosis not present

## 2015-06-24 DIAGNOSIS — Z79899 Other long term (current) drug therapy: Secondary | ICD-10-CM | POA: Diagnosis not present

## 2015-06-24 DIAGNOSIS — E1165 Type 2 diabetes mellitus with hyperglycemia: Secondary | ICD-10-CM | POA: Diagnosis not present

## 2015-06-24 DIAGNOSIS — F329 Major depressive disorder, single episode, unspecified: Secondary | ICD-10-CM | POA: Diagnosis not present

## 2015-06-24 DIAGNOSIS — Z8669 Personal history of other diseases of the nervous system and sense organs: Secondary | ICD-10-CM | POA: Diagnosis not present

## 2015-06-24 DIAGNOSIS — F17210 Nicotine dependence, cigarettes, uncomplicated: Secondary | ICD-10-CM | POA: Insufficient documentation

## 2015-06-24 DIAGNOSIS — I471 Supraventricular tachycardia: Secondary | ICD-10-CM | POA: Insufficient documentation

## 2015-06-24 DIAGNOSIS — E7800 Pure hypercholesterolemia, unspecified: Secondary | ICD-10-CM | POA: Diagnosis not present

## 2015-06-24 LAB — BASIC METABOLIC PANEL
Anion gap: 8 (ref 5–15)
BUN: 7 mg/dL (ref 6–20)
CHLORIDE: 109 mmol/L (ref 101–111)
CO2: 23 mmol/L (ref 22–32)
CREATININE: 0.8 mg/dL (ref 0.61–1.24)
Calcium: 8.6 mg/dL — ABNORMAL LOW (ref 8.9–10.3)
GFR calc Af Amer: >60 mL/min (ref >60)
Glucose, Bld: 350 mg/dL — ABNORMAL HIGH (ref 65–99)
Potassium: 4.4 mmol/L (ref 3.5–5.1)
Sodium: 140 mmol/L (ref 135–145)

## 2015-06-24 LAB — I-STAT TROPONIN, ED
TROPONIN I, POC: 0 ng/mL (ref 0.00–0.08)
Troponin i, poc: 0 ng/mL (ref 0.00–0.08)

## 2015-06-24 LAB — CBC
HCT: 42.4 % (ref 39.0–52.0)
HEMOGLOBIN: 14.6 g/dL (ref 13.0–17.0)
MCH: 31.7 pg (ref 26.0–34.0)
MCHC: 34.4 g/dL (ref 30.0–36.0)
MCV: 92 fL (ref 78.0–100.0)
Platelets: 106 10*3/uL — ABNORMAL LOW (ref 150–400)
RBC: 4.61 MIL/uL (ref 4.22–5.81)
RDW: 12.7 % (ref 11.5–15.5)
WBC: 5.6 10*3/uL (ref 4.0–10.5)

## 2015-06-24 LAB — CBG MONITORING, ED: GLUCOSE-CAPILLARY: 233 mg/dL — AB (ref 65–99)

## 2015-06-24 MED ORDER — SODIUM CHLORIDE 0.9 % IV BOLUS (SEPSIS)
500.0000 mL | Freq: Once | INTRAVENOUS | Status: AC
  Administered 2015-06-24: 500 mL via INTRAVENOUS

## 2015-06-24 MED ORDER — INSULIN ASPART 100 UNIT/ML ~~LOC~~ SOLN
10.0000 [IU] | Freq: Once | SUBCUTANEOUS | Status: DC
  Filled 2015-06-24: qty 1

## 2015-06-24 MED ORDER — MORPHINE SULFATE (PF) 2 MG/ML IV SOLN
2.0000 mg | Freq: Once | INTRAVENOUS | Status: AC
  Administered 2015-06-24: 2 mg via INTRAVENOUS
  Filled 2015-06-24: qty 1

## 2015-06-24 MED ORDER — SODIUM CHLORIDE 0.9 % IV BOLUS (SEPSIS): 250.0000 mL | Freq: Once | INTRAVENOUS | Status: DC

## 2015-06-24 MED ORDER — SODIUM CHLORIDE 0.9 % IV SOLN
INTRAVENOUS | Status: DC
Start: 2015-06-24 — End: 2015-06-24
  Administered 2015-06-24: 13:00:00 via INTRAVENOUS

## 2015-06-24 MED ORDER — ONDANSETRON HCL 4 MG/2ML IJ SOLN
4.0000 mg | Freq: Once | INTRAMUSCULAR | Status: AC
  Administered 2015-06-24: 4 mg via INTRAVENOUS
  Filled 2015-06-24: qty 2

## 2015-06-24 MED ORDER — SODIUM CHLORIDE 0.9 % IV SOLN: INTRAVENOUS | Status: DC

## 2015-06-24 NOTE — ED Provider Notes (Addendum)
CSN: [id number]     Arrival date & time 06/24/15  1204 History   First MD Initiated Contact with Patient 06/24/15 1208     Chief Complaint  Patient presents with  . Tachycardia     (Consider location/radiation/quality/duration/timing/severity/associated sxs/prior Treatment) The history is provided by the patient.   44 year old male with a history of diabetes neuropathy blindness high cholesterol patient with complete vision loss left eye. At 11:00 at acute onset of fast heart rate. EMS called to the house noted to be in SVT rate around 200 given adenosine 6 mg with no change in given 12 mg with conversion back to a sinus tachycardia. Patient following the correction of the heart rate was complaining of chest discomfort in the left lower part of the chest describes tightness.  Past Medical History  Diagnosis Date  . Diabetes mellitus without complication (HCC)   . Neuropathy (HCC)   . Asthma   . Vision loss of left eye   . Blindness     bilateral  . Hypercholesteremia   . Depression   . Anxiety   . Stroke [facility]) 02/2014    right sided weakness/numbness   Past Surgical History  Procedure Laterality Date  . Hernia repair    . Eye surgery Left   . Abdominal surgery     Family History  Problem Relation Age of Onset  . High blood pressure Mother   . Diabetes Mellitus I Mother   . High blood pressure Sister   . High blood pressure Paternal Grandfather   . High blood pressure Father   . Diabetes Mellitus I Father   . Migraines Daughter   . Parkinsonism Daughter   . Parkinsonism Son    Social History  Substance Use Topics  . Smoking status: Current Every Day Smoker -- 0.00 packs/day    Types: Cigarettes, E-cigarettes  . Smokeless tobacco: Never Used     Comment: he does electonic cigarettes sometmes too  . Alcohol Use: 0.0 oz/week    0 Standard drinks or equivalent per week    Review of Systems  Constitutional: Positive for fatigue.  HENT: Negative for congestion.    Eyes: Positive for visual disturbance.  Respiratory: Positive for chest tightness. Negative for shortness of breath.   Cardiovascular: Positive for chest pain and palpitations. Negative for leg swelling.  Gastrointestinal: Negative for nausea, vomiting and abdominal pain.  Genitourinary: Negative for dysuria.  Musculoskeletal: Negative for back pain.  Skin: Negative for pallor.  Neurological: Negative for headaches.  Hematological: Does not bruise/bleed easily.  Psychiatric/Behavioral: Negative for confusion.      Allergies  Penicillins and Toradol  Home Medications   Prior to Admission medications   Medication Sig Start Date End Date Taking? Authorizing Provider  albuterol (PROVENTIL HFA;VENTOLIN HFA) 108 (90 BASE) MCG/ACT inhaler Inhale 2 puffs into the lungs every 6 (six) hours as needed for wheezing or shortness of breath.    Historical Provider, MD  aspirin 325 MG tablet Take 1 tablet (325 mg total) by mouth daily. 05/21/14   Joseph Art, DO  atorvastatin (LIPITOR) 40 MG tablet Take 40 mg by mouth at bedtime.     Historical Provider, MD  FLUoxetine (PROZAC) 40 MG capsule Take 40 mg by mouth daily.    Historical Provider, MD  glipiZIDE (GLUCOTROL) 5 MG tablet Take 10 mg by mouth daily before breakfast.     Historical Provider, MD  ibuprofen (ADVIL,MOTRIN) 600 MG tablet Take 600 mg by mouth every 6 (six) hours as  needed for moderate pain.    Historical Provider, MD  levETIRAcetam (KEPPRA) 1000 MG tablet Take 1 tablet (1,000 mg total) by mouth 2 (two) times daily. 02/04/15   Lavera Guise, MD  metFORMIN (GLUCOPHAGE-XR) 500 MG 24 hr tablet Take 1,000 mg by mouth 2 (two) times daily.    Historical Provider, MD  Multiple Vitamins-Minerals (MULTIVITAMIN WITH MINERALS) tablet Take 1 tablet by mouth daily.    Historical Provider, MD  pregabalin (LYRICA) 150 MG capsule Take 1 capsule (150 mg total) by mouth 2 (two) times daily. 12/19/14   Vassie Loll, MD   BP 119/86 mmHg  Pulse 96   Temp(Src) 97.3 F (36.3 C) (Oral)  Resp 13  SpO2 98% Physical Exam  Constitutional: He is oriented to person, place, and time. He appears well-developed and well-nourished. No distress.  HENT:  Head: Normocephalic and atraumatic.  Mouth/Throat: Oropharynx is clear and moist.  Eyes: Conjunctivae and EOM are normal. Pupils are equal, round, and reactive to light.  Neck: Normal range of motion. Neck supple.  Cardiovascular: Normal rate and normal heart sounds.   No murmur heard. Pulmonary/Chest: Effort normal and breath sounds normal. No respiratory distress.  Abdominal: Soft. Bowel sounds are normal. There is no tenderness.  Musculoskeletal: Normal range of motion.  Neurological: He is alert and oriented to person, place, and time. No cranial nerve deficit. He exhibits normal muscle tone. Coordination normal.  Skin: Skin is warm.  Nursing note and vitals reviewed.   ED Course  Procedures (including critical care time) Labs Review Labs Reviewed  BASIC METABOLIC PANEL - Abnormal; Notable for the following:    Glucose, Bld 350 (*)    Calcium 8.6 (*)    All other components within normal limits  CBC - Abnormal; Notable for the following:    Platelets 106 (*)    All other components within normal limits  I-STAT TROPOININ, ED   Results for orders placed or performed during the hospital encounter of 06/24/15  Basic metabolic panel  Result Value Ref Range   Sodium 140 135 - 145 mmol/L   Potassium 4.4 3.5 - 5.1 mmol/L   Chloride 109 101 - 111 mmol/L   CO2 23 22 - 32 mmol/L   Glucose, Bld 350 (H) 65 - 99 mg/dL   BUN 7 6 - 20 mg/dL   Creatinine, Ser 1.61 0.61 - 1.24 mg/dL   Calcium 8.6 (L) 8.9 - 10.3 mg/dL   GFR calc non Af Amer >60 >60 mL/min   GFR calc Af Amer >60 >60 mL/min   Anion gap 8 5 - 15  CBC  Result Value Ref Range   WBC 5.6 4.0 - 10.5 K/uL   RBC 4.61 4.22 - 5.81 MIL/uL   Hemoglobin 14.6 13.0 - 17.0 g/dL   HCT 09.6 04.5 - 40.9 %   MCV 92.0 78.0 - 100.0 fL   MCH  31.7 26.0 - 34.0 pg   MCHC 34.4 30.0 - 36.0 g/dL   RDW 81.1 91.4 - 78.2 %   Platelets 106 (L) 150 - 400 K/uL  I-stat troponin, ED (not at [facility], [facility])  Result Value Ref Range   Troponin i, poc 0.00 0.00 - 0.08 ng/mL   Comment 3             Imaging Review Dg Chest Portable 1 View  06/24/2015  CLINICAL DATA:  Tachycardia, shortness of Breath EXAM: PORTABLE CHEST 1 VIEW COMPARISON:  12/17/2014 FINDINGS: Cardiomediastinal silhouette is stable. No acute infiltrate or pleural effusion.  No pulmonary edema. Bony thorax is unremarkable. IMPRESSION: No active disease. Electronically Signed   By: Natasha MeadLiviu  Pop M.D.   On: 06/24/2015 12:49   I have personally reviewed and evaluated these images and lab results as part of my medical decision-making.   EKG Interpretation   Date/Time:  Tuesday June 24 2015 12:13:09 EST Ventricular Rate:  112 PR Interval:  120 QRS Duration: 74 QT Interval:  340 QTC Calculation: 464 R Axis:   57 Text Interpretation:  Sinus tachycardia Borderline T abnormalities,  diffuse leads No significant change since last tracing Confirmed by  Mikalah Skyles  MD, Normalee Sistare ([id number]) on 06/24/2015 12:19:39 PM      MDM   Final diagnoses:  SVT (supraventricular tachycardia) (HCC)  Hyperglycemia    [facility] Patient the with acute onset of heart racing and palpitations at approximately 11:00 this morning. When EMS arrived they determined in SVT with a rate around 200. EMS gave 6 mg of adenosine per protocol without any improvement. They gave 12 mg of adenosine and patient converted to sinus tach with a rate around 120. He also gave the patient aspirin and one nitroglycerin in route for chest pressure. Patient still had chest pressure upon arrival. Even though the heart rate was slightly over 100. And consistent with sinus tach. Patient's initial troponin was negative patient's chest x-ray negative for pneumothorax pneumonia or pulmonary  edema. EKG as mentioned was sinus tach with a heart rate around 112. Flexor lites without significant abnormalities other than an elevated glucose at 350. Patient has a history of diabetes. Patient was given 10 units of regular insulin patient will have a repeat troponin at 3:30. If blood sugar stabilizes out and second troponin is negative patient can be discharged home with follow-up with Dr. Patty SermonsBrackbill cardiologist he was assigned to in the past but has not seen him lately. Patient's had a prior history of fast heart rate several years ago. Here patient was given some IV morphine with the resolution of the chest pain.  Repeat troponin was negative. Patient stable for discharge home and follow-up with cardiology.  Vanetta MuldersScott Jauan Wohl, MD 06/24/15 1444  Vanetta MuldersScott Garo Heidelberg, MD 06/24/15 1623

## 2015-06-24 NOTE — Discharge Instructions (Signed)
Appointment to follow-up with cardiology give you Dr. Yevonne PaxBrackbill's office number as well as the general cardiology number. If heart starts going fast again and does not correct itself in now 20-40 minutes need to return. Return for any new or worse symptoms.

## 2015-06-24 NOTE — ED Notes (Signed)
Per EMS, pt coming from home. Pt started feeling palpitations. Upon EMS arrival pt was found to be in SVT with a rate of 200-220. EMS gave 6 of adenosine with no change followed by 12 more of adenosine which converted the pt to ST rate of 120. Pt also given 324 of aspirin and 1 nitro in route for CP which he says feels like a pressure. Pt alert x4. NAD at this time.

## 2015-08-07 ENCOUNTER — Ambulatory Visit: Payer: Self-pay | Admitting: Cardiology

## 2015-08-18 ENCOUNTER — Encounter: Payer: Self-pay | Admitting: Cardiology

## 2015-08-18 ENCOUNTER — Ambulatory Visit (INDEPENDENT_AMBULATORY_CARE_PROVIDER_SITE_OTHER): Payer: Medicaid Other | Admitting: Cardiology

## 2015-08-18 VITALS — BP 140/70 | HR 92 | Ht 68.0 in | Wt 204.4 lb

## 2015-08-18 DIAGNOSIS — I10 Essential (primary) hypertension: Secondary | ICD-10-CM

## 2015-08-18 DIAGNOSIS — Z720 Tobacco use: Secondary | ICD-10-CM

## 2015-08-18 DIAGNOSIS — I471 Supraventricular tachycardia: Secondary | ICD-10-CM | POA: Diagnosis not present

## 2015-08-18 DIAGNOSIS — I871 Compression of vein: Secondary | ICD-10-CM | POA: Diagnosis not present

## 2015-08-18 MED ORDER — METOPROLOL SUCCINATE ER 25 MG PO TB24: 25.0000 mg | ORAL_TABLET | Freq: Every day | ORAL | Status: DC

## 2015-08-18 NOTE — Patient Instructions (Signed)
Medication Instructions:  START TOPROL XL (METOPROLOL) 25 MG ONE DAILY, RX SENT TO PHARMACY  Labwork: NONE  Testing/Procedures: NONE  Follow-Up: Your physician wants you to follow-up in: 6 MONTH OV/EKG WITH DR Eden Emms  You will receive a reminder letter in the mail two months in advance. If you don't receive a letter, please call our office to schedule the follow-up appointment.  If you need a refill on your cardiac medications before your next appointment, please call your pharmacy.

## 2015-08-18 NOTE — Progress Notes (Signed)
Cardiology Office Note   Date:  08/18/2015   ID:  Miguel Coe., DOB February 11, 1971, MRN [id number]  PCP:  Miguel Laurence, PA-C  Cardiologist: Cassell Clement MD  No chief complaint on file.     History of Present Illness: Miguel Pittinger. is a 45 y.o. male who presents for 6 month follow-up visit  HPI: This is a 45 year old male patient who was seen in the hospital in consult by Dr. Patty Sermons in July 2015 for syncope of undetermined etiology, possible micturition syncope. He was having atypical chest pain as well and has multiple cardiac risk factors for CAD including premature coronary disease with a strong family history, diabetes mellitus, hypercholesterolemia and a long-time smoker.  He had a cardiac monitor for 30 days in October 2015 that was normal. Stress Myoview 01/27/14 no definite inducible ischemia with pharmacologic stress EF 67%. His most recent echocardiogram on 05/20/14 showed normal left jugular systolic function with ejection fraction 55-60%.  I systolic parameters were normal.  Patient has had multiple admissions with recurrent falls as well as a CVA in August 2015(SOCRATEs trial-aspirin vs ticagrelor). He says he gets dizzy if he stands up quickly. The last time he went to the emergency room 11/21/15he was bending over to tie shoes and started up quickly and fell over he never lost consciousness. MI was ruled out and no arrhythmias were documented. His lisinopril had been stopped because of hypotension. He was on this for his kidneys. CT angios the chest showed no evidence of significant pulmonary embolus. Another 2-D echo in November/16/15 normal systolic function EF 55-60% and states LV diastolic function parameters were normal. Severely dilated coronary sinus is consistent with persistent left superior vena cava syndrome which is rarely a significant clinical abnormality unless the patient requires cardiac rhythm device. If necessary this can be confirmed  using a left antecubital vein saline contrast study.  Since last visit the patient has been feeling better. His PCP has started him on Topamax and he has had no further episodes of syncope or collapse. Typically he would have a headache prior to the onset of his syncope. The patient has made a change in his diet also. He is avoiding caffeine. He has cut back on his cigarette smoking from 2 packs a day to one half pack a day. The patient was seen in the emergency room on 06/24/15 for supraventricular tachycardia at a heart rate of 220/m.  He failed to respond to 6 mg of adenosine IV but did respond to 12 mg adenosine IV.   Past Medical History  Diagnosis Date  . Diabetes mellitus without complication (HCC)   . Neuropathy (HCC)   . Asthma   . Vision loss of left eye   . Blindness     bilateral  . Hypercholesteremia   . Depression   . Anxiety   . Stroke [facility]) 02/2014    right sided weakness/numbness    Past Surgical History  Procedure Laterality Date  . Hernia repair    . Eye surgery Left   . Abdominal surgery       Current Outpatient Prescriptions  Medication Sig Dispense Refill  . albuterol (PROVENTIL HFA;VENTOLIN HFA) 108 (90 BASE) MCG/ACT inhaler Inhale 2 puffs into the lungs every 6 (six) hours as needed for wheezing or shortness of breath.    Marland Kitchen aspirin 325 MG tablet Take 1 tablet (325 mg total) by mouth daily. 30 tablet 0  . atorvastatin (LIPITOR) 40 MG tablet Take  40 mg by mouth at bedtime.     Marland Kitchen FLUoxetine (PROZAC) 40 MG capsule Take 40 mg by mouth daily.    . fluticasone (FLONASE) 50 MCG/ACT nasal spray Place 1 spray into both nostrils daily as needed for allergies or rhinitis.    Marland Kitchen glipiZIDE (GLUCOTROL XL) 10 MG 24 hr tablet Take 10 mg by mouth daily with breakfast.    . ibuprofen (ADVIL,MOTRIN) 600 MG tablet Take 600 mg by mouth every 6 (six) hours as needed for moderate pain.    Marland Kitchen levETIRAcetam (KEPPRA) 1000 MG tablet Take 1 tablet (1,000 mg total) by mouth 2  (two) times daily. 60 tablet 0  . metFORMIN (GLUCOPHAGE-XR) 500 MG 24 hr tablet Take 1,000 mg by mouth 2 (two) times daily.    . Multiple Vitamins-Minerals (MULTIVITAMIN WITH MINERALS) tablet Take 1 tablet by mouth daily.    . pregabalin (LYRICA) 150 MG capsule Take 1 capsule (150 mg total) by mouth 2 (two) times daily. 60 capsule 1  . metoprolol succinate (TOPROL XL) 25 MG 24 hr tablet Take 1 tablet (25 mg total) by mouth daily. 90 tablet 2   No current facility-administered medications for this visit.    Allergies:   Penicillins and Toradol    Social History:  The patient  reports that he has been smoking Cigarettes and E-cigarettes.  He has been smoking about 0.00 packs per day. He has never used smokeless tobacco. He reports that he drinks alcohol. He reports that he does not use illicit drugs.   Family History:  The patient's family history includes Diabetes Mellitus I in his father and mother; High blood pressure in his father, mother, paternal grandfather, and sister; Migraines in his daughter; Parkinsonism in his daughter and son.    ROS:  Please see the history of present illness.   Otherwise, review of systems are positive for none.   All other systems are reviewed and negative.    PHYSICAL EXAM: VS:  BP 140/70 mmHg  Pulse 92  Ht 5\' 8"  (1.727 m)  Wt 204 lb 6.4 oz (92.715 kg)  BMI 31.09 kg/m2 , BMI Body mass index is 31.09 kg/(m^2). GEN: Well nourished, well developed, in no acute distress HEENT: normal Neck: no JVD, carotid bruits, or masses Cardiac: RRR; no murmurs, rubs, or gallops,no edema  Respiratory:  clear to auscultation bilaterally, normal work of breathing GI: soft, nontender, nondistended, + BS MS: no deformity or atrophy Skin: warm and dry, no rash Neuro:  Strength and sensation are intact Psych: euthymic mood, full affect   EKG:  EKG is ordered today. The ekg ordered today demonstrates sinus rhythm.  Heart rate is 94 bpm.  There are nonspecific T-wave  changes.  Since last tracing of 06/24/15, heart rate is slower.   Recent Labs: 12/19/2014: ALT 28 02/04/2015: Magnesium 1.9 06/24/2015: BUN 7; Creatinine, Ser 0.80; Hemoglobin 14.6; Platelets 106*; Potassium 4.4; Sodium 140    Lipid Panel    Component Value Date/Time   CHOL 100 05/20/2014 0434   TRIG 70 05/20/2014 0434   HDL 31* 05/20/2014 0434   CHOLHDL 3.2 05/20/2014 0434   VLDL 14 05/20/2014 0434   LDLCALC 55 05/20/2014 0434      Wt Readings from Last 3 Encounters:  08/18/15 204 lb 6.4 oz (92.715 kg)  05/14/15 202 lb 6.4 oz (91.808 kg)  02/11/15 197 lb (89.359 kg)        ASSESSMENT AND PLAN:  1.  Essential hypertension 2. Previous orthostatic hypotension, improved off lisinopril  3. Tobacco abuse 4. Type 2 diabetes mellitus with diabetic neuropathy 5. Persistent left superior vena cava 6.  Paroxysmal supraventricular tachycardia.  Current medicines are reviewed at length with the patient today.  The patient does not have concerns regarding medicines.  The following changes have been made:  Start metoprolol succinate 25 mg one daily to help prevent future episodes of SVT  Labs/ tests ordered today include:   Orders Placed This Encounter  Procedures  . EKG 12-Lead   Disposition: Recheck in 6 months for office visit and EKG with Dr. Charlton Haws  Signed, Cassell Clement MD 08/18/2015 5:36 PM    [facility] [address] [place], [place], [place]  [address] Phone: [phone]; Fax: [phone]

## 2015-09-24 ENCOUNTER — Emergency Department (HOSPITAL_COMMUNITY)
Admission: EM | Admit: 2015-09-24 | Discharge: 2015-09-24 | Disposition: A | Discharge status: Home / Self Care | Payer: Medicaid Other | Attending: Emergency Medicine | Admitting: Emergency Medicine

## 2015-09-24 ENCOUNTER — Emergency Department (HOSPITAL_COMMUNITY): Payer: Medicaid Other

## 2015-09-24 ENCOUNTER — Encounter (HOSPITAL_COMMUNITY): Payer: Self-pay | Admitting: Emergency Medicine

## 2015-09-24 DIAGNOSIS — R42 Dizziness and giddiness: Secondary | ICD-10-CM | POA: Diagnosis not present

## 2015-09-24 DIAGNOSIS — W1839XA Other fall on same level, initial encounter: Secondary | ICD-10-CM | POA: Insufficient documentation

## 2015-09-24 DIAGNOSIS — Z7982 Long term (current) use of aspirin: Secondary | ICD-10-CM | POA: Diagnosis not present

## 2015-09-24 DIAGNOSIS — E119 Type 2 diabetes mellitus without complications: Secondary | ICD-10-CM | POA: Insufficient documentation

## 2015-09-24 DIAGNOSIS — S4991XA Unspecified injury of right shoulder and upper arm, initial encounter: Secondary | ICD-10-CM | POA: Insufficient documentation

## 2015-09-24 DIAGNOSIS — F17210 Nicotine dependence, cigarettes, uncomplicated: Secondary | ICD-10-CM | POA: Diagnosis not present

## 2015-09-24 DIAGNOSIS — Z880 Allergy status to penicillin: Secondary | ICD-10-CM | POA: Insufficient documentation

## 2015-09-24 DIAGNOSIS — Y9389 Activity, other specified: Secondary | ICD-10-CM | POA: Insufficient documentation

## 2015-09-24 DIAGNOSIS — M25511 Pain in right shoulder: Secondary | ICD-10-CM

## 2015-09-24 DIAGNOSIS — F419 Anxiety disorder, unspecified: Secondary | ICD-10-CM | POA: Insufficient documentation

## 2015-09-24 DIAGNOSIS — Z79899 Other long term (current) drug therapy: Secondary | ICD-10-CM | POA: Diagnosis not present

## 2015-09-24 DIAGNOSIS — F329 Major depressive disorder, single episode, unspecified: Secondary | ICD-10-CM | POA: Insufficient documentation

## 2015-09-24 DIAGNOSIS — H543 Unqualified visual loss, both eyes: Secondary | ICD-10-CM | POA: Diagnosis not present

## 2015-09-24 DIAGNOSIS — Y9289 Other specified places as the place of occurrence of the external cause: Secondary | ICD-10-CM | POA: Insufficient documentation

## 2015-09-24 DIAGNOSIS — Y998 Other external cause status: Secondary | ICD-10-CM | POA: Diagnosis not present

## 2015-09-24 DIAGNOSIS — J45909 Unspecified asthma, uncomplicated: Secondary | ICD-10-CM | POA: Diagnosis not present

## 2015-09-24 DIAGNOSIS — E7800 Pure hypercholesterolemia, unspecified: Secondary | ICD-10-CM | POA: Insufficient documentation

## 2015-09-24 DIAGNOSIS — S79911A Unspecified injury of right hip, initial encounter: Secondary | ICD-10-CM | POA: Insufficient documentation

## 2015-09-24 DIAGNOSIS — Z7984 Long term (current) use of oral hypoglycemic drugs: Secondary | ICD-10-CM | POA: Insufficient documentation

## 2015-09-24 DIAGNOSIS — M25551 Pain in right hip: Secondary | ICD-10-CM

## 2015-09-24 DIAGNOSIS — Z8673 Personal history of transient ischemic attack (TIA), and cerebral infarction without residual deficits: Secondary | ICD-10-CM | POA: Insufficient documentation

## 2015-09-24 DIAGNOSIS — W19XXXA Unspecified fall, initial encounter: Secondary | ICD-10-CM

## 2015-09-24 MED ORDER — HYDROCODONE-ACETAMINOPHEN 5-325 MG PO TABS: 1.0000 | ORAL_TABLET | Freq: Four times a day (QID) | ORAL | Status: DC | PRN

## 2015-09-24 MED ORDER — HYDROCODONE-ACETAMINOPHEN 5-325 MG PO TABS
1.0000 | ORAL_TABLET | Freq: Once | ORAL | Status: AC
  Administered 2015-09-24: 1 via ORAL
  Filled 2015-09-24: qty 1

## 2015-09-24 NOTE — ED Notes (Signed)
Per EMS-states he fell today reaching for cane-complaining of right shoulder and leg pain

## 2015-09-24 NOTE — Discharge Instructions (Signed)

## 2015-09-24 NOTE — ED Provider Notes (Signed)
CSN: [id number]     Arrival date & time 09/24/15  1238 History  By signing my name below, I, Miguel Diaz, attest that this documentation has been prepared under the direction and in the presence of [name], PA-C. Electronically Signed: Placido SouLogan Diaz, ED Scribe. 09/24/2015. 3:30 PM.    Chief Complaint  Patient presents with  . Fall   The history is provided by the patient. No language interpreter was used.    HPI Comments: Miguel CoeGeorge L Buchler Jr. is a 45 y.o. male with a PMHx of seizures, neuropathy, blindness, stroke and DM who presents to the Emergency Department by ambulance complaining of a fall that occurred 4 hours ago. He confirms his PMHx of seizures which he denies occurred noting that he dropped his cane while outside and when bending over to pick it up fell forwards in his yard resulting in constant, moderate, burning, right shoulder pain and constant, moderate, right leg pain. He additionally reports constant, mild, lightheadedness. He reports ambulating with a cane at baseline and denies being ambulatory since his fall. Pt denies taking any medications for pain management. Pt confirms his listed allergies noting that Toradol causes him to break out in hives. He denies HA, head trauma, CP, SOB, neck pain, LOC or any other associated symptoms at this time.   Past Medical History  Diagnosis Date  . Diabetes mellitus without complication (HCC)   . Neuropathy (HCC)   . Asthma   . Vision loss of left eye   . Blindness     bilateral  . Hypercholesteremia   . Depression   . Anxiety   . Stroke [facility](H[facility]) 02/2014    right sided weakness/numbness   Past Surgical History  Procedure Laterality Date  . Hernia repair    . Eye surgery Left   . Abdominal surgery     Family History  Problem Relation Age of Onset  . High blood pressure Mother   . Diabetes Mellitus I Mother   . High blood pressure Sister   . High blood pressure Paternal Grandfather   . High blood pressure Father   .  Diabetes Mellitus I Father   . Migraines Daughter   . Parkinsonism Daughter   . Parkinsonism Son    Social History  Substance Use Topics  . Smoking status: Current Every Day Smoker -- 0.00 packs/day    Types: Cigarettes, E-cigarettes  . Smokeless tobacco: Never Used     Comment: he does electonic cigarettes sometmes too  . Alcohol Use: 0.0 oz/week    0 Standard drinks or equivalent per week    Review of Systems  Respiratory: Negative for shortness of breath.   Cardiovascular: Negative for chest pain.  Musculoskeletal: Positive for myalgias and arthralgias. Negative for neck pain and neck stiffness.  Skin: Negative for wound.  Neurological: Positive for light-headedness. Negative for syncope and headaches.   Allergies  Penicillins and Toradol  Home Medications   Prior to Admission medications   Medication Sig Start Date End Date Taking? Authorizing Provider  albuterol (PROVENTIL HFA;VENTOLIN HFA) 108 (90 BASE) MCG/ACT inhaler Inhale 2 puffs into the lungs every 6 (six) hours as needed for wheezing or shortness of breath.    Historical Provider, MD  aspirin 325 MG tablet Take 1 tablet (325 mg total) by mouth daily. 05/21/14   Joseph ArtJessica U Vann, DO  atorvastatin (LIPITOR) 40 MG tablet Take 40 mg by mouth at bedtime.     Historical Provider, MD  FLUoxetine (PROZAC) 40 MG capsule Take  40 mg by mouth daily.    Historical Provider, MD  fluticasone (FLONASE) 50 MCG/ACT nasal spray Place 1 spray into both nostrils daily as needed for allergies or rhinitis.    Historical Provider, MD  glipiZIDE (GLUCOTROL XL) 10 MG 24 hr tablet Take 10 mg by mouth daily with breakfast.    Historical Provider, MD  ibuprofen (ADVIL,MOTRIN) 600 MG tablet Take 600 mg by mouth every 6 (six) hours as needed for moderate pain.    Historical Provider, MD  levETIRAcetam (KEPPRA) 1000 MG tablet Take 1 tablet (1,000 mg total) by mouth 2 (two) times daily. 02/04/15   Lavera Guise, MD  metFORMIN (GLUCOPHAGE-XR) 500 MG 24 hr  tablet Take 1,000 mg by mouth 2 (two) times daily.    Historical Provider, MD  metoprolol succinate (TOPROL XL) 25 MG 24 hr tablet Take 1 tablet (25 mg total) by mouth daily. 08/18/15   Cassell Clement, MD  Multiple Vitamins-Minerals (MULTIVITAMIN WITH MINERALS) tablet Take 1 tablet by mouth daily.    Historical Provider, MD  pregabalin (LYRICA) 150 MG capsule Take 1 capsule (150 mg total) by mouth 2 (two) times daily. 12/19/14   Vassie Loll, MD   BP 106/82 mmHg  Pulse 78  Temp(Src) 98 F (36.7 C) (Oral)  Resp 17  SpO2 95% Physical Exam  Constitutional: He is oriented to person, place, and time. He appears well-developed and well-nourished.  HENT:  Head: Normocephalic and atraumatic.  Right Ear: No hemotympanum.  Left Ear: No hemotympanum.  No facial tenderness; scalp nontender  Eyes: EOM are normal.  Neck: Normal range of motion.  No c-spine tenderness  Cardiovascular: Normal rate, regular rhythm and normal heart sounds.  Exam reveals no gallop and no friction rub.   No murmur heard. Pulmonary/Chest: Effort normal and breath sounds normal. No respiratory distress. He has no wheezes. He has no rales.  Clear to auscultation bilaterally   Abdominal: Soft. He exhibits no distension. There is no tenderness.  Musculoskeletal: He exhibits tenderness.  Right shoulder: tenderness to anterior aspects of right shoulder; DROM due to pain; no gross deformity; 5/5 grip strength; brisk cap refill; DP/PT pulses 2+ bilaterally; nml strength with flexion and extension Right hip: TTP along greater trochanter; decreased hip flexion, extension and abduction secondary to pain; leg is not shortened or internally rotated No significant midline spinal tenderness, crepitus or step offs  Neurological: He is alert and oriented to person, place, and time.  Skin: Skin is warm and dry.  Psychiatric: He has a normal mood and affect.  Nursing note and vitals reviewed.  ED Course  Procedures  DIAGNOSTIC  STUDIES: Oxygen Saturation is 95% on RA, adequate by my interpretation.    COORDINATION OF CARE: 3:27 PM Pt presents today due to associated pain from a fall. Discussed treatment plan with pt at bedside including hydrocodone and reevaluation based on imaging results. Pt agreed to plan.  4:58 PM X-ray of right shoulder and right hip without any acute fractures or dislocation. 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 Orthopedic referral given as needed. Patient was able to ambulate with assistance.  Pt stable for discharge.   Labs Review Labs Reviewed - No data to display  Imaging Review Dg Shoulder Right  09/24/2015  CLINICAL DATA:  Fall EXAM: RIGHT SHOULDER - 2+ VIEW COMPARISON:  01/25/2014 FINDINGS: No acute fracture. No dislocation.  Unremarkable soft tissues. IMPRESSION: No acute bony pathology. Electronically Signed   By: Merton Border  Hoss M.D.   On: 09/24/2015 15:56   Dg Hip Unilat With Pelvis 2-3 Views Right  09/24/2015  CLINICAL DATA:  Pain following fall EXAM: DG HIP (WITH OR WITHOUT PELVIS) 2-3V RIGHT COMPARISON:  None. FINDINGS: Frontal pelvis as well as frontal and lateral right hip images were obtained. There is no fracture or dislocation. The joint spaces appear normal. There is symmetric bony overgrowth along both lateral superior acetabula. No erosive change. IMPRESSION: Bony overgrowth along each lateral superior acetabulum. 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 No acute fracture or dislocation. No appreciable joint space narrowing or erosion. Electronically Signed   By: Bretta Bang III M.D.   On: 09/24/2015 15:56   I have personally reviewed and evaluated these images as part of my medical decision-making.   EKG Interpretation None      MDM   Final diagnoses:  Fall, initial encounter  Shoulder pain, acute, right  Hip pain, right    BP 106/82 mmHg  Pulse 78  Temp(Src) 98 F  (36.7 C) (Oral)  Resp 17  SpO2 95%   I personally performed the services described in this documentation, which was scribed in my presence. The recorded information has been reviewed and is accurate.      Fayrene Helper, PA-C 09/24/15 1702  Melene Plan, DO 09/24/15 1930

## 2015-11-11 ENCOUNTER — Ambulatory Visit: Payer: Medicaid Other | Admitting: Neurology

## 2015-11-12 ENCOUNTER — Encounter: Payer: Self-pay | Admitting: Neurology

## 2015-12-14 ENCOUNTER — Emergency Department (HOSPITAL_COMMUNITY): Payer: Medicaid Other

## 2015-12-14 ENCOUNTER — Observation Stay (HOSPITAL_COMMUNITY)
Admission: EM | Admit: 2015-12-14 | Discharge: 2015-12-15 | Disposition: A | Discharge status: Home / Self Care | Payer: Medicaid Other | Attending: Family Medicine | Admitting: Family Medicine

## 2015-12-14 ENCOUNTER — Encounter (HOSPITAL_COMMUNITY): Payer: Self-pay | Admitting: *Deleted

## 2015-12-14 DIAGNOSIS — R072 Precordial pain: Principal | ICD-10-CM | POA: Insufficient documentation

## 2015-12-14 DIAGNOSIS — F329 Major depressive disorder, single episode, unspecified: Secondary | ICD-10-CM | POA: Diagnosis not present

## 2015-12-14 DIAGNOSIS — F17210 Nicotine dependence, cigarettes, uncomplicated: Secondary | ICD-10-CM | POA: Diagnosis not present

## 2015-12-14 DIAGNOSIS — Z7984 Long term (current) use of oral hypoglycemic drugs: Secondary | ICD-10-CM | POA: Diagnosis not present

## 2015-12-14 DIAGNOSIS — E119 Type 2 diabetes mellitus without complications: Secondary | ICD-10-CM | POA: Insufficient documentation

## 2015-12-14 DIAGNOSIS — Z8673 Personal history of transient ischemic attack (TIA), and cerebral infarction without residual deficits: Secondary | ICD-10-CM | POA: Diagnosis not present

## 2015-12-14 DIAGNOSIS — Z794 Long term (current) use of insulin: Secondary | ICD-10-CM

## 2015-12-14 DIAGNOSIS — Z7982 Long term (current) use of aspirin: Secondary | ICD-10-CM | POA: Diagnosis not present

## 2015-12-14 DIAGNOSIS — E1140 Type 2 diabetes mellitus with diabetic neuropathy, unspecified: Secondary | ICD-10-CM | POA: Insufficient documentation

## 2015-12-14 DIAGNOSIS — J45909 Unspecified asthma, uncomplicated: Secondary | ICD-10-CM | POA: Diagnosis not present

## 2015-12-14 DIAGNOSIS — E785 Hyperlipidemia, unspecified: Secondary | ICD-10-CM | POA: Insufficient documentation

## 2015-12-14 DIAGNOSIS — F17200 Nicotine dependence, unspecified, uncomplicated: Secondary | ICD-10-CM | POA: Insufficient documentation

## 2015-12-14 DIAGNOSIS — R079 Chest pain, unspecified: Secondary | ICD-10-CM | POA: Diagnosis present

## 2015-12-14 HISTORY — DX: Supraventricular tachycardia: I47.1

## 2015-12-14 LAB — URINALYSIS, ROUTINE W REFLEX MICROSCOPIC
BILIRUBIN URINE: NEGATIVE
Glucose, UA: >1000 mg/dL — AB
Hgb urine dipstick: NEGATIVE
KETONES UR: NEGATIVE mg/dL
LEUKOCYTES UA: NEGATIVE
NITRITE: NEGATIVE
PROTEIN: NEGATIVE mg/dL
Specific Gravity, Urine: 1.026 (ref 1.005–1.030)
pH: 5.5 (ref 5.0–8.0)

## 2015-12-14 LAB — RAPID URINE DRUG SCREEN, HOSP PERFORMED
Amphetamines: NOT DETECTED
BENZODIAZEPINES: NOT DETECTED
Barbiturates: NOT DETECTED
COCAINE: NOT DETECTED
OPIATES: NOT DETECTED
Tetrahydrocannabinol: NOT DETECTED

## 2015-12-14 LAB — COMPREHENSIVE METABOLIC PANEL
ALT: 43 U/L (ref 17–63)
ANION GAP: 11 (ref 5–15)
AST: 29 U/L (ref 15–41)
Albumin: 3.6 g/dL (ref 3.5–5.0)
Alkaline Phosphatase: 99 U/L (ref 38–126)
BUN: 9 mg/dL (ref 6–20)
CHLORIDE: 102 mmol/L (ref 101–111)
CO2: 23 mmol/L (ref 22–32)
CREATININE: 0.73 mg/dL (ref 0.61–1.24)
Calcium: 8.6 mg/dL — ABNORMAL LOW (ref 8.9–10.3)
Glucose, Bld: 261 mg/dL — ABNORMAL HIGH (ref 65–99)
POTASSIUM: 3.4 mmol/L — AB (ref 3.5–5.1)
SODIUM: 136 mmol/L (ref 135–145)
Total Bilirubin: 0.4 mg/dL (ref 0.3–1.2)
Total Protein: 6 g/dL — ABNORMAL LOW (ref 6.5–8.1)

## 2015-12-14 LAB — PROTIME-INR
INR: 0.94 (ref 0.00–1.49)
PROTHROMBIN TIME: 12.8 s (ref 11.6–15.2)

## 2015-12-14 LAB — CBC
HCT: 41.5 % (ref 39.0–52.0)
Hemoglobin: 14.4 g/dL (ref 13.0–17.0)
MCH: 31.7 pg (ref 26.0–34.0)
MCHC: 34.7 g/dL (ref 30.0–36.0)
MCV: 91.4 fL (ref 78.0–100.0)
PLATELETS: 103 10*3/uL — AB (ref 150–400)
RBC: 4.54 MIL/uL (ref 4.22–5.81)
RDW: 12.5 % (ref 11.5–15.5)
WBC: 6.7 10*3/uL (ref 4.0–10.5)

## 2015-12-14 LAB — URINE MICROSCOPIC-ADD ON
RBC / HPF: NONE SEEN RBC/hpf (ref 0–5)
Squamous Epithelial / LPF: NONE SEEN
WBC, UA: NONE SEEN WBC/hpf (ref 0–5)

## 2015-12-14 LAB — GLUCOSE, CAPILLARY: Glucose-Capillary: 185 mg/dL — ABNORMAL HIGH (ref 65–99)

## 2015-12-14 LAB — TROPONIN I

## 2015-12-14 MED ORDER — ENOXAPARIN SODIUM 40 MG/0.4ML ~~LOC~~ SOLN
40.0000 mg | SUBCUTANEOUS | Status: DC
  Administered 2015-12-14: 40 mg via SUBCUTANEOUS
  Filled 2015-12-14: qty 0.4

## 2015-12-14 MED ORDER — ONDANSETRON HCL 4 MG/2ML IJ SOLN
4.0000 mg | Freq: Once | INTRAMUSCULAR | Status: AC
  Administered 2015-12-14: 4 mg via INTRAVENOUS
  Filled 2015-12-14: qty 2

## 2015-12-14 MED ORDER — ACETAMINOPHEN 325 MG PO TABS: 650.0000 mg | ORAL_TABLET | ORAL | Status: DC | PRN

## 2015-12-14 MED ORDER — ALBUTEROL SULFATE (2.5 MG/3ML) 0.083% IN NEBU: 2.5000 mg | INHALATION_SOLUTION | Freq: Four times a day (QID) | RESPIRATORY_TRACT | Status: DC | PRN

## 2015-12-14 MED ORDER — ATORVASTATIN CALCIUM 40 MG PO TABS
40.0000 mg | ORAL_TABLET | Freq: Every day | ORAL | Status: DC
  Administered 2015-12-14: 40 mg via ORAL
  Filled 2015-12-14: qty 1

## 2015-12-14 MED ORDER — POTASSIUM CHLORIDE CRYS ER 20 MEQ PO TBCR
20.0000 meq | EXTENDED_RELEASE_TABLET | Freq: Two times a day (BID) | ORAL | Status: DC
Start: 2015-12-14 — End: 2015-12-14

## 2015-12-14 MED ORDER — SODIUM CHLORIDE 0.9 % IV SOLN
INTRAVENOUS | Status: DC
  Administered 2015-12-14: 18:00:00 via INTRAVENOUS

## 2015-12-14 MED ORDER — PREGABALIN 50 MG PO CAPS
150.0000 mg | ORAL_CAPSULE | Freq: Two times a day (BID) | ORAL | Status: DC
  Administered 2015-12-14 – 2015-12-15 (×2): 150 mg via ORAL
  Filled 2015-12-14 (×2): qty 3

## 2015-12-14 MED ORDER — GI COCKTAIL ~~LOC~~: 30.0000 mL | Freq: Four times a day (QID) | ORAL | Status: DC | PRN

## 2015-12-14 MED ORDER — MORPHINE SULFATE (PF) 4 MG/ML IV SOLN
4.0000 mg | Freq: Once | INTRAVENOUS | Status: AC
Start: 2015-12-14 — End: 2015-12-14
  Administered 2015-12-14: 4 mg via INTRAVENOUS
  Filled 2015-12-14: qty 1

## 2015-12-14 MED ORDER — LEVETIRACETAM 500 MG PO TABS
1000.0000 mg | ORAL_TABLET | Freq: Two times a day (BID) | ORAL | Status: DC
  Administered 2015-12-14 – 2015-12-15 (×2): 1000 mg via ORAL
  Filled 2015-12-14 (×2): qty 2

## 2015-12-14 MED ORDER — INSULIN ASPART 100 UNIT/ML ~~LOC~~ SOLN
0.0000 [IU] | Freq: Three times a day (TID) | SUBCUTANEOUS | Status: DC
  Administered 2015-12-15: 3 [IU] via SUBCUTANEOUS

## 2015-12-14 MED ORDER — ASPIRIN EC 325 MG PO TBEC
325.0000 mg | DELAYED_RELEASE_TABLET | Freq: Every day | ORAL | Status: DC
  Administered 2015-12-15: 325 mg via ORAL
  Filled 2015-12-14: qty 1

## 2015-12-14 MED ORDER — MORPHINE SULFATE (PF) 2 MG/ML IV SOLN
2.0000 mg | INTRAVENOUS | Status: DC | PRN
  Administered 2015-12-14: 2 mg via INTRAVENOUS
  Filled 2015-12-14 (×2): qty 1

## 2015-12-14 MED ORDER — INSULIN GLARGINE 100 UNIT/ML ~~LOC~~ SOLN
10.0000 [IU] | Freq: Every day | SUBCUTANEOUS | Status: DC
  Administered 2015-12-14: 10 [IU] via SUBCUTANEOUS
  Filled 2015-12-14 (×2): qty 0.1

## 2015-12-14 MED ORDER — METOPROLOL SUCCINATE ER 25 MG PO TB24
25.0000 mg | ORAL_TABLET | Freq: Every day | ORAL | Status: DC
  Administered 2015-12-15: 25 mg via ORAL
  Filled 2015-12-14: qty 1

## 2015-12-14 MED ORDER — POTASSIUM CHLORIDE CRYS ER 20 MEQ PO TBCR
40.0000 meq | EXTENDED_RELEASE_TABLET | Freq: Once | ORAL | Status: AC
  Administered 2015-12-14: 40 meq via ORAL
  Filled 2015-12-14: qty 2

## 2015-12-14 MED ORDER — FLUOXETINE HCL 20 MG PO CAPS
40.0000 mg | ORAL_CAPSULE | Freq: Every day | ORAL | Status: DC
  Administered 2015-12-15: 40 mg via ORAL
  Filled 2015-12-14: qty 4

## 2015-12-14 MED ORDER — INSULIN ASPART 100 UNIT/ML ~~LOC~~ SOLN: 0.0000 [IU] | Freq: Every day | SUBCUTANEOUS | Status: DC

## 2015-12-14 MED ORDER — ONDANSETRON HCL 4 MG/2ML IJ SOLN: 4.0000 mg | Freq: Four times a day (QID) | INTRAMUSCULAR | Status: DC | PRN

## 2015-12-14 MED ORDER — NITROGLYCERIN 0.4 MG SL SUBL
0.4000 mg | SUBLINGUAL_TABLET | SUBLINGUAL | Status: DC | PRN
  Filled 2015-12-14: qty 1

## 2015-12-14 MED ORDER — ALBUTEROL SULFATE HFA 108 (90 BASE) MCG/ACT IN AERS: 2.0000 | INHALATION_SPRAY | Freq: Four times a day (QID) | RESPIRATORY_TRACT | Status: DC | PRN

## 2015-12-14 NOTE — ED Provider Notes (Signed)
CSN: [id number]     Arrival date & time 12/14/15  1747 History   First MD Initiated Contact with Patient 12/14/15 1749     Chief Complaint  Patient presents with  . Chest Pain  PT PRESENTS VIA EMS WITH CP.  THE PT SAID THAT IT STARTED AROUND 1630.  EMS GAVE PT ASA AND 1 NITRO.  THE NITRO DID NOT HELP PAIN.  PT'S LAST STRESS TEST WAS 01/27/14 AND WAS NL.    (Consider location/radiation/quality/duration/timing/severity/associated sxs/prior Treatment) Patient is a 45 y.o. male presenting with chest pain. The history is provided by the patient.  Chest Pain Pain location:  Substernal area Pain quality: pressure   Pain radiates to:  L arm Pain radiates to the back: no   Pain severity:  Moderate Onset quality:  Sudden Timing:  Constant Progression:  Unchanged Chronicity:  New Relieved by:  Nothing Ineffective treatments:  Nitroglycerin and aspirin   Past Medical History  Diagnosis Date  . Diabetes mellitus without complication (HCC)   . Neuropathy (HCC)   . Asthma   . Vision loss of left eye   . Blindness     bilateral  . Hypercholesteremia   . Depression   . Anxiety   . Stroke [facility]) 02/2014    right sided weakness/numbness   Past Surgical History  Procedure Laterality Date  . Hernia repair    . Eye surgery Left   . Abdominal surgery     Family History  Problem Relation Age of Onset  . High blood pressure Mother   . Diabetes Mellitus I Mother   . High blood pressure Sister   . High blood pressure Paternal Grandfather   . High blood pressure Father   . Diabetes Mellitus I Father   . Migraines Daughter   . Parkinsonism Daughter   . Parkinsonism Son    Social History  Substance Use Topics  . Smoking status: Current Every Day Smoker -- 0.00 packs/day    Types: Cigarettes, E-cigarettes  . Smokeless tobacco: Never Used     Comment: he does electonic cigarettes sometmes too  . Alcohol Use: 0.0 oz/week    0 Standard drinks or equivalent per week    Review of Systems   Cardiovascular: Positive for chest pain.  All other systems reviewed and are negative.     Allergies  Penicillins and Toradol  Home Medications   Prior to Admission medications   Medication Sig Start Date End Date Taking? Authorizing Provider  albuterol (PROVENTIL HFA;VENTOLIN HFA) 108 (90 BASE) MCG/ACT inhaler Inhale 2 puffs into the lungs every 6 (six) hours as needed for wheezing or shortness of breath.    Historical Provider, MD  aspirin 325 MG tablet Take 1 tablet (325 mg total) by mouth daily. 05/21/14   Joseph Art, DO  atorvastatin (LIPITOR) 40 MG tablet Take 40 mg by mouth at bedtime.     Historical Provider, MD  FLUoxetine (PROZAC) 40 MG capsule Take 40 mg by mouth daily.    Historical Provider, MD  fluticasone (FLONASE) 50 MCG/ACT nasal spray Place 1 spray into both nostrils daily as needed for allergies or rhinitis.    Historical Provider, MD  glipiZIDE (GLUCOTROL XL) 10 MG 24 hr tablet Take 10 mg by mouth daily with breakfast.    Historical Provider, MD  HYDROcodone-acetaminophen (NORCO/VICODIN) 5-325 MG tablet Take 1 tablet by mouth every 6 (six) hours as needed for moderate pain. 09/24/15   Fayrene Helper, PA-C  ibuprofen (ADVIL,MOTRIN) 600 MG tablet Take 600  mg by mouth every 6 (six) hours as needed for moderate pain.    Historical Provider, MD  levETIRAcetam (KEPPRA) 1000 MG tablet Take 1 tablet (1,000 mg total) by mouth 2 (two) times daily. 02/04/15   Lavera Guiseana Duo Liu, MD  metFORMIN (GLUCOPHAGE-XR) 500 MG 24 hr tablet Take 1,000 mg by mouth 2 (two) times daily.    Historical Provider, MD  metoprolol succinate (TOPROL XL) 25 MG 24 hr tablet Take 1 tablet (25 mg total) by mouth daily. 08/18/15   Cassell Clementhomas Brackbill, MD  Multiple Vitamins-Minerals (MULTIVITAMIN WITH MINERALS) tablet Take 1 tablet by mouth daily.    Historical Provider, MD  pregabalin (LYRICA) 150 MG capsule Take 1 capsule (150 mg total) by mouth 2 (two) times daily. 12/19/14   Vassie Lollarlos Madera, MD   BP 130/85 mmHg  Pulse  99  Temp(Src) 98.4 F (36.9 C) (Oral)  Resp 27  Ht 5\' 8"  (1.727 m)  Wt 204 lb (92.534 kg)  BMI 31.03 kg/m2  SpO2 95% Physical Exam  Constitutional: He is oriented to person, place, and time. He appears well-developed and well-nourished.  HENT:  Head: Normocephalic and atraumatic.  Right Ear: External ear normal.  Left Ear: External ear normal.  Nose: Nose normal.  Mouth/Throat: Oropharynx is clear and moist.  Eyes: Conjunctivae and EOM are normal. Pupils are equal, round, and reactive to light.  Neck: Normal range of motion. Neck supple.  Cardiovascular: Normal rate, regular rhythm, normal heart sounds and intact distal pulses.   Pulmonary/Chest: Effort normal and breath sounds normal.  Abdominal: Soft. Bowel sounds are normal.  Musculoskeletal: Normal range of motion.  Neurological: He is alert and oriented to person, place, and time.  Skin: Skin is warm and dry.  Psychiatric: He has a normal mood and affect. His behavior is normal. Judgment and thought content normal.  Nursing note and vitals reviewed.   ED Course  Procedures (including critical care time) Labs Review Labs Reviewed  CBC - Abnormal; Notable for the following:    Platelets 103 (*)    All other components within normal limits  COMPREHENSIVE METABOLIC PANEL - Abnormal; Notable for the following:    Potassium 3.4 (*)    Glucose, Bld 261 (*)    Calcium 8.6 (*)    Total Protein 6.0 (*)    All other components within normal limits  PROTIME-INR  TROPONIN I  URINE RAPID DRUG SCREEN, HOSP PERFORMED  TROPONIN I  TROPONIN I  URINALYSIS, ROUTINE W REFLEX MICROSCOPIC (NOT AT [facility])    Imaging Review Dg Chest 2 View  12/14/2015  CLINICAL DATA:  Mid sternal chest pain radiating to neck and left arm starting this afternoon. Shortness of breath. Diabetes. EXAM: CHEST  2 VIEW COMPARISON:  06/24/2015 FINDINGS: Low lung volumes are present, causing crowding of the pulmonary vasculature. Faint linear opacities in the  left mid lung, potentially from subsegmental atelectasis. Thoracic spondylosis. No confluent pneumonia. No pleural effusion. Heart size within normal limits. IMPRESSION: 1. Low lung volumes, potentially with some faint subsegmental atelectasis in the left mid lung. Electronically Signed   By: Gaylyn RongWalter  Liebkemann M.D.   On: 12/14/2015 18:20   I have personally reviewed and evaluated these images and lab results as part of my medical decision-making.   EKG Interpretation   Date/Time:  Sunday December 14 2015 17:48:03 EDT Ventricular Rate:  97 PR Interval:  124 QRS Duration: 83 QT Interval:  360 QTC Calculation: 457 R Axis:   45 Text Interpretation:  Sinus rhythm Borderline T  abnormalities, inferior  leads Confirmed by [facility] MD, Jalisha Enneking ([id number]) on 12/14/2015 5:58:14 PM      MDM  CP HAS IMPROVED WITH ADDN NITRO HERE.  PT HAS MULTIPLE RISK FACTORS FOR CAD.  LAST STRESS 2 YEARS AGO.  PT WAS D/W DR. MAYO (FP RESIDENT) ON FOR UNASSIGNED.  SHE WILL ADMIT PT FOR OBS. Final diagnoses:  Chest pain, unspecified chest pain type        Jacalyn Lefevre, MD 12/14/15 1944

## 2015-12-14 NOTE — ED Notes (Signed)
MD notified of pt pain. MD states pt good for tele, no new orders.

## 2015-12-14 NOTE — ED Notes (Signed)
The pt is c/o chest pain since 1630  He has had tachycardia inh the past  No cardiac history.  The pt keeps both eyes closed and does not open then when  Answering questions iv per [facility] ems

## 2015-12-14 NOTE — H&P (Signed)
Family Medicine Teaching [facility] History and Physical Service Pager: [phone]  Patient name: Miguel CoeGeorge L Jacinto Jr. Medical record number: [id number] Date of birth: Apr 10, 1971 Age: 45 y.o. Gender: male  Primary Care Provider: Ross LudwigICALE, REBECCA K, NP Consultants: Cardiology Code Status: Full  Chief Complaint: Chest pain  Assessment and Plan: Miguel CoeGeorge L Lacey Jr. is a 45 y.o. male presenting with chest pain . PMH is significant for T2DM, hx CVA in 2015, HLD, blind in left eye, hx seizures.  Chest Pain, ACS rule out: Concern for ACS given his sharp, stabbing left sided chest pain that radiates to the left arm and left side of the neck. Heart score is 2. Troponin negative x 1, EKG with new slight ST depressions in the inferior leads, V5, and V6. CXR without acute abnormalities. Pt had a stress test in 2015 which was normal. Last ECHO in 2015 with EF 55-60%, normal wall motion. Musculoskeletal etiology is most likely, given that his chest pain is reproducible on palpation of the sternum. GERD is also a possibility. - Admit to telemetry under observation status, attending Dr. Jennette KettleNeal - Cardiology consult in the AM - Trend troponins - Repeat AM EKG - Continue ASA 325mg  daily - Morphine q2hrs prn for pain, Zofran for nausea - GI cocktail prn - Cardiac monitoring - Vitals per unit routine - risk strat labs: TSH, A1c, lipid panel  T2DM: Last A1c 12/2014: 10.2%. Takes Lantus 20 units qhs at home. - Lantus 10 units qhs + moderate SSI - CBGs with meals and at bedtime - continue home Lyrica 150mg bid  Hypokalemia: K 3.4 on admission. - K-Dur 40mEq - f/u AM BMET  Hx Sinus Tachycardia: HR controlled in the 80s - Continue Metoprolol succinate 25mg  daily  Hx seizure disorder: [facility] - Continue home Keppra 1,000mg  bid  Hx CVA: in 2015. Now with residual right sided weakness per patient, but he has 5/5 strength on exam. - Continue ASA 325mg .  HLD:  Last lipid panel 05/2014 with LDL 55 - Continue home Lipitor 40mg  daily  Asthma: Normal work of breathing on exam. Stable on room air. - Continue home Albuterol.  FEN/GI: Heart healthy diet, SLIV Prophylaxis: Lovenox  Disposition: Admit to telemetry under observation status, attending Dr. Jennette KettleNeal.  History of Present Illness:  Miguel CoeGeorge L Miguel Jr. is a 45 y.o. male presenting with constant chest pain that started 4 hours prior. He was just sitting on the couch when the chest pain started. The chest pain is "sharp" and "stabbing". The pain is located on the left side of his chest, with radiation to the left arm and left side of the neck. The pain is worse with a deep breath. Nothing makes the chest pain better. The pain was associated with this left arm feeling "clammy" and mild shortness of breath. He states that he has shortness of breath at baseline. He did not have any nausea at the time. He has had this chest pain before, several years ago at [facility]. He cannot remember what they diagnosed him with. He has a history of a stroke, so he has right sided weakness and headaches at baseline. He is also blind in his left eye. He endorses a headache this morning. He has a history of SVT and endorses occasional palpitations.  In the ED, he was hemodynamically stable. Troponin was <0.03. EKG showed slight ST depression in the inferior leads, V5, and V6.   Review Of Systems: Per HPI with the following additions: No  vomiting, no diarrhea, no abdominal pain, no changes in vision, no lower extremity edema, no orthopnea, no cough, no rhinorrhea. +PND. Otherwise the remainder of the systems were negative.  Patient Active Problem List   Diagnosis Date Noted  . Episode of altered consciousness 01/15/2015  . Encephalopathy acute 12/18/2014  . Altered mental status   . LOC (loss of consciousness)   . 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 Encephalopathy 08/01/2014  .  Migraine 07/31/2014  . 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 Orthostatic hypotension 06/03/2014  . 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 Cerebral thrombosis with cerebral infarction (HCC) 05/20/2014  . Right sided weakness 05/19/2014  . Stroke (HCC)   . DM type 2 with diabetic peripheral neuropathy (HCC) 04/09/2014  . 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 Essential hypertension, benign 03/27/2014  . HLD (hyperlipidemia) 03/27/2014  . Type II or unspecified type diabetes mellitus with peripheral circulatory disorders, uncontrolled(250.72) 02/12/2014  . Tobacco abuse 02/12/2014  . CVA (cerebral infarction) 02/11/2014  . Syncope 01/25/2014  . Hip pain 01/25/2014  . SAH (subarachnoid hemorrhage) (HCC) 01/25/2014  . Vision, loss, sudden 01/25/2014  . Type II or unspecified type diabetes mellitus without mention of complication, uncontrolled 01/25/2014    Past Medical History: Past Medical History  Diagnosis Date  . Diabetes mellitus without complication (HCC)   . Neuropathy (HCC)   . Asthma   . Vision loss of left eye   . Blindness     bilateral  . Hypercholesteremia   . Depression   . Anxiety   . Stroke [facility]) 02/2014    right sided weakness/numbness    Past Surgical History: Past Surgical History  Procedure Laterality Date  . Hernia repair    . Eye surgery Left   . Abdominal surgery      Social History: Social History  Substance Use Topics  . Smoking status: Current Every Day Smoker -- 0.00 packs/day    Types: Cigarettes, E-cigarettes  . Smokeless tobacco: Never Used     Comment: he does electonic cigarettes sometmes too  . Alcohol Use: 0.0 oz/week    0 Standard drinks or equivalent per week   Additional social history: None Please also refer to relevant sections of EMR.  Family History: Family History  Problem Relation Age of Onset  . High blood pressure Mother   . Diabetes  Mellitus I Mother   . High blood pressure Sister   . High blood pressure Paternal Grandfather   . High blood pressure Father   . Diabetes Mellitus I Father   . Migraines Daughter   . Parkinsonism Daughter   . Parkinsonism Son     Allergies and Medications: Allergies  Allergen Reactions  . Penicillins Other (See Comments)    Unknown childhood reaction  . Toradol [Ketorolac Tromethamine] Hives   No current facility-administered medications on file prior to encounter.   Current Outpatient Prescriptions on File Prior to Encounter  Medication Sig Dispense Refill  . albuterol (PROVENTIL HFA;VENTOLIN HFA) 108 (90 BASE) MCG/ACT inhaler Inhale 2 puffs into the lungs every 6 (six) hours as needed for wheezing or shortness of breath.    Marland Kitchen aspirin 325 MG tablet Take 1 tablet (325 mg total) by mouth daily. 30 tablet 0  . atorvastatin (LIPITOR) 40 MG tablet Take 40 mg by mouth at bedtime.     Marland Kitchen FLUoxetine (PROZAC) 40 MG capsule Take 40 mg by mouth daily.    Marland Kitchen  fluticasone (FLONASE) 50 MCG/ACT nasal spray Place 1 spray into both nostrils daily as needed for allergies or rhinitis.    Marland Kitchen glipiZIDE (GLUCOTROL XL) 10 MG 24 hr tablet Take 10 mg by mouth daily with breakfast.    . HYDROcodone-acetaminophen (NORCO/VICODIN) 5-325 MG tablet Take 1 tablet by mouth every 6 (six) hours as needed for moderate pain. 12 tablet 0  . ibuprofen (ADVIL,MOTRIN) 600 MG tablet Take 600 mg by mouth every 6 (six) hours as needed for moderate pain.    Marland Kitchen levETIRAcetam (KEPPRA) 1000 MG tablet Take 1 tablet (1,000 mg total) by mouth 2 (two) times daily. 60 tablet 0  . metFORMIN (GLUCOPHAGE-XR) 500 MG 24 hr tablet Take 1,000 mg by mouth 2 (two) times daily.    . metoprolol succinate (TOPROL XL) 25 MG 24 hr tablet Take 1 tablet (25 mg total) by mouth daily. 90 tablet 2  . Multiple Vitamins-Minerals (MULTIVITAMIN WITH MINERALS) tablet Take 1 tablet by mouth daily.    . pregabalin (LYRICA) 150 MG capsule Take 1 capsule (150 mg  total) by mouth 2 (two) times daily. 60 capsule 1    Objective: BP 130/85 mmHg  Pulse 99  Temp(Src) 98.4 F (36.9 C) (Oral)  Resp 27  Ht 5\' 8"  (1.727 m)  Wt 204 lb (92.534 kg)  BMI 31.03 kg/m2  SpO2 95% Exam: General: Tired-appearing male, laying in bed, in NAD Eyes: PERRLA, EOMI, no scleral icterus ENTM: Nose normal, oropharynx clear, dry mucous membranes Neck: Supple, no adenopathy, no JVD Cardiovascular: RRR, no murmurs, sternum and L costochondral joints tender to palpation, 2+ DP pulses bilaterally Respiratory: Mild bibasilar crackles present, normal work of breathing Abdomen: +BS, soft, non-tender, non-distended MSK: No lower extremity edema, normal tone Skin: No rashes or lesions Neuro: Awake, alert, oriented, CN 2-12 grossly intact, 5/5 muscle strength in the upper and lower extremities. Psych: Appropriate affect, normal behavior.  Labs and Imaging: CBC BMET   Recent Labs Lab 12/14/15 1845  WBC 6.7  HGB 14.4  HCT 41.5  PLT 103*    Recent Labs Lab 12/14/15 1845  NA 136  K 3.4*  CL 102  CO2 23  BUN 9  CREATININE 0.73  GLUCOSE 261*  CALCIUM 8.6*     Troponin <0.03 UDS neg UA: >1000 glucose, negative leukocytes, negative nitrites  EKG: slight ST depression in the inferior leads, V5, and V6 CXR: Lung lung volumes, faint subsegmental atelectasis in the left mid lung.  Campbell Stall, MD 12/14/2015, 7:47 PM PGY-1, [facility] FPTS Intern pager: [phone], text pages welcome  Upper Level Addendum:  I have seen and evaluated this patient along with Dr. Nancy Marus and reviewed the above note, making necessary revisions in pink.  Erasmo Downer, MD, MPH PGY-2,  [facility] 12/14/2015 9:31 PM

## 2015-12-15 ENCOUNTER — Observation Stay (HOSPITAL_COMMUNITY): Payer: Medicaid Other

## 2015-12-15 ENCOUNTER — Encounter (HOSPITAL_COMMUNITY): Payer: Self-pay | Admitting: Physician Assistant

## 2015-12-15 DIAGNOSIS — E119 Type 2 diabetes mellitus without complications: Secondary | ICD-10-CM | POA: Diagnosis not present

## 2015-12-15 DIAGNOSIS — E1140 Type 2 diabetes mellitus with diabetic neuropathy, unspecified: Secondary | ICD-10-CM | POA: Diagnosis not present

## 2015-12-15 DIAGNOSIS — J45909 Unspecified asthma, uncomplicated: Secondary | ICD-10-CM | POA: Diagnosis not present

## 2015-12-15 DIAGNOSIS — R072 Precordial pain: Secondary | ICD-10-CM | POA: Diagnosis not present

## 2015-12-15 DIAGNOSIS — F329 Major depressive disorder, single episode, unspecified: Secondary | ICD-10-CM | POA: Diagnosis not present

## 2015-12-15 DIAGNOSIS — F17200 Nicotine dependence, unspecified, uncomplicated: Secondary | ICD-10-CM | POA: Diagnosis not present

## 2015-12-15 DIAGNOSIS — R079 Chest pain, unspecified: Secondary | ICD-10-CM | POA: Diagnosis not present

## 2015-12-15 DIAGNOSIS — E785 Hyperlipidemia, unspecified: Secondary | ICD-10-CM | POA: Diagnosis not present

## 2015-12-15 DIAGNOSIS — Z794 Long term (current) use of insulin: Secondary | ICD-10-CM

## 2015-12-15 DIAGNOSIS — R0789 Other chest pain: Secondary | ICD-10-CM | POA: Diagnosis not present

## 2015-12-15 LAB — BASIC METABOLIC PANEL
ANION GAP: 10 (ref 5–15)
BUN: 9 mg/dL (ref 6–20)
CHLORIDE: 102 mmol/L (ref 101–111)
CO2: 23 mmol/L (ref 22–32)
CREATININE: 0.74 mg/dL (ref 0.61–1.24)
Calcium: 7.9 mg/dL — ABNORMAL LOW (ref 8.9–10.3)
GFR calc non Af Amer: >60 mL/min (ref >60)
Glucose, Bld: 247 mg/dL — ABNORMAL HIGH (ref 65–99)
POTASSIUM: 3.7 mmol/L (ref 3.5–5.1)
SODIUM: 135 mmol/L (ref 135–145)

## 2015-12-15 LAB — NM MYOCAR MULTI W/SPECT W/WALL MOTION / EF
CSEPEDS: 0 s
CSEPEW: 1 METS
CSEPHR: 65 %
CSEPPHR: 116 {beats}/min
Exercise duration (min): 0 min
MPHR: 178 {beats}/min
Rest HR: 69 {beats}/min

## 2015-12-15 LAB — TROPONIN I: Troponin I: <0.03 ng/mL (ref <0.031)

## 2015-12-15 LAB — GLUCOSE, CAPILLARY
Glucose-Capillary: 179 mg/dL — ABNORMAL HIGH (ref 65–99)
Glucose-Capillary: 177 mg/dL — ABNORMAL HIGH (ref 65–99)

## 2015-12-15 LAB — LIPID PANEL
Cholesterol: 189 mg/dL (ref 0–200)
HDL: 30 mg/dL — ABNORMAL LOW (ref >40)
LDL Cholesterol: 92 mg/dL (ref 0–99)
Total CHOL/HDL Ratio: 6.3 RATIO
Triglycerides: 334 mg/dL — ABNORMAL HIGH (ref <150)
VLDL: 67 mg/dL — ABNORMAL HIGH (ref 0–40)

## 2015-12-15 LAB — TSH: TSH: 1.213 u[IU]/mL (ref 0.350–4.500)

## 2015-12-15 MED ORDER — REGADENOSON 0.4 MG/5ML IV SOLN
INTRAVENOUS | Status: AC
  Filled 2015-12-15: qty 5

## 2015-12-15 MED ORDER — REGADENOSON 0.4 MG/5ML IV SOLN
0.4000 mg | Freq: Once | INTRAVENOUS | Status: DC
  Filled 2015-12-15: qty 5

## 2015-12-15 MED ORDER — TECHNETIUM TC 99M TETROFOSMIN IV KIT
10.0000 | PACK | Freq: Once | INTRAVENOUS | Status: AC | PRN
Start: 2015-12-15 — End: 2015-12-15
  Administered 2015-12-15: 10 via INTRAVENOUS

## 2015-12-15 MED ORDER — REGADENOSON 0.4 MG/5ML IV SOLN
0.4000 mg | Freq: Once | INTRAVENOUS | Status: AC
  Administered 2015-12-15: 0.4 mg via INTRAVENOUS
  Filled 2015-12-15: qty 5

## 2015-12-15 MED ORDER — TECHNETIUM TC 99M TETROFOSMIN IV KIT
30.0000 | PACK | Freq: Once | INTRAVENOUS | Status: AC | PRN
  Administered 2015-12-15: 30 via INTRAVENOUS

## 2015-12-15 NOTE — Discharge Summary (Signed)
Family Medicine Teaching Service [facility] Discharge Summary  Patient name: Miguel Diaz. Medical record number: [id number] Date of birth: 1971/01/14 Age: 45 y.o. Gender: male Date of Admission: 12/14/2015  Date of Discharge: 12/15/15 Admitting Physician: Nestor Ramp, MD  Primary Care Provider: Ross Ludwig, NP Consultants: Cardiology  Indication for Hospitalization: Chest pain  Discharge Diagnoses/Problem List:  Type 2 DM Hx CVA Hx seizure disorder HLD Tobacco use  Disposition: Home  Discharge Condition: Stable, improved  Discharge Exam:  General: Tired-appearing male, laying in bed, in NAD, falling asleep intermittently. Eyes: PERRLA, EOMI, no scleral icterus ENTM: Nose normal, oropharynx clear, dry mucous membranes Neck: Supple, no adenopathy, no JVD Cardiovascular: RRR, no murmurs, sternum and L costochondral joints tender to palpation, 2+ DP pulses bilaterally Respiratory: Mild bibasilar crackles present, normal work of breathing Abdomen: +BS, soft, non-tender, non-distended MSK: No lower extremity edema, normal tone Skin: No rashes or lesions Neuro: Sleepy but arousable, oriented, CN 2-12 grossly intact, no focal deficits Psych: Appropriate affect, normal behavior.  Brief Hospital Course:  Miguel Diaz is a 45 year old male with a PMH of T2DM, HLD, hx CVA in 2015, hx seizure disorder, and tobacco abuse who presented to the ED with sharp, constant, left-sided chest pain that radiated to the left arm and left side of the neck. Heart score was 2. We thought his chest pain was likely musculoskeletal in etiology, as Pt's chest pain was reproducible on palpation of the sternum. Given his history, he was admitted for ACS rule out. Troponins were negative x 2. EKG on admission showed new slight ST depression in the inferior leads, V5 and V6 and TWI in V4 and V5. Repeat EKG on day 2 of hospitalization showed improvement of the ST depressions and TWIs. Cardiology was  consulted and recommended myoview, which showed low risk. We also obtained risk stratification labs. TSH was normal, LDL 92, HgbA1c pending.  The remainder of his chronic medical conditions were stable throughout hospitalization and no changes were made to his home medications.  Issues for Follow Up:  1. Continue to encourage smoking cessation as an outpatient.  Significant Procedures: Myoview (6/12): no ST elevation or depression, low risk  Significant Labs and Imaging:   Recent Labs Lab 12/14/15 1845  WBC 6.7  HGB 14.4  HCT 41.5  PLT 103*    Recent Labs Lab 12/14/15 1845 12/14/15 2355  NA 136 135  K 3.4* 3.7  CL 102 102  CO2 23 23  GLUCOSE 261* 247*  BUN 9 9  CREATININE 0.73 0.74  CALCIUM 8.6* 7.9*  ALKPHOS 99  --   AST 29  --   ALT 43  --   ALBUMIN 3.6  --    Troponin <0.03, <0.03 UDS neg UA: >1000 glucose, negative leukocytes, negative nitrites TSH 1.213 Lipid panel: chol 189, TG 334, HDL 30, LDL 92.  EKG (6/11): slight ST depression in the inferior leads, V5, and V6; T wave inversions in V4 and V5. Repeat EKG (6/12): improvement in ST depressions and T wave inversions. CXR: Lung lung volumes, faint subsegmental atelectasis in the left mid lung.  Results/Tests Pending at Time of Discharge: Hemoglobin A1c  Discharge Medications:    Medication List    TAKE these medications        albuterol 108 (90 Base) MCG/ACT inhaler  Commonly known as:  PROVENTIL HFA;VENTOLIN HFA  Inhale 2 puffs into the lungs every 6 (six) hours as needed for wheezing or shortness of breath.  aspirin 325 MG tablet  Take 1 tablet (325 mg total) by mouth daily.     atorvastatin 40 MG tablet  Commonly known as:  LIPITOR  Take 40 mg by mouth at bedtime.     celecoxib 200 MG capsule  Commonly known as:  CELEBREX  Take 200 mg by mouth daily.     FLUoxetine 40 MG capsule  Commonly known as:  PROZAC  Take 40 mg by mouth daily.     fluticasone 50 MCG/ACT nasal spray   Commonly known as:  FLONASE  Place 1 spray into both nostrils daily as needed for allergies or rhinitis.     glipiZIDE 10 MG 24 hr tablet  Commonly known as:  GLUCOTROL XL  Take 10 mg by mouth daily with breakfast.     HYDROcodone-acetaminophen 5-325 MG tablet  Commonly known as:  NORCO/VICODIN  Take 1 tablet by mouth every 6 (six) hours as needed for moderate pain.     ibuprofen 600 MG tablet  Commonly known as:  ADVIL,MOTRIN  Take 600 mg by mouth every 6 (six) hours as needed for moderate pain.     insulin glargine 100 UNIT/ML injection  Commonly known as:  LANTUS  Inject 20 Units into the skin at bedtime.     levETIRAcetam 1000 MG tablet  Commonly known as:  KEPPRA  Take 1 tablet (1,000 mg total) by mouth 2 (two) times daily.     metFORMIN 500 MG 24 hr tablet  Commonly known as:  GLUCOPHAGE-XR  Take 1,000 mg by mouth 2 (two) times daily.     metoprolol succinate 25 MG 24 hr tablet  Commonly known as:  TOPROL XL  Take 1 tablet (25 mg total) by mouth daily.     multivitamin with minerals tablet  Take 1 tablet by mouth daily.     pregabalin 150 MG capsule  Commonly known as:  LYRICA  Take 1 capsule (150 mg total) by mouth 2 (two) times daily.        Discharge Instructions: Please refer to Patient Instructions section of EMR for full details.  Patient was counseled important signs and symptoms that should prompt return to medical care, changes in medications, dietary instructions, activity restrictions, and follow up appointments.   Follow-Up Appointments: Follow-up Information    Follow up with Tereso NewcomerScott Weaver, PA-C On 01/12/2016.   Specialties:  Cardiology, Physician Assistant   Why:  @9 :15 for post hospital   Contact information:   [address] [place] [address] [phone]       Campbell StallKaty Dodd Cadyn Fann, MD 12/15/2015, 3:54 PM PGY-1, [facility]

## 2015-12-15 NOTE — Consult Note (Signed)
CARDIOLOGY CONSULT NOTE   Patient ID: Miguel Diaz. MRN: [id number] DOB/AGE: 1970/10/08 45 y.o.  Admit date: 12/14/2015  Primary Physician   Ross Ludwig, NP Primary Cardiologist   Dr. Patty Sermons  Reason for Consultation   Chest pain  Requesting Physician  Dr. Jennette Kettle  HPI: [name]. is a 45 y.o. male with a history of DM, syncope, stroke, PSVT, HLD, left eye vision loss and ongoing tobacco abuse who admitted with CP.   Hx of syncope 01/2014 with undetermined etiology. Patient has had multiple admissions with recurrent falls as well as a CVA in August 2015 (SOCRATEs trial-aspirin vs ticagrelor). He had a cardiac monitor for 30 days in October 2015 that was normal.   Stress Myoview 01/27/14 no definite inducible ischemia with pharmacologic stress EF 67%. His most recent echocardiogram on 05/20/14 showed normal left ventricular systolic function with ejection fraction 55-60%. However showed severely dilated coronary sinus is consistent with persistent left superior vena cava syndrome (rarely a clinically significant abnormality unless the paient requires cardiac rhythm device). If necessary, this can be confiirmed using a left antecubital vein saline contrast study.  The patient was seen in the emergency room on 06/24/15 for supraventricular tachycardia at a heart rate of 220/m. He failed to respond to 6 mg of adenosine IV but did respond to 12 mg adenosine IV. He was doing well on cardiac stand point when seen last by Dr. Patty Sermons 08/18/15.   Patient was in usual state of health up until yesterday 6/11 when he had a sudden episode of left anterior chest pain. He described the pain as a "sharp and pressure ". It was associated with shortness of breath and radiated to his left arm and left neck. No diaphoresis or nausea vomiting. He describes the pain 5 out of 10 in character and lasted for about 2 or 3 hours and EMS was called. His pain did not improve with sublingual  nitroglycerin x 1. Deep breath make it worse. No recent heavy lifting, illness or travel. Still complains of mild chest discomfort. He said it is not very active. No chest discomfort or shortness of breath with any activity. The patient denies orthopnea, PND, syncope, lower extremity edema, abdominal pain, nausea, vomiting, fever, chills, blood in his stool or urine. Patient snores at night, daytime sleepiness and questionable strokes breathing while sleeping for a while. The patient was evaluated for sleep apnea many years ago however unable to complete study due to some reason.  Workup so far revealed troponin x 2 negative. Triglycerides 334, LDL 92, lytes normal this morning, TSH normal, UDS negative.  EKG showed normal sinus rhythm at rate of 97 bpm, TWI in lead III, aVF (appears old) , however new TWI in lead V4 and V5 which resolved this morning. Chest x-ray shows low lung volume, with faint left mid atelectasis, thoracic spondylolysis.  Hx of tobacco smoking for the past 30 years. Previously he was smoking 2 packs a day, currently 1 pack a day. Father had MI at age 24. Mother and sister has CHF.   Past Medical History  Diagnosis Date  . Diabetes mellitus without complication (HCC)   . Neuropathy (HCC)   . Asthma   . Vision loss of left eye   . Blindness     bilateral  . Hypercholesteremia   . Depression   . Anxiety   . Stroke (HCC) 02/2014    right sided weakness/numbness  . PSVT (paroxysmal supraventricular tachycardia) (HCC) 06/2015  Past Surgical History  Procedure Laterality Date  . Hernia repair    . Eye surgery Left   . Abdominal surgery      Allergies  Allergen Reactions  . Penicillins Other (See Comments)    Unknown childhood reaction  . Toradol [Ketorolac Tromethamine] Hives    I have reviewed the patient's current medications . aspirin EC  325 mg Oral Daily  . atorvastatin  40 mg Oral QHS  . enoxaparin (LOVENOX) injection  40 mg Subcutaneous Q24H  .  FLUoxetine  40 mg Oral Daily  . insulin aspart  0-15 Units Subcutaneous TID WC  . insulin aspart  0-5 Units Subcutaneous QHS  . insulin glargine  10 Units Subcutaneous QHS  . levETIRAcetam  1,000 mg Oral BID  . metoprolol succinate  25 mg Oral Daily  . pregabalin  150 mg Oral BID     acetaminophen, albuterol, gi cocktail, morphine injection, ondansetron (ZOFRAN) IV  Prior to Admission medications   Medication Sig Start Date End Date Taking? Authorizing Provider  albuterol (PROVENTIL HFA;VENTOLIN HFA) 108 (90 BASE) MCG/ACT inhaler Inhale 2 puffs into the lungs every 6 (six) hours as needed for wheezing or shortness of breath.    Historical Provider, MD  aspirin 325 MG tablet Take 1 tablet (325 mg total) by mouth daily. 05/21/14   Joseph Art, DO  atorvastatin (LIPITOR) 40 MG tablet Take 40 mg by mouth at bedtime.     Historical Provider, MD  FLUoxetine (PROZAC) 40 MG capsule Take 40 mg by mouth daily.    Historical Provider, MD  fluticasone (FLONASE) 50 MCG/ACT nasal spray Place 1 spray into both nostrils daily as needed for allergies or rhinitis.    Historical Provider, MD  glipiZIDE (GLUCOTROL XL) 10 MG 24 hr tablet Take 10 mg by mouth daily with breakfast.    Historical Provider, MD  HYDROcodone-acetaminophen (NORCO/VICODIN) 5-325 MG tablet Take 1 tablet by mouth every 6 (six) hours as needed for moderate pain. 09/24/15   Fayrene Helper, PA-C  ibuprofen (ADVIL,MOTRIN) 600 MG tablet Take 600 mg by mouth every 6 (six) hours as needed for moderate pain.    Historical Provider, MD  levETIRAcetam (KEPPRA) 1000 MG tablet Take 1 tablet (1,000 mg total) by mouth 2 (two) times daily. 02/04/15   Lavera Guise, MD  metFORMIN (GLUCOPHAGE-XR) 500 MG 24 hr tablet Take 1,000 mg by mouth 2 (two) times daily.    Historical Provider, MD  metoprolol succinate (TOPROL XL) 25 MG 24 hr tablet Take 1 tablet (25 mg total) by mouth daily. 08/18/15   Cassell Clement, MD  Multiple Vitamins-Minerals (MULTIVITAMIN WITH  MINERALS) tablet Take 1 tablet by mouth daily.    Historical Provider, MD  pregabalin (LYRICA) 150 MG capsule Take 1 capsule (150 mg total) by mouth 2 (two) times daily. 12/19/14   Vassie Loll, MD     Social History   Social History  . Marital Status: Significant Other    Spouse Name: N/A  . Number of Children: 2  . Years of Education: 9th   Occupational History  . disabled    Social History Main Topics  . Smoking status: Current Every Day Smoker -- 0.00 packs/day    Types: Cigarettes, E-cigarettes  . Smokeless tobacco: Never Used     Comment: he does electonic cigarettes sometmes too  . Alcohol Use: 0.0 oz/week    0 Standard drinks or equivalent per week  . Drug Use: No  . Sexual Activity: Not on file  Other Topics Concern  . Not on file   Social History Narrative   ** Merged History Encounter **   Patient  Lives with his girl friend   Patient is right handed   Patient coffee daily        Family Status  Relation Status Death Age  . Mother Deceased   . Father Alive   . Daughter Alive   . Son Alive    Family History  Problem Relation Age of Onset  . High blood pressure Mother   . Diabetes Mellitus I Mother   . High blood pressure Sister   . High blood pressure Paternal Grandfather   . High blood pressure Father   . Diabetes Mellitus I Father   . Migraines Daughter   . Parkinsonism Daughter   . Parkinsonism Son      ROS:  Full 14 point review of systems complete and found to be negative unless listed above.  Physical Exam: Blood pressure 112/75, pulse 76, temperature 98.5 F (36.9 C), temperature source Oral, resp. rate 18, height 5\' 8"  (1.727 m), weight 205 lb (92.987 kg), SpO2 97 %.  General: Well developed, well nourished, male in no acute distress Head: Eyes PERRLA, No xanthomas. Normocephalic and atraumatic, oropharynx without edema or exudate.  Lungs: Resp regular and unlabored, left faint rales Heart: RRR no s3, s4, or murmurs. Neck: No carotid  bruits. No lymphadenopathy.  No JVD. Abdomen: Bowel sounds present, abdomen soft and non-tender without masses or hernias noted. Msk:  No spine or cva tenderness. No weakness, no joint deformities or effusions. Extremities: No clubbing, cyanosis or edema. DP/PT/Radials 2+ and equal bilaterally. Neuro: Alert and oriented X 3. No focal deficits noted. Psych:  Good affect, responds appropriately Skin: No rashes or lesions noted.  Labs:   Lab Results  Component Value Date   WBC 6.7 12/14/2015   HGB 14.4 12/14/2015   HCT 41.5 12/14/2015   MCV 91.4 12/14/2015   PLT 103* 12/14/2015    Recent Labs  12/14/15 1845  INR 0.94     Recent Labs Lab 12/14/15 1845 12/14/15 2355  NA 136 135  K 3.4* 3.7  CL 102 102  CO2 23 23  BUN 9 9  CREATININE 0.73 0.74  CALCIUM 8.6* 7.9*  PROT 6.0*  --   BILITOT 0.4  --   ALKPHOS 99  --   ALT 43  --   AST 29  --   GLUCOSE 261* 247*  ALBUMIN 3.6  --    MAGNESIUM  Date Value Ref Range Status  02/04/2015 1.9 1.7 - 2.4 mg/dL Final    Recent Labs  16/04/9605/11/17 1845 12/14/15 2355  TROPONINI <0.03 <0.03   No results for input(s): TROPIPOC in the last 72 hours. No results found for: PROBNP Lab Results  Component Value Date   CHOL 189 12/14/2015   HDL 30* 12/14/2015   LDLCALC 92 12/14/2015   TRIG 334* 12/14/2015   No results found for: LIPASE, AMYLASE TSH  Date/Time Value Ref Range Status  12/14/2015 11:55 PM 1.213 0.350 - 4.500 uIU/mL Final  01/27/2014 03:17 AM 1.620 0.350 - 4.500 uIU/mL Final   No results found for: VITAMINB12, FOLATE, FERRITIN, TIBC, IRON, RETICCTPCT  Echo: 05/20/14 LV EF: 55% -  60%  ------------------------------------------------------------------- Indications:   TIA 435.9.  ------------------------------------------------------------------- History:  PMH: Right-sided weakness. Risk factors: Current tobacco use. Hypertension. Diabetes mellitus.  Dyslipidemia.  ------------------------------------------------------------------- Study Conclusions  - Left ventricle: The cavity size was normal. Systolic function was  normal. The estimated ejection fraction was in the range of 55% to 60%. Wall motion was normal; there were no regional wall motion abnormalities. Left ventricular diastolic function parameters were normal.  Recommendations: Severely dilated coronary sinus is consistent with persistent left superior vena cava syndrome (rarely a clinically significant abnormality unless the paient requires cardiac rhythm device). If necessary, this can be confiirmed using a left antecubital vein saline contrast study.  ECG:   EKG showed normal sinus rhythm at rate of 97 bpm, TWI in lead III, aVF (appears old) , however new TWI in lead V4 and V5 which resolved this morning.   Radiology:  Dg Chest 2 View  12/14/2015  CLINICAL DATA:  Mid sternal chest pain radiating to neck and left arm starting this afternoon. Shortness of breath. Diabetes. EXAM: CHEST  2 VIEW COMPARISON:  06/24/2015 FINDINGS: Low lung volumes are present, causing crowding of the pulmonary vasculature. Faint linear opacities in the left mid lung, potentially from subsegmental atelectasis. Thoracic spondylosis. No confluent pneumonia. No pleural effusion. Heart size within normal limits. IMPRESSION: 1. Low lung volumes, potentially with some faint subsegmental atelectasis in the left mid lung. Electronically Signed   By: Gaylyn Rong M.D.   On: 12/14/2015 18:20    ASSESSMENT AND PLAN:     1. Chest pain - Has both typical and atypical features. Pain did not improved on SL nitro however better today after IV morphine last night. EKG with new TWI in lead V4 and V5 at presentation which resolved this morning. Troponin x 2 negative. He has multiple cardiac risk factors including strong family of premature coronary artery disease, diabetes, ongoing tobacco abuse,  hyperlipidemia. Will discuss plan with MD, likely Myoview today.   2. PSVT - Denies palpitations. This pain is different. No further episode. Tele without any arrhythmias. Continue BB.   3. HLD - 12/14/2015: Cholesterol 189; HDL 30*; LDL Cholesterol 92; Triglycerides 334*; VLDL 67* - Continue lipitor 40mg . Consider adding tricor. Advice heart healy diet.   4. Tobacco abuse - Advised cessation. Education given.   5. DM - per primary  6. Snoring - Previously unable to complete sleep study. Wife noted he stopped breathing few times. Consider sleep study as outpatient.   7. Hx of CVA and seizure  8. Hypokalemia - Resolved.   SignedManson Passey, PA 12/15/2015, 7:39 AM Pager [phone]  Co-Sign MD   Attending Note:   The patient was seen and examined.  Agree with assessment and plan as noted above.  Changes made to the above note as needed.  Patient seen and independently examined with Chelsea Aus, PA .   We discussed all aspects of the encounter. I agree with the assessment and plan as stated above.  Pt presents with atypical CP. Has a very strong family hx of CAD - Still smokes 1 ppd.  CP lasted for hours - despite that, Troponins are negative  ECG shows some NS T wave changes.    I have spent a total of 40 minutes with patient reviewing hospital  notes , telemetry, EKGs, labs and examining patient as well as establishing an assessment and plan that was discussed with the patient. > 50% of time was spent in direct patient care.    Vesta Mixer, Montez Hageman., MD, [facility] 12/15/2015, 8:52 AM [address],  [address] Office [phone] Pager [phone]

## 2015-12-15 NOTE — Plan of Care (Signed)
Problem: Education: Goal: Knowledge of [facility] Education information/materials will improve Outcome: Progressing Patient and patient's significant other (who arrived with patient and remains at bedside) aware of plan of care.  Both patient and patient's significant other state understanding.  Medication education provided per RN on all medications administered thus far this shift.

## 2015-12-15 NOTE — Progress Notes (Signed)
Family Medicine Teaching Service Daily Progress Note Intern Pager: [phone]  Patient name: Miguel CoeGeorge L Kubly Jr. Medical record number: [id number] Date of birth: 1971-01-05 Age: 45 y.o. Gender: male  Primary Care Provider: Ross LudwigICALE, REBECCA K, NP Consultants: Cardiology Code Status: FULL  Pt Overview and Major Events to Date:  6/11: Admitted to FMTS for ACS rule out  Assessment and Plan: Miguel CoeGeorge L Brosh Jr. is a 45 y.o. male presenting with chest pain . PMH is significant for T2DM, hx CVA in 2015, HLD, blind in left eye, hx seizures.  Chest Pain, ACS rule out: Musculoskeletal etiology most likely, given that chest pain is reproducible on palpation of the sternum. Troponins negative x 2. EKG on admission with new slight ST depressions in the inferior leads, V5, and V6; T wave inversions in V4 and V5. Repeat EKG this morning with improvement in ST depressions and T wave inversions. Last ECHO in 2015 with EF 55-60%, normal wall motion. Chest pain improved this morning, but still present. - Cardiology consult, appreciate recommendations. Plan for myoview today.  - Continue ASA 325mg  daily - Morphine q2hrs prn for pain, Zofran for nausea - GI cocktail prn - Cardiac monitoring - Vitals per unit routine - risk strat labs: A1c pending, TSH 1.213, Lipid panel: chol 189, TG 334, HDL 30, LDL 92.  T2DM: Last A1c 12/2014: 10.2%. Takes Lantus 20 units qhs at home. CBGs 179-185. - Lantus 10 units qhs + moderate SSI - CBGs with meals and at bedtime - continue home Lyrica 150mg  bid  Hypokalemia: K 3.4 on admission > 3.7. - K-Dur 40mEq bid  Hx Sinus Tachycardia: HR controlled in the 70-80. - Continue Metoprolol succinate 25mg  daily.  Hx seizure disorder: [facility] - Continue home Keppra 1,000mg  bid  Hx CVA: in 2015. Pt with residual right sided weakness per patient. - Continue ASA 325mg .  HLD: Last lipid panel 05/2014 with LDL 55 - Continue home Lipitor 40mg   daily  Asthma: Normal work of breathing on exam. Stable on room air. - Continue home Albuterol.  FEN/GI: NPO this morning, will restart diet after myoview, SLIV. Prophylaxis: Lovenox  Disposition: Discharge home today or tomorrow, pending Myoview results.  Subjective:  Pt is doing fine this morning. He states he is still having chest pain, but his pain is improved from yesterday.  Objective: Temp:  [97.7 F (36.5 C)-98.5 F (36.9 C)] 98.5 F (36.9 C) (06/12 0500) Pulse Rate:  [76-99] 76 (06/12 0500) Resp:  [13-30] 18 (06/11 2046) BP: (106-130)/(75-93) 112/75 mmHg (06/12 0500) SpO2:  [94 %-98 %] 97 % (06/12 0500) Weight:  [204 lb (92.534 kg)-205 lb 7.5 oz (93.2 kg)] 205 lb (92.987 kg) (06/12 0500) Physical Exam: General: Tired-appearing male, laying in bed, in NAD, falling asleep intermittently. Eyes: PERRLA, EOMI, no scleral icterus ENTM: Nose normal, oropharynx clear, dry mucous membranes Neck: Supple, no adenopathy, no JVD Cardiovascular: RRR, no murmurs, sternum and L costochondral joints tender to palpation, 2+ DP pulses bilaterally Respiratory: Mild bibasilar crackles present, normal work of breathing Abdomen: +BS, soft, non-tender, non-distended MSK: No lower extremity edema, normal tone Skin: No rashes or lesions Neuro: Sleepy but arousable, oriented, CN 2-12 grossly intact, no focal deficits Psych: Appropriate affect, normal behavior.  Laboratory:  Recent Labs Lab 12/14/15 1845  WBC 6.7  HGB 14.4  HCT 41.5  PLT 103*    Recent Labs Lab 12/14/15 1845 12/14/15 2355  NA 136 135  K 3.4* 3.7  CL 102 102  CO2 23 23  BUN 9 9  CREATININE 0.73 0.74  CALCIUM 8.6* 7.9*  PROT 6.0*  --   BILITOT 0.4  --   ALKPHOS 99  --   ALT 43  --   AST 29  --   GLUCOSE 261* 247*   Troponin <0.03, <0.03 UDS neg UA: >1000 glucose, negative leukocytes, negative nitrites TSH 1.213 Lipid panel: chol 189, TG 334, HDL 30, LDL 92.  Imaging/Diagnostic Tests: EKG (6/11):  slight ST depression in the inferior leads, V5, and V6; T wave inversions in V4 and V5. Repeat EKG (6/12): improvement in ST depressions and T wave inversions. CXR: Lung lung volumes, faint subsegmental atelectasis in the left mid lung.  Campbell Stall, MD 12/15/2015, 7:27 AM PGY-1, [facility] FPTS Intern pager: [phone], text pages welcome

## 2015-12-15 NOTE — Progress Notes (Signed)
1 day lexiscan myoview completed without complication. [facility]  Signed, Azalee CourseHao Amerigo Mcglory PA Pager: [phone]

## 2015-12-15 NOTE — Progress Notes (Signed)
Patient discharge paperwork gone over in detail with patient and wife.All questions answered top patient satisfaction. Telemetry discontinued, IV x2 removed intact. Patient discharged home with wife by way of wheelchair.

## 2015-12-15 NOTE — Progress Notes (Signed)
Myoview was low risk. No wm abnormality, no reversible ischemia or infract. No further cardiac evaluation. F/u has been scheduled.   Shakevia Sarris, PAC

## 2015-12-15 NOTE — Discharge Instructions (Signed)
You were hospitalized because you were having chest pain. We were worried that your heart was being injured. We did a lot of different tests that showed your heart was functioning normally. We also did a stress test, which showed that your heart was functioning normally under stress too. We do not think your chest pain was caused by a heart problem. You likely have irritation of the bones and muscles of your chest wall. You can continue to use Ibuprofen and Tylenol at home, as well as a heating pad.   It was a pleasure taking care of you during this hospitalization!

## 2015-12-16 LAB — HEMOGLOBIN A1C
Hgb A1c MFr Bld: 8.7 % — ABNORMAL HIGH (ref 4.8–5.6)
Mean Plasma Glucose: 203 mg/dL

## 2016-01-12 ENCOUNTER — Ambulatory Visit: Payer: Self-pay | Admitting: Physician Assistant

## 2016-02-03 ENCOUNTER — Ambulatory Visit: Payer: Self-pay | Admitting: Physician Assistant

## 2016-02-18 ENCOUNTER — Encounter (HOSPITAL_COMMUNITY): Payer: Self-pay | Admitting: Emergency Medicine

## 2016-02-18 DIAGNOSIS — Z7984 Long term (current) use of oral hypoglycemic drugs: Secondary | ICD-10-CM | POA: Diagnosis not present

## 2016-02-18 DIAGNOSIS — Z8673 Personal history of transient ischemic attack (TIA), and cerebral infarction without residual deficits: Secondary | ICD-10-CM | POA: Diagnosis not present

## 2016-02-18 DIAGNOSIS — M5412 Radiculopathy, cervical region: Secondary | ICD-10-CM | POA: Diagnosis not present

## 2016-02-18 DIAGNOSIS — Z794 Long term (current) use of insulin: Secondary | ICD-10-CM | POA: Insufficient documentation

## 2016-02-18 DIAGNOSIS — F17210 Nicotine dependence, cigarettes, uncomplicated: Secondary | ICD-10-CM | POA: Insufficient documentation

## 2016-02-18 DIAGNOSIS — Z7982 Long term (current) use of aspirin: Secondary | ICD-10-CM | POA: Diagnosis not present

## 2016-02-18 DIAGNOSIS — E1159 Type 2 diabetes mellitus with other circulatory complications: Secondary | ICD-10-CM | POA: Diagnosis not present

## 2016-02-18 DIAGNOSIS — I10 Essential (primary) hypertension: Secondary | ICD-10-CM | POA: Diagnosis not present

## 2016-02-18 DIAGNOSIS — E1140 Type 2 diabetes mellitus with diabetic neuropathy, unspecified: Secondary | ICD-10-CM | POA: Insufficient documentation

## 2016-02-18 DIAGNOSIS — J45909 Unspecified asthma, uncomplicated: Secondary | ICD-10-CM | POA: Insufficient documentation

## 2016-02-18 DIAGNOSIS — M25512 Pain in left shoulder: Secondary | ICD-10-CM | POA: Diagnosis present

## 2016-02-18 NOTE — ED Triage Notes (Signed)
Pt. reports left shoulder pain onset Thursday morning , denies injury or fall , seen [facility] diagnosed with " trapezius strain and back spasms " unrelieved by Flexeril/Celebrex and Tylenol . Pain increases with movement /palpation and raising arm .

## 2016-02-19 ENCOUNTER — Emergency Department (HOSPITAL_COMMUNITY)
Admission: EM | Admit: 2016-02-19 | Discharge: 2016-02-19 | Disposition: A | Discharge status: Home / Self Care | Payer: Medicaid Other | Attending: Emergency Medicine | Admitting: Emergency Medicine

## 2016-02-19 DIAGNOSIS — M5412 Radiculopathy, cervical region: Secondary | ICD-10-CM

## 2016-02-19 MED ORDER — DIAZEPAM 5 MG PO TABS
5.0000 mg | ORAL_TABLET | Freq: Once | ORAL | Status: AC
Start: 2016-02-19 — End: 2016-02-19
  Administered 2016-02-19: 5 mg via ORAL
  Filled 2016-02-19: qty 1

## 2016-02-19 MED ORDER — HYDROCODONE-ACETAMINOPHEN 5-325 MG PO TABS: 1.0000 | ORAL_TABLET | Freq: Four times a day (QID) | ORAL | 0 refills | Status: DC | PRN

## 2016-02-19 MED ORDER — NAPROXEN 250 MG PO TABS
500.0000 mg | ORAL_TABLET | Freq: Once | ORAL | Status: AC
  Administered 2016-02-19: 500 mg via ORAL
  Filled 2016-02-19: qty 2

## 2016-02-19 NOTE — ED Provider Notes (Signed)
[facility]-EMERGENCY DEPT Provider Note   CSN: [id number] Arrival date & time: 02/18/16  1853   By signing my name below, I, Christel MormonMatthew Jamison, attest that this documentation has been prepared under the direction and in the presence of Shon Batonourtney F Horton, MD . Electronically Signed: Christel MormonMatthew Jamison, Scribe. 02/19/2016. 3:25 AM.   History   Chief Complaint Chief Complaint  Patient presents with  . Shoulder Pain    The history is provided by the patient. No language interpreter was used.   HPI Comments:  Miguel CoeGeorge L Mollica Jr. is a 45 y.o. male with a Hx of DM , HDL, neuropathy, seizures, and CVA who presents to the Emergency Department complaining of sudden onset progressively worsening, 8-9/10 left shoulder pain that radiates down his left arm onset 1 week ago. Pt complains of weakness, numbness, tingling, and left sided back pain. Pt says he has been having difficulty sleeping secondary to pain. Pt notes that he [facility] and was diagnosed with back spasms and a trapezius sprain. Pt has applied ice, heat, and taken flexeril/celebrex with no relief. Pt initially experienced relief by massaging the site of pain but now massaging exacerbates the pain. Pt notes no other alleviating factors. Pt denies any known injury to the area.   Past Medical History:  Diagnosis Date  . Anxiety   . Asthma   . Blindness    bilateral  . Depression   . Diabetes mellitus without complication (HCC)   . Hypercholesteremia   . Neuropathy (HCC)   . PSVT (paroxysmal supraventricular tachycardia) (HCC) 06/2015  . Stroke (HCC) 02/2014   right sided weakness/numbness  . Vision loss of left eye     Patient Active Problem List   Diagnosis Date Noted  . Pain in the chest   . Type 2 diabetes mellitus without complication, with long-term current use of insulin (HCC)   . Hyperlipidemia   . Tobacco use disorder   . Chest pain 12/14/2015  . Episode of altered consciousness 01/15/2015  . Encephalopathy  acute 12/18/2014  . Altered mental status   . LOC (loss of consciousness)   . 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 Encephalopathy 08/01/2014  . Migraine 07/31/2014  . 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 Orthostatic hypotension 06/03/2014  . 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 Cerebral thrombosis with cerebral infarction (HCC) 05/20/2014  . Right sided weakness 05/19/2014  . Stroke (HCC)   . DM type 2 with diabetic peripheral neuropathy (HCC) 04/09/2014  . 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 Essential hypertension, benign 03/27/2014  . HLD (hyperlipidemia) 03/27/2014  . Type II or unspecified type diabetes mellitus with peripheral circulatory disorders, uncontrolled(250.72) 02/12/2014  . Tobacco abuse 02/12/2014  . CVA (cerebral infarction) 02/11/2014  . Syncope 01/25/2014  . Hip pain 01/25/2014  . SAH (subarachnoid hemorrhage) (HCC) 01/25/2014  . Vision, loss, sudden 01/25/2014  . Type II or unspecified type diabetes mellitus without mention of complication, uncontrolled 01/25/2014    Past Surgical History:  Procedure Laterality Date  . ABDOMINAL SURGERY    . EYE SURGERY Left   . HERNIA REPAIR         Home Medications    Prior to Admission medications   Medication Sig Start Date End Date Taking? Authorizing Provider  albuterol (PROVENTIL HFA;VENTOLIN HFA) 108 (90 BASE) MCG/ACT inhaler Inhale 2 puffs into the lungs every 6 (six) hours as needed for wheezing or shortness  of breath.    Historical Provider, MD  aspirin 325 MG tablet Take 1 tablet (325 mg total) by mouth daily. 05/21/14   Joseph Art, DO  atorvastatin (LIPITOR) 40 MG tablet Take 40 mg by mouth at bedtime.     Historical Provider, MD  celecoxib (CELEBREX) 200 MG capsule Take 200 mg by mouth daily.    Historical Provider, MD  FLUoxetine (PROZAC) 40 MG capsule Take 40 mg by mouth daily.     Historical Provider, MD  fluticasone (FLONASE) 50 MCG/ACT nasal spray Place 1 spray into both nostrils daily as needed for allergies or rhinitis.    Historical Provider, MD  glipiZIDE (GLUCOTROL XL) 10 MG 24 hr tablet Take 10 mg by mouth daily with breakfast.    Historical Provider, MD  HYDROcodone-acetaminophen (NORCO/VICODIN) 5-325 MG tablet Take 1 tablet by mouth every 6 (six) hours as needed. 02/19/16   Shon Baton, MD  ibuprofen (ADVIL,MOTRIN) 600 MG tablet Take 600 mg by mouth every 6 (six) hours as needed for moderate pain.    Historical Provider, MD  insulin glargine (LANTUS) 100 UNIT/ML injection Inject 20 Units into the skin at bedtime.    Historical Provider, MD  levETIRAcetam (KEPPRA) 1000 MG tablet Take 1 tablet (1,000 mg total) by mouth 2 (two) times daily. 02/04/15   Lavera Guise, MD  metFORMIN (GLUCOPHAGE-XR) 500 MG 24 hr tablet Take 1,000 mg by mouth 2 (two) times daily.    Historical Provider, MD  metoprolol succinate (TOPROL XL) 25 MG 24 hr tablet Take 1 tablet (25 mg total) by mouth daily. 08/18/15   Cassell Clement, MD  Multiple Vitamins-Minerals (MULTIVITAMIN WITH MINERALS) tablet Take 1 tablet by mouth daily.    Historical Provider, MD  pregabalin (LYRICA) 150 MG capsule Take 1 capsule (150 mg total) by mouth 2 (two) times daily. 12/19/14   Vassie Loll, MD    Family History Family History  Problem Relation Age of Onset  . High blood pressure Mother   . Diabetes Mellitus I Mother   . High blood pressure Father   . Diabetes Mellitus I Father   . Migraines Daughter   . Parkinsonism Daughter   . Parkinsonism Son   . High blood pressure Sister   . High blood pressure Paternal Grandfather     Social History Social History  Substance Use Topics  . Smoking status: Current Every Day Smoker    Packs/day: 0.00    Types: Cigarettes, E-cigarettes  . Smokeless tobacco: Never Used     Comment: he does electonic cigarettes sometmes too  . Alcohol use 0.0 oz/week      Allergies   Penicillins and Toradol [ketorolac tromethamine]   Review of Systems Review of Systems  Musculoskeletal: Positive for arthralgias, back pain and neck pain.  Neurological: Positive for numbness.  All other systems reviewed and are negative.    Physical Exam Updated Vital Signs BP (!) 155/110   Pulse 112   Temp 97.9 F (36.6 C) (Oral)   Resp 16   Ht 5\' 7"  (1.702 m)   Wt 202 lb (91.6 kg)   SpO2 98%   BMI 31.64 kg/m   Physical Exam  Constitutional: He is oriented to person, place, and time.  Appears older than stated age  HENT:  Head: Normocephalic and atraumatic.  Neck: Normal range of motion.  No midline tenderness, step-off or deformity  Cardiovascular: Normal rate and regular rhythm.   Pulmonary/Chest: Effort normal. No respiratory distress.  Musculoskeletal: He exhibits no  edema.  Focused examination of the left shoulder reveals normal range of motion, he has tenderness to palpation over the trapezius and rhomboid, 2+ radial pulse  Neurological: He is alert and oriented to person, place, and time.  5 out of 5 strength in grip, deltoids, biceps, and triceps strength bilaterally  Skin: Skin is warm and dry.  Psychiatric: He has a normal mood and affect.  Nursing note and vitals reviewed.    ED Treatments / Results  DIAGNOSTIC STUDIES:  Oxygen Saturation is 98% on room air, normal by my interpretation.    COORDINATION OF CARE:  2:59 AM Discussed treatment plan with pt at bedside and pt agreed to plan.  Labs (all labs ordered are listed, but only abnormal results are displayed) Labs Reviewed - No data to display  EKG  EKG Interpretation None       Radiology No results found.  Procedures Procedures (including critical care time)  Medications Ordered in ED Medications  naproxen (NAPROSYN) tablet 500 mg (500 mg Oral Given 02/19/16 0327)  diazepam (VALIUM) tablet 5 mg (5 mg Oral Given 02/19/16 0328)     Initial Impression /  Assessment and Plan / ED Course  I have reviewed the triage vital signs and the nursing notes.  Pertinent labs & imaging results that were available during my care of the patient were reviewed by me and considered in my medical decision making (see chart for details).  Clinical Course    Patient presents with continued left arm pain. He was diagnosed with muscle spasms. Pain is refractory to muscle relaxers and anti-inflammatories. He is neurologically intact. He does report radiation of the pain from the neck down his left arm. He could have a radicular component. He is not a good candidate for steroids given his diabetes. He will be given a short course of Norco for pain. Continue Celebrex as prescribed.  After history, exam, and medical workup I feel the patient has been appropriately medically screened and is safe for discharge home. Pertinent diagnoses were discussed with the patient. Patient was given return precautions.   Final Clinical Impressions(s) / ED Diagnoses   Final diagnoses:  Cervical radiculopathy    New Prescriptions New Prescriptions   HYDROCODONE-ACETAMINOPHEN (NORCO/VICODIN) 5-325 MG TABLET    Take 1 tablet by mouth every 6 (six) hours as needed.   I personally performed the services described in this documentation, which was scribed in my presence. The recorded information has been reviewed and is accurate.     Shon Batonourtney F Horton, MD 02/19/16 [phone]

## 2016-02-19 NOTE — ED Notes (Signed)
Pt asking for the wait time  given

## 2016-03-04 ENCOUNTER — Emergency Department (HOSPITAL_COMMUNITY)
Admission: EM | Admit: 2016-03-04 | Discharge: 2016-03-05 | Disposition: A | Discharge status: Home / Self Care | Payer: Medicaid Other | Attending: Emergency Medicine | Admitting: Emergency Medicine

## 2016-03-04 ENCOUNTER — Emergency Department (HOSPITAL_COMMUNITY): Payer: Medicaid Other

## 2016-03-04 DIAGNOSIS — J45909 Unspecified asthma, uncomplicated: Secondary | ICD-10-CM | POA: Insufficient documentation

## 2016-03-04 DIAGNOSIS — I10 Essential (primary) hypertension: Secondary | ICD-10-CM | POA: Diagnosis not present

## 2016-03-04 DIAGNOSIS — Z79899 Other long term (current) drug therapy: Secondary | ICD-10-CM | POA: Insufficient documentation

## 2016-03-04 DIAGNOSIS — Z794 Long term (current) use of insulin: Secondary | ICD-10-CM | POA: Insufficient documentation

## 2016-03-04 DIAGNOSIS — F17210 Nicotine dependence, cigarettes, uncomplicated: Secondary | ICD-10-CM | POA: Insufficient documentation

## 2016-03-04 DIAGNOSIS — E1142 Type 2 diabetes mellitus with diabetic polyneuropathy: Secondary | ICD-10-CM | POA: Diagnosis not present

## 2016-03-04 DIAGNOSIS — Z7982 Long term (current) use of aspirin: Secondary | ICD-10-CM | POA: Insufficient documentation

## 2016-03-04 DIAGNOSIS — Z7984 Long term (current) use of oral hypoglycemic drugs: Secondary | ICD-10-CM | POA: Insufficient documentation

## 2016-03-04 DIAGNOSIS — Z8673 Personal history of transient ischemic attack (TIA), and cerebral infarction without residual deficits: Secondary | ICD-10-CM | POA: Insufficient documentation

## 2016-03-04 DIAGNOSIS — R079 Chest pain, unspecified: Secondary | ICD-10-CM | POA: Diagnosis present

## 2016-03-04 LAB — BASIC METABOLIC PANEL
ANION GAP: 8 (ref 5–15)
BUN: 8 mg/dL (ref 6–20)
CALCIUM: 8.4 mg/dL — AB (ref 8.9–10.3)
CO2: 24 mmol/L (ref 22–32)
CREATININE: 0.8 mg/dL (ref 0.61–1.24)
Chloride: 104 mmol/L (ref 101–111)
GFR calc Af Amer: >60 mL/min (ref >60)
GFR calc non Af Amer: >60 mL/min (ref >60)
GLUCOSE: 328 mg/dL — AB (ref 65–99)
Potassium: 3.5 mmol/L (ref 3.5–5.1)
Sodium: 136 mmol/L (ref 135–145)

## 2016-03-04 LAB — CBC WITH DIFFERENTIAL/PLATELET
Basophils Absolute: 0 10*3/uL (ref 0.0–0.1)
Basophils Relative: 1 %
EOS PCT: 4 %
Eosinophils Absolute: 0.3 10*3/uL (ref 0.0–0.7)
HCT: 41 % (ref 39.0–52.0)
Hemoglobin: 14.4 g/dL (ref 13.0–17.0)
LYMPHS ABS: 2.2 10*3/uL (ref 0.7–4.0)
LYMPHS PCT: 34 %
MCH: 32.2 pg (ref 26.0–34.0)
MCHC: 35.1 g/dL (ref 30.0–36.0)
MCV: 91.7 fL (ref 78.0–100.0)
MONO ABS: 0.3 10*3/uL (ref 0.1–1.0)
MONOS PCT: 5 %
Neutro Abs: 3.5 10*3/uL (ref 1.7–7.7)
Neutrophils Relative %: 56 %
PLATELETS: 123 10*3/uL — AB (ref 150–400)
RBC: 4.47 MIL/uL (ref 4.22–5.81)
RDW: 12.4 % (ref 11.5–15.5)
WBC: 6.3 10*3/uL (ref 4.0–10.5)

## 2016-03-04 LAB — I-STAT TROPONIN, ED: Troponin i, poc: 0 ng/mL (ref 0.00–0.08)

## 2016-03-04 MED ORDER — SODIUM CHLORIDE 0.9 % IV BOLUS (SEPSIS)
1000.0000 mL | Freq: Once | INTRAVENOUS | Status: AC
  Administered 2016-03-04: 1000 mL via INTRAVENOUS

## 2016-03-04 MED ORDER — IOPAMIDOL (ISOVUE-370) INJECTION 76%
INTRAVENOUS | Status: AC
  Filled 2016-03-04: qty 100

## 2016-03-04 MED ORDER — IOPAMIDOL (ISOVUE-370) INJECTION 76%
100.0000 mL | Freq: Once | INTRAVENOUS | Status: AC | PRN
  Administered 2016-03-04: 100 mL via INTRAVENOUS

## 2016-03-04 NOTE — ED Provider Notes (Signed)
[facility]-EMER[facility]Y DEPT Provider Note   CSN: [id number] Arrival date & time: 03/04/16  2130   History   Chief Complaint Chief Complaint  Patient presents with  . Chest Pain    HPI Miguel Petro. is a 45 y.o. male.  The history is provided by the patient, medical records and the EMS personnel.   45 year old male with history of CVA with mild right-sided deficits, neuropathy, diabetes, blindness, PSVT on metoprolol, tobacco abuse presenting with chest pain. Onset was 7 PM. Constant since then. Severe. Located in left chest. Radiating around back. Worse with deep inspiration. Described as sharp. Associated with mild feeling of shortness of breath. Alleviated somewhat by rest and lying still. No associated nausea, vomiting, cough, fever, diaphoresis. No leg swelling. No history of prior DVT/PE.   Past Medical History:  Diagnosis Date  . Anxiety   . Asthma   . Blindness    bilateral  . Depression   . Diabetes mellitus without complication (HCC)   . Hypercholesteremia   . Neuropathy (HCC)   . PSVT (paroxysmal supraventricular tachycardia) (HCC) 06/2015  . Stroke (HCC) 02/2014   right sided weakness/numbness  . Vision loss of left eye     Patient Active Problem List   Diagnosis Date Noted  . Pain in the chest   . Type 2 diabetes mellitus without complication, with long-term current use of insulin (HCC)   . Hyperlipidemia   . Tobacco use disorder   . Chest pain 12/14/2015  . Episode of altered consciousness 01/15/2015  . Encephalopathy acute 12/18/2014  . Altered mental status   . LOC (loss of consciousness)   . 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 Encephalopathy 08/01/2014  . Migraine 07/31/2014  . 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 Orthostatic hypotension 06/03/2014  . 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 Cerebral thrombosis with cerebral infarction (HCC) 05/20/2014  . Right sided  weakness 05/19/2014  . Stroke (HCC)   . DM type 2 with diabetic peripheral neuropathy (HCC) 04/09/2014  . 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 Essential hypertension, benign 03/27/2014  . HLD (hyperlipidemia) 03/27/2014  . Type II or unspecified type diabetes mellitus with peripheral circulatory disorders, uncontrolled(250.72) 02/12/2014  . Tobacco abuse 02/12/2014  . CVA (cerebral infarction) 02/11/2014  . Syncope 01/25/2014  . Hip pain 01/25/2014  . SAH (subarachnoid hemorrhage) (HCC) 01/25/2014  . Vision, loss, sudden 01/25/2014  . Type II or unspecified type diabetes mellitus without mention of complication, uncontrolled 01/25/2014    Past Surgical History:  Procedure Laterality Date  . ABDOMINAL SURGERY    . EYE SURGERY Left   . HERNIA REPAIR         Home Medications    Prior to Admission medications   Medication Sig Start Date End Date Taking? Authorizing Provider  albuterol (PROVENTIL HFA;VENTOLIN HFA) 108 (90 BASE) MCG/ACT inhaler Inhale 2 puffs into the lungs every 6 (six) hours as needed for wheezing or shortness of breath.   Yes Historical Provider, MD  aspirin 325 MG tablet Take 1 tablet (325 mg total) by mouth daily. 05/21/14  Yes Joseph Art, DO  atorvastatin (LIPITOR) 40 MG tablet Take 40 mg by mouth at bedtime.    Yes Historical Provider, MD  celecoxib (CELEBREX) 200 MG capsule Take 200 mg by mouth daily.   Yes Historical Provider, MD  FLUoxetine (PROZAC) 40 MG capsule Take 40 mg by mouth daily.  Yes Historical Provider, MD  fluticasone (FLONASE) 50 MCG/ACT nasal spray Place 1 spray into both nostrils daily as needed for allergies or rhinitis.   Yes Historical Provider, MD  glipiZIDE (GLUCOTROL XL) 10 MG 24 hr tablet Take 10 mg by mouth daily with breakfast.   Yes Historical Provider, MD  HYDROcodone-acetaminophen (NORCO/VICODIN) 5-325 MG tablet Take 1 tablet by mouth every 6 (six) hours as needed. Patient taking  differently: Take 1 tablet by mouth every 6 (six) hours as needed for moderate pain.  02/19/16  Yes Shon Baton, MD  ibuprofen (ADVIL,MOTRIN) 600 MG tablet Take 600 mg by mouth every 6 (six) hours as needed for moderate pain.   Yes Historical Provider, MD  insulin glargine (LANTUS) 100 UNIT/ML injection Inject 20 Units into the skin at bedtime.   Yes Historical Provider, MD  levETIRAcetam (KEPPRA) 1000 MG tablet Take 1 tablet (1,000 mg total) by mouth 2 (two) times daily. 02/04/15  Yes Lavera Guise, MD  metFORMIN (GLUCOPHAGE-XR) 500 MG 24 hr tablet Take 1,000 mg by mouth 2 (two) times daily.   Yes Historical Provider, MD  metoprolol succinate (TOPROL XL) 25 MG 24 hr tablet Take 1 tablet (25 mg total) by mouth daily. 08/18/15  Yes Cassell Clement, MD  Multiple Vitamins-Minerals (MULTIVITAMIN WITH MINERALS) tablet Take 1 tablet by mouth daily.   Yes Historical Provider, MD  pregabalin (LYRICA) 150 MG capsule Take 1 capsule (150 mg total) by mouth 2 (two) times daily. 12/19/14  Yes Vassie Loll, MD    Family History Family History  Problem Relation Age of Onset  . High blood pressure Mother   . Diabetes Mellitus I Mother   . High blood pressure Father   . Diabetes Mellitus I Father   . Migraines Daughter   . Parkinsonism Daughter   . Parkinsonism Son   . High blood pressure Sister   . High blood pressure Paternal Grandfather     Social History Social History  Substance Use Topics  . Smoking status: Current Every Day Smoker    Packs/day: 0.00    Types: Cigarettes, E-cigarettes  . Smokeless tobacco: Never Used     Comment: he does electonic cigarettes sometmes too  . Alcohol use 0.0 oz/week     Allergies   Penicillins and Toradol [ketorolac tromethamine]   Review of Systems Review of Systems  Constitutional: Negative for diaphoresis and fever.  Respiratory: Positive for shortness of breath. Negative for cough.   Cardiovascular: Positive for chest pain. Negative for leg  swelling.  Gastrointestinal: Negative for abdominal pain, nausea and vomiting.  Genitourinary: Negative for decreased urine volume.  Musculoskeletal: Positive for arthralgias (recent left shoulder pain).  Skin: Negative for rash.  Neurological: Negative for syncope.  Hematological: Does not bruise/bleed easily.  Psychiatric/Behavioral: The patient is not nervous/anxious.   All other systems reviewed and are negative.   Physical Exam Updated Vital Signs BP 126/87   Pulse 98   Temp 97.7 F (36.5 C) (Oral)   Resp 21   Ht 5\' 8"  (1.727 m)   Wt 92.5 kg   SpO2 98%   BMI 31.02 kg/m   Physical Exam  Constitutional: He appears well-developed and well-nourished.  HENT:  Head: Normocephalic and atraumatic.  Eyes: Conjunctivae are normal.  Neck: Neck supple.  Cardiovascular: Regular rhythm.  Tachycardia present.   No murmur heard. Pulmonary/Chest: Effort normal and breath sounds normal. No respiratory distress. He has no wheezes. He exhibits tenderness (mildly TTP over left lower anterior and lateral chest  wall).  Abdominal: Soft. There is no tenderness.  Musculoskeletal: He exhibits no edema.  Neurological: He is alert.  Skin: Skin is warm and dry. He is not diaphoretic.  Psychiatric: He has a normal mood and affect.  Nursing note and vitals reviewed.   ED Treatments / Results  Labs (all labs ordered are listed, but only abnormal results are displayed) Labs Reviewed  CBC WITH DIFFERENTIAL/PLATELET - Abnormal; Notable for the following:       Result Value   Platelets 123 (*)    All other components within normal limits  BASIC METABOLIC PANEL - Abnormal; Notable for the following:    Glucose, Bld 328 (*)    Calcium 8.4 (*)    All other components within normal limits  Rosezena Sensor, ED  Rosezena Sensor, ED  Rosezena Sensor, ED    EKG  EKG Interpretation  Date/Time:  Thursday March 04 2016 21:31:49 EDT Ventricular Rate:  115 PR Interval:    QRS Duration: 79 QT  Interval:  327 QTC Calculation: 453 R Axis:   54 Text Interpretation:  Sinus tachycardia Borderline T abnormalities, diffuse leads Abnormal ekg Confirmed by Gerhard Munch  MD ([id number]) on 03/04/2016 9:43:38 PM       Radiology Dg Chest 2 View  Result Date: 03/04/2016 CLINICAL DATA:  Left-sided chest pain and shortness of breath today. Sharp pleuritic left chest pain. EXAM: CHEST  2 VIEW COMPARISON:  02/14/2016 FINDINGS: The heart size and mediastinal contours are within normal limits. Both lungs are clear. The visualized skeletal structures are unremarkable. IMPRESSION: No active cardiopulmonary disease. Electronically Signed   By: Burman Nieves M.D.   On: 03/04/2016 22:07   Ct Angio Chest Pe W And/or Wo Contrast  Result Date: 03/04/2016 CLINICAL DATA:  Acute onset pleuritic LEFT chest pain. History of stroke, diabetes, hypertension. EXAM: CT ANGIOGRAPHY CHEST WITH CONTRAST TECHNIQUE: Multidetector CT imaging of the chest was performed using the standard protocol during bolus administration of intravenous contrast. Multiplanar CT image reconstructions and MIPs were obtained to evaluate the vascular anatomy. CONTRAST:  100 cc Isovue 370 COMPARISON:  Chest radiograph March 04, 2016 and CTA chest May 25, 2014 FINDINGS: PULMONARY ARTERY: Adequate contrast opacification of the pulmonary artery's. Main pulmonary artery is not enlarged. No pulmonary arterial filling defects to the level of the subsegmental branches. MEDIASTINUM: Heart size is normal, no right heart strain. Mild coronary artery calcifications. Thoracic aorta is normal course and caliber, mild calcific atherosclerosis. No lymphadenopathy by CT size criteria. Duplicated superior vena cava. LUNGS: Tracheobronchial tree is patent, no pneumothorax. No pleural effusions, focal consolidations, pulmonary nodules or masses. Heterogeneous lung attenuation. SOFT TISSUES AND OSSEOUS STRUCTURES: Included view of the abdomen is unremarkable.  Visualized soft tissues and included osseous structures appear normal. Review of the MIP images confirms the above findings. IMPRESSION: No acute pulmonary embolism. Heterogeneous lung attenuation associated with airway trapping/small airway disease. Electronically Signed   By: Awilda Metro M.D.   On: 03/04/2016 23:14    Procedures Procedures (including critical care time)  Medications Ordered in ED Medications  iopamidol (ISOVUE-370) 76 % injection (not administered)  sodium chloride 0.9 % bolus 1,000 mL (1,000 mLs Intravenous New Bag/Given 03/04/16 2224)  iopamidol (ISOVUE-370) 76 % injection 100 mL (100 mLs Intravenous Contrast Given 03/04/16 2255)     Initial Impression / Assessment and Plan / ED Course  I have reviewed the triage vital signs and the nursing notes.  Pertinent labs & imaging results that were available during my care of  the patient were reviewed by me and considered in my medical decision making (see chart for details).  Clinical Course    45 year old male with history of CVA with mild right-sided deficits, neuropathy, diabetes, blindness, PSVT on metoprolol, tobacco abuse presenting with chest pain as above. Afebrile, tachycardic upon arrival. Patient states his heart rate tends to run high and that he is on metoprolol for this. History and tachycardia concerning for possible PE. However, CTA is negative for PE. Doubt ACS with negative troponins 2 recent negative stress test in June and no acute changes on EKG compared to priors. No evidence of pneumonia, not wheezing to suggest asthma exacerbation. Possible musculoskeletal/chest wall pain given slight reproducibility on palpation, and patient's recent report of shoulder injury that he thinks may have triggered this pain as well. Patient already has f/u appt with his PCP scheduled for Wednesday. Return precautions discussed. Dc in stable condition.   Case discussed with Dr. Jeraldine LootsLockwood who oversaw management of this  patient.   Final Clinical Impressions(s) / ED Diagnoses   Final diagnoses:  Chest pain, unspecified chest pain type    New Prescriptions New Prescriptions   No medications on file     Urban GibsonJenny Rabiah Goeser, MD 03/05/16 [id number]    Gerhard Munchobert Lockwood, MD 03/06/16 [phone]

## 2016-03-04 NOTE — ED Triage Notes (Signed)
Pt arrives to [facility] at this time [facility].  Pt reports that he began having left sided chest pain that started at 1900 this evening. Pt states that he took 325 mg ASA prior to Ems arrival.  EMS reports administering 2 sublingual NTG tablets.  Pt currently rates his chest pain at a 4/10.   Chief Complaint  Patient presents with  . Chest Pain   Past Medical History:  Diagnosis Date  . Anxiety   . Asthma   . Blindness    bilateral  . Depression   . Diabetes mellitus without complication (HCC)   . Hypercholesteremia   . Neuropathy (HCC)   . PSVT (paroxysmal supraventricular tachycardia) (HCC) 06/2015  . Stroke (HCC) 02/2014   right sided weakness/numbness  . Vision loss of left eye

## 2016-03-04 NOTE — ED Notes (Signed)
Patient transported to CT at this time. Pt in no apparent distress at this time.

## 2016-03-05 LAB — I-STAT TROPONIN, ED: TROPONIN I, POC: 0 ng/mL (ref 0.00–0.08)

## 2016-03-05 NOTE — ED Notes (Signed)
Pt provided with d/c instructions at this time. Pt verbalizes understanding of d/c instructions as well as follow up procedure after d/c.  No new RX at time of d/c.  Pt in no apparent distress at this time. Pt assisted out of the ED via WC at this time.   

## 2016-04-12 IMAGING — DX DG CHEST 2V
2 series · 2 of 2 positions shown · non-contrast
Comparison: 09/27/2014

CLINICAL DATA: Loss of consciousness in the back yd. Low chest pain
when taking of breath.

EXAM:
CHEST  2 VIEW

[chest lat]
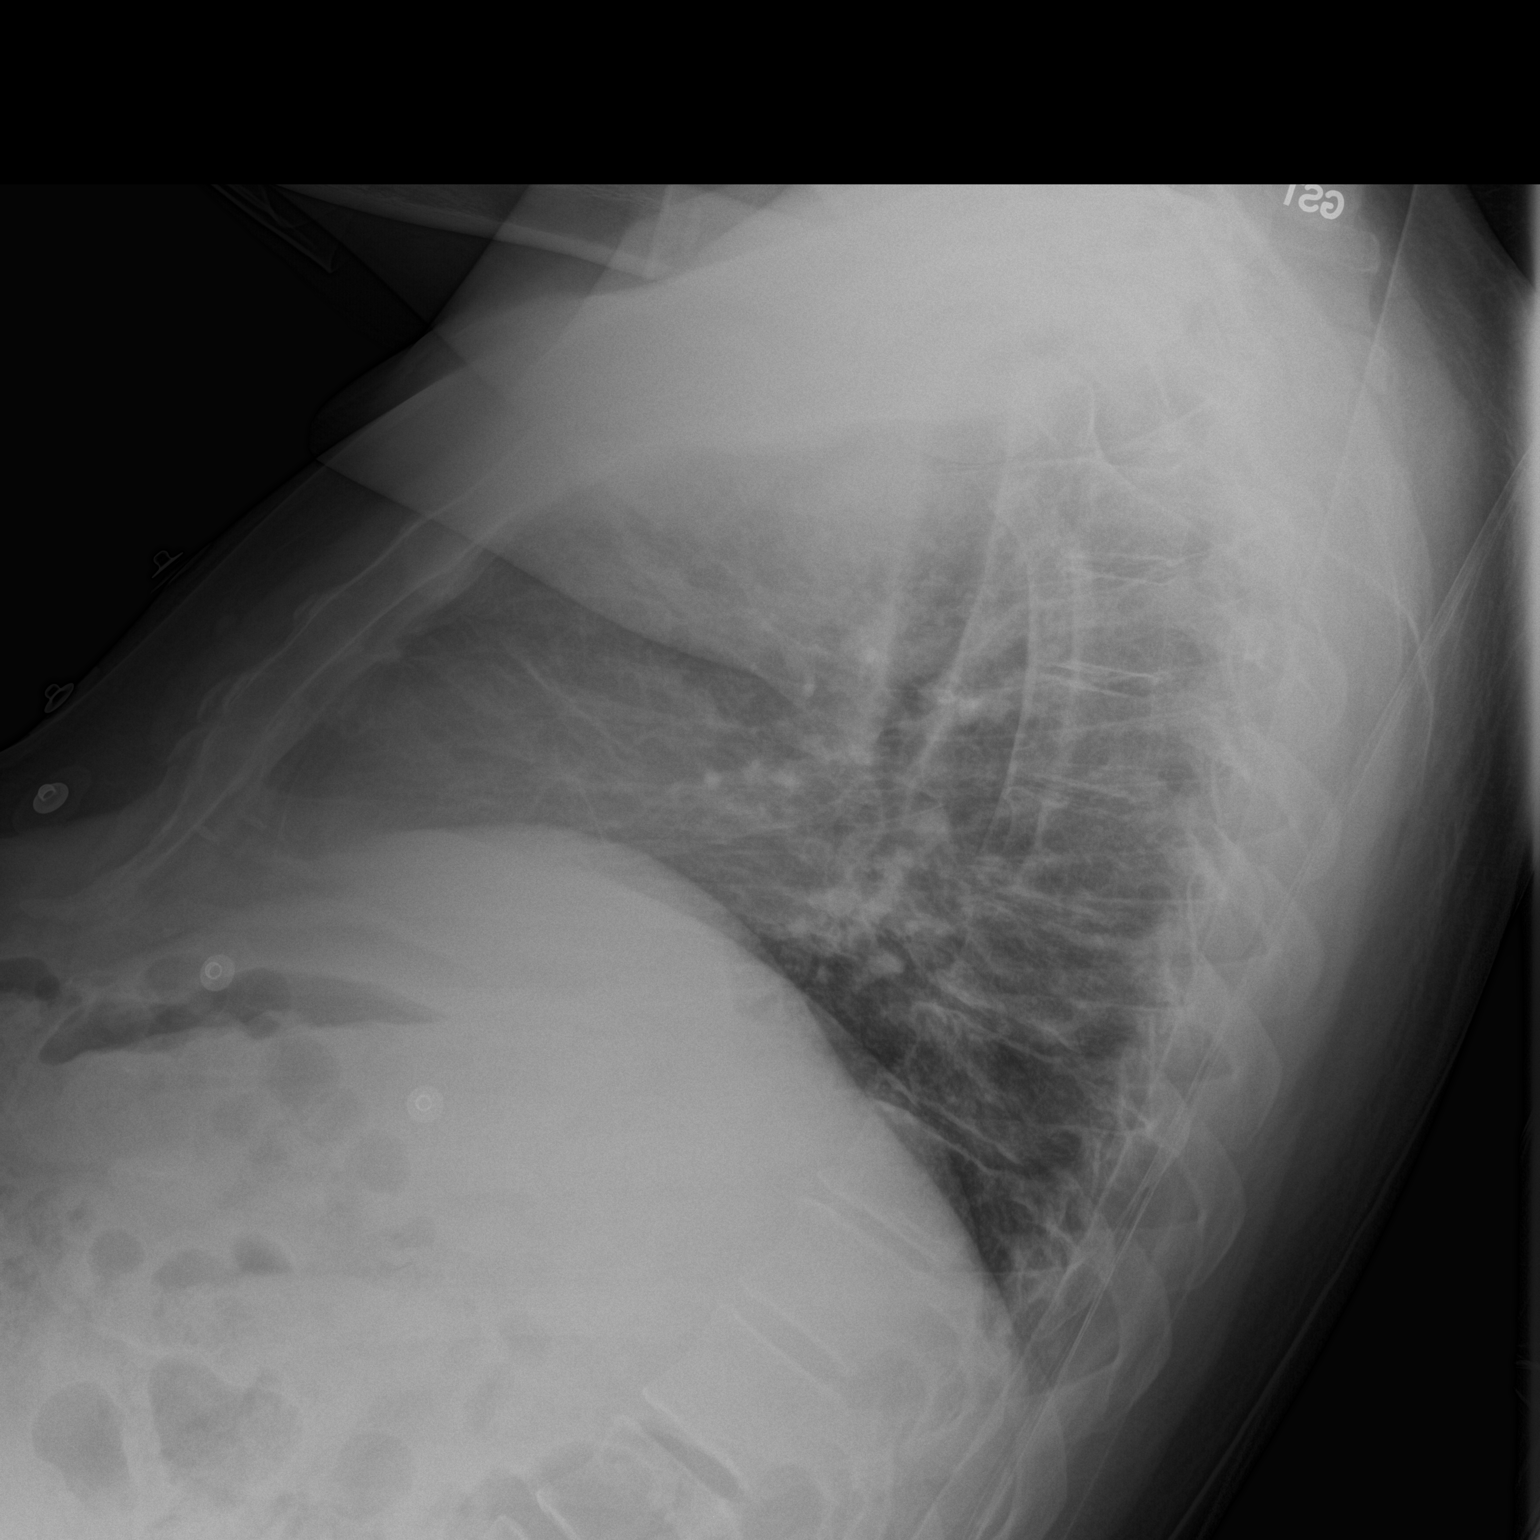

[chest ap]
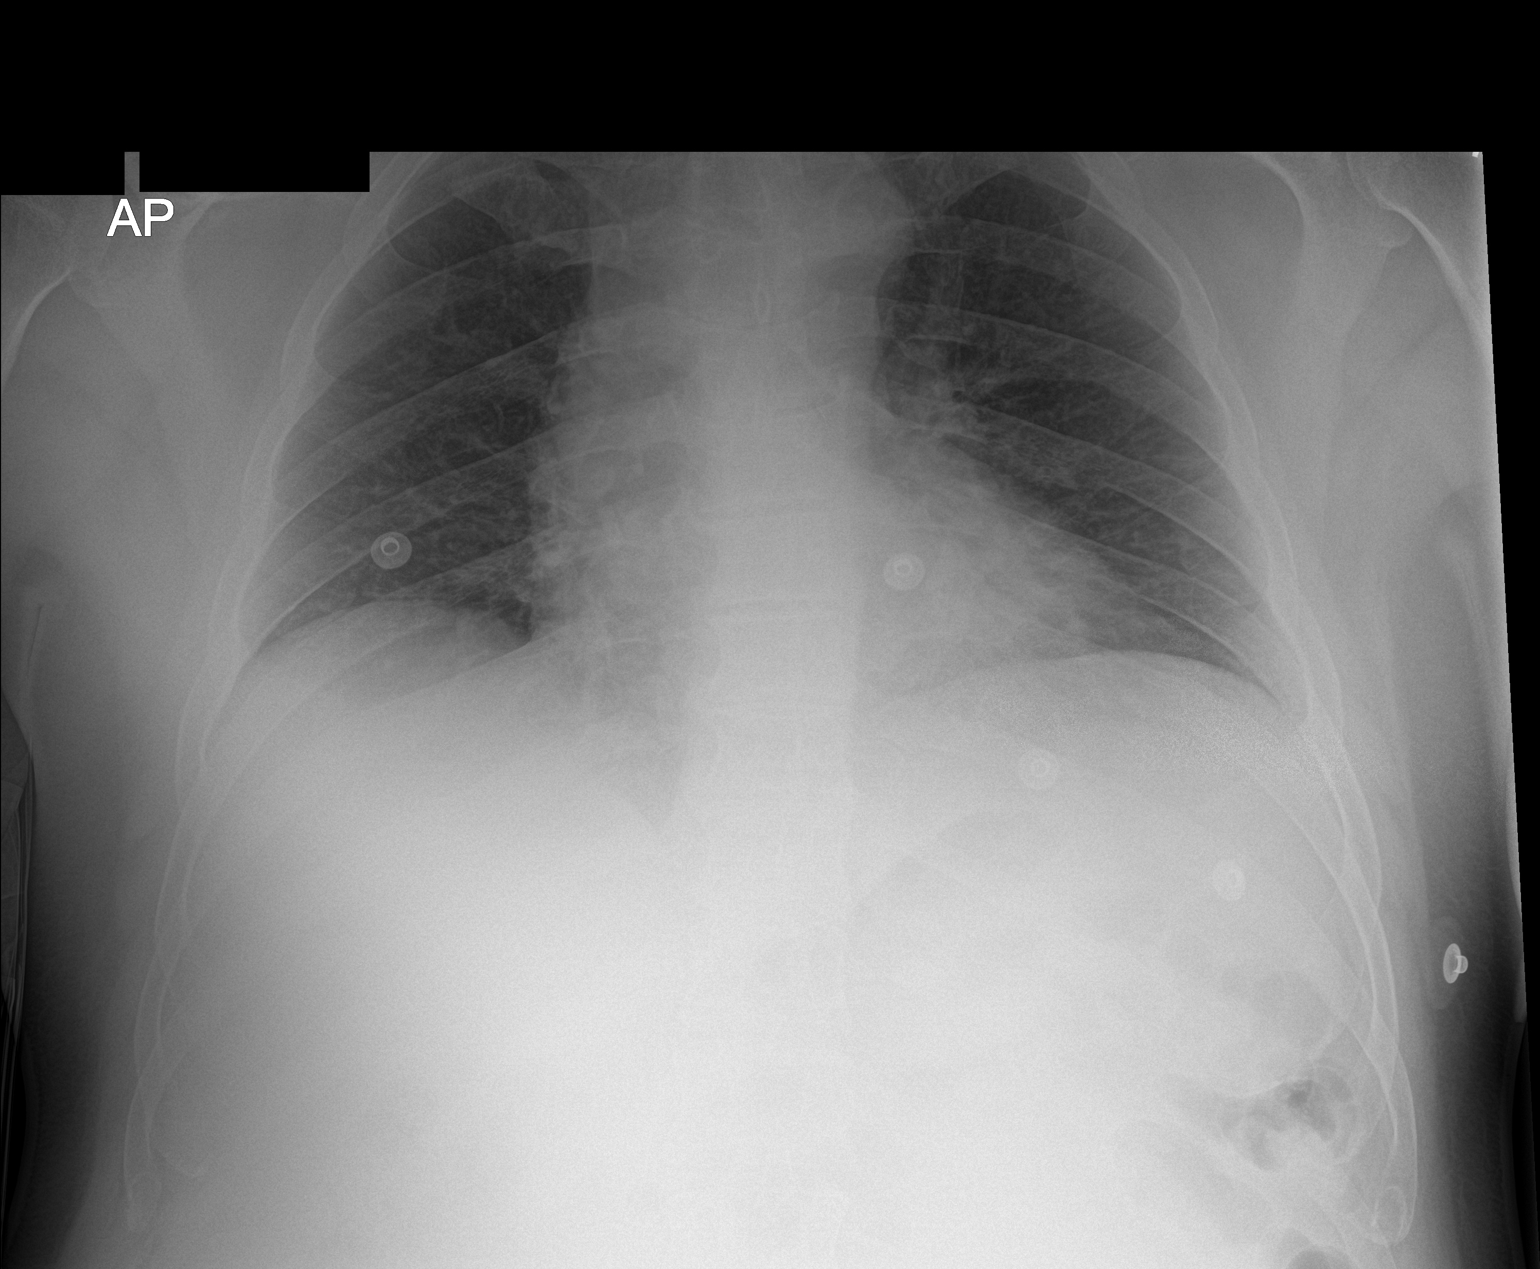

[2 of 2 positions shown; findings below may reference images not displayed]

FINDINGS: Shallow inspiration. The heart size and mediastinal contours are
within normal limits. Both lungs are clear. The visualized skeletal
structures are unremarkable.
IMPRESSION: No active cardiopulmonary disease.

## 2016-04-12 IMAGING — DX DG KNEE COMPLETE 4+V*R*
4 series · 4 of 4 positions shown · non-contrast
Comparison: 02/23/2014

CLINICAL DATA: Loss of consciousness.  Knee pain.

EXAM:
RIGHT KNEE - COMPLETE 4+ VIEW

[knee ap]
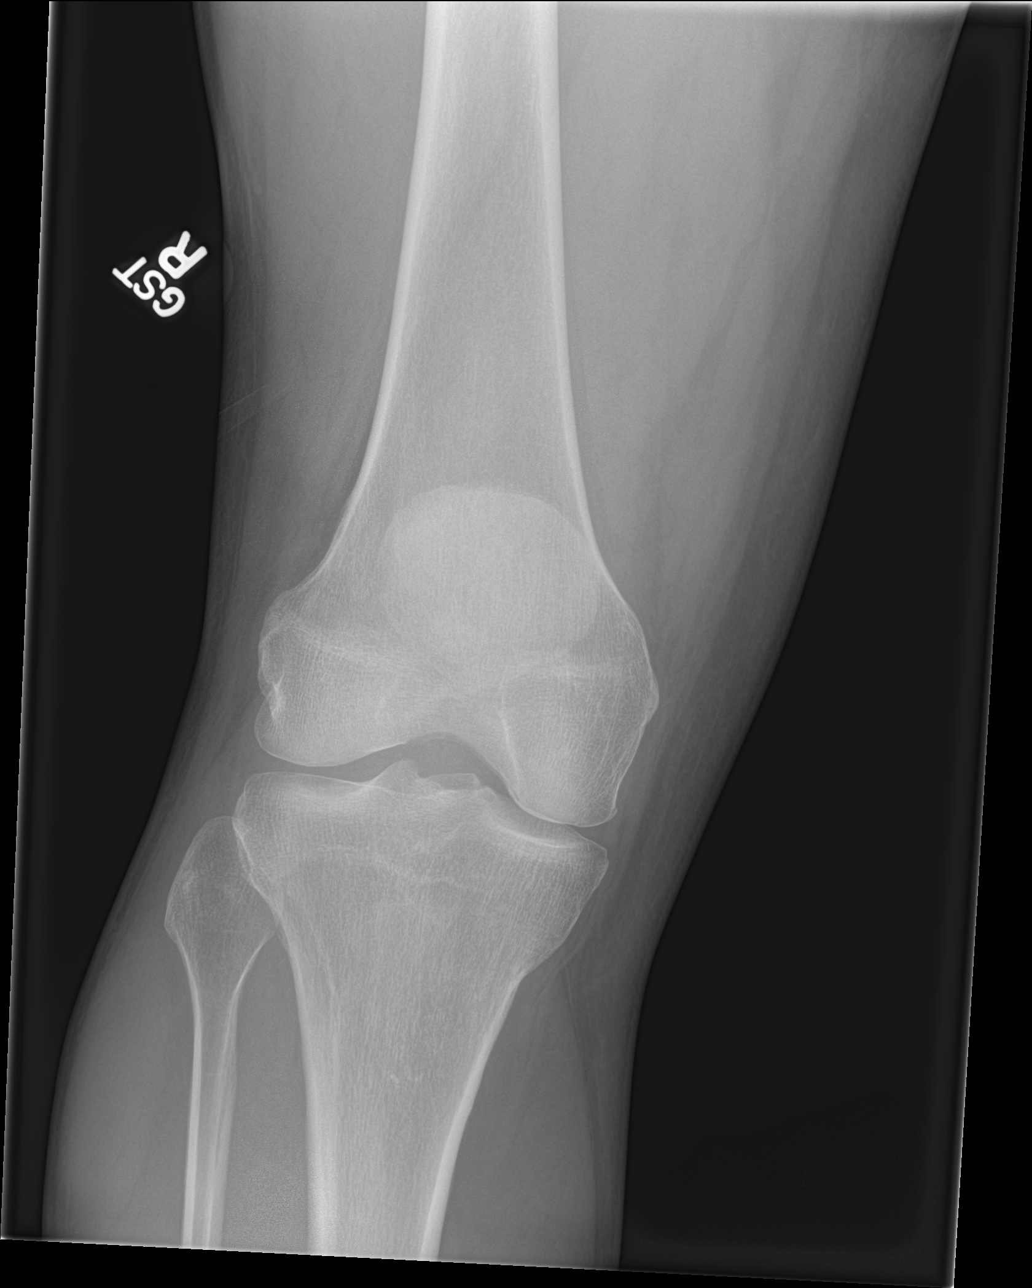

[tunnel]
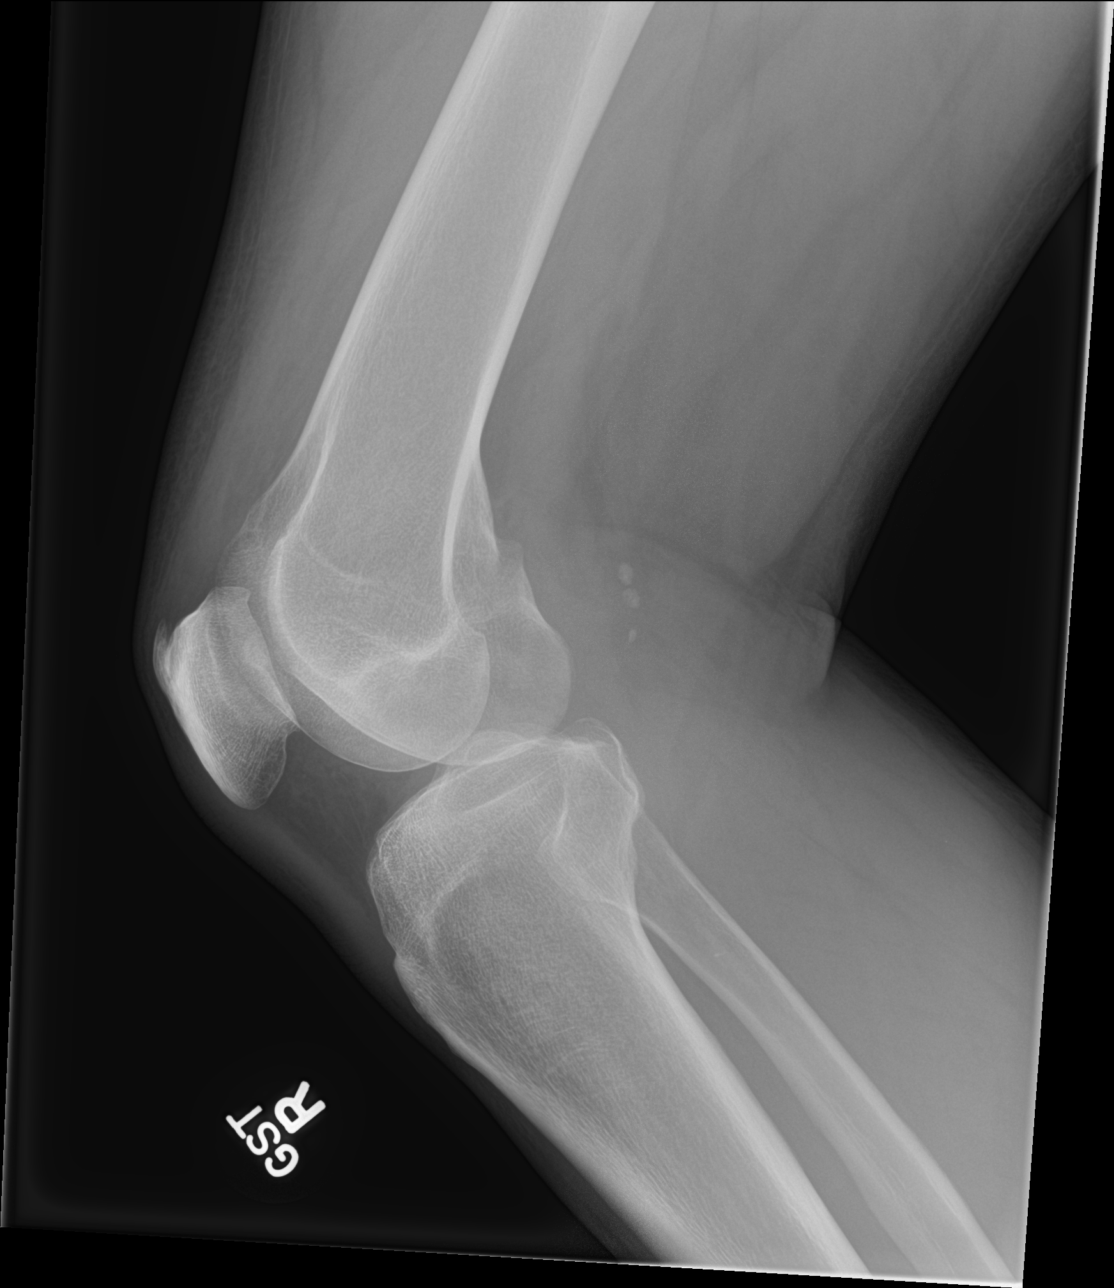

[knee obl (1 of 2)]
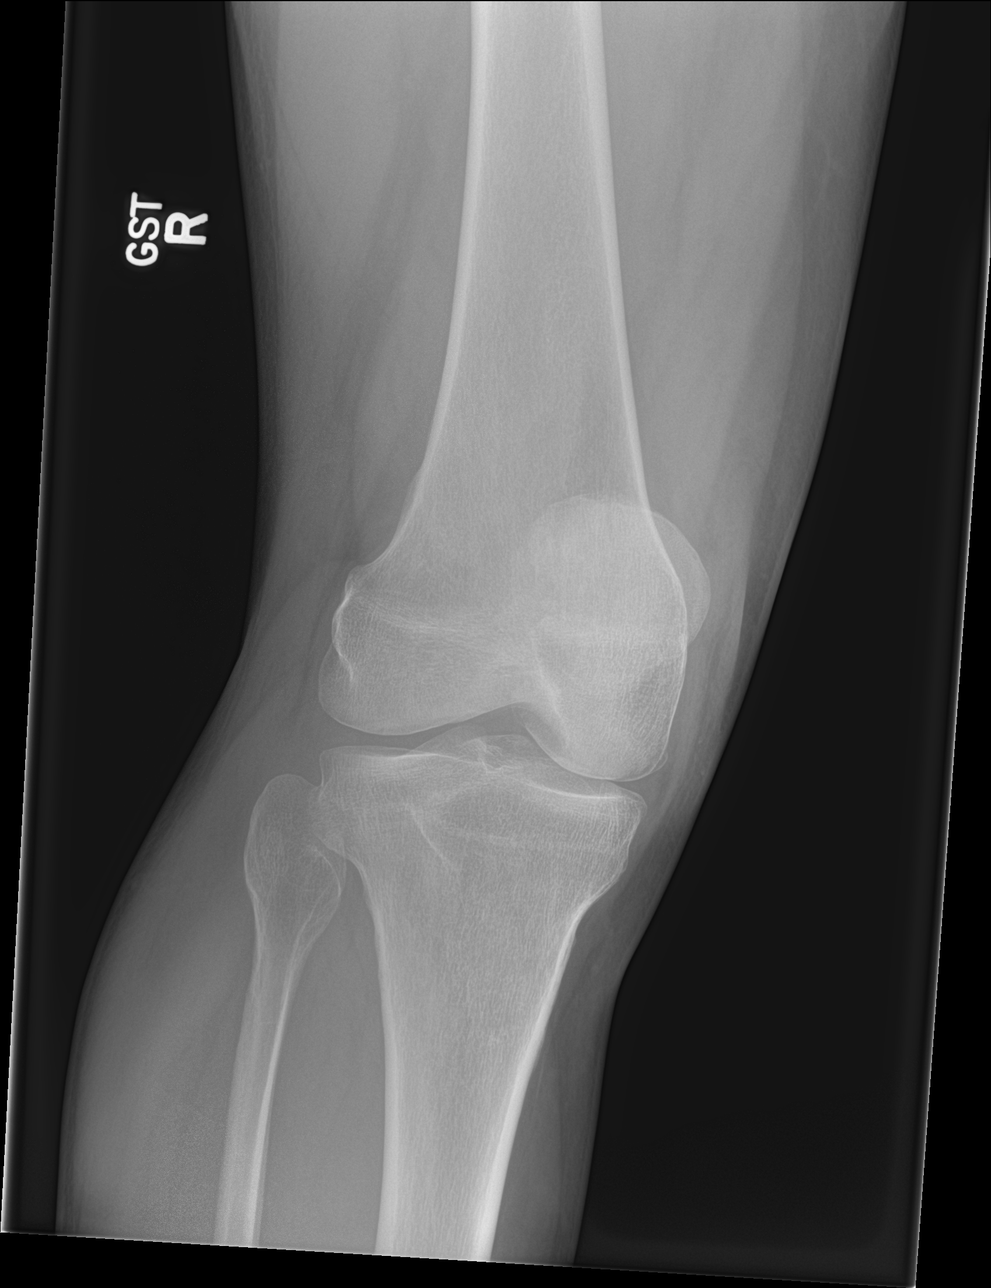

[knee obl (2 of 2)]
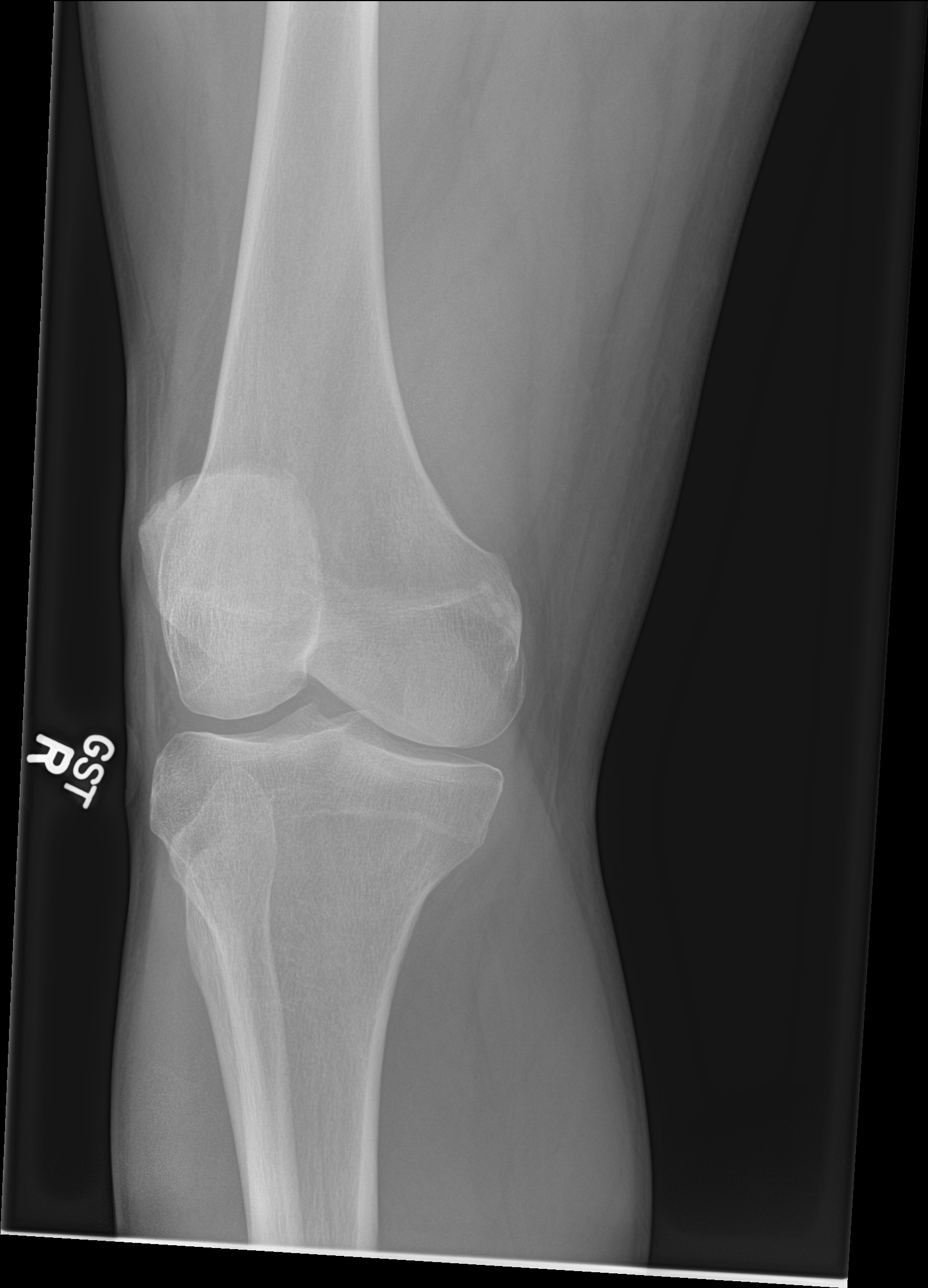

[4 of 4 positions shown; findings below may reference images not displayed]

FINDINGS: There is no evidence of fracture, dislocation, or joint effusion.
There is no evidence of arthropathy or other focal bone abnormality.
Soft tissues are unremarkable.
IMPRESSION: Negative.

## 2016-07-30 ENCOUNTER — Emergency Department (HOSPITAL_COMMUNITY): Payer: Medicare Other

## 2016-07-30 ENCOUNTER — Emergency Department (HOSPITAL_COMMUNITY)
Admission: EM | Admit: 2016-07-30 | Discharge: 2016-07-31 | Disposition: A | Discharge status: Home / Self Care | Payer: Medicare Other | Attending: Physician Assistant | Admitting: Physician Assistant

## 2016-07-30 ENCOUNTER — Encounter (HOSPITAL_COMMUNITY): Payer: Self-pay | Admitting: Emergency Medicine

## 2016-07-30 DIAGNOSIS — E1140 Type 2 diabetes mellitus with diabetic neuropathy, unspecified: Secondary | ICD-10-CM | POA: Diagnosis not present

## 2016-07-30 DIAGNOSIS — Z794 Long term (current) use of insulin: Secondary | ICD-10-CM | POA: Insufficient documentation

## 2016-07-30 DIAGNOSIS — J45909 Unspecified asthma, uncomplicated: Secondary | ICD-10-CM | POA: Insufficient documentation

## 2016-07-30 DIAGNOSIS — F17210 Nicotine dependence, cigarettes, uncomplicated: Secondary | ICD-10-CM | POA: Insufficient documentation

## 2016-07-30 DIAGNOSIS — R739 Hyperglycemia, unspecified: Secondary | ICD-10-CM

## 2016-07-30 DIAGNOSIS — R42 Dizziness and giddiness: Secondary | ICD-10-CM | POA: Insufficient documentation

## 2016-07-30 DIAGNOSIS — E1165 Type 2 diabetes mellitus with hyperglycemia: Secondary | ICD-10-CM | POA: Diagnosis not present

## 2016-07-30 DIAGNOSIS — Z8673 Personal history of transient ischemic attack (TIA), and cerebral infarction without residual deficits: Secondary | ICD-10-CM | POA: Insufficient documentation

## 2016-07-30 DIAGNOSIS — Z7982 Long term (current) use of aspirin: Secondary | ICD-10-CM | POA: Diagnosis not present

## 2016-07-30 DIAGNOSIS — R531 Weakness: Secondary | ICD-10-CM | POA: Diagnosis present

## 2016-07-30 LAB — URINALYSIS, ROUTINE W REFLEX MICROSCOPIC
BACTERIA UA: NONE SEEN
Bilirubin Urine: NEGATIVE
Glucose, UA: >=500 mg/dL — AB
Hgb urine dipstick: NEGATIVE
KETONES UR: NEGATIVE mg/dL
LEUKOCYTES UA: NEGATIVE
Nitrite: NEGATIVE
PROTEIN: NEGATIVE mg/dL
SQUAMOUS EPITHELIAL / LPF: NONE SEEN
Specific Gravity, Urine: 1.016 (ref 1.005–1.030)
WBC, UA: NONE SEEN WBC/hpf (ref 0–5)
pH: 5 (ref 5.0–8.0)

## 2016-07-30 LAB — BASIC METABOLIC PANEL
Anion gap: 11 (ref 5–15)
BUN: 7 mg/dL (ref 6–20)
CO2: 26 mmol/L (ref 22–32)
CREATININE: 0.77 mg/dL (ref 0.61–1.24)
Calcium: 9 mg/dL (ref 8.9–10.3)
Chloride: 99 mmol/L — ABNORMAL LOW (ref 101–111)
GFR calc Af Amer: >60 mL/min (ref >60)
GFR calc non Af Amer: >60 mL/min (ref >60)
Glucose, Bld: 354 mg/dL — ABNORMAL HIGH (ref 65–99)
POTASSIUM: 4.1 mmol/L (ref 3.5–5.1)
Sodium: 136 mmol/L (ref 135–145)

## 2016-07-30 LAB — CBC
HEMATOCRIT: 42.6 % (ref 39.0–52.0)
Hemoglobin: 15.3 g/dL (ref 13.0–17.0)
MCH: 32.4 pg (ref 26.0–34.0)
MCHC: 35.9 g/dL (ref 30.0–36.0)
MCV: 90.3 fL (ref 78.0–100.0)
PLATELETS: 127 10*3/uL — AB (ref 150–400)
RBC: 4.72 MIL/uL (ref 4.22–5.81)
RDW: 12.2 % (ref 11.5–15.5)
WBC: 7.4 10*3/uL (ref 4.0–10.5)

## 2016-07-30 LAB — I-STAT TROPONIN, ED: TROPONIN I, POC: 0 ng/mL (ref 0.00–0.08)

## 2016-07-30 LAB — CBG MONITORING, ED: GLUCOSE-CAPILLARY: 340 mg/dL — AB (ref 65–99)

## 2016-07-30 MED ORDER — SODIUM CHLORIDE 0.9 % IV BOLUS (SEPSIS)
1000.0000 mL | Freq: Once | INTRAVENOUS | Status: AC
  Administered 2016-07-30: 1000 mL via INTRAVENOUS

## 2016-07-30 MED ORDER — DIAZEPAM 5 MG PO TABS
5.0000 mg | ORAL_TABLET | Freq: Once | ORAL | Status: AC
  Administered 2016-07-30: 5 mg via ORAL
  Filled 2016-07-30: qty 1

## 2016-07-30 NOTE — ED Triage Notes (Signed)
Pt brought to ED by [facility] from [facility] for c/o generalized weakness, LSW today at lunch time, CBG 300 on EMS arrival to his house pt states he takes insulin at 2100 every day no missing dose. BP 140/90, HR 104 SPO2 98% RA

## 2016-07-30 NOTE — ED Notes (Signed)
Patient transported to CT 

## 2016-07-30 NOTE — ED Notes (Signed)
Patient transported to [name] 

## 2016-07-30 NOTE — ED Provider Notes (Signed)
[facility]-EMERGENCY DEPT Provider Note   CSN: [id number] Arrival date & time: 07/30/16  2016     History   Chief Complaint Chief Complaint  Patient presents with  . Weakness    HPI Miguel CoeGeorge L Ditommaso Jr. is a 46 y.o. male.  HPI Miguel CoeGeorge L Lawther Jr. is a 46 y.o. male with history of anxiety, depression, diabetes, SVT, neuropathy, history of CVA, blindness in left eye, presents to emergency complaining of ataxia, dizziness, generalized weakness. Patient states that symptoms started this afternoon after lunch. He reports that he feels like when he is walking he is going to the right. He states he tried to lay down to see if his symptoms would improve but it did not help. He states he has been able to eat dinner this evening, with no nausea or vomiting. He denies any headache. States he is having some mild pain in the posterior neck. Denies any nasal congestion, ear pain, tinnitus. Denies any abdominal pain. He states he has had some chest pain or shortness of breath, that comes and goes since yesterday. He states he feels generally weak. Denies any history of similar dizziness in the past.  Past Medical History:  Diagnosis Date  . Anxiety   . Asthma   . Blindness    bilateral  . Depression   . Diabetes mellitus without complication (HCC)   . Hypercholesteremia   . Neuropathy (HCC)   . PSVT (paroxysmal supraventricular tachycardia) (HCC) 06/2015  . Stroke (HCC) 02/2014   right sided weakness/numbness  . Vision loss of left eye     Patient Active Problem List   Diagnosis Date Noted  . Pain in the chest   . Type 2 diabetes mellitus without complication, with long-term current use of insulin (HCC)   . Hyperlipidemia   . Tobacco use disorder   . Chest pain 12/14/2015  . Episode of altered consciousness 01/15/2015  . Encephalopathy acute 12/18/2014  . Altered mental status   . LOC (loss of consciousness) (HCC)   . 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  Encephalopathy 08/01/2014  . Migraine 07/31/2014  . 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 Orthostatic hypotension 06/03/2014  . 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 Cerebral thrombosis with cerebral infarction (HCC) 05/20/2014  . Right sided weakness 05/19/2014  . Stroke (HCC)   . DM type 2 with diabetic peripheral neuropathy (HCC) 04/09/2014  . 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 Essential hypertension, benign 03/27/2014  . HLD (hyperlipidemia) 03/27/2014  . Type II or unspecified type diabetes mellitus with peripheral circulatory disorders, uncontrolled(250.72) 02/12/2014  . Tobacco abuse 02/12/2014  . CVA (cerebral infarction) 02/11/2014  . Syncope 01/25/2014  . Hip pain 01/25/2014  . SAH (subarachnoid hemorrhage) (HCC) 01/25/2014  . Vision, loss, sudden 01/25/2014  . Type II or unspecified type diabetes mellitus without mention of complication, uncontrolled 01/25/2014    Past Surgical History:  Procedure Laterality Date  . ABDOMINAL SURGERY    . EYE SURGERY Left   . HERNIA REPAIR         Home Medications    Prior to Admission medications   Medication Sig Start Date End Date Taking? Authorizing Provider  albuterol (PROVENTIL HFA;VENTOLIN HFA) 108 (90 BASE) MCG/ACT inhaler Inhale 2 puffs into the lungs every 6 (six) hours as needed for wheezing or shortness of breath.   Yes Historical Provider, MD  aspirin 325 MG tablet Take 1 tablet (  325 mg total) by mouth daily. 05/21/14  Yes Joseph Art, DO  atorvastatin (LIPITOR) 40 MG tablet Take 40 mg by mouth at bedtime.    Yes Historical Provider, MD  celecoxib (CELEBREX) 200 MG capsule Take 200 mg by mouth daily.   Yes Historical Provider, MD  FLUoxetine (PROZAC) 40 MG capsule Take 40 mg by mouth daily.   Yes Historical Provider, MD  fluticasone (FLONASE) 50 MCG/ACT nasal spray Place 1 spray into both nostrils daily as needed for allergies or  rhinitis.   Yes Historical Provider, MD  glipiZIDE (GLUCOTROL XL) 10 MG 24 hr tablet Take 10 mg by mouth daily with breakfast.   Yes Historical Provider, MD  ibuprofen (ADVIL,MOTRIN) 600 MG tablet Take 600 mg by mouth every 6 (six) hours as needed for moderate pain.   Yes Historical Provider, MD  insulin glargine (LANTUS) 100 UNIT/ML injection Inject 20 Units into the skin at bedtime.   Yes Historical Provider, MD  levETIRAcetam (KEPPRA) 1000 MG tablet Take 1 tablet (1,000 mg total) by mouth 2 (two) times daily. 02/04/15  Yes Lavera Guise, MD  metFORMIN (GLUCOPHAGE-XR) 500 MG 24 hr tablet Take 1,000 mg by mouth 2 (two) times daily.   Yes Historical Provider, MD  metoprolol succinate (TOPROL XL) 25 MG 24 hr tablet Take 1 tablet (25 mg total) by mouth daily. 08/18/15  Yes Cassell Clement, MD  Multiple Vitamins-Minerals (MULTIVITAMIN WITH MINERALS) tablet Take 1 tablet by mouth daily.   Yes Historical Provider, MD  pregabalin (LYRICA) 150 MG capsule Take 1 capsule (150 mg total) by mouth 2 (two) times daily. 12/19/14  Yes Vassie Loll, MD  HYDROcodone-acetaminophen (NORCO/VICODIN) 5-325 MG tablet Take 1 tablet by mouth every 6 (six) hours as needed. Patient not taking: Reported on 07/30/2016 02/19/16   Shon Baton, MD    Family History Family History  Problem Relation Age of Onset  . High blood pressure Mother   . Diabetes Mellitus I Mother   . High blood pressure Father   . Diabetes Mellitus I Father   . Migraines Daughter   . Parkinsonism Daughter   . Parkinsonism Son   . High blood pressure Sister   . High blood pressure Paternal Grandfather     Social History Social History  Substance Use Topics  . Smoking status: Current Every Day Smoker    Packs/day: 0.00    Types: Cigarettes, E-cigarettes  . Smokeless tobacco: Never Used     Comment: he does electonic cigarettes sometmes too  . Alcohol use 0.0 oz/week     Allergies   Penicillins and Toradol [ketorolac  tromethamine]   Review of Systems Review of Systems  Constitutional: Negative for chills and fever.  Respiratory: Positive for chest tightness. Negative for cough and shortness of breath.   Cardiovascular: Positive for chest pain. Negative for palpitations and leg swelling.  Gastrointestinal: Negative for abdominal distention, abdominal pain, diarrhea, nausea and vomiting.  Genitourinary: Negative for dysuria, frequency, hematuria and urgency.  Musculoskeletal: Positive for neck pain. Negative for arthralgias, myalgias and neck stiffness.  Skin: Negative for rash.  Allergic/Immunologic: Negative for immunocompromised state.  Neurological: Positive for dizziness, weakness and light-headedness. Negative for numbness and headaches.  All other systems reviewed and are negative.    Physical Exam Updated Vital Signs BP 110/78   Pulse 94   Temp 97.9 F (36.6 C) (Oral)   Resp 16   Ht 5\' 8"  (1.727 m)   Wt 88.9 kg   SpO2 97%  BMI 29.80 kg/m   Physical Exam  Constitutional: He is oriented to person, place, and time. He appears well-developed and well-nourished. No distress.  HENT:  Head: Normocephalic and atraumatic.  Eyes: Conjunctivae and EOM are normal. Pupils are equal, round, and reactive to light.  Neck: Neck supple.  Cardiovascular: Normal rate, regular rhythm and normal heart sounds.   Pulmonary/Chest: Effort normal. No respiratory distress. He has no wheezes. He has no rales.  Abdominal: Soft. Bowel sounds are normal. He exhibits no distension. There is no tenderness. There is no rebound.  Musculoskeletal: He exhibits no edema.  Neurological: He is alert and oriented to person, place, and time.  5/5 and equal upper and lower extremity strength bilaterally. Equal grip strength bilaterally. Normal finger to nose and heel to shin. No pronator drift. Patellar reflexes 2+   Skin: Skin is warm and dry.  Nursing note and vitals reviewed.    ED Treatments / Results   Labs (all labs ordered are listed, but only abnormal results are displayed) Labs Reviewed  BASIC METABOLIC PANEL - Abnormal; Notable for the following:       Result Value   Chloride 99 (*)    Glucose, Bld 354 (*)    All other components within normal limits  CBC - Abnormal; Notable for the following:    Platelets 127 (*)    All other components within normal limits  URINALYSIS, ROUTINE W REFLEX MICROSCOPIC - Abnormal; Notable for the following:    Color, Urine STRAW (*)    Glucose, UA >=500 (*)    All other components within normal limits  CBG MONITORING, ED - Abnormal; Notable for the following:    Glucose-Capillary 340 (*)    All other components within normal limits  I-STAT TROPOININ, ED    EKG  EKG Interpretation  Date/Time:  Friday July 30 2016 20:24:02 EST Ventricular Rate:  104 PR Interval:    QRS Duration: 78 QT Interval:  340 QTC Calculation: 448 R Axis:   80 Text Interpretation:  Sinus tachycardia Borderline T abnormalities, inferior leads Baseline wander in lead(s) V3 When compared with ECG of 03/04/2016, No significant change was found Confirmed by [facility]  MD, DAVID ([id number]) on 07/30/2016 11:11:07 PM       Radiology Ct Head Wo Contrast  Result Date: 07/30/2016 CLINICAL DATA:  46 y/o M; dizziness, ataxia, and generalized body weakness. EXAM: CT HEAD WITHOUT CONTRAST TECHNIQUE: Contiguous axial images were obtained from the base of the skull through the vertex without intravenous contrast. COMPARISON:  02/04/2015 CT head.  12/18/2014 [name] of the head. FINDINGS: Brain: No evidence of acute infarction, hemorrhage, hydrocephalus, extra-axial collection or mass lesion/mass effect. Several nonspecific foci of hypoattenuation in subcortical and periventricular white matter, probably representing chronic microvascular ischemic changes. There is mild parenchymal volume loss of the brain. Vascular: No hyperdense vessel or unexpected calcification. Skull: Normal. Negative for  fracture or focal lesion. Sinuses/Orbits: Left maxillary sinus mucous retention cyst. Otherwise negative. Other: Right posterior temporal and left occipital scalp lipoma is are stable. IMPRESSION: 1. No acute intracranial abnormality identified. If symptoms persist or if clinically indicated [name] is more sensitive. 2. Nonspecific foci of white matter hypoattenuation, probably moderate chronic microvascular ischemic changes and mild brain parenchymal volume loss. Electronically Signed   By: Mitzi Hansen M.D.   On: 07/30/2016 21:34   Mr Brain Wo Contrast  Result Date: 07/31/2016 CLINICAL DATA:  Initial evaluation for generalized weakness. EXAM: [name] HEAD WITHOUT CONTRAST TECHNIQUE: Multiplanar, multiecho pulse sequences of  the brain and surrounding structures were obtained without intravenous contrast. COMPARISON:  Comparison made with prior CT from earlier the same day. FINDINGS: Brain: Cerebral volume within normal limits for age. Patchy T2/FLAIR hyperintensity within the periventricular and deep white matter both cerebral hemispheres, most consistent with chronic small vessel ischemic disease, moderate for age. No abnormal foci of restricted diffusion to suggest acute or subacute ischemia. Gray-white matter differentiation maintained. No evidence for chronic infarction identified. No evidence for acute or chronic intracranial hemorrhage. No mass lesion, midline shift or mass effect. No hydrocephalus. No extra-axial fluid collection. Major dural sinuses are grossly patent. Pituitary gland and suprasellar region within normal limits. Midline structures intact. Vascular: Major intracranial vascular flow voids are maintained. The left vertebral artery is diminutive, suspected to terminate in PICA. Skull and upper cervical spine: Craniocervical junction normal. Visualized upper cervical spine unremarkable. Bone marrow signal intensity normal. Scattered T2/T1 hyperintense lesions within the posterior scalp  noted, suspected to reflect small sebaceous cyst. Scalp soft tissues otherwise unremarkable. Sinuses/Orbits: Globes and orbital soft tissues within normal limits. Axial myopia noted. Mild scattered mucosal thickening throughout the paranasal sinuses. Retention cyst within the left maxillary and right sphenoid sinus. No mastoid effusion. Inner ear structures grossly unremarkable. IMPRESSION: 1. No acute intracranial process identified. 2. Moderate for age chronic microvascular ischemic changes. Electronically Signed   By: Rise Mu M.D.   On: 07/31/2016 00:43    Procedures Procedures (including critical care time)  Medications Ordered in ED Medications - No data to display   Initial Impression / Assessment and Plan / ED Course  I have reviewed the triage vital signs and the nursing notes.  Pertinent labs & imaging results that were available during my care of the patient were reviewed by me and considered in my medical decision making (see chart for details).    Not orthostatic. Labs unremarkable other than hyperglycemia, no DKA. Fluids running.   CT negative. Discussed with Dr. Charlotte Sanes. Will get [name] to rule out central cause for his vertigo. Valium and fluids provided to symptom control.   1:03 AM [name] is negative. Pt ambulated with his cane with no difficulty. No chest pain.  Normal VS. Pt non toxic. No distress. Home with PCP follow up. Will give meclizine for vertigo like symptoms. Most likely peripheral vertigo.   Vitals:   07/30/16 2230 07/30/16 2300 07/31/16 0100 07/31/16 0119  BP: 114/86 111/83 113/85   Pulse: 88 90 90   Resp: 15 17    Temp:    98.3 F (36.8 C)  TempSrc:    Oral  SpO2: 95% 95% 96%   Weight:      Height:         Final Clinical Impressions(s) / ED Diagnoses   Final diagnoses:  Vertigo  Hyperglycemia    New Prescriptions Discharge Medication List as of 07/31/2016  1:03 AM    START taking these medications   Details  meclizine (ANTIVERT)  25 MG tablet Take 1 tablet (25 mg total) by mouth 3 (three) times daily as needed for dizziness., Starting Sat 07/31/2016, Print         Jaynie Crumble, PA-C 07/31/16 2215    Courteney Randall An, MD 08/01/16 1625

## 2016-07-31 DIAGNOSIS — E1165 Type 2 diabetes mellitus with hyperglycemia: Secondary | ICD-10-CM | POA: Diagnosis not present

## 2016-07-31 MED ORDER — MECLIZINE HCL 25 MG PO TABS: 25.0000 mg | ORAL_TABLET | Freq: Three times a day (TID) | ORAL | 0 refills | Status: DC | PRN

## 2016-07-31 NOTE — ED Notes (Signed)
Pt walked on the hallway with slow steady gait using his cane, no acute distress noticed. Pt still c/o dizziness while walking PA notified.

## 2016-07-31 NOTE — Discharge Instructions (Signed)
Your [name] today is normal. Take meclizine as prescribed for dizziness. Drink plenty of fluids. Make sure to take your medications. Follow up with your doctor on monday. Return if worsening.

## 2016-11-14 ENCOUNTER — Emergency Department (HOSPITAL_COMMUNITY): Payer: Medicare Other

## 2016-11-14 ENCOUNTER — Encounter (HOSPITAL_COMMUNITY): Payer: Self-pay | Admitting: Emergency Medicine

## 2016-11-14 ENCOUNTER — Inpatient Hospital Stay (HOSPITAL_COMMUNITY)
Admission: EM | Admit: 2016-11-14 | Discharge: 2016-11-16 | DRG: 313 | Disposition: A | Discharge status: Home / Self Care | Payer: Medicare Other | Attending: Internal Medicine | Admitting: Internal Medicine

## 2016-11-14 DIAGNOSIS — E785 Hyperlipidemia, unspecified: Secondary | ICD-10-CM | POA: Diagnosis present

## 2016-11-14 DIAGNOSIS — Z7984 Long term (current) use of oral hypoglycemic drugs: Secondary | ICD-10-CM

## 2016-11-14 DIAGNOSIS — D696 Thrombocytopenia, unspecified: Secondary | ICD-10-CM | POA: Diagnosis present

## 2016-11-14 DIAGNOSIS — E871 Hypo-osmolality and hyponatremia: Secondary | ICD-10-CM | POA: Diagnosis present

## 2016-11-14 DIAGNOSIS — I10 Essential (primary) hypertension: Secondary | ICD-10-CM | POA: Diagnosis present

## 2016-11-14 DIAGNOSIS — F329 Major depressive disorder, single episode, unspecified: Secondary | ICD-10-CM | POA: Diagnosis present

## 2016-11-14 DIAGNOSIS — R079 Chest pain, unspecified: Secondary | ICD-10-CM | POA: Diagnosis present

## 2016-11-14 DIAGNOSIS — F17210 Nicotine dependence, cigarettes, uncomplicated: Secondary | ICD-10-CM | POA: Diagnosis present

## 2016-11-14 DIAGNOSIS — E1165 Type 2 diabetes mellitus with hyperglycemia: Secondary | ICD-10-CM | POA: Diagnosis present

## 2016-11-14 DIAGNOSIS — Z720 Tobacco use: Secondary | ICD-10-CM | POA: Diagnosis present

## 2016-11-14 DIAGNOSIS — R0789 Other chest pain: Secondary | ICD-10-CM | POA: Diagnosis not present

## 2016-11-14 DIAGNOSIS — E876 Hypokalemia: Secondary | ICD-10-CM | POA: Diagnosis not present

## 2016-11-14 DIAGNOSIS — H5462 Unqualified visual loss, left eye, normal vision right eye: Secondary | ICD-10-CM | POA: Diagnosis present

## 2016-11-14 DIAGNOSIS — Z7982 Long term (current) use of aspirin: Secondary | ICD-10-CM

## 2016-11-14 DIAGNOSIS — E1142 Type 2 diabetes mellitus with diabetic polyneuropathy: Secondary | ICD-10-CM | POA: Diagnosis present

## 2016-11-14 DIAGNOSIS — Z880 Allergy status to penicillin: Secondary | ICD-10-CM

## 2016-11-14 DIAGNOSIS — Z79899 Other long term (current) drug therapy: Secondary | ICD-10-CM

## 2016-11-14 DIAGNOSIS — Z888 Allergy status to other drugs, medicaments and biological substances status: Secondary | ICD-10-CM

## 2016-11-14 DIAGNOSIS — G40909 Epilepsy, unspecified, not intractable, without status epilepticus: Secondary | ICD-10-CM | POA: Diagnosis present

## 2016-11-14 DIAGNOSIS — F419 Anxiety disorder, unspecified: Secondary | ICD-10-CM | POA: Diagnosis present

## 2016-11-14 DIAGNOSIS — R739 Hyperglycemia, unspecified: Secondary | ICD-10-CM

## 2016-11-14 DIAGNOSIS — Z794 Long term (current) use of insulin: Secondary | ICD-10-CM

## 2016-11-14 DIAGNOSIS — Z8673 Personal history of transient ischemic attack (TIA), and cerebral infarction without residual deficits: Secondary | ICD-10-CM

## 2016-11-14 DIAGNOSIS — Z8249 Family history of ischemic heart disease and other diseases of the circulatory system: Secondary | ICD-10-CM

## 2016-11-14 MED ORDER — MORPHINE SULFATE (PF) 4 MG/ML IV SOLN
4.0000 mg | Freq: Once | INTRAVENOUS | Status: AC
  Administered 2016-11-14: 4 mg via INTRAVENOUS
  Filled 2016-11-14: qty 1

## 2016-11-14 MED ORDER — MORPHINE SULFATE (PF) 4 MG/ML IV SOLN
4.0000 mg | Freq: Once | INTRAVENOUS | Status: AC
Start: 2016-11-14 — End: 2016-11-15
  Administered 2016-11-15: 4 mg via INTRAVENOUS
  Filled 2016-11-14: qty 1

## 2016-11-14 NOTE — ED Triage Notes (Signed)
Pt brought to ED by [facility] from home for c/o 510 left side cp and back neck pain that started while watching TV at home, pt denies any nausea or vomiting no SOB. ST on EMS monitor with HR 130, BP 11378, SPO 95%. Pt got one nitro by EMS with no relief and 324 mg Aspirin prior to EMS arrived to his house.

## 2016-11-14 NOTE — ED Notes (Signed)
Patient transported to X-ray 

## 2016-11-14 NOTE — ED Provider Notes (Signed)
[facility]-EMERGENCY DEPT Provider Note   CSN: [id number] Arrival date & time: 11/14/16  2230  By signing my name below, I, Cynda AcresHailei Fulton, attest that this documentation has been prepared under the direction and in the presence of Dione BoozeGlick, Gabrelle Roca, MD. Electronically Signed: Cynda AcresHailei Fulton, Scribe. 11/14/16. 11:20 PM.  History   Chief Complaint Chief Complaint  Patient presents with  . Chest Pain    HPI Comments: Miguel CoeGeorge L Steinhilber Jr. is a 46 y.o. male with a history of HTN, PSVT, DM, and a stroke, who presents to the Emergency Department complaining of sudden-onset, constant left-sided chest pain radiating to the neck that began earlier tonight at 7:30 pm. Patient states he was watching television, when his chest pain began. Chest pain at present. Patient reports having an unremarkable stress test recently. Patient reports associated headache and dizziness. No relief with aspirin or nitroglycerin. Patient describes his pain as someone "sitting" on his chest, with a severity of 4.5/10. Nothing improves or worsens his pain. Patient is a current everyday smoker of 1 pack a day. Patient reports a family history of CHF (sister) and CABG (father). Patient denies any shortness of breath, diaphoresis, nausea, vomiting, fever, or chills. Patient denies any history of PE or recent travel.   The history is provided by the patient. No language interpreter was used.    Past Medical History:  Diagnosis Date  . Anxiety   . Asthma   . Blindness    bilateral  . Depression   . Diabetes mellitus without complication (HCC)   . Hypercholesteremia   . Neuropathy   . PSVT (paroxysmal supraventricular tachycardia) (HCC) 06/2015  . Stroke (HCC) 02/2014   right sided weakness/numbness  . Vision loss of left eye     Patient Active Problem List   Diagnosis Date Noted  . Pain in the chest   . Type 2 diabetes mellitus without complication, with long-term current use of insulin (HCC)   . Hyperlipidemia   . Tobacco  use disorder   . Chest pain 12/14/2015  . Episode of altered consciousness 01/15/2015  . Encephalopathy acute 12/18/2014  . Altered mental status   . LOC (loss of consciousness) (HCC)   . 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 Encephalopathy 08/01/2014  . Migraine 07/31/2014  . 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 Orthostatic hypotension 06/03/2014  . 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 Cerebral thrombosis with cerebral infarction (HCC) 05/20/2014  . Right sided weakness 05/19/2014  . Stroke (HCC)   . DM type 2 with diabetic peripheral neuropathy (HCC) 04/09/2014  . 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 Essential hypertension, benign 03/27/2014  . HLD (hyperlipidemia) 03/27/2014  . Type II or unspecified type diabetes mellitus with peripheral circulatory disorders, uncontrolled(250.72) 02/12/2014  . Tobacco abuse 02/12/2014  . CVA (cerebral infarction) 02/11/2014  . Syncope 01/25/2014  . Hip pain 01/25/2014  . SAH (subarachnoid hemorrhage) (HCC) 01/25/2014  . Vision, loss, sudden 01/25/2014  . Type II or unspecified type diabetes mellitus without mention of complication, uncontrolled 01/25/2014    Past Surgical History:  Procedure Laterality Date  . ABDOMINAL SURGERY    . EYE SURGERY Left   . HERNIA REPAIR         Home Medications    Prior to Admission medications   Medication Sig Start Date End Date Taking? Authorizing Provider  albuterol (PROVENTIL HFA;VENTOLIN HFA) 108 (90 BASE) MCG/ACT inhaler Inhale 2 puffs into  the lungs every 6 (six) hours as needed for wheezing or shortness of breath.    [provider]  aspirin 325 MG tablet Take 1 tablet (325 mg total) by mouth daily. 05/21/14   Joseph Art, DO  atorvastatin (LIPITOR) 40 MG tablet Take 40 mg by mouth at bedtime.     [provider]  celecoxib (CELEBREX) 200 MG capsule Take  200 mg by mouth daily.    [provider]  FLUoxetine (PROZAC) 40 MG capsule Take 40 mg by mouth daily.    [provider]  fluticasone (FLONASE) 50 MCG/ACT nasal spray Place 1 spray into both nostrils daily as needed for allergies or rhinitis.    [provider]  glipiZIDE (GLUCOTROL XL) 10 MG 24 hr tablet Take 10 mg by mouth daily with breakfast.    [provider]  HYDROcodone-acetaminophen (NORCO/VICODIN) 5-325 MG tablet Take 1 tablet by mouth every 6 (six) hours as needed. Patient not taking: Reported on 07/30/2016 02/19/16   Horton, Mayer Masker, MD  ibuprofen (ADVIL,MOTRIN) 600 MG tablet Take 600 mg by mouth every 6 (six) hours as needed for moderate pain.    [provider]  insulin glargine (LANTUS) 100 UNIT/ML injection Inject 20 Units into the skin at bedtime.    [provider]  levETIRAcetam (KEPPRA) 1000 MG tablet Take 1 tablet (1,000 mg total) by mouth 2 (two) times daily. 02/04/15   Lavera Guise, MD  meclizine (ANTIVERT) 25 MG tablet Take 1 tablet (25 mg total) by mouth 3 (three) times daily as needed for dizziness. 07/31/16   Kirichenko, Lemont Fillers, PA-C  metFORMIN (GLUCOPHAGE-XR) 500 MG 24 hr tablet Take 1,000 mg by mouth 2 (two) times daily.    [provider]  metoprolol succinate (TOPROL XL) 25 MG 24 hr tablet Take 1 tablet (25 mg total) by mouth daily. 08/18/15   Cassell Clement, MD  Multiple Vitamins-Minerals (MULTIVITAMIN WITH MINERALS) tablet Take 1 tablet by mouth daily.    [provider]  pregabalin (LYRICA) 150 MG capsule Take 1 capsule (150 mg total) by mouth 2 (two) times daily. 12/19/14   Vassie Loll, MD    Family History Family History  Problem Relation Age of Onset  . High blood pressure Mother   . Diabetes Mellitus I Mother   . High blood pressure Father   . Diabetes Mellitus I Father   . Migraines Daughter   . Parkinsonism Daughter   . Parkinsonism Son   . High blood pressure Sister   .  High blood pressure Paternal Grandfather     Social History Social History  Substance Use Topics  . Smoking status: Current Every Day Smoker    Packs/day: 0.00    Types: Cigarettes, E-cigarettes  . Smokeless tobacco: Never Used     Comment: he does electonic cigarettes sometmes too  . Alcohol use 0.0 oz/week     Allergies   Penicillins and Toradol [ketorolac tromethamine]   Review of Systems Review of Systems  Constitutional: Negative for chills, diaphoresis and fever.  Respiratory: Negative for shortness of breath.   Cardiovascular: Positive for chest pain.  Gastrointestinal: Negative for nausea and vomiting.  Musculoskeletal: Positive for neck pain.  Neurological: Positive for dizziness and headaches.  All other systems reviewed and are negative.    Physical Exam Updated Vital Signs BP (!) 119/96 (BP Location: Right Arm)   Pulse (!) 114   Temp 98.7 F (37.1 C) (Oral)   Resp 20   Ht 5\' 8"  (  1.727 m)   Wt 196 lb (88.9 kg)   SpO2 97%   BMI 29.80 kg/m   Physical Exam  Constitutional: He is oriented to person, place, and time. He appears well-developed and well-nourished.  HENT:  Head: Normocephalic and atraumatic.  Eyes: EOM are normal. Pupils are equal, round, and reactive to light.  Neck: Normal range of motion. Neck supple. No JVD present.  Cardiovascular: Normal rate, regular rhythm and normal heart sounds.   No murmur heard. Pulmonary/Chest: Effort normal and breath sounds normal. He has no wheezes. He has no rales. He exhibits no tenderness.  Mild tenderness left anterior chest wall.   Abdominal: Soft. Bowel sounds are normal. He exhibits no distension and no mass. There is no tenderness.  Musculoskeletal: Normal range of motion. He exhibits no edema or tenderness.  Lymphadenopathy:    He has no cervical adenopathy.  Neurological: He is alert and oriented to person, place, and time. No cranial nerve deficit. He exhibits normal muscle tone. Coordination  normal.  Skin: Skin is warm and dry. No rash noted.  Psychiatric: He has a normal mood and affect. His behavior is normal. Judgment and thought content normal.  Nursing note and vitals reviewed.    ED Treatments / Results  DIAGNOSTIC STUDIES: Oxygen Saturation is 97% on RA, normal by my interpretation.    COORDINATION OF CARE: 11:19 PM Discussed treatment plan with pt at bedside and pt agreed to plan, which includes pain medication and observation overnight.   Labs (all labs ordered are listed, but only abnormal results are displayed) Labs Reviewed  BASIC METABOLIC PANEL - Abnormal; Notable for the following:       Result Value   Sodium 132 (*)    Potassium 3.3 (*)    Chloride 99 (*)    Glucose, Bld 523 (*)    BUN 5 (*)    Calcium 8.8 (*)    All other components within normal limits  CBC - Abnormal; Notable for the following:    Platelets 116 (*)    All other components within normal limits  I-STAT TROPOININ, ED    EKG  EKG Interpretation  Date/Time:  Sunday Nov 14 2016 22:42:16 EDT Ventricular Rate:  109 PR Interval:    QRS Duration: 76 QT Interval:  321 QTC Calculation: 433 R Axis:   17 Text Interpretation:  Sinus tachycardia Probable left atrial enlargement Borderline T abnormalities, inferior leads When compared with ECG of 07/30/2016, No significant change was found Confirmed by Dione Booze ([id number]) on 11/14/2016 10:49:58 PM       Radiology Dg Chest 2 View  Result Date: 11/14/2016 CLINICAL DATA:  Left-sided chest and back pain today. EXAM: CHEST  2 VIEW COMPARISON:  Radiographs and CT 03/04/2017 FINDINGS: The cardiomediastinal contours are normal. The lungs are clear. Pulmonary vasculature is normal. No consolidation, pleural effusion, or pneumothorax. No acute osseous abnormalities are seen. IMPRESSION: No acute abnormality. Electronically Signed   By: Rubye Oaks M.D.   On: 11/14/2016 23:12    Procedures Procedures (including critical care  time)  Medications Ordered in ED Medications  morphine 4 MG/ML injection 4 mg (4 mg Intravenous Given 11/14/16 2328)  morphine 4 MG/ML injection 4 mg (4 mg Intravenous Given 11/15/16 0024)  potassium chloride SA (K-DUR,KLOR-CON) CR tablet 40 mEq (40 mEq Oral Given 11/15/16 0030)  insulin aspart (novoLOG) injection 10 Units (10 Units Intravenous Given 11/15/16 0030)     Initial Impression / Assessment and Plan / ED Course  I have  reviewed the triage vital signs and the nursing notes.  Pertinent labs & imaging results that were available during my care of the patient were reviewed by me and considered in my medical decision making (see chart for details).  Chest pain with typical and atypical features. Old records are reviewed, and he has been admitted for chest pain evaluation. Myoview stress test in June 2017 was low risk study. However, patient has multiple risk factors including very strong family history. Heart score equals 4. He will need to be admitted for serial troponins. He is given morphine for pain relief.  He did require a second injection of morphine. Troponin is normal, but he is noted to have significant hyperglycemia with glucose 523. Mild hyponatremia and hypokalemia are present. Hyponatremia is probably secondary to hyperglycemia. He is given a dose of oral potassium. Case is discussed with Dr. Katrinka Blazing [facility] who agrees to admit the patient.  Final Clinical Impressions(s) / ED Diagnoses   Final diagnoses:  Chest pain, unspecified type  Hyperglycemia  Hypokalemia  Hyponatremia    New Prescriptions New Prescriptions   No medications on file   I personally performed the services described in this documentation, which was scribed in my presence. The recorded information has been reviewed and is accurate.       Dione Booze, MD 11/15/16 [phone]

## 2016-11-15 ENCOUNTER — Observation Stay (HOSPITAL_BASED_OUTPATIENT_CLINIC_OR_DEPARTMENT_OTHER): Payer: Medicare Other

## 2016-11-15 ENCOUNTER — Observation Stay (HOSPITAL_COMMUNITY): Payer: Medicare Other

## 2016-11-15 ENCOUNTER — Encounter (HOSPITAL_COMMUNITY): Payer: Self-pay

## 2016-11-15 DIAGNOSIS — E1165 Type 2 diabetes mellitus with hyperglycemia: Secondary | ICD-10-CM | POA: Diagnosis present

## 2016-11-15 DIAGNOSIS — F329 Major depressive disorder, single episode, unspecified: Secondary | ICD-10-CM | POA: Diagnosis present

## 2016-11-15 DIAGNOSIS — Z79899 Other long term (current) drug therapy: Secondary | ICD-10-CM | POA: Diagnosis not present

## 2016-11-15 DIAGNOSIS — R079 Chest pain, unspecified: Secondary | ICD-10-CM

## 2016-11-15 DIAGNOSIS — G40909 Epilepsy, unspecified, not intractable, without status epilepticus: Secondary | ICD-10-CM | POA: Diagnosis present

## 2016-11-15 DIAGNOSIS — Z8673 Personal history of transient ischemic attack (TIA), and cerebral infarction without residual deficits: Secondary | ICD-10-CM | POA: Diagnosis not present

## 2016-11-15 DIAGNOSIS — E785 Hyperlipidemia, unspecified: Secondary | ICD-10-CM | POA: Diagnosis present

## 2016-11-15 DIAGNOSIS — R0789 Other chest pain: Secondary | ICD-10-CM | POA: Diagnosis present

## 2016-11-15 DIAGNOSIS — Z720 Tobacco use: Secondary | ICD-10-CM

## 2016-11-15 DIAGNOSIS — E1142 Type 2 diabetes mellitus with diabetic polyneuropathy: Secondary | ICD-10-CM | POA: Diagnosis present

## 2016-11-15 DIAGNOSIS — R739 Hyperglycemia, unspecified: Secondary | ICD-10-CM

## 2016-11-15 DIAGNOSIS — F17210 Nicotine dependence, cigarettes, uncomplicated: Secondary | ICD-10-CM | POA: Diagnosis present

## 2016-11-15 DIAGNOSIS — Z8249 Family history of ischemic heart disease and other diseases of the circulatory system: Secondary | ICD-10-CM | POA: Diagnosis not present

## 2016-11-15 DIAGNOSIS — Z888 Allergy status to other drugs, medicaments and biological substances status: Secondary | ICD-10-CM | POA: Diagnosis not present

## 2016-11-15 DIAGNOSIS — R072 Precordial pain: Secondary | ICD-10-CM | POA: Diagnosis not present

## 2016-11-15 DIAGNOSIS — F419 Anxiety disorder, unspecified: Secondary | ICD-10-CM | POA: Diagnosis present

## 2016-11-15 DIAGNOSIS — E876 Hypokalemia: Secondary | ICD-10-CM

## 2016-11-15 DIAGNOSIS — Z7984 Long term (current) use of oral hypoglycemic drugs: Secondary | ICD-10-CM | POA: Diagnosis not present

## 2016-11-15 DIAGNOSIS — Z880 Allergy status to penicillin: Secondary | ICD-10-CM | POA: Diagnosis not present

## 2016-11-15 DIAGNOSIS — H5462 Unqualified visual loss, left eye, normal vision right eye: Secondary | ICD-10-CM | POA: Diagnosis present

## 2016-11-15 DIAGNOSIS — Z7982 Long term (current) use of aspirin: Secondary | ICD-10-CM | POA: Diagnosis not present

## 2016-11-15 DIAGNOSIS — D696 Thrombocytopenia, unspecified: Secondary | ICD-10-CM | POA: Diagnosis present

## 2016-11-15 DIAGNOSIS — E871 Hypo-osmolality and hyponatremia: Secondary | ICD-10-CM | POA: Diagnosis present

## 2016-11-15 DIAGNOSIS — Z794 Long term (current) use of insulin: Secondary | ICD-10-CM | POA: Diagnosis not present

## 2016-11-15 DIAGNOSIS — I10 Essential (primary) hypertension: Secondary | ICD-10-CM | POA: Diagnosis present

## 2016-11-15 LAB — I-STAT TROPONIN, ED: Troponin i, poc: 0 ng/mL (ref 0.00–0.08)

## 2016-11-15 LAB — BASIC METABOLIC PANEL
Anion gap: 10 (ref 5–15)
Anion gap: 10 (ref 5–15)
BUN: 5 mg/dL — AB (ref 6–20)
BUN: 6 mg/dL (ref 6–20)
CHLORIDE: 99 mmol/L — AB (ref 101–111)
CHLORIDE: 105 mmol/L (ref 101–111)
CO2: 23 mmol/L (ref 22–32)
CO2: 24 mmol/L (ref 22–32)
Calcium: 8.8 mg/dL — ABNORMAL LOW (ref 8.9–10.3)
Calcium: 8.2 mg/dL — ABNORMAL LOW (ref 8.9–10.3)
Creatinine, Ser: 0.94 mg/dL (ref 0.61–1.24)
Creatinine, Ser: 0.56 mg/dL — ABNORMAL LOW (ref 0.61–1.24)
GFR calc Af Amer: >60 mL/min (ref >60)
GFR calc non Af Amer: >60 mL/min (ref >60)
GLUCOSE: 523 mg/dL — AB (ref 65–99)
Glucose, Bld: 269 mg/dL — ABNORMAL HIGH (ref 65–99)
POTASSIUM: 3.6 mmol/L (ref 3.5–5.1)
Potassium: 3.3 mmol/L — ABNORMAL LOW (ref 3.5–5.1)
SODIUM: 139 mmol/L (ref 135–145)
Sodium: 132 mmol/L — ABNORMAL LOW (ref 135–145)

## 2016-11-15 LAB — CBC
HCT: 43.7 % (ref 39.0–52.0)
HCT: 41.5 % (ref 39.0–52.0)
HEMOGLOBIN: 15.2 g/dL (ref 13.0–17.0)
Hemoglobin: 14.3 g/dL (ref 13.0–17.0)
MCH: 31.7 pg (ref 26.0–34.0)
MCH: 31.6 pg (ref 26.0–34.0)
MCHC: 34.8 g/dL (ref 30.0–36.0)
MCHC: 34.5 g/dL (ref 30.0–36.0)
MCV: 91.2 fL (ref 78.0–100.0)
MCV: 91.6 fL (ref 78.0–100.0)
PLATELETS: 116 10*3/uL — AB (ref 150–400)
PLATELETS: 102 10*3/uL — AB (ref 150–400)
RBC: 4.79 MIL/uL (ref 4.22–5.81)
RBC: 4.53 MIL/uL (ref 4.22–5.81)
RDW: 12.4 % (ref 11.5–15.5)
RDW: 12.5 % (ref 11.5–15.5)
WBC: 9.5 10*3/uL (ref 4.0–10.5)
WBC: 7.2 10*3/uL (ref 4.0–10.5)

## 2016-11-15 LAB — GLUCOSE, CAPILLARY
GLUCOSE-CAPILLARY: 226 mg/dL — AB (ref 65–99)
GLUCOSE-CAPILLARY: 272 mg/dL — AB (ref 65–99)
GLUCOSE-CAPILLARY: 182 mg/dL — AB (ref 65–99)
Glucose-Capillary: 197 mg/dL — ABNORMAL HIGH (ref 65–99)
Glucose-Capillary: 234 mg/dL — ABNORMAL HIGH (ref 65–99)
Glucose-Capillary: 258 mg/dL — ABNORMAL HIGH (ref 65–99)
Glucose-Capillary: 319 mg/dL — ABNORMAL HIGH (ref 65–99)

## 2016-11-15 LAB — LIPID PANEL
CHOL/HDL RATIO: 9.5 ratio
Cholesterol: 303 mg/dL — ABNORMAL HIGH (ref 0–200)
HDL: 32 mg/dL — ABNORMAL LOW (ref >40)
LDL Cholesterol: 194 mg/dL — ABNORMAL HIGH (ref 0–99)
Triglycerides: 385 mg/dL — ABNORMAL HIGH (ref <150)
VLDL: 77 mg/dL — ABNORMAL HIGH (ref 0–40)

## 2016-11-15 LAB — RAPID URINE DRUG SCREEN, HOSP PERFORMED
Amphetamines: NOT DETECTED
BENZODIAZEPINES: NOT DETECTED
Barbiturates: NOT DETECTED
COCAINE: NOT DETECTED
OPIATES: POSITIVE — AB
Tetrahydrocannabinol: NOT DETECTED

## 2016-11-15 LAB — TROPONIN I: Troponin I: <0.03 ng/mL (ref <0.03)

## 2016-11-15 LAB — MRSA PCR SCREENING: MRSA by PCR: NEGATIVE

## 2016-11-15 LAB — ECHOCARDIOGRAM COMPLETE
Height: 68 in
WEIGHTICAEL: 2836.8 [oz_av]

## 2016-11-15 LAB — CBG MONITORING, ED: Glucose-Capillary: 267 mg/dL — ABNORMAL HIGH (ref 65–99)

## 2016-11-15 MED ORDER — INSULIN ASPART 100 UNIT/ML ~~LOC~~ SOLN
10.0000 [IU] | Freq: Once | SUBCUTANEOUS | Status: AC
  Administered 2016-11-15: 10 [IU] via INTRAVENOUS
  Filled 2016-11-15: qty 1

## 2016-11-15 MED ORDER — FLUTICASONE PROPIONATE 50 MCG/ACT NA SUSP
1.0000 | Freq: Every day | NASAL | Status: DC | PRN
  Filled 2016-11-15: qty 16

## 2016-11-15 MED ORDER — HYDROCODONE-ACETAMINOPHEN 5-325 MG PO TABS: 1.0000 | ORAL_TABLET | Freq: Four times a day (QID) | ORAL | Status: DC | PRN

## 2016-11-15 MED ORDER — ASPIRIN 325 MG PO TABS
325.0000 mg | ORAL_TABLET | Freq: Every day | ORAL | Status: DC
  Administered 2016-11-15 – 2016-11-16 (×2): 325 mg via ORAL
  Filled 2016-11-15 (×2): qty 1

## 2016-11-15 MED ORDER — NITROGLYCERIN 0.4 MG SL SUBL
SUBLINGUAL_TABLET | SUBLINGUAL | Status: AC
  Filled 2016-11-15: qty 1

## 2016-11-15 MED ORDER — ATORVASTATIN CALCIUM 40 MG PO TABS
40.0000 mg | ORAL_TABLET | Freq: Every day | ORAL | Status: DC
  Administered 2016-11-15: 40 mg via ORAL
  Filled 2016-11-15: qty 1

## 2016-11-15 MED ORDER — INSULIN ASPART 100 UNIT/ML ~~LOC~~ SOLN: 10.0000 [IU] | Freq: Once | SUBCUTANEOUS | Status: DC

## 2016-11-15 MED ORDER — POTASSIUM CHLORIDE CRYS ER 20 MEQ PO TBCR
40.0000 meq | EXTENDED_RELEASE_TABLET | Freq: Once | ORAL | Status: AC
  Administered 2016-11-15: 40 meq via ORAL
  Filled 2016-11-15: qty 2

## 2016-11-15 MED ORDER — METOPROLOL TARTRATE 50 MG PO TABS
50.0000 mg | ORAL_TABLET | ORAL | Status: AC
  Administered 2016-11-15: 50 mg via ORAL
  Filled 2016-11-15: qty 1

## 2016-11-15 MED ORDER — INSULIN ASPART 100 UNIT/ML ~~LOC~~ SOLN: 0.0000 [IU] | SUBCUTANEOUS | Status: DC

## 2016-11-15 MED ORDER — METOPROLOL TARTRATE 5 MG/5ML IV SOLN
INTRAVENOUS | Status: AC
  Administered 2016-11-15: 5 mg via INTRAVENOUS
  Filled 2016-11-15: qty 5

## 2016-11-15 MED ORDER — METOPROLOL SUCCINATE ER 25 MG PO TB24
25.0000 mg | ORAL_TABLET | Freq: Every day | ORAL | Status: DC
  Filled 2016-11-15: qty 1

## 2016-11-15 MED ORDER — ACETAMINOPHEN 325 MG PO TABS: 650.0000 mg | ORAL_TABLET | ORAL | Status: DC | PRN

## 2016-11-15 MED ORDER — PREGABALIN 50 MG PO CAPS
150.0000 mg | ORAL_CAPSULE | Freq: Two times a day (BID) | ORAL | Status: DC
  Administered 2016-11-15 – 2016-11-16 (×4): 150 mg via ORAL
  Filled 2016-11-15 (×3): qty 3
  Filled 2016-11-15: qty 6

## 2016-11-15 MED ORDER — MECLIZINE HCL 25 MG PO TABS: 25.0000 mg | ORAL_TABLET | Freq: Three times a day (TID) | ORAL | Status: DC | PRN

## 2016-11-15 MED ORDER — METOPROLOL TARTRATE 5 MG/5ML IV SOLN
5.0000 mg | Freq: Once | INTRAVENOUS | Status: AC
  Administered 2016-11-15: 5 mg via INTRAVENOUS

## 2016-11-15 MED ORDER — GI COCKTAIL ~~LOC~~: 30.0000 mL | Freq: Four times a day (QID) | ORAL | Status: DC | PRN

## 2016-11-15 MED ORDER — MORPHINE SULFATE (PF) 4 MG/ML IV SOLN
2.0000 mg | INTRAVENOUS | Status: DC | PRN
  Administered 2016-11-15: 2 mg via INTRAVENOUS
  Filled 2016-11-15: qty 1

## 2016-11-15 MED ORDER — LEVETIRACETAM 500 MG PO TABS
1000.0000 mg | ORAL_TABLET | Freq: Two times a day (BID) | ORAL | Status: DC
  Administered 2016-11-15 – 2016-11-16 (×4): 1000 mg via ORAL
  Filled 2016-11-15 (×4): qty 2

## 2016-11-15 MED ORDER — ONDANSETRON HCL 4 MG/2ML IJ SOLN: 4.0000 mg | Freq: Four times a day (QID) | INTRAMUSCULAR | Status: DC | PRN

## 2016-11-15 MED ORDER — METOPROLOL TARTRATE 50 MG PO TABS
50.0000 mg | ORAL_TABLET | ORAL | Status: DC
  Filled 2016-11-15: qty 1

## 2016-11-15 MED ORDER — IOPAMIDOL (ISOVUE-370) INJECTION 76%
INTRAVENOUS | Status: AC
  Administered 2016-11-15: 100 mL
  Filled 2016-11-15: qty 100

## 2016-11-15 MED ORDER — INSULIN GLARGINE 100 UNIT/ML ~~LOC~~ SOLN
10.0000 [IU] | Freq: Every day | SUBCUTANEOUS | Status: DC
  Administered 2016-11-15: 10 [IU] via SUBCUTANEOUS
  Filled 2016-11-15: qty 0.1

## 2016-11-15 MED ORDER — METOPROLOL SUCCINATE ER 50 MG PO TB24
50.0000 mg | ORAL_TABLET | Freq: Every day | ORAL | Status: DC
  Administered 2016-11-15 – 2016-11-16 (×2): 50 mg via ORAL
  Filled 2016-11-15 (×2): qty 1

## 2016-11-15 MED ORDER — NITROGLYCERIN 0.4 MG SL SUBL
0.8000 mg | SUBLINGUAL_TABLET | Freq: Once | SUBLINGUAL | Status: AC
  Administered 2016-11-15: 0.8 mg via SUBLINGUAL

## 2016-11-15 MED ORDER — NITROGLYCERIN 0.4 MG SL SUBL: 0.4000 mg | SUBLINGUAL_TABLET | SUBLINGUAL | Status: DC | PRN

## 2016-11-15 MED ORDER — SODIUM CHLORIDE 0.9 % IV BOLUS (SEPSIS)
250.0000 mL | Freq: Once | INTRAVENOUS | Status: AC
  Administered 2016-11-15: 250 mL via INTRAVENOUS

## 2016-11-15 MED ORDER — SODIUM CHLORIDE 0.9 % IV BOLUS (SEPSIS)
1000.0000 mL | Freq: Once | INTRAVENOUS | Status: AC
  Administered 2016-11-15: 1000 mL via INTRAVENOUS

## 2016-11-15 MED ORDER — ENOXAPARIN SODIUM 40 MG/0.4ML ~~LOC~~ SOLN
40.0000 mg | SUBCUTANEOUS | Status: DC
  Administered 2016-11-15 – 2016-11-16 (×2): 40 mg via SUBCUTANEOUS
  Filled 2016-11-15 (×2): qty 0.4

## 2016-11-15 MED ORDER — FLUOXETINE HCL 20 MG PO CAPS
40.0000 mg | ORAL_CAPSULE | Freq: Every day | ORAL | Status: DC
  Administered 2016-11-15 – 2016-11-16 (×2): 40 mg via ORAL
  Filled 2016-11-15 (×2): qty 2

## 2016-11-15 MED ORDER — INSULIN ASPART 100 UNIT/ML ~~LOC~~ SOLN
0.0000 [IU] | SUBCUTANEOUS | Status: DC
  Administered 2016-11-15: 2 [IU] via SUBCUTANEOUS
  Administered 2016-11-15: 5 [IU] via SUBCUTANEOUS
  Administered 2016-11-15: 2 [IU] via SUBCUTANEOUS
  Administered 2016-11-15: 7 [IU] via SUBCUTANEOUS
  Administered 2016-11-16 (×2): 3 [IU] via SUBCUTANEOUS
  Administered 2016-11-16: 2 [IU] via SUBCUTANEOUS
  Administered 2016-11-16: 3 [IU] via SUBCUTANEOUS

## 2016-11-15 NOTE — ED Notes (Signed)
Report attempted 

## 2016-11-15 NOTE — ED Notes (Signed)
CRITICAL VALUE ALERT  Critical value received:  Glucose 523

## 2016-11-15 NOTE — H&P (Addendum)
History and Physical    Miguel CoeGeorge L Lamantia Jr. ZOX:[id number] DOB: June 20, 1971 DOA: 11/14/2016  Referring MD/NP/PA: Dr. Dione Boozeavid Glick PCP: System, Provider Not In  Patient coming from: via EMS  Chief Complaint: Chest pain  HPI: Miguel CoeGeorge L Mcenaney Jr. is a 46 y.o. male with medical history significant of DM type 2, CVA, left eye blindness, seizure do, and tobacco.; who presents with complaints of left-sided chest pain. Symptoms started yesterday afternoon while sitting down watching television. Pain was described as sharp with pressure, and radiated up into his neck. Associated symptoms include reported feelings of dizziness over the last few days and reported syncopee 4 days ago. Patient reports not seeking medical attention following the episode. Patient was previously evaluated for chest pain symptoms in 12/2015 and underwent Myoview stress test showed low risk.  ED Course: Upon admission into the hospitalPatient was seen to be afebrile, pulse 99-117, and all the vital signs relatively within normal limits. Labs revealed glucose 116, sodium 132, potassium 3.3, chloride 99, glucose 535, anion gap 10, troponin 0. Patient was given 10 units of NovoLog. Patient noted persistence of chest pain symptoms despite multiple rounds of morphine.  Review of Systems: As per HPI otherwise 10 point review of systems negative.   Past Medical History:  Diagnosis Date  . Anxiety   . Asthma   . Blindness    bilateral  . Depression   . Diabetes mellitus without complication (HCC)   . Hypercholesteremia   . Neuropathy   . PSVT (paroxysmal supraventricular tachycardia) (HCC) 06/2015  . Stroke (HCC) 02/2014   right sided weakness/numbness  . Vision loss of left eye     Past Surgical History:  Procedure Laterality Date  . ABDOMINAL SURGERY    . EYE SURGERY Left   . HERNIA REPAIR       reports that he has been smoking Cigarettes and E-cigarettes.  He has been smoking about 0.00 packs per day. He has never used  smokeless tobacco. He reports that he drinks alcohol. He reports that he does not use drugs.  Allergies  Allergen Reactions  . Penicillins Other (See Comments)    Unknown childhood reaction  . Toradol [Ketorolac Tromethamine] Hives    Family History  Problem Relation Age of Onset  . High blood pressure Mother   . Diabetes Mellitus I Mother   . High blood pressure Father   . Diabetes Mellitus I Father   . Migraines Daughter   . Parkinsonism Daughter   . Parkinsonism Son   . High blood pressure Sister   . High blood pressure Paternal Grandfather     Prior to Admission medications   Medication Sig Start Date End Date Taking? Authorizing Provider  albuterol (PROVENTIL HFA;VENTOLIN HFA) 108 (90 BASE) MCG/ACT inhaler Inhale 2 puffs into the lungs every 6 (six) hours as needed for wheezing or shortness of breath.   Yes [provider]  aspirin 325 MG tablet Take 1 tablet (325 mg total) by mouth daily. 05/21/14  Yes Vann, Jessica U, DO  atorvastatin (LIPITOR) 40 MG tablet Take 40 mg by mouth at bedtime.    Yes [provider]  celecoxib (CELEBREX) 200 MG capsule Take 200 mg by mouth daily.   Yes [provider]  FLUoxetine (PROZAC) 40 MG capsule Take 40 mg by mouth daily.   Yes [provider]  fluticasone (FLONASE) 50 MCG/ACT nasal spray Place 1 spray into both nostrils daily as needed for allergies or rhinitis.   Yes [provider]  glipiZIDE (GLUCOTROL XL) 10 MG 24 hr tablet Take 10 mg by mouth daily with breakfast.   Yes [provider]  HYDROcodone-acetaminophen (NORCO/VICODIN) 5-325 MG tablet Take 1 tablet by mouth every 6 (six) hours as needed. Patient taking differently: Take 1 tablet by mouth every 6 (six) hours as needed.  02/19/16  Yes Horton, Mayer Masker, MD  ibuprofen (ADVIL,MOTRIN) 600 MG tablet Take 600 mg by mouth every 6 (six) hours as needed for moderate pain.   Yes [provider]  insulin glargine (LANTUS) 100  UNIT/ML injection Inject 10 Units into the skin at bedtime.    Yes [provider]  levETIRAcetam (KEPPRA) 1000 MG tablet Take 1 tablet (1,000 mg total) by mouth 2 (two) times daily. 02/04/15  Yes Lavera Guise, MD  meclizine (ANTIVERT) 25 MG tablet Take 1 tablet (25 mg total) by mouth 3 (three) times daily as needed for dizziness. 07/31/16  Yes Kirichenko, Tatyana, PA-C  metFORMIN (GLUCOPHAGE-XR) 500 MG 24 hr tablet Take 1,000 mg by mouth 2 (two) times daily.   Yes [provider]  metoprolol succinate (TOPROL XL) 25 MG 24 hr tablet Take 1 tablet (25 mg total) by mouth daily. 08/18/15  Yes Cassell Clement, MD  Multiple Vitamins-Minerals (MULTIVITAMIN WITH MINERALS) tablet Take 1 tablet by mouth daily.   Yes [provider]  pregabalin (LYRICA) 150 MG capsule Take 1 capsule (150 mg total) by mouth 2 (two) times daily. 12/19/14  Yes Vassie Loll, MD    Physical Exam:  Constitutional: NAD, calm, comfortable Vitals:   11/14/16 2245 11/14/16 2330 11/15/16 0015 11/15/16 0030  BP: (!) 119/96 (!) 134/105 117/86 96/81  Pulse: (!) 114 (!) 117 99 (!) 106  Resp: 20 (!) 21 17 (!) 9  Temp: 98.7 F (37.1 C)     TempSrc: Oral     SpO2: 97% 95% 96% 96%  Weight:      Height:       Eyes: PERRL, lids and conjunctivae normal ENMT: Mucous membranes are moist. Posterior pharynx clear of any exudate or lesions.  Neck: normal, supple, no masses, no thyromegaly Respiratory: clear to auscultation bilaterally, no wheezing, no crackles. Normal respiratory effort. No accessory muscle use.  Cardiovascular: Regular rate and rhythm, no murmurs / rubs / gallops. No extremity edema. 2+ pedal pulses. No carotid bruits.  Abdomen: no tenderness, no masses palpated. No hepatosplenomegaly. Bowel sounds positive.  Musculoskeletal: no clubbing / cyanosis. No joint deformity upper and lower extremities. Good ROM, no contractures. Normal muscle tone.  Skin: no rashes, lesions, ulcers. No  induration Neurologic: CN 2-12 grossly intact. Sensation intact, DTR normal. Strength 5/5 in all 4.  Psychiatric: Normal judgment and insight. Alert and oriented x 3. Normal mood.     Labs on Admission: I have personally reviewed following labs and imaging studies  CBC:  Recent Labs Lab 11/14/16 2239  WBC 9.5  HGB 15.2  HCT 43.7  MCV 91.2  PLT 116*   Basic Metabolic Panel:  Recent Labs Lab 11/14/16 2239  NA 132*  K 3.3*  CL 99*  CO2 23  GLUCOSE 523*  BUN 5*  CREATININE 0.94  CALCIUM 8.8*   GFR: Estimated Creatinine Clearance: 109.8 mL/min (by C-G formula based on SCr of 0.94 mg/dL). Liver Function Tests: No results for input(s): AST, ALT, ALKPHOS, BILITOT, PROT, ALBUMIN in the last 168 hours. No results for input(s): LIPASE, AMYLASE in the last 168 hours. No results for input(s): AMMONIA in the last 168 hours. Coagulation  Profile: No results for input(s): INR, PROTIME in the last 168 hours. Cardiac Enzymes: No results for input(s): CKTOTAL, CKMB, CKMBINDEX, TROPONINI in the last 168 hours. BNP (last 3 results) No results for input(s): PROBNP in the last 8760 hours. HbA1C: No results for input(s): HGBA1C in the last 72 hours. CBG: No results for input(s): GLUCAP in the last 168 hours. Lipid Profile: No results for input(s): CHOL, HDL, LDLCALC, TRIG, CHOLHDL, LDLDIRECT in the last 72 hours. Thyroid Function Tests: No results for input(s): TSH, T4TOTAL, FREET4, T3FREE, THYROIDAB in the last 72 hours. Anemia Panel: No results for input(s): VITAMINB12, FOLATE, FERRITIN, TIBC, IRON, RETICCTPCT in the last 72 hours. Urine analysis:    Component Value Date/Time   COLORURINE STRAW (A) 07/30/2016 2057   APPEARANCEUR CLEAR 07/30/2016 2057   LABSPEC 1.016 07/30/2016 2057   PHURINE 5.0 07/30/2016 2057   GLUCOSEU >=500 (A) 07/30/2016 2057   HGBUR NEGATIVE 07/30/2016 2057   BILIRUBINUR NEGATIVE 07/30/2016 2057   KETONESUR NEGATIVE 07/30/2016 2057   PROTEINUR  NEGATIVE 07/30/2016 2057   UROBILINOGEN 0.2 12/18/2014 0004   NITRITE NEGATIVE 07/30/2016 2057   LEUKOCYTESUR NEGATIVE 07/30/2016 2057   Sepsis Labs: No results found for this or any previous visit (from the past 240 hour(s)).   Radiological Exams on Admission: Dg Chest 2 View  Result Date: 11/14/2016 CLINICAL DATA:  Left-sided chest and back pain today. EXAM: CHEST  2 VIEW COMPARISON:  Radiographs and CT 03/04/2017 FINDINGS: The cardiomediastinal contours are normal. The lungs are clear. Pulmonary vasculature is normal. No consolidation, pleural effusion, or pneumothorax. No acute osseous abnormalities are seen. IMPRESSION: No acute abnormality. Electronically Signed   By: Rubye Oaks M.D.   On: 11/14/2016 23:12    EKG: Independently reviewed. Sinus tachycardia 109 bpm with T-wave flattening in the inferior leads  Assessment/Plan Chest pain: Acute. Initial troponin negative with EKG showing sinus tachycardia with flattening of T waves in the inferior leads. Heart score= 4 - Admit to a stepdown bed - Trend cardiac enzymes - ASA - Check echocardiogram in a.m. - consult cardiology in a.m. for need of stress testing   Essential hypertension - Continue metoprolol  Hypokalemia: Initial potassium 3.3. Patient was given 40 mEq of potassium chloride. - Continue to monitor and replace as needed  Diabetes mellitus type 2 with hyperglycemia: Acute. Blood sugars previously uncontrolled last hemoglobin A1c was noted to be 0.7 on 12/2015. - Hypoglycemic protocol - Hold glipizide and metformin - Continue home Lantus  H/O Seizure disorder - Continue Keppra    Thrombocytopenia: Chronic. Platelet count 116 on admission. Patient does not appear to have any active source of bleeding and review of records shows platelet counts have been similar since 2015. - Continue to monitor   Hyperlipidemia  - Continue atorvastatin   Tobacco abuse - Counseled on the need for cessation of tobacco     DVT prophylaxis: Lovenox Code Status: Full  Family Communication: No family at beside Disposition Plan: discharge home if workup negative  Consults called: none  Admission status: Observation  Clydie Braun MD [facility] Pager [phone]  If 7PM-7AM, please contact night-coverage [email or web address] Password [facility]  11/15/2016, 12:51 AM

## 2016-11-15 NOTE — Progress Notes (Signed)
CT results reviewed. He has age advanced atherosclerosis, but does not have any severe coronary stenoses. Together with the normal stress test 1 year ago, this reinforces the impression that his current symptoms are noncardiac. On the other hand, he is at high risk for progression to multivessel CAD, MI and need for CABG, due to poor control of risk factors. He often "forgets" to refill meds. He has been off statin for weeks (months?). This explains the very high LDL, which was well controlled in the past. Glycemic control is suboptimal. No plan for cardiac cath or further inpatient cardiac workup. Social issues need some ironing out - including getting meds and transportation to follow up. Please let us know when he is DC'd so we can arrange follow-up.  Thurmon FairMihai Dencil Cayson, MD, [facility] [phone] office [phone] pager

## 2016-11-15 NOTE — Care Management Obs Status (Signed)
MEDICARE OBSERVATION STATUS NOTIFICATION   Patient Details  Name: Miguel CoeGeorge L Pandolfi Jr. MRN: [id number] Date of Birth: 12/10/1970   Medicare Observation Status Notification Given:  Yes    Gala LewandowskyGraves-Bigelow, Chavela Justiniano Kaye, RN 11/15/2016, 4:40 PM

## 2016-11-15 NOTE — Progress Notes (Signed)
Patient arrived to room 3W09 from ED.  Assessment complete, VS obtained, and Admission database began. CHG bath complete and MRSA swab collected.

## 2016-11-15 NOTE — Consult Note (Addendum)
Cardiology Consult    Patient ID: Miguel Diaz. MRN: [id number], DOB/AGE: 44/14/1972   Admit date: 11/14/2016 Date of Consult: 11/15/2016  Primary Physician: System, Provider Not In Reason for Consult: Chest pain Primary Cardiologist: Dr. Patty Sermons Requesting Provider: Dr. Benjamine Mola  History of Present Illness    Miguel Head. is a 46 y.o. male who is being seen today for the evaluation of chest pain at the request of Dr. Benjamine Mola. 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   The patient developed chest pressure, dizziness and feeling his heartbeat in his neck last night around 7:30 pm. He had had some intermittent dizziness for 2 -3 weeks prior that occurred at random times, sometimes while sitting still and made him feel drunk. He has a hs of SVT but has had no palpitations recently. He denies orthopnea, PND and edema. He has occ chest tightness and DOE with climbing 2 flights of stairs and walking a distance over 150 yards. He is on disability and is quite sedentary. He has been a smoker since his teens and continues to smoke 1 PPD. He does not drink alcohol.  The patient has CVD risk factors including strong family history, diabetes, hyperlipidemia, and long-time smoker. His mother had CHF and died at age 37. His father had bypass surgery with his first MI at age 27. His sister also had CHF and died last year at age 23.   Nuclear myocardial imaging study 12/15/2015 was low risk with EF 53% Echo 05/20/2014: normal LEV EF 55-60%, normal wall motion, normal diastolic function.   Past Medical History   Past Medical History:  Diagnosis Date  . Anxiety   . Asthma   . Blindness    bilateral  . Depression   . Diabetes mellitus without complication (HCC)   . Hypercholesteremia   . Neuropathy   . PSVT  (paroxysmal supraventricular tachycardia) (HCC) 06/2015  . Stroke (HCC) 02/2014   right sided weakness/numbness  . Vision loss of left eye     Past Surgical History:  Procedure Laterality Date  . ABDOMINAL SURGERY    . EYE SURGERY Left   . HERNIA REPAIR       Allergies  Allergies  Allergen Reactions  . Penicillins Other (See Comments)    Unknown childhood reaction  . Toradol [Ketorolac Tromethamine] Hives    Inpatient Medications    . aspirin  325 mg Oral Daily  . atorvastatin  40 mg Oral QHS  . enoxaparin (LOVENOX) injection  40 mg Subcutaneous Q24H  . FLUoxetine  40 mg Oral Daily  . insulin aspart  0-9 Units Subcutaneous Q4H  . insulin aspart  10 Units Intravenous Once  . insulin glargine  10 Units Subcutaneous QHS  . levETIRAcetam  1,000 mg Oral BID  . metoprolol succinate  25 mg Oral Daily  . pregabalin  150 mg Oral BID    Family History    Family History  Problem Relation Age of Onset  . High blood pressure Mother   . Diabetes Mellitus I Mother   . Congenital heart disease Mother   . High blood pressure Father   . Diabetes Mellitus I Father   . CAD Father 44  . Migraines Daughter   . Parkinsonism Daughter   . Parkinsonism Son   . High blood pressure  Sister   . Congenital heart disease Sister   . High blood pressure Paternal Grandfather      Social History    Social History   Social History  . Marital status: Significant Other    Spouse name: N/A  . Number of children: 2  . Years of education: 9th   Occupational History  . disabled    Social History Main Topics  . Smoking status: Current Every Day Smoker    Packs/day: 0.00    Types: Cigarettes, E-cigarettes  . Smokeless tobacco: Never Used     Comment: he does electonic cigarettes sometmes too  . Alcohol use 0.0 oz/week  . Drug use: No  . Sexual activity: Not Currently   Other Topics Concern  . Not on file   Social History Narrative   ** Merged History Encounter **   Patient  Lives  with his girl friend   Patient is right handed   Patient coffee daily         Review of Systems    General:  No chills, fever, night sweats or weight changes.  Cardiovascular:  Positive for chest pain last night as above, Occasional dyspnea on exertion, No edema, orthopnea, palpitations, paroxysmal nocturnal dyspnea. Dermatological: No rash, lesions/masses Respiratory: No cough, dyspnea Urologic: No hematuria, dysuria Abdominal:   No nausea, vomiting, diarrhea, bright red blood per rectum, melena, or hematemesis Neurologic:  No visual changes, wkns, changes in mental status. All other systems reviewed and are otherwise negative except as noted above.  Physical Exam    Blood pressure 119/82, pulse 76, temperature 98 F (36.7 C), temperature source Oral, resp. rate 19, height 5\' 8"  (1.727 m), weight 177 lb 4.8 oz (80.4 kg), SpO2 97 %.  General: Pleasant, NAD Psych: Normal affect. Neuro: Alert and oriented X 3. Moves all extremities spontaneously. HEENT: Normal  Neck: Supple without bruits or JVD. Lungs:  Resp regular and unlabored, CTA. Heart: RRR no s3, s4, or murmurs. Abdomen: Soft, non-tender, non-distended, BS + x 4.  Extremities: No clubbing, cyanosis or edema. DP/PT/Radials 2+ and equal bilaterally.  Labs    Troponin [facility](Point of Care Test)  Recent Labs  11/15/16 0006  TROPIPOC 0.00    Recent Labs  11/15/16 0302 11/15/16 0639  TROPONINI <0.03 <0.03   Lab Results  Component Value Date   WBC 7.2 11/15/2016   HGB 14.3 11/15/2016   HCT 41.5 11/15/2016   MCV 91.6 11/15/2016   PLT 102 (L) 11/15/2016     Recent Labs Lab 11/15/16 0639  NA 139  K 3.6  CL 105  CO2 24  BUN 6  CREATININE 0.56*  CALCIUM 8.2*  GLUCOSE 269*   Lab Results  Component Value Date   CHOL 303 (H) 11/15/2016   HDL 32 (L) 11/15/2016   LDLCALC 194 (H) 11/15/2016   TRIG 385 (H) 11/15/2016   Lab Results  Component Value Date   DDIMER 0.27 09/27/2014     Radiology Studies    Dg  Chest 2 View  Result Date: 11/14/2016 CLINICAL DATA:  Left-sided chest and back pain today. EXAM: CHEST  2 VIEW COMPARISON:  Radiographs and CT 03/04/2017 FINDINGS: The cardiomediastinal contours are normal. The lungs are clear. Pulmonary vasculature is normal. No consolidation, pleural effusion, or pneumothorax. No acute osseous abnormalities are seen. IMPRESSION: No acute abnormality. Electronically Signed   By: Rubye OaksMelanie  Ehinger M.D.   On: 11/14/2016 23:12    EKG & Cardiac Imaging    EKG: Sinus tachycardia, 109 bpm,  probable LAE, non-specific T wave changes inferiorly-similar to EKG 07/30/16  Echocardiogram: Pending  Assessment & Plan    Chest pain -Chest pressure with dizziness and feeling heart beat in his nick last night steady for about 5+hours, Resolved in early am with no reoccurrence. Has had mild chest tightness and DOE with exertion prior. No S/S of heart failure.  -Low risk stress test 12/2015 -The patient has CVD risk factors including strong family history, diabetes, hyperlipidemia, and long-time smoker.  -Troponins negative for 2 occurrences -EKG- no acute ischemic changes -No current chest pain -No objective signs of ischemia. Consider out patient stress test.   Diabetes type 2 on insulin -Elevated glucose on admission 523 -Last hemoglobin A1c 8.7 on 12/14/2015  Hyperlipemia -Atorvastatin 40 mg daily -Lipid panel 11/16/15 LDL 194  Hypokalemia -K+ 3.3. Potassium supplementation per IM.  Thrombocytopenia -Chronic. Plt 116 -No active bleeding  Tobacco use -Counseled on the need for tobacco cessation   Signed, Berton Bon, NP-C 11/15/2016, 8:08 AM Pager: [phone]  I have seen and examined the patient along with Berton Bon, NP-C,.  I have reviewed the chart, notes and new data.  I agree with NP's note.  Key new complaints: no chest discomfort at this time Key examination changes: normal CV exam except S4 present Key new findings / data: normal  troponins, ECG with nonspecific changes, similar to last year when HR was also around 110.  PLAN: Atypical symptoms at rest. Multiple coronary risk factors, but normal nuclear scan last year. Recommend coronary CTA. Worried about compliance with meds (due to vision?). His LDL has doubled since last check. Could he have had beta blocker rebound?  Thurmon Fair, MD, [facility] [phone] 11/15/2016, 8:34 AM

## 2016-11-15 NOTE — Progress Notes (Signed)
Per pt's girlfriend, patient has not taken or filled prescriptions for his home medications since February 2018. States he has trouble getting them, even though girlfriend offers to pick them up for him.  Spoke with case management regarding this conversation.

## 2016-11-15 NOTE — Progress Notes (Signed)
  Echocardiogram 2D Echocardiogram has been performed.  Miguel Diaz L Androw 11/15/2016, 10:19 AM

## 2016-11-15 NOTE — ED Notes (Signed)
Pt continue to have active cp after 8 mg of Morphine IV.

## 2016-11-15 NOTE — Care Management Note (Addendum)
Case Management Note  Patient Details  Name: Miguel CoeGeorge L Evitt Jr. MRN: [id number] Date of Birth: 26-Apr-1971  Subjective/Objective:   Pt presented for chest pain. Pt is from home alone with the support of girlfriend. Pt states he has DME cane at home.                  Action/Plan: CM did speak with pt in regards to medications. Girlfriend will now be able to pick up medications for the patient. Pt uses the [facility] for PCP and medications. CSW was asked to provide pt with transportation application. No further needs from CM at this time.   Expected Discharge Date:  11/17/16               Expected Discharge Plan:  Home/Self Care  In-House Referral:  Clinical Social Work Teaching laboratory technician(Transportation. )  Discharge planning Services  CM Consult, Medication Assistance  Post Acute Care Choice:  NA Choice offered to:  NA  DME Arranged:  N/A DME Agency:  NA  HH Arranged:  NA HH Agency:  NA  Status of Service:  Completed, signed off  If discussed at [employer] Length of Stay Meetings, dates discussed:    Additional Comments: 1041 11-16-16 Tomi BambergerBrenda Graves-Bigelow, RN, BSN [phone] CM did call the [facility]. Talking Diabetic Meter/ Strips not available at this pharmacy. CM did relay to RN since pt asleep at time of visit: pt will need to call the 1-800 number on the meter for strips to be mailed. PCP will probably need to write strips as DME in order for Medicaid to pay. No further needs from CM at this time.  Gala LewandowskyGraves-Bigelow, Drew Lips Kaye, RN 11/15/2016, 4:42 PM

## 2016-11-16 DIAGNOSIS — R072 Precordial pain: Secondary | ICD-10-CM

## 2016-11-16 DIAGNOSIS — E1142 Type 2 diabetes mellitus with diabetic polyneuropathy: Secondary | ICD-10-CM

## 2016-11-16 DIAGNOSIS — I10 Essential (primary) hypertension: Secondary | ICD-10-CM

## 2016-11-16 LAB — GLUCOSE, CAPILLARY
GLUCOSE-CAPILLARY: 198 mg/dL — AB (ref 65–99)
GLUCOSE-CAPILLARY: 175 mg/dL — AB (ref 65–99)
Glucose-Capillary: 218 mg/dL — ABNORMAL HIGH (ref 65–99)
Glucose-Capillary: 238 mg/dL — ABNORMAL HIGH (ref 65–99)
Glucose-Capillary: 241 mg/dL — ABNORMAL HIGH (ref 65–99)

## 2016-11-16 LAB — HEMOGLOBIN A1C
Hgb A1c MFr Bld: 14 % — ABNORMAL HIGH (ref 4.8–5.6)
Mean Plasma Glucose: 355 mg/dL

## 2016-11-16 MED ORDER — INSULIN GLARGINE 100 UNIT/ML ~~LOC~~ SOLN: 10.0000 [IU] | Freq: Every day | SUBCUTANEOUS | 0 refills | Status: DC

## 2016-11-16 MED ORDER — METOPROLOL SUCCINATE ER 50 MG PO TB24: 50.0000 mg | ORAL_TABLET | Freq: Every day | ORAL | 0 refills | Status: AC

## 2016-11-16 MED ORDER — LEVETIRACETAM 1000 MG PO TABS: 1000.0000 mg | ORAL_TABLET | Freq: Two times a day (BID) | ORAL | 0 refills | Status: DC

## 2016-11-16 MED ORDER — FLUTICASONE PROPIONATE 50 MCG/ACT NA SUSP: 1.0000 | Freq: Every day | NASAL | 0 refills | Status: AC | PRN

## 2016-11-16 MED ORDER — MECLIZINE HCL 25 MG PO TABS: 25.0000 mg | ORAL_TABLET | Freq: Three times a day (TID) | ORAL | 0 refills | Status: AC | PRN

## 2016-11-16 MED ORDER — FLUOXETINE HCL 40 MG PO CAPS: 40.0000 mg | ORAL_CAPSULE | Freq: Every day | ORAL | 0 refills | Status: AC

## 2016-11-16 MED ORDER — GLIPIZIDE ER 10 MG PO TB24: 10.0000 mg | ORAL_TABLET | Freq: Every day | ORAL | 0 refills | Status: DC

## 2016-11-16 MED ORDER — ALBUTEROL SULFATE HFA 108 (90 BASE) MCG/ACT IN AERS: 2.0000 | INHALATION_SPRAY | Freq: Four times a day (QID) | RESPIRATORY_TRACT | 0 refills | Status: AC | PRN

## 2016-11-16 MED ORDER — ATORVASTATIN CALCIUM 40 MG PO TABS: 40.0000 mg | ORAL_TABLET | Freq: Every day | ORAL | 0 refills | Status: AC

## 2016-11-16 MED ORDER — METFORMIN HCL ER 500 MG PO TB24: 1000.0000 mg | ORAL_TABLET | Freq: Two times a day (BID) | ORAL | 0 refills | Status: DC

## 2016-11-16 NOTE — Progress Notes (Signed)
46 y.o. male who presented to the ER for evaluation of chest pain and pressure since the night prior. 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   Subjective:  There were no acute events overnight. Pt denies any dyspnea, chest pain or dizziness. He is going to be discharged today per primary team.    Objective:  Temp:  [97.6 F (36.4 C)-98 F (36.7 C)] 98 F (36.7 C) (05/15 0500) Pulse Rate:  [66-81] 66 (05/15 0700) Resp:  [11-16] 12 (05/15 0700) BP: (83-112)/(60-78) 112/78 (05/15 0700) SpO2:  [95 %-98 %] 96 % (05/15 0700) Weight:  [182 lb 6.4 oz (82.7 kg)] 182 lb 6.4 oz (82.7 kg) (05/15 0500) Weight change: -13 lb 9.6 oz (-6.169 kg)  Intake/Output from previous day: 05/14 0701 - 05/15 0700 In: 1147.5 [P.O.:360; IV Piggyback:787.5] Out: 750 [Urine:750]  Intake/Output from this shift: Total I/O In: 240 [P.O.:240] Out: -   Medications: Current Facility-Administered Medications  Medication Dose Route Frequency Provider Last Rate Last Dose  . acetaminophen (TYLENOL) tablet 650 mg  650 mg Oral Q4H PRN Fuller Plan A, MD      . aspirin tablet 325 mg  325 mg Oral Daily Tamala Julian, Rondell A, MD   325 mg at 11/16/16 0915  . atorvastatin (LIPITOR) tablet 40 mg  40 mg Oral QHS Fuller Plan A, MD   40 mg at 11/15/16 2207  . enoxaparin (LOVENOX) injection 40 mg  40 mg Subcutaneous Q24H Tamala Julian, Rondell A, MD   40 mg at 11/16/16 0741  . FLUoxetine (PROZAC) capsule 40 mg  40 mg Oral Daily Fuller Plan A, MD   40 mg at 11/16/16 0914  . fluticasone (FLONASE) 50 MCG/ACT nasal spray 1 spray  1 spray Each Nare Daily PRN Fuller Plan A, MD      . gi cocktail (Maalox,Lidocaine,Donnatal)  30 mL Oral QID PRN Fuller Plan A, MD      . HYDROcodone-acetaminophen (NORCO/VICODIN) 5-325 MG per tablet 1 tablet  1 tablet  Oral Q6H PRN Smith, Rondell A, MD      . insulin aspart (novoLOG) injection 0-9 Units  0-9 Units Subcutaneous Q4H Fuller Plan A, MD   2 Units at 11/16/16 0741  . insulin aspart (novoLOG) injection 10 Units  10 Units Intravenous Once Norval Morton, MD   Stopped at 11/15/16 0535  . insulin glargine (LANTUS) injection 10 Units  10 Units Subcutaneous QHS Norval Morton, MD   10 Units at 11/15/16 2208  . levETIRAcetam (KEPPRA) tablet 1,000 mg  1,000 mg Oral BID Fuller Plan A, MD   1,000 mg at 11/16/16 0914  . meclizine (ANTIVERT) tablet 25 mg  25 mg Oral TID PRN Fuller Plan A, MD      . metoprolol succinate (TOPROL-XL) 24 hr tablet 50 mg  50 mg Oral Daily Daune Perch, NP   50 mg at 11/16/16 0914  . nitroGLYCERIN (NITROSTAT) SL tablet 0.4 mg  0.4 mg Sublingual Q5 min PRN Fuller Plan A, MD      . ondansetron (ZOFRAN) injection 4 mg  4 mg Intravenous Q6H PRN Smith, Rondell A, MD      . pregabalin (LYRICA) capsule 150 mg  150 mg Oral BID Fuller Plan A, MD   150 mg at 11/16/16 0915    Physical Exam: General: Vital signs reviewed. Patient in  no acute distress Cardiovascular: regular rate, rhythm, no murmur appreciated  Pulmonary/Chest: Clear to auscultation bilaterally, no wheezes, rales, or rhonchi. Abdominal: Soft, non-tender, non-distended, BS + Extremities: No lower extremity edema bilaterally, pulses symmetric and intact bilaterally. Skin: Warm, dry and intact. No rashes or erythema.    Lab Results: Results for orders placed or performed during the hospital encounter of 11/14/16 (from the past 48 hour(s))  Basic metabolic panel     Status: Abnormal   Collection Time: 11/14/16 10:39 PM  Result Value Ref Range   Sodium 132 (L) 135 - 145 mmol/L   Potassium 3.3 (L) 3.5 - 5.1 mmol/L   Chloride 99 (L) 101 - 111 mmol/L   CO2 23 22 - 32 mmol/L   Glucose, Bld 523 (HH) 65 - 99 mg/dL    Comment: CRITICAL RESULT CALLED TO, READ BACK BY AND VERIFIED WITH: LEBRON Y,RN 11/15/16  0011 WAYK    BUN 5 (L) 6 - 20 mg/dL   Creatinine, Ser 0.94 0.61 - 1.24 mg/dL   Calcium 8.8 (L) 8.9 - 10.3 mg/dL   GFR calc non Af Amer >60 >60 mL/min   GFR calc Af Amer >60 >60 mL/min    Comment: (NOTE) The eGFR has been calculated using the CKD EPI equation. This calculation has not been validated in all clinical situations. eGFR's persistently <60 mL/min signify possible Chronic Kidney Disease.    Anion gap 10 5 - 15  CBC     Status: Abnormal   Collection Time: 11/14/16 10:39 PM  Result Value Ref Range   WBC 9.5 4.0 - 10.5 K/uL   RBC 4.79 4.22 - 5.81 MIL/uL   Hemoglobin 15.2 13.0 - 17.0 g/dL   HCT 43.7 39.0 - 52.0 %   MCV 91.2 78.0 - 100.0 fL   MCH 31.7 26.0 - 34.0 pg   MCHC 34.8 30.0 - 36.0 g/dL   RDW 12.4 11.5 - 15.5 %   Platelets 116 (L) 150 - 400 K/uL    Comment: PLATELET COUNT CONFIRMED BY SMEAR  I-stat troponin, ED     Status: None   Collection Time: 11/15/16 12:06 AM  Result Value Ref Range   Troponin i, poc 0.00 0.00 - 0.08 ng/mL   Comment 3            Comment: Due to the release kinetics of cTnI, a negative result within the first hours of the onset of symptoms does not rule out myocardial infarction with certainty. If myocardial infarction is still suspected, repeat the test at appropriate intervals.   CBG monitoring, ED     Status: Abnormal   Collection Time: 11/15/16  1:26 AM  Result Value Ref Range   Glucose-Capillary 267 (H) 65 - 99 mg/dL  Troponin I-serum (0, 3, 6 hours)     Status: None   Collection Time: 11/15/16  3:02 AM  Result Value Ref Range   Troponin I <0.03 <0.03 ng/mL  MRSA PCR Screening     Status: None   Collection Time: 11/15/16  3:25 AM  Result Value Ref Range   MRSA by PCR NEGATIVE NEGATIVE    Comment:        The GeneXpert MRSA Assay (FDA approved for NASAL specimens only), is one component of a comprehensive MRSA colonization surveillance program. It is not intended to diagnose MRSA infection nor to guide or monitor treatment  for MRSA infections.   Glucose, capillary     Status: Abnormal   Collection Time: 11/15/16  4:12 AM  Result  Value Ref Range   Glucose-Capillary 226 (H) 65 - 99 mg/dL  Urine rapid drug screen (hosp performed)     Status: Abnormal   Collection Time: 11/15/16  4:35 AM  Result Value Ref Range   Opiates POSITIVE (A) NONE DETECTED   Cocaine NONE DETECTED NONE DETECTED   Benzodiazepines NONE DETECTED NONE DETECTED   Amphetamines NONE DETECTED NONE DETECTED   Tetrahydrocannabinol NONE DETECTED NONE DETECTED   Barbiturates NONE DETECTED NONE DETECTED    Comment:        DRUG SCREEN FOR MEDICAL PURPOSES ONLY.  IF CONFIRMATION IS NEEDED FOR ANY PURPOSE, NOTIFY LAB WITHIN 5 DAYS.        LOWEST DETECTABLE LIMITS FOR URINE DRUG SCREEN Drug Class       Cutoff (ng/mL) Amphetamine      1000 Barbiturate      200 Benzodiazepine   917 Tricyclics       915 Opiates          300 Cocaine          300 THC              50   Troponin I-serum (0, 3, 6 hours)     Status: None   Collection Time: 11/15/16  6:39 AM  Result Value Ref Range   Troponin I <0.03 <0.03 ng/mL  Basic metabolic panel     Status: Abnormal   Collection Time: 11/15/16  6:39 AM  Result Value Ref Range   Sodium 139 135 - 145 mmol/L   Potassium 3.6 3.5 - 5.1 mmol/L   Chloride 105 101 - 111 mmol/L   CO2 24 22 - 32 mmol/L   Glucose, Bld 269 (H) 65 - 99 mg/dL   BUN 6 6 - 20 mg/dL   Creatinine, Ser 0.56 (L) 0.61 - 1.24 mg/dL   Calcium 8.2 (L) 8.9 - 10.3 mg/dL   GFR calc non Af Amer >60 >60 mL/min   GFR calc Af Amer >60 >60 mL/min    Comment: (NOTE) The eGFR has been calculated using the CKD EPI equation. This calculation has not been validated in all clinical situations. eGFR's persistently <60 mL/min signify possible Chronic Kidney Disease.    Anion gap 10 5 - 15  CBC     Status: Abnormal   Collection Time: 11/15/16  6:39 AM  Result Value Ref Range   WBC 7.2 4.0 - 10.5 K/uL   RBC 4.53 4.22 - 5.81 MIL/uL   Hemoglobin 14.3  13.0 - 17.0 g/dL   HCT 41.5 39.0 - 52.0 %   MCV 91.6 78.0 - 100.0 fL   MCH 31.6 26.0 - 34.0 pg   MCHC 34.5 30.0 - 36.0 g/dL   RDW 12.5 11.5 - 15.5 %   Platelets 102 (L) 150 - 400 K/uL    Comment: SPECIMEN CHECKED FOR CLOTS CONSISTENT WITH PREVIOUS RESULT   Lipid panel     Status: Abnormal   Collection Time: 11/15/16  6:39 AM  Result Value Ref Range   Cholesterol 303 (H) 0 - 200 mg/dL   Triglycerides 385 (H) <150 mg/dL   HDL 32 (L) >40 mg/dL   Total CHOL/HDL Ratio 9.5 RATIO   VLDL 77 (H) 0 - 40 mg/dL   LDL Cholesterol 194 (H) 0 - 99 mg/dL    Comment:        Total Cholesterol/HDL:CHD Risk Coronary Heart Disease Risk Table  Men   Women  1/2 Average Risk   3.4   3.3  Average Risk       5.0   4.4  2 X Average Risk   9.6   7.1  3 X Average Risk  23.4   11.0        Use the calculated Patient Ratio above and the CHD Risk Table to determine the patient's CHD Risk.        ATP III CLASSIFICATION (LDL):  <100     mg/dL   Optimal  100-129  mg/dL   Near or Above                    Optimal  130-159  mg/dL   Borderline  160-189  mg/dL   High  >190     mg/dL   Very High   Glucose, capillary     Status: Abnormal   Collection Time: 11/15/16  8:03 AM  Result Value Ref Range   Glucose-Capillary 272 (H) 65 - 99 mg/dL  Glucose, capillary     Status: Abnormal   Collection Time: 11/15/16 11:15 AM  Result Value Ref Range   Glucose-Capillary 197 (H) 65 - 99 mg/dL   Comment 1 Notify RN   Troponin I     Status: None   Collection Time: 11/15/16 12:18 PM  Result Value Ref Range   Troponin I <0.03 <0.03 ng/mL  Glucose, capillary     Status: Abnormal   Collection Time: 11/15/16  4:15 PM  Result Value Ref Range   Glucose-Capillary 182 (H) 65 - 99 mg/dL  Glucose, capillary     Status: Abnormal   Collection Time: 11/15/16  7:56 PM  Result Value Ref Range   Glucose-Capillary 319 (H) 65 - 99 mg/dL  Glucose, capillary     Status: Abnormal   Collection Time: 11/15/16  9:04 PM   Result Value Ref Range   Glucose-Capillary 258 (H) 65 - 99 mg/dL  Glucose, capillary     Status: Abnormal   Collection Time: 11/16/16 12:00 AM  Result Value Ref Range   Glucose-Capillary 234 (H) 65 - 99 mg/dL  Glucose, capillary     Status: Abnormal   Collection Time: 11/16/16 12:46 AM  Result Value Ref Range   Glucose-Capillary 241 (H) 65 - 99 mg/dL  Glucose, capillary     Status: Abnormal   Collection Time: 11/16/16  4:59 AM  Result Value Ref Range   Glucose-Capillary 218 (H) 65 - 99 mg/dL  Glucose, capillary     Status: Abnormal   Collection Time: 11/16/16  5:09 AM  Result Value Ref Range   Glucose-Capillary 198 (H) 65 - 99 mg/dL  Glucose, capillary     Status: Abnormal   Collection Time: 11/16/16  7:24 AM  Result Value Ref Range   Glucose-Capillary 175 (H) 65 - 99 mg/dL      Assessment:  1. Principal Problem: 2.   Chest pain 3. Active Problems: 4.   Tobacco abuse 5.   Essential hypertension, benign 6.   DM type 2 with diabetic peripheral neuropathy ([place]) 7.   Hypokalemia 8.   Plan:  Chest pain with atypical symptoms at rest: Pt presented with chest pressure with dizziness since last night, which resolved in early am with no reoccurrence. Has had mild chest tightness and DOE with exertion prior. No S/S of heart failure. He had a low risk stress test 12/2015. Troponins have been negative, and EKG without any ischemic changes.  Pt  underwent coronary CTA yesterday which showed age advanced atherosclerosis, but does not have any severe coronary stenoses. Together with the normal stress test 1 year ago, this reinforces the impression that his current symptoms are noncardiac  -No plan for cardiac cath or further inpatient cardiac workup -needs to follow up with outpatient cardiology - Aug 2nd 9 AM appt time    HTN: BP has been normotensive on metoprolol -continue metoprolol   Hyperlipidemia: He often "forgets" to refill meds. He has been off statin for weeks (months?).  This explains the very high LDL. Lipid panel 11/16/15 LDL 194  -Atorvastatin 40 mg daily     Length of Stay:  LOS: 1 day    Burgess Estelle 11/16/2016, 10:18 AM  I have seen and examined the patient along with Burgess Estelle MD.  I have reviewed the chart, notes and new data.  I agree with his note.  Key new complaints: no chest pain Key examination changes: no arrhythmia, no HF findings Key new findings / data: CT coronary angio reviewed - he has multiple plaques, but no meaningful stenosis yet.  PLAN: The focus is on improved risk factor control and this will require not only medical interventions, but especially social support.  Sanda Klein, MD, [place] [phone] 11/16/2016, 11:38 AM

## 2016-11-16 NOTE — Discharge Summary (Signed)
Physician Discharge Summary  Miguel Diaz. ZOX:[id number] DOB: 05/27/1971 DOA: 11/14/2016  PCP: System, Provider Not In  Admit date: 11/14/2016 Discharge date: 11/16/2016   Recommendations for Outpatient Follow-Up:   1. Compliance with medications-- haven't been taking for at least 4 months   Discharge Diagnosis:   Principal Problem:   Chest pain Active Problems:   Tobacco abuse   Essential hypertension, benign   DM type 2 with diabetic peripheral neuropathy (HCC)   Hypokalemia   Discharge disposition:  Home.:  Discharge Condition: Improved.  Diet recommendation: Low sodium, heart healthy.  Carbohydrate-modified  Wound care: None.   History of Present Illness:   Miguel Diaz. is a 46 y.o. male with medical history significant of DM type 2, CVA, left eye blindness, seizure do, and tobacco.; who presents with complaints of left-sided chest pain. Symptoms started yesterday afternoon while sitting down watching television. Pain was described as sharp with pressure, and radiated up into his neck. Associated symptoms include reported feelings of dizziness over the last few days and reported syncopee 4 days ago. Patient reports not seeking medical attention following the episode. Patient was previously evaluated for chest pain symptoms in 12/2015 and underwent Myoview stress test showed low risk.   Hospital Course by Problem:   Chest pain: Acute.  -CE -CTA: multiple plaques, but no meaningful stenosis yet  Essential hypertension - Continue metoprolol  Hypokalemia: -repleted  Diabetes mellitus type 2 with hyperglycemia -resume home meds  H/O Seizure disorder - Continue Keppra    Thrombocytopenia: Chronic. -CBC as outpatient  Hyperlipidemia  - Continue atorvastatin   Tobacco abuse - cessation of tobacco      Medical Consultants:    None.   Discharge Exam:   Vitals:   11/16/16 0500 11/16/16 0700  BP: 94/76 112/78  Pulse: 79 66    Resp: 13 12  Temp: 98 F (36.7 C)    Vitals:   11/15/16 2100 11/15/16 2300 11/16/16 0500 11/16/16 0700  BP: 97/73 96/74 94/76  112/78  Pulse: 80 72 79 66  Resp: 16 12 13 12   Temp: 97.6 F (36.4 C)  98 F (36.7 C)   TempSrc: Oral  Oral   SpO2: 96% 95% 98% 96%  Weight:   82.7 kg (182 lb 6.4 oz)   Height:        Gen:  NAD   The results of significant diagnostics from this hospitalization (including imaging, microbiology, ancillary and laboratory) are listed below for reference.     Procedures and Diagnostic Studies:   Dg Chest 2 View  Result Date: 11/14/2016 CLINICAL DATA:  Left-sided chest and back pain today. EXAM: CHEST  2 VIEW COMPARISON:  Radiographs and CT 03/04/2017 FINDINGS: The cardiomediastinal contours are normal. The lungs are clear. Pulmonary vasculature is normal. No consolidation, pleural effusion, or pneumothorax. No acute osseous abnormalities are seen. IMPRESSION: No acute abnormality. Electronically Signed   By: Rubye Oaks M.D.   On: 11/14/2016 23:12   Ct Coronary Morph W/cta Cor W/score W/ca W/cm &/or Wo/cm  Result Date: 11/15/2016 CLINICAL DATA:  46 year old male with chest pain. EXAM: Cardiac/Coronary  CT TECHNIQUE: The patient was scanned on a Philips 256 scanner. FINDINGS: A 120 kV prospective scan was triggered in the descending thoracic aorta at 111 HU's. Axial non-contrast 3 mm slices were carried out through the heart. The data set was analyzed on a dedicated work station and scored using the Agatson method. Gantry rotation speed was 270 msecs and collimation was .9  mm. 5 mg of iv Metoprolol and 0.8 mg of sl NTG was given. The 3D data set was reconstructed in 5% intervals of the 67-82 % of the R-R cycle. Diastolic phases were analyzed on a dedicated work station using MPR, MIP and VRT modes. The patient received 80 cc of contrast. Aorta:  Normal size, no calcifications, no dissection. Aortic Valve:  Trileaflet.  No calcifications. Coronary Arteries:   Normal coronary origin.  Right dominance. Left main is a large caliber vessel that gives rise to LAD and LCX arteries and has no plaque. LAD is a long vessel that has diffuse calcified plaque in the proximal portion associated with 25-50% and moderate mixed plaque in the mid portion associated with 50-69% stenosis. Distal LAD is poorly opacified. D1 is a small vessel. LCX artery is a medium caliber non-dominant vessel that gives rise to two obtuse marginal branches and has mild calcified plaque associated with 25-50% stenosis. RCA is a medium size vessel that gives rise to PDA that is poorly opacified, PLA is not visualized. Proximal RCA has mild calcified plaque associated with 25-50% stenosis, mid to distal RCA has a small lumen. Other findings: Normal pulmonary vein drainage into the left atrium. Normal left atrial appendage with no evidence of a thrombus. No ASD/VSD. Normal size of the pulmonary artery. IMPRESSION: 1. Coronary calcium score of 75. This was 62 percentile for age and sex matched control. 2. Normal coronary origin. Right dominance. Limited study quality sec to suboptimal vessel dilatation. 3. 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 An aggressive risk factor modification is recommended. Tobias Alexander Electronically Signed   By: Tobias Alexander   On: 11/15/2016 16:18     Labs:   Basic Metabolic Panel:  Recent Labs Lab 11/14/16 2239 11/15/16 0639  NA 132* 139  K 3.3* 3.6  CL 99* 105  CO2 23 24  GLUCOSE 523* 269*  BUN 5* 6  CREATININE 0.94 0.56*  CALCIUM 8.8* 8.2*   GFR Estimated Creatinine Clearance: 124.8 mL/min (A) (by C-G formula based on SCr of 0.56 mg/dL (L)). Liver Function Tests: No results for input(s): AST, ALT, ALKPHOS, BILITOT, PROT, ALBUMIN in the last 168 hours. No results for input(s): LIPASE, AMYLASE in the last 168 hours. No results for input(s): AMMONIA in the last 168 hours. Coagulation profile No results for input(s): INR,  PROTIME in the last 168 hours.  CBC:  Recent Labs Lab 11/14/16 2239 11/15/16 0639  WBC 9.5 7.2  HGB 15.2 14.3  HCT 43.7 41.5  MCV 91.2 91.6  PLT 116* 102*   Cardiac Enzymes:  Recent Labs Lab 11/15/16 0302 11/15/16 0639 11/15/16 1218  TROPONINI <0.03 <0.03 <0.03   BNP: Invalid input(s): POCBNP CBG:  Recent Labs Lab 11/16/16 0000 11/16/16 0046 11/16/16 0459 11/16/16 0509 11/16/16 0724  GLUCAP 234* 241* 218* 198* 175*   D-Dimer No results for input(s): DDIMER in the last 72 hours. Hgb A1c No results for input(s): HGBA1C in the last 72 hours. Lipid Profile  Recent Labs  11/15/16 0639  CHOL 303*  HDL 32*  LDLCALC 194*  TRIG 385*  CHOLHDL 9.5   Thyroid function studies No results for input(s): TSH, T4TOTAL, T3FREE, THYROIDAB in the last 72 hours.  Invalid input(s): FREET3 Anemia work up No results for input(s): VITAMINB12, FOLATE, FERRITIN, TIBC, IRON, RETICCTPCT in the last 72 hours. Microbiology Recent Results (from the past 240 hour(s))  MRSA PCR Screening     Status: None   Collection Time:  11/15/16  3:25 AM  Result Value Ref Range Status   MRSA by PCR NEGATIVE NEGATIVE Final    Comment:        The GeneXpert MRSA Assay (FDA approved for NASAL specimens only), is one component of a comprehensive MRSA colonization surveillance program. It is not intended to diagnose MRSA infection nor to guide or monitor treatment for MRSA infections.      Discharge Instructions:   Discharge Instructions    Diet - low sodium heart healthy    Complete by:  As directed    Diet Carb Modified    Complete by:  As directed    Discharge instructions    Complete by:  As directed    Be sure to get your medications filled and take as prescribed   Increase activity slowly    Complete by:  As directed      Allergies as of 11/16/2016      Reactions   Penicillins Other (See Comments)   Unknown childhood reaction   Toradol [ketorolac Tromethamine] Hives       Medication List    STOP taking these medications   celecoxib 200 MG capsule Commonly known as:  CELEBREX     TAKE these medications   albuterol 108 (90 Base) MCG/ACT inhaler Commonly known as:  PROVENTIL HFA;VENTOLIN HFA Inhale 2 puffs into the lungs every 6 (six) hours as needed for wheezing or shortness of breath.   aspirin 325 MG tablet Take 1 tablet (325 mg total) by mouth daily.   atorvastatin 40 MG tablet Commonly known as:  LIPITOR Take 1 tablet (40 mg total) by mouth at bedtime.   FLUoxetine 40 MG capsule Commonly known as:  PROZAC Take 1 capsule (40 mg total) by mouth daily.   fluticasone 50 MCG/ACT nasal spray Commonly known as:  FLONASE Place 1 spray into both nostrils daily as needed for allergies or rhinitis.   glipiZIDE 10 MG 24 hr tablet Commonly known as:  GLUCOTROL XL Take 1 tablet (10 mg total) by mouth daily with breakfast.   HYDROcodone-acetaminophen 5-325 MG tablet Commonly known as:  NORCO/VICODIN Take 1 tablet by mouth every 6 (six) hours as needed.   ibuprofen 600 MG tablet Commonly known as:  ADVIL,MOTRIN Take 600 mg by mouth every 6 (six) hours as needed for moderate pain.   insulin glargine 100 UNIT/ML injection Commonly known as:  LANTUS Inject 0.1 mLs (10 Units total) into the skin at bedtime.   levETIRAcetam 1000 MG tablet Commonly known as:  KEPPRA Take 1 tablet (1,000 mg total) by mouth 2 (two) times daily.   meclizine 25 MG tablet Commonly known as:  ANTIVERT Take 1 tablet (25 mg total) by mouth 3 (three) times daily as needed for dizziness.   metFORMIN 500 MG 24 hr tablet Commonly known as:  GLUCOPHAGE-XR Take 2 tablets (1,000 mg total) by mouth 2 (two) times daily.   metoprolol succinate 50 MG 24 hr tablet Commonly known as:  TOPROL-XL Take 1 tablet (50 mg total) by mouth daily. Take with or immediately following a meal. Start taking on:  11/17/2016 What changed:  medication strength  how much to take  additional  instructions   multivitamin with minerals tablet Take 1 tablet by mouth daily.   pregabalin 150 MG capsule Commonly known as:  LYRICA Take 1 capsule (150 mg total) by mouth 2 (two) times daily.      Follow-up Information    Kipp Laurence, PA-C Follow up in 1 week(s).  Specialty:  Physician Assistant Contact information: [address] [place] [id number] [phone]            Time coordinating discharge: 35 min  Signed:  JESSICA Juanetta GoslingU VANN   [facility] 11/16/2016, 9:39 AM

## 2016-12-24 ENCOUNTER — Observation Stay (HOSPITAL_COMMUNITY)
Admission: EM | Admit: 2016-12-24 | Discharge: 2016-12-26 | Disposition: A | Discharge status: Home / Self Care | Payer: Medicare Other | Attending: Internal Medicine | Admitting: Internal Medicine

## 2016-12-24 ENCOUNTER — Emergency Department (HOSPITAL_COMMUNITY): Payer: Medicare Other

## 2016-12-24 ENCOUNTER — Encounter (HOSPITAL_COMMUNITY): Payer: Self-pay

## 2016-12-24 DIAGNOSIS — F329 Major depressive disorder, single episode, unspecified: Secondary | ICD-10-CM | POA: Insufficient documentation

## 2016-12-24 DIAGNOSIS — H5462 Unqualified visual loss, left eye, normal vision right eye: Secondary | ICD-10-CM | POA: Insufficient documentation

## 2016-12-24 DIAGNOSIS — E785 Hyperlipidemia, unspecified: Secondary | ICD-10-CM | POA: Diagnosis present

## 2016-12-24 DIAGNOSIS — Z720 Tobacco use: Secondary | ICD-10-CM | POA: Diagnosis present

## 2016-12-24 DIAGNOSIS — D696 Thrombocytopenia, unspecified: Secondary | ICD-10-CM | POA: Diagnosis not present

## 2016-12-24 DIAGNOSIS — R32 Unspecified urinary incontinence: Secondary | ICD-10-CM | POA: Insufficient documentation

## 2016-12-24 DIAGNOSIS — E1142 Type 2 diabetes mellitus with diabetic polyneuropathy: Secondary | ICD-10-CM | POA: Diagnosis present

## 2016-12-24 DIAGNOSIS — Z7982 Long term (current) use of aspirin: Secondary | ICD-10-CM | POA: Diagnosis not present

## 2016-12-24 DIAGNOSIS — Z880 Allergy status to penicillin: Secondary | ICD-10-CM | POA: Insufficient documentation

## 2016-12-24 DIAGNOSIS — I493 Ventricular premature depolarization: Secondary | ICD-10-CM | POA: Diagnosis not present

## 2016-12-24 DIAGNOSIS — R569 Unspecified convulsions: Secondary | ICD-10-CM

## 2016-12-24 DIAGNOSIS — E872 Acidosis, unspecified: Secondary | ICD-10-CM

## 2016-12-24 DIAGNOSIS — E7800 Pure hypercholesterolemia, unspecified: Secondary | ICD-10-CM | POA: Diagnosis not present

## 2016-12-24 DIAGNOSIS — F419 Anxiety disorder, unspecified: Secondary | ICD-10-CM | POA: Insufficient documentation

## 2016-12-24 DIAGNOSIS — I10 Essential (primary) hypertension: Secondary | ICD-10-CM | POA: Diagnosis present

## 2016-12-24 DIAGNOSIS — R000 Tachycardia, unspecified: Secondary | ICD-10-CM | POA: Diagnosis present

## 2016-12-24 DIAGNOSIS — Z8673 Personal history of transient ischemic attack (TIA), and cerebral infarction without residual deficits: Secondary | ICD-10-CM | POA: Diagnosis not present

## 2016-12-24 DIAGNOSIS — Z79899 Other long term (current) drug therapy: Secondary | ICD-10-CM | POA: Diagnosis not present

## 2016-12-24 DIAGNOSIS — G40909 Epilepsy, unspecified, not intractable, without status epilepticus: Secondary | ICD-10-CM | POA: Insufficient documentation

## 2016-12-24 DIAGNOSIS — D72829 Elevated white blood cell count, unspecified: Secondary | ICD-10-CM | POA: Insufficient documentation

## 2016-12-24 DIAGNOSIS — R7989 Other specified abnormal findings of blood chemistry: Secondary | ICD-10-CM | POA: Diagnosis present

## 2016-12-24 DIAGNOSIS — Z794 Long term (current) use of insulin: Secondary | ICD-10-CM | POA: Diagnosis not present

## 2016-12-24 DIAGNOSIS — J45909 Unspecified asthma, uncomplicated: Secondary | ICD-10-CM | POA: Insufficient documentation

## 2016-12-24 DIAGNOSIS — Z8249 Family history of ischemic heart disease and other diseases of the circulatory system: Secondary | ICD-10-CM | POA: Insufficient documentation

## 2016-12-24 DIAGNOSIS — F17210 Nicotine dependence, cigarettes, uncomplicated: Secondary | ICD-10-CM | POA: Insufficient documentation

## 2016-12-24 DIAGNOSIS — G9340 Encephalopathy, unspecified: Principal | ICD-10-CM | POA: Diagnosis present

## 2016-12-24 DIAGNOSIS — Z9119 Patient's noncompliance with other medical treatment and regimen: Secondary | ICD-10-CM | POA: Diagnosis not present

## 2016-12-24 DIAGNOSIS — I471 Supraventricular tachycardia: Secondary | ICD-10-CM | POA: Diagnosis not present

## 2016-12-24 DIAGNOSIS — E876 Hypokalemia: Secondary | ICD-10-CM | POA: Insufficient documentation

## 2016-12-24 HISTORY — DX: Unspecified convulsions: R56.9

## 2016-12-24 LAB — CK: CK TOTAL: 96 U/L (ref 49–397)

## 2016-12-24 LAB — I-STAT CG4 LACTIC ACID, ED: LACTIC ACID, VENOUS: 16.07 mmol/L — AB (ref 0.5–1.9)

## 2016-12-24 LAB — CBC WITH DIFFERENTIAL/PLATELET
Basophils Absolute: 0 10*3/uL (ref 0.0–0.1)
Basophils Relative: 0 %
EOS ABS: 0.3 10*3/uL (ref 0.0–0.7)
EOS PCT: 2 %
HCT: 50.4 % (ref 39.0–52.0)
Hemoglobin: 17.9 g/dL — ABNORMAL HIGH (ref 13.0–17.0)
LYMPHS PCT: 37 %
Lymphs Abs: 5.3 10*3/uL — ABNORMAL HIGH (ref 0.7–4.0)
MCH: 32.6 pg (ref 26.0–34.0)
MCHC: 35.5 g/dL (ref 30.0–36.0)
MCV: 91.8 fL (ref 78.0–100.0)
MONOS PCT: 7 %
Monocytes Absolute: 1 10*3/uL (ref 0.1–1.0)
Neutro Abs: 7.5 10*3/uL (ref 1.7–7.7)
Neutrophils Relative %: 54 %
PLATELETS: 211 10*3/uL (ref 150–400)
RBC: 5.49 MIL/uL (ref 4.22–5.81)
RDW: 12.4 % (ref 11.5–15.5)
WBC: 14.1 10*3/uL — ABNORMAL HIGH (ref 4.0–10.5)

## 2016-12-24 LAB — COMPREHENSIVE METABOLIC PANEL
ALK PHOS: 133 U/L — AB (ref 38–126)
ALT: 21 U/L (ref 17–63)
ANION GAP: 25 — AB (ref 5–15)
AST: 36 U/L (ref 15–41)
Albumin: 4.5 g/dL (ref 3.5–5.0)
BUN: <5 mg/dL — ABNORMAL LOW (ref 6–20)
CALCIUM: 9.1 mg/dL (ref 8.9–10.3)
CO2: 11 mmol/L — AB (ref 22–32)
CREATININE: 0.94 mg/dL (ref 0.61–1.24)
Chloride: 99 mmol/L — ABNORMAL LOW (ref 101–111)
Glucose, Bld: 388 mg/dL — ABNORMAL HIGH (ref 65–99)
Potassium: 3.3 mmol/L — ABNORMAL LOW (ref 3.5–5.1)
SODIUM: 135 mmol/L (ref 135–145)
Total Bilirubin: 0.7 mg/dL (ref 0.3–1.2)
Total Protein: 7.4 g/dL (ref 6.5–8.1)

## 2016-12-24 LAB — RAPID URINE DRUG SCREEN, HOSP PERFORMED
Amphetamines: NOT DETECTED
BARBITURATES: NOT DETECTED
Benzodiazepines: NOT DETECTED
Cocaine: NOT DETECTED
Opiates: NOT DETECTED
TETRAHYDROCANNABINOL: NOT DETECTED

## 2016-12-24 LAB — CBG MONITORING, ED: GLUCOSE-CAPILLARY: 318 mg/dL — AB (ref 65–99)

## 2016-12-24 LAB — ETHANOL

## 2016-12-24 LAB — LACTIC ACID, PLASMA
LACTIC ACID, VENOUS: 2.5 mmol/L — AB (ref 0.5–1.9)
LACTIC ACID, VENOUS: 1.6 mmol/L (ref 0.5–1.9)

## 2016-12-24 LAB — URINALYSIS, ROUTINE W REFLEX MICROSCOPIC
BILIRUBIN URINE: NEGATIVE
KETONES UR: NEGATIVE mg/dL
LEUKOCYTES UA: NEGATIVE
NITRITE: NEGATIVE
PH: 5 (ref 5.0–8.0)
Protein, ur: 100 mg/dL — AB
SPECIFIC GRAVITY, URINE: 1.009 (ref 1.005–1.030)
SQUAMOUS EPITHELIAL / LPF: NONE SEEN

## 2016-12-24 LAB — MAGNESIUM: Magnesium: 2.1 mg/dL (ref 1.7–2.4)

## 2016-12-24 LAB — TROPONIN I: Troponin I: <0.03 ng/mL (ref <0.03)

## 2016-12-24 MED ORDER — SODIUM CHLORIDE 0.9 % IV BOLUS (SEPSIS)
1000.0000 mL | Freq: Once | INTRAVENOUS | Status: AC
  Administered 2016-12-24: 1000 mL via INTRAVENOUS

## 2016-12-24 MED ORDER — SODIUM CHLORIDE 0.9 % IV SOLN
1500.0000 mg | INTRAVENOUS | Status: AC
  Administered 2016-12-24: 1500 mg via INTRAVENOUS
  Filled 2016-12-24: qty 15

## 2016-12-24 MED ORDER — ACETAMINOPHEN 325 MG PO TABS
650.0000 mg | ORAL_TABLET | ORAL | Status: DC | PRN
  Administered 2016-12-24 – 2016-12-25 (×3): 650 mg via ORAL
  Filled 2016-12-24 (×3): qty 2

## 2016-12-24 MED ORDER — LORAZEPAM 2 MG/ML IJ SOLN
1.0000 mg | Freq: Once | INTRAMUSCULAR | Status: AC
  Administered 2016-12-24: 1 mg via INTRAVENOUS
  Filled 2016-12-24: qty 1

## 2016-12-24 MED ORDER — METOPROLOL TARTRATE 5 MG/5ML IV SOLN
5.0000 mg | Freq: Four times a day (QID) | INTRAVENOUS | Status: DC
  Administered 2016-12-25 (×2): 5 mg via INTRAVENOUS
  Filled 2016-12-24 (×2): qty 5

## 2016-12-24 MED ORDER — SODIUM CHLORIDE 0.9 % IV SOLN
1000.0000 mg | Freq: Two times a day (BID) | INTRAVENOUS | Status: DC
  Administered 2016-12-25: 1000 mg via INTRAVENOUS
  Filled 2016-12-24 (×3): qty 10

## 2016-12-24 MED ORDER — INSULIN ASPART 100 UNIT/ML ~~LOC~~ SOLN
0.0000 [IU] | Freq: Every day | SUBCUTANEOUS | Status: DC
  Administered 2016-12-24: 4 [IU] via SUBCUTANEOUS
  Administered 2016-12-25: 5 [IU] via SUBCUTANEOUS

## 2016-12-24 MED ORDER — INSULIN GLARGINE 100 UNIT/ML ~~LOC~~ SOLN
10.0000 [IU] | Freq: Every day | SUBCUTANEOUS | Status: DC
  Administered 2016-12-24 – 2016-12-25 (×2): 10 [IU] via SUBCUTANEOUS
  Filled 2016-12-24 (×2): qty 0.1

## 2016-12-24 MED ORDER — ONDANSETRON HCL 4 MG/2ML IJ SOLN: 4.0000 mg | Freq: Four times a day (QID) | INTRAMUSCULAR | Status: DC | PRN

## 2016-12-24 MED ORDER — INSULIN ASPART 100 UNIT/ML ~~LOC~~ SOLN
0.0000 [IU] | Freq: Three times a day (TID) | SUBCUTANEOUS | Status: DC
  Administered 2016-12-25 (×2): 2 [IU] via SUBCUTANEOUS
  Administered 2016-12-26: 3 [IU] via SUBCUTANEOUS

## 2016-12-24 MED ORDER — LEVETIRACETAM 500 MG PO TABS: 1000.0000 mg | ORAL_TABLET | Freq: Two times a day (BID) | ORAL | Status: DC

## 2016-12-24 MED ORDER — SODIUM CHLORIDE 0.9 % IV SOLN
75.0000 mL/h | INTRAVENOUS | Status: DC
  Administered 2016-12-24: 75 mL/h via INTRAVENOUS

## 2016-12-24 MED ORDER — ENOXAPARIN SODIUM 40 MG/0.4ML ~~LOC~~ SOLN
40.0000 mg | SUBCUTANEOUS | Status: DC
  Administered 2016-12-24 – 2016-12-25 (×2): 40 mg via SUBCUTANEOUS
  Filled 2016-12-24 (×2): qty 0.4

## 2016-12-24 MED ORDER — ONDANSETRON HCL 4 MG PO TABS: 4.0000 mg | ORAL_TABLET | Freq: Four times a day (QID) | ORAL | Status: DC | PRN

## 2016-12-24 MED ORDER — SODIUM CHLORIDE 0.9 % IV SOLN: 75.0000 mL/h | INTRAVENOUS | Status: DC

## 2016-12-24 MED ORDER — POTASSIUM CHLORIDE 10 MEQ/100ML IV SOLN
10.0000 meq | Freq: Once | INTRAVENOUS | Status: AC
  Administered 2016-12-24: 10 meq via INTRAVENOUS
  Filled 2016-12-24: qty 100

## 2016-12-24 MED ORDER — POTASSIUM CHLORIDE 10 MEQ/100ML IV SOLN: 10.0000 meq | INTRAVENOUS | Status: DC

## 2016-12-24 MED ORDER — LORAZEPAM 2 MG/ML IJ SOLN
1.0000 mg | INTRAMUSCULAR | Status: DC | PRN
Start: 2016-12-24 — End: 2016-12-26

## 2016-12-24 MED ORDER — ACETAMINOPHEN 650 MG RE SUPP: 650.0000 mg | RECTAL | Status: DC | PRN

## 2016-12-24 NOTE — ED Notes (Signed)
Pt continues to be combative at this time.

## 2016-12-24 NOTE — ED Notes (Signed)
Spoke with main lab about patient's outstanding Keppra Level. Laboratory personnel report that this test is sent out to [facility] for processing.

## 2016-12-24 NOTE — ED Notes (Signed)
Called CT and stated they would evaluate when CT was available.

## 2016-12-24 NOTE — ED Notes (Signed)
Pt's CBG 318.  Informed Dahlia ClientHannah, RN.

## 2016-12-24 NOTE — ED Triage Notes (Signed)
Per [facility], Pt is coming from home with complaints of Altered Mental Status and Combative behavior towards EMS. Pt was on the phone with friend when friend believed he was having a seizure. Called 911. Pt was found down on his side by EMS and was combative. Pt is disoriented and fighting arriving to the ED.

## 2016-12-24 NOTE — ED Notes (Signed)
Pt taken to CT.

## 2016-12-24 NOTE — ED Notes (Signed)
EEG at the bedside  ?

## 2016-12-24 NOTE — ED Notes (Signed)
Attempted Report x1.   

## 2016-12-24 NOTE — Procedures (Signed)
History: 46 year old male with seizures  Sedation: Ativan prior to procedure  Technique: This is a 21 channel routine scalp EEG performed at the bedside with bipolar and monopolar montages arranged in accordance to the international 10/20 system of electrode placement. One channel was dedicated to EKG recording.    Background: The background consists of intermixed alpha and beta activities. There is a well defined posterior dominant rhythm of 9 Hz that attenuates with eye opening. There is excessive beta activity throughout the study. There is mild generalized. Delta associated with drowsiness.  Photic stimulation: Physiologic driving is now performed  EEG Abnormalities: None  Clinical Interpretation: This normal EEG is recorded in the waking and drowsy state. The excess beta activity is consistent with medication effect. There was no seizure or seizure predisposition recorded on this study. Please note that a normal EEG does not preclude the possibility of epilepsy.   Ritta SlotMcNeill Cambre Matson, MD [facility] [phone]  If 7pm- 7am, please page neurology on call as listed in AMION.

## 2016-12-24 NOTE — Progress Notes (Signed)
EEG completed; results pending.    

## 2016-12-24 NOTE — Consult Note (Signed)
Neurology Consultation Reason for Consult: Seizure Referring Physician: Rubin Payor, N  CC: Seizure  History is obtained from: Girlfriend  HPI: Miguel Diaz. is a 46 y.o. male who is on the phone with his girlfriend earlier today when he had sudden cessation of speech and she heard him shaking on the other side sounding like he was having a seizure. She then called 911 who brought him into the emergency department. They found him combative. On arrival, he was moving all extremities, but not following commands he subsequently improved and became less agitated and began following commands and neurology is being consulted given that he has not returned to baseline.     ROS:  Unable to obtain due to altered mental status.   Past Medical History:  Diagnosis Date  . Anxiety   . Asthma   . Blindness    bilateral  . Depression   . Diabetes mellitus without complication (HCC)   . Hypercholesteremia   . Neuropathy   . PSVT (paroxysmal supraventricular tachycardia) (HCC) 06/2015  . Seizure (HCC)   . Stroke (HCC) 02/2014   right sided weakness/numbness  . Vision loss of left eye      Family History  Problem Relation Age of Onset  . High blood pressure Mother   . Diabetes Mellitus I Mother   . Congenital heart disease Mother   . High blood pressure Father   . Diabetes Mellitus I Father   . CAD Father 88  . Migraines Daughter   . Parkinsonism Daughter   . Parkinsonism Son   . High blood pressure Sister   . Congenital heart disease Sister   . High blood pressure Paternal Grandfather      Social History:  reports that he has been smoking Cigarettes and E-cigarettes.  He has been smoking about 0.00 packs per day. He has never used smokeless tobacco. He reports that he drinks alcohol. He reports that he does not use drugs.   Exam: Current vital signs: BP 123/77   Pulse (!) 106   Temp 97.8 F (36.6 C) (Temporal)   Resp 19   Ht 5\' 10"  (1.778 m)   Wt 83.9 kg (185 lb)    SpO2 97%   BMI 26.54 kg/m  Vital signs in last 24 hours: Temp:  [97.8 F (36.6 C)] 97.8 F (36.6 C) (06/22 1354) Pulse Rate:  [102-156] 106 (06/22 1900) Resp:  [16-25] 19 (06/22 1900) BP: (101-160)/(66-103) 123/77 (06/22 1900) SpO2:  [92 %-97 %] 97 % (06/22 1900) Weight:  [83.9 kg (185 lb)] 83.9 kg (185 lb) (06/22 1355)   Physical Exam  Constitutional: Appears well-developed and well-nourished.  Psych: Affect appropriate to situation Eyes: No scleral injection HENT: No OP obstrucion Head: Normocephalic.  Cardiovascular: Normal rate and regular rhythm.  Respiratory: Effort normal and breath sounds normal to anterior ascultation GI: Soft.  No distension. There is no tenderness.  Skin: WDI  Neuro: Mental Status: Patient is markedly agitated, he does follow commands to stick out his tongue and wiggle toes. Cranial Nerves: II: He does not clearly blink to threat from either direction but he does fixate and track Pupils are equal, round, and reactive to light.   III,IV, VI: He tracks across midline both directions V: Response to stimulation bilaterally VII: Facial movement is symmetric.  VIII: hearing is intact to voice X: Uvula elevates symmetrically XI: Shoulder shrug is symmetric. XII: tongue is midline without atrophy or fasciculations.  Motor: He moves all extremities with good strength Sensory:  Response to noxious stimulation in all 4 extremities Cerebellar: He does not comply    I have reviewed labs in epic and the results pertinent to this consultation are: CBG 318 Lactic acid 16  I have reviewed the images obtained: CT head-negative  Impression: 46 year old male with breakthrough seizure. Unclear if he has been compliant and therefore I will send levetiracetam level. In the interim, I will increase his Keppra 1000 mg daily(this dosing is per girlfriend) to 1000 mg twice a day  Recommendations: 1) Keppra 1500 mg 1 increase Keppra to 1000 mg twice a day 2)  EEG 3) neurology will continue to follow  Ritta SlotMcNeill Hazelynn Mckenny, MD [facility] [phone]  If 7pm- 7am, please page neurology on call as listed in AMION.

## 2016-12-24 NOTE — ED Provider Notes (Signed)
[facility]-EMERGENCY DEPT Provider Note   CSN: [id number] Arrival date & time: 12/24/16  1349     History   Chief Complaint Chief Complaint  Patient presents with  . Fall  . Altered Mental Status   Level V caveat due to altered mental status. HPI Miguel Diaz. is a 46 y.o. male.  HPI Patient presents with altered mental status. He was on the phone with his girlfriend when he stopped talking and then she thought she could hear him having a seizure. Does have history of seizures. EMS called and he was unresponsive. Tachycardic. Combative. Moving all extremities. Had not been hypotensive or hypoxic. Had 5 of Versed by EMS. Difficulty getting IV line for them. Past Medical History:  Diagnosis Date  . Anxiety   . Asthma   . Blindness    bilateral  . Depression   . Diabetes mellitus without complication (HCC)   . Hypercholesteremia   . Neuropathy   . PSVT (paroxysmal supraventricular tachycardia) (HCC) 06/2015  . Stroke (HCC) 02/2014   right sided weakness/numbness  . Vision loss of left eye     Patient Active Problem List   Diagnosis Date Noted  . Hypokalemia 11/15/2016  . Pain in the chest   . Type 2 diabetes mellitus without complication, with long-term current use of insulin (HCC)   . Hyperlipidemia   . Tobacco use disorder   . Chest pain 12/14/2015  . Episode of altered consciousness 01/15/2015  . Encephalopathy acute 12/18/2014  . Altered mental status   . LOC (loss of consciousness) (HCC)   . 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 Encephalopathy 08/01/2014  . Migraine 07/31/2014  . 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 Orthostatic hypotension 06/03/2014  . 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 Cerebral thrombosis with cerebral infarction (HCC) 05/20/2014  . Right sided weakness 05/19/2014  . Stroke (HCC)   . DM type 2 with diabetic peripheral neuropathy (HCC) 04/09/2014  . Cerebral  infarction due to thrombosis of cerebral artery (HCC) 03/27/2014  . Essential hypertension, benign 03/27/2014  . HLD (hyperlipidemia) 03/27/2014  . Type II or unspecified type diabetes mellitus with peripheral circulatory disorders, uncontrolled(250.72) 02/12/2014  . Tobacco abuse 02/12/2014  . CVA (cerebral infarction) 02/11/2014  . Syncope 01/25/2014  . Hip pain 01/25/2014  . SAH (subarachnoid hemorrhage) (HCC) 01/25/2014  . Vision, loss, sudden 01/25/2014  . Type II or unspecified type diabetes mellitus without mention of complication, uncontrolled 01/25/2014    Past Surgical History:  Procedure Laterality Date  . ABDOMINAL SURGERY    . EYE SURGERY Left   . HERNIA REPAIR         Home Medications    Prior to Admission medications   Medication Sig Start Date End Date Taking? Authorizing Provider  albuterol (PROVENTIL HFA;VENTOLIN HFA) 108 (90 Base) MCG/ACT inhaler Inhale 2 puffs into the lungs every 6 (six) hours as needed for wheezing or shortness of breath. 11/16/16   Joseph Art, DO  aspirin 325 MG tablet Take 1 tablet (325 mg total) by mouth daily. 05/21/14   Joseph Art, DO  atorvastatin (LIPITOR) 40 MG tablet Take 1 tablet (40 mg total) by mouth at bedtime. 11/16/16   Joseph Art, DO  FLUoxetine (PROZAC) 40 MG capsule Take 1 capsule (40 mg total) by mouth daily. 11/16/16   Joseph Art, DO  fluticasone (FLONASE) 50 MCG/ACT nasal spray Place 1 spray into both nostrils daily as needed  for allergies or rhinitis. 11/16/16   Joseph Art, DO  glipiZIDE (GLUCOTROL XL) 10 MG 24 hr tablet Take 1 tablet (10 mg total) by mouth daily with breakfast. 11/16/16   Joseph Art, DO  HYDROcodone-acetaminophen (NORCO/VICODIN) 5-325 MG tablet Take 1 tablet by mouth every 6 (six) hours as needed. Patient taking differently: Take 1 tablet by mouth every 6 (six) hours as needed.  02/19/16   Horton, Mayer Masker, MD  ibuprofen (ADVIL,MOTRIN) 600 MG tablet Take 600 mg by mouth every 6  (six) hours as needed for moderate pain.    [provider]  insulin glargine (LANTUS) 100 UNIT/ML injection Inject 0.1 mLs (10 Units total) into the skin at bedtime. 11/16/16   Joseph Art, DO  levETIRAcetam (KEPPRA) 1000 MG tablet Take 1 tablet (1,000 mg total) by mouth 2 (two) times daily. 11/16/16   Joseph Art, DO  meclizine (ANTIVERT) 25 MG tablet Take 1 tablet (25 mg total) by mouth 3 (three) times daily as needed for dizziness. 11/16/16   Joseph Art, DO  metFORMIN (GLUCOPHAGE-XR) 500 MG 24 hr tablet Take 2 tablets (1,000 mg total) by mouth 2 (two) times daily. 11/16/16   Joseph Art, DO  metoprolol succinate (TOPROL-XL) 50 MG 24 hr tablet Take 1 tablet (50 mg total) by mouth daily. Take with or immediately following a meal. 11/17/16   Joseph Art, DO  Multiple Vitamins-Minerals (MULTIVITAMIN WITH MINERALS) tablet Take 1 tablet by mouth daily.    [provider]  pregabalin (LYRICA) 150 MG capsule Take 1 capsule (150 mg total) by mouth 2 (two) times daily. 12/19/14   Vassie Loll, MD    Family History Family History  Problem Relation Age of Onset  . High blood pressure Mother   . Diabetes Mellitus I Mother   . Congenital heart disease Mother   . High blood pressure Father   . Diabetes Mellitus I Father   . CAD Father 41  . Migraines Daughter   . Parkinsonism Daughter   . Parkinsonism Son   . High blood pressure Sister   . Congenital heart disease Sister   . High blood pressure Paternal Grandfather     Social History Social History  Substance Use Topics  . Smoking status: Current Every Day Smoker    Packs/day: 0.00    Types: Cigarettes, E-cigarettes  . Smokeless tobacco: Never Used     Comment: he does electonic cigarettes sometmes too  . Alcohol use 0.0 oz/week     Allergies   Penicillins and Toradol [ketorolac tromethamine]   Review of Systems Review of Systems  Unable to perform ROS: Mental status change     Physical  Exam Updated Vital Signs BP (!) 133/94   Pulse (!) 125   Temp 97.8 F (36.6 C) (Temporal)   Resp (!) 22   Ht 5\' 10"  (1.778 m)   Wt 83.9 kg (185 lb)   SpO2 93%   BMI 26.54 kg/m   Physical Exam  Constitutional: He appears well-developed.  HENT:  Head: Atraumatic.  Eyes:  Pupils around 4 mm and reactive. Initially will not look to voice.  Neck:  Cervical collar in place.  Cardiovascular:  Tachycardia  Abdominal: Soft. There is no tenderness.  Musculoskeletal: He exhibits no edema.  Neurological:  Patient is combative. Nonverbal. Initially not following commands but on recheck later will squeeze hands to commands will stick out tongue to commands. Will look to voice.  Skin: Skin is warm. Capillary refill  takes less than 2 seconds.     ED Treatments / Results  Labs (all labs ordered are listed, but only abnormal results are displayed) Labs Reviewed  CBC WITH DIFFERENTIAL/PLATELET - Abnormal; Notable for the following:       Result Value   WBC 14.1 (*)    Hemoglobin 17.9 (*)    Lymphs Abs 5.3 (*)    All other components within normal limits  COMPREHENSIVE METABOLIC PANEL - Abnormal; Notable for the following:    Potassium 3.3 (*)    Chloride 99 (*)    CO2 11 (*)    Glucose, Bld 388 (*)    BUN <5 (*)    Alkaline Phosphatase 133 (*)    Anion gap 25 (*)    All other components within normal limits  URINALYSIS, ROUTINE W REFLEX MICROSCOPIC - Abnormal; Notable for the following:    Color, Urine STRAW (*)    Glucose, UA >=500 (*)    Hgb urine dipstick SMALL (*)    Protein, ur 100 (*)    Bacteria, UA RARE (*)    All other components within normal limits  I-STAT CG4 LACTIC ACID, ED - Abnormal; Notable for the following:    Lactic Acid, Venous 16.07 (*)    All other components within normal limits  ETHANOL  CK  TROPONIN I  RAPID URINE DRUG SCREEN, HOSP PERFORMED  LEVETIRACETAM LEVEL    EKG  EKG Interpretation  Date/Time:  Friday December 24 2016 13:50:05  EDT Ventricular Rate:  160 PR Interval:    QRS Duration: 102 QT Interval:  315 QTC Calculation: 514 R Axis:   -103 Text Interpretation:  Sinus tachycardia Ventricular premature complex Probable left atrial enlargement Markedly posterior QRS axis Borderline low voltage, extremity leads Artifact in lead(s) I III aVR aVL aVF Confirmed by Rubin Payor  MD, Tonny Isensee [phone]) on 12/24/2016 3:29:27 PM       Radiology Dg Chest Portable 1 View  Result Date: 12/24/2016 CLINICAL DATA:  Altered mental status, post seizure EXAM: PORTABLE CHEST 1 VIEW COMPARISON:  CT chest dated 11/15/2016 FINDINGS: Lungs are clear.  No pleural effusion or pneumothorax. The heart is normal in size. IMPRESSION: No evidence of acute cardiopulmonary disease. Electronically Signed   By: Charline Bills M.D.   On: 12/24/2016 14:44    Procedures Procedures (including critical care time)  Medications Ordered in ED Medications  levETIRAcetam (KEPPRA) 1,500 mg in sodium chloride 0.9 % 100 mL IVPB (not administered)  sodium chloride 0.9 % bolus 1,000 mL (not administered)  LORazepam (ATIVAN) injection 1 mg (1 mg Intravenous Given 12/24/16 1359)  LORazepam (ATIVAN) injection 1 mg (1 mg Intravenous Given 12/24/16 1412)  LORazepam (ATIVAN) injection 1 mg (1 mg Intravenous Given 12/24/16 1417)     Initial Impression / Assessment and Plan / ED Course  I have reviewed the triage vital signs and the nursing notes.  Pertinent labs & imaging results that were available during my care of the patient were reviewed by me and considered in my medical decision making (see chart for details).     Patient resents with altered mental status and combativeness. Had been incontinent of urine and likely had a seizure. History of same. Has had some questionable compliance in the past. Discussed with patient's girlfriend. Required 3 doses of Ativan here to help calm him down. Initial lactic acid 16. Not febrile. I think the lactic acidosis likely  due to post seizure and combativeness. Will get [name]. Will likely require admission was  mental status returned to baseline. Seen in the ER by neurology.Care turned over to Dr Fayrene FearingJames.  CRITICAL CARE Performed by: Billee CashingPICKERING,Kane Kusek R. Total critical care time: 30 minutes Critical care time was exclusive of separately billable procedures and treating other patients. Critical care was necessary to treat or prevent imminent or life-threatening deterioration. Critical care was time spent personally by me on the following activities: development of treatment plan with patient and/or surrogate as well as nursing, discussions with consultants, evaluation of patient's response to treatment, examination of patient, obtaining history from patient or surrogate, ordering and performing treatments and interventions, ordering and review of laboratory studies, ordering and review of radiographic studies, pulse oximetry and re-evaluation of patient's condition.     Final Clinical Impressions(s) / ED Diagnoses   Final diagnoses:  Seizure (HCC)  Lactic acidosis    New Prescriptions New Prescriptions   No medications on file     Benjiman CorePickering, Maribell Demeo, MD 12/24/16 1531

## 2016-12-24 NOTE — ED Notes (Signed)
Attempted Report x2. 

## 2016-12-24 NOTE — Consult Note (Deleted)
Please see other consult note

## 2016-12-24 NOTE — H&P (Signed)
History and Physical    Miguel Diaz. ZOX:[id number] DOB: [birth year] DOA: 12/24/2016  PCP: System, Provider Not In Patient coming from: home  Chief Complaint: acute encephalopathy  HPI: Miguel Diaz. is a 46 y.o. male with medical history significant for diabetes type 2, CVA, left eye blindness, seizure disorder, tobacco use presents to the emergency Department chief complaint of acute encephalopathy. Upon presentation patient is altered and combative concerning for seizure.  Information is obtained from the chart and the girlfriend who is at the bedside. She reports this afternoon she was talking to the patient on telephone when all of a sudden he stopped talking and she felt like she heard him having a seizure. EMS was called and reportedly he was found down was confused/combative. He required 3 doses of Ativan. He had been incontinent of urine. Girlfriend reports patient has a history of noncompliance.Girlfriend denies patient has had any recent illnesses. No headache dizziness fever chills diarrhea dysuria hematuria frequency or urgency . No nausea vomiting.     ED Course: In the emergency department is afebrile hypertensive tachycardic with a heart rate of 160 mild tachypnea not hypoxic. He is evaluated by neurology who provide loading dose of Keppra recommend admission. On admission he is afebrile hemodynamically stable heart rate of 110 remains quite lethargic but easily aroused  Review of Systems: As per HPI otherwise all other systems reviewed and are negative.   Ambulatory Status: Ambulates with a walker. Lives alone  Past Medical History:  Diagnosis Date  . Anxiety   . Asthma   . Blindness    bilateral  . Depression   . Diabetes mellitus without complication (HCC)   . Hypercholesteremia   . Neuropathy   . PSVT (paroxysmal supraventricular tachycardia) (HCC) 06/2015  . Seizure (HCC)   . Stroke (HCC) 02/2014   right sided weakness/numbness  . Vision loss of  left eye     Past Surgical History:  Procedure Laterality Date  . ABDOMINAL SURGERY    . EYE SURGERY Left   . HERNIA REPAIR      Social History   Social History  . Marital status: Significant Other    Spouse name: N/A  . Number of children: 2  . Years of education: 9th   Occupational History  . disabled    Social History Main Topics  . Smoking status: Current Every Day Smoker    Packs/day: 0.00    Types: Cigarettes, E-cigarettes  . Smokeless tobacco: Never Used     Comment: he does electonic cigarettes sometmes too  . Alcohol use 0.0 oz/week  . Drug use: No  . Sexual activity: Not Currently   Other Topics Concern  . Not on file   Social History Narrative   ** Merged History Encounter **   Patient  Lives with his girl friend   Patient is right handed   Patient coffee daily        Allergies  Allergen Reactions  . Penicillins Other (See Comments)    Unknown childhood reaction  . Toradol [Ketorolac Tromethamine] Hives    Family History  Problem Relation Age of Onset  . High blood pressure Mother   . Diabetes Mellitus I Mother   . Congenital heart disease Mother   . High blood pressure Father   . Diabetes Mellitus I Father   . CAD Father 45  . Migraines Daughter   . Parkinsonism Daughter   . Parkinsonism Son   . High blood pressure Sister   .  Congenital heart disease Sister   . High blood pressure Paternal Grandfather     Prior to Admission medications   Medication Sig Start Date End Date Taking? Authorizing Provider  albuterol (PROVENTIL HFA;VENTOLIN HFA) 108 (90 Base) MCG/ACT inhaler Inhale 2 puffs into the lungs every 6 (six) hours as needed for wheezing or shortness of breath. 11/16/16   Joseph Art, DO  aspirin 325 MG tablet Take 1 tablet (325 mg total) by mouth daily. 05/21/14   Joseph Art, DO  atorvastatin (LIPITOR) 40 MG tablet Take 1 tablet (40 mg total) by mouth at bedtime. 11/16/16   Joseph Art, DO  FLUoxetine (PROZAC) 40 MG  capsule Take 1 capsule (40 mg total) by mouth daily. 11/16/16   Joseph Art, DO  fluticasone (FLONASE) 50 MCG/ACT nasal spray Place 1 spray into both nostrils daily as needed for allergies or rhinitis. 11/16/16   Joseph Art, DO  glipiZIDE (GLUCOTROL XL) 10 MG 24 hr tablet Take 1 tablet (10 mg total) by mouth daily with breakfast. 11/16/16   Joseph Art, DO  HYDROcodone-acetaminophen (NORCO/VICODIN) 5-325 MG tablet Take 1 tablet by mouth every 6 (six) hours as needed. Patient taking differently: Take 1 tablet by mouth every 6 (six) hours as needed.  02/19/16   Horton, Mayer Masker, MD  ibuprofen (ADVIL,MOTRIN) 600 MG tablet Take 600 mg by mouth every 6 (six) hours as needed for moderate pain.    [provider]  insulin glargine (LANTUS) 100 UNIT/ML injection Inject 0.1 mLs (10 Units total) into the skin at bedtime. 11/16/16   Joseph Art, DO  levETIRAcetam (KEPPRA) 1000 MG tablet Take 1 tablet (1,000 mg total) by mouth 2 (two) times daily. 11/16/16   Joseph Art, DO  meclizine (ANTIVERT) 25 MG tablet Take 1 tablet (25 mg total) by mouth 3 (three) times daily as needed for dizziness. 11/16/16   Joseph Art, DO  metFORMIN (GLUCOPHAGE-XR) 500 MG 24 hr tablet Take 2 tablets (1,000 mg total) by mouth 2 (two) times daily. 11/16/16   Joseph Art, DO  metoprolol succinate (TOPROL-XL) 50 MG 24 hr tablet Take 1 tablet (50 mg total) by mouth daily. Take with or immediately following a meal. 11/17/16   Joseph Art, DO  Multiple Vitamins-Minerals (MULTIVITAMIN WITH MINERALS) tablet Take 1 tablet by mouth daily.    [provider]  pregabalin (LYRICA) 150 MG capsule Take 1 capsule (150 mg total) by mouth 2 (two) times daily. 12/19/14   Vassie Loll, MD    Physical Exam: Vitals:   12/24/16 1515 12/24/16 1530 12/24/16 1545 12/24/16 1600  BP: (!) 133/94 (!) 133/101 (!) 139/95 (!) 138/99  Pulse:  (!) 117 (!) 113 (!) 117  Resp: (!) 22 17 20  (!) 22  Temp:      TempSrc:        SpO2:  92% 95% 93%  Weight:      Height:         General:  Lying in bed eyes closed will moves extremities spontaneously but does not open eyes. Lethargic response to light touch Eyes:  PERRL, EOMI, normal lids, iris ENT:  grossly normal hearing, lips & tongue, mucous membranes of his mouth are somewhat dry slightly pale Neck:  no LAD, masses or thyromegaly Cardiovascular:  Tachycardic, no m/r/g. No LE edema.  Respiratory:  CTA bilaterally, no w/r/r. Normal respiratory effort. Abdomen:  soft, ntnd, positive bowel sounds throughout no guarding or rebounding Skin:  no rash or  induration seen on limited exam Musculoskeletal:  grossly normal tone BUE/BLE, good ROM, no bony abnormality Psychiatric:  grossly normal mood and affect, speech fluent and appropriate, AOx3 Neurologic:    Labs on Admission: I have personally reviewed following labs and imaging studies  CBC:  Recent Labs Lab 12/24/16 1352  WBC 14.1*  NEUTROABS 7.5  HGB 17.9*  HCT 50.4  MCV 91.8  PLT 211   Basic Metabolic Panel:  Recent Labs Lab 12/24/16 1352  NA 135  K 3.3*  CL 99*  CO2 11*  GLUCOSE 388*  BUN <5*  CREATININE 0.94  CALCIUM 9.1   GFR: Estimated Creatinine Clearance: 104.6 mL/min (by C-G formula based on SCr of 0.94 mg/dL). Liver Function Tests:  Recent Labs Lab 12/24/16 1352  AST 36  ALT 21  ALKPHOS 133*  BILITOT 0.7  PROT 7.4  ALBUMIN 4.5   No results for input(s): LIPASE, AMYLASE in the last 168 hours. No results for input(s): AMMONIA in the last 168 hours. Coagulation Profile: No results for input(s): INR, PROTIME in the last 168 hours. Cardiac Enzymes:  Recent Labs Lab 12/24/16 1352  CKTOTAL 96  TROPONINI <0.03   BNP (last 3 results) No results for input(s): PROBNP in the last 8760 hours. HbA1C: No results for input(s): HGBA1C in the last 72 hours. CBG: No results for input(s): GLUCAP in the last 168 hours. Lipid Profile: No results for input(s): CHOL, HDL,  LDLCALC, TRIG, CHOLHDL, LDLDIRECT in the last 72 hours. Thyroid Function Tests: No results for input(s): TSH, T4TOTAL, FREET4, T3FREE, THYROIDAB in the last 72 hours. Anemia Panel: No results for input(s): VITAMINB12, FOLATE, FERRITIN, TIBC, IRON, RETICCTPCT in the last 72 hours. Urine analysis:    Component Value Date/Time   COLORURINE STRAW (A) 12/24/2016 1412   APPEARANCEUR CLEAR 12/24/2016 1412   LABSPEC 1.009 12/24/2016 1412   PHURINE 5.0 12/24/2016 1412   GLUCOSEU >=500 (A) 12/24/2016 1412   HGBUR SMALL (A) 12/24/2016 1412   BILIRUBINUR NEGATIVE 12/24/2016 1412   KETONESUR NEGATIVE 12/24/2016 1412   PROTEINUR 100 (A) 12/24/2016 1412   UROBILINOGEN 0.2 12/18/2014 0004   NITRITE NEGATIVE 12/24/2016 1412   LEUKOCYTESUR NEGATIVE 12/24/2016 1412    Creatinine Clearance: Estimated Creatinine Clearance: 104.6 mL/min (by C-G formula based on SCr of 0.94 mg/dL).  Sepsis Labs: @LABRCNTIP (procalcitonin:4,lacticidven:4) )No results found for this or any previous visit (from the past 240 hour(s)).   Radiological Exams on Admission: Ct Head Wo Contrast  Result Date: 12/24/2016 CLINICAL DATA:  Altered mental status and possible seizure EXAM: CT HEAD WITHOUT CONTRAST CT CERVICAL SPINE WITHOUT CONTRAST TECHNIQUE: Multidetector CT imaging of the head and cervical spine was performed following the standard protocol without intravenous contrast. Multiplanar CT image reconstructions of the cervical spine were also generated. COMPARISON:  [name] 07/30/2016 Brain [name] 07/30/2016 FINDINGS: Despite efforts by the technologist and patient, motion artifact is present on today's examination and could not be eliminated. The findings of this study are interpreted in the context of that limitation. CT HEAD FINDINGS Brain: No mass lesion, intraparenchymal hemorrhage or extra-axial collection. No evidence of acute cortical infarct. There is age advanced atrophy in an unchanged pattern compared to prior study.  There is periventricular hypoattenuation compatible with chronic microvascular disease. Vascular: Atherosclerotic calcification of the internal carotid arteries at the skull base. No hyperdense vessel sign. Skull: Normal visualized skull base, calvarium and extracranial soft tissues. Sinuses/Orbits: Partial sphenoid sinus opacification. No mastoid or middle ear effusion. Normal orbits. CT CERVICAL SPINE FINDINGS Alignment: No  static subluxation. Facets are aligned. Occipital condyles are normally positioned. Skull base and vertebrae: No acute fracture. Soft tissues and spinal canal: No prevertebral fluid or swelling. No visible canal hematoma. Disc levels: No advanced spinal canal or neural foraminal stenosis. Upper chest: No pneumothorax, pulmonary nodule or pleural effusion. Other: Normal visualized paraspinal cervical soft tissues. IMPRESSION: 1. Motion degraded examination. 2. Within that limitation, no acute abnormality of the brain or cervical spine. 3. Age advanced atrophy and chronic microvascular ischemia. Electronically Signed   By: Deatra Robinson M.D.   On: 12/24/2016 15:40   Ct Cervical Spine Wo Contrast  Result Date: 12/24/2016 CLINICAL DATA:  Altered mental status and possible seizure EXAM: CT HEAD WITHOUT CONTRAST CT CERVICAL SPINE WITHOUT CONTRAST TECHNIQUE: Multidetector CT imaging of the head and cervical spine was performed following the standard protocol without intravenous contrast. Multiplanar CT image reconstructions of the cervical spine were also generated. COMPARISON:  [name] 07/30/2016 Brain [name] 07/30/2016 FINDINGS: Despite efforts by the technologist and patient, motion artifact is present on today's examination and could not be eliminated. The findings of this study are interpreted in the context of that limitation. CT HEAD FINDINGS Brain: No mass lesion, intraparenchymal hemorrhage or extra-axial collection. No evidence of acute cortical infarct. There is age advanced atrophy in an  unchanged pattern compared to prior study. There is periventricular hypoattenuation compatible with chronic microvascular disease. Vascular: Atherosclerotic calcification of the internal carotid arteries at the skull base. No hyperdense vessel sign. Skull: Normal visualized skull base, calvarium and extracranial soft tissues. Sinuses/Orbits: Partial sphenoid sinus opacification. No mastoid or middle ear effusion. Normal orbits. CT CERVICAL SPINE FINDINGS Alignment: No static subluxation. Facets are aligned. Occipital condyles are normally positioned. Skull base and vertebrae: No acute fracture. Soft tissues and spinal canal: No prevertebral fluid or swelling. No visible canal hematoma. Disc levels: No advanced spinal canal or neural foraminal stenosis. Upper chest: No pneumothorax, pulmonary nodule or pleural effusion. Other: Normal visualized paraspinal cervical soft tissues. IMPRESSION: 1. Motion degraded examination. 2. Within that limitation, no acute abnormality of the brain or cervical spine. 3. Age advanced atrophy and chronic microvascular ischemia. Electronically Signed   By: Deatra Robinson M.D.   On: 12/24/2016 15:40   Dg Chest Portable 1 View  Result Date: 12/24/2016 CLINICAL DATA:  Altered mental status, post seizure EXAM: PORTABLE CHEST 1 VIEW COMPARISON:  CT chest dated 11/15/2016 FINDINGS: Lungs are clear.  No pleural effusion or pneumothorax. The heart is normal in size. IMPRESSION: No evidence of acute cardiopulmonary disease. Electronically Signed   By: Charline Bills M.D.   On: 12/24/2016 14:44    EKG: Independently reviewed. Sinus tachycardia Ventricular premature complex Probable left atrial enlargement Markedly posterior QRS axis Borderline low voltage, extremity leads  Assessment/Plan Principal Problem:   Encephalopathy acute Active Problems:   Tobacco abuse   Essential hypertension, benign   HLD (hyperlipidemia)   DM type 2 with diabetic peripheral neuropathy (HCC)    Seizure (HCC)   Tachycardia   Elevated lactic acid level   #1. Acute encephalopathy likely related to seizure. Patient with a history of noncompliance. CT of the head no acute abnormality. Chest x-ray without acute cardiopulmonary disease. No signs of infectious process. Urine drug screen negative. CK within the limits of normal. Evaluated by neurology in the emergency department who opined likely seizure. He was provided with a loading dose of Keppra. Admission recommended -Admit to step down -Follow EEG -Resume dose as recommended -Seizure precautions -obtain B12 and folate  #  2. Tachycardia. Heart rate 160 upon presentation. Likely related to above. Provided with Ativan Keppra IV fluids. Home medications include metoprolol. On admission heart rate 110. EKG with sinus tach  PVCs. -Continue IV fluids -metoprolol IV until alert enough to resume home meds  #3. Diabetes. Type II. Serum glucose 388 on admission. Home medications include glipizide, Lantus -Continue Lantus -Hold glipizide -Sliding scale insulin for optimal control -Obtain a hemoglobin A1c  4. Seizure. Patient with a history of seizure disorder. Home medications include Keppra. Has a history of noncompliance -See #1 -Appreciate neurology assistance  #5. Elevated lactic acid. Likely related to above. Lactic acid 16.07. She is afebrile no leukocytosis -See above -Vigorous IV fluids -Track lactic acid  #6. Hypertension. Blood pressure on the high end of normal in the emergency department. Home medications include metoprolol. -IV metoprolol while patient lethargic with parameters -Monitor closely -Gentle IV fluids    DVT prophylaxis: lovenox Code Status: full  Family Communication: girlfriend at bedside  Disposition Plan: home  Consults called: neurology  Admission status: obs   Gwenyth Bender MD [facility]  If 7PM-7AM, please contact night-coverage [email or web address] Password [facility]  12/24/2016, 4:55 PM

## 2016-12-24 NOTE — ED Notes (Signed)
Paged Admitting MD about patient's request for right shoulder Xray

## 2016-12-24 NOTE — ED Notes (Signed)
Neurology at the bedside

## 2016-12-25 DIAGNOSIS — G9340 Encephalopathy, unspecified: Secondary | ICD-10-CM

## 2016-12-25 DIAGNOSIS — R569 Unspecified convulsions: Secondary | ICD-10-CM | POA: Diagnosis not present

## 2016-12-25 DIAGNOSIS — E1142 Type 2 diabetes mellitus with diabetic polyneuropathy: Secondary | ICD-10-CM

## 2016-12-25 DIAGNOSIS — I10 Essential (primary) hypertension: Secondary | ICD-10-CM | POA: Diagnosis not present

## 2016-12-25 DIAGNOSIS — E785 Hyperlipidemia, unspecified: Secondary | ICD-10-CM

## 2016-12-25 LAB — MRSA PCR SCREENING: MRSA BY PCR: NEGATIVE

## 2016-12-25 LAB — BASIC METABOLIC PANEL
Anion gap: 9 (ref 5–15)
BUN: 5 mg/dL — AB (ref 6–20)
CHLORIDE: 101 mmol/L (ref 101–111)
CO2: 27 mmol/L (ref 22–32)
CREATININE: 0.72 mg/dL (ref 0.61–1.24)
Calcium: 8.3 mg/dL — ABNORMAL LOW (ref 8.9–10.3)
GFR calc Af Amer: >60 mL/min (ref >60)
GFR calc non Af Amer: >60 mL/min (ref >60)
Glucose, Bld: 173 mg/dL — ABNORMAL HIGH (ref 65–99)
Potassium: 3.4 mmol/L — ABNORMAL LOW (ref 3.5–5.1)
SODIUM: 137 mmol/L (ref 135–145)

## 2016-12-25 LAB — CBC
HCT: 42.1 % (ref 39.0–52.0)
HEMOGLOBIN: 15 g/dL (ref 13.0–17.0)
MCH: 32.3 pg (ref 26.0–34.0)
MCHC: 35.6 g/dL (ref 30.0–36.0)
MCV: 90.5 fL (ref 78.0–100.0)
Platelets: 129 10*3/uL — ABNORMAL LOW (ref 150–400)
RBC: 4.65 MIL/uL (ref 4.22–5.81)
RDW: 12.5 % (ref 11.5–15.5)
WBC: 8.5 10*3/uL (ref 4.0–10.5)

## 2016-12-25 LAB — GLUCOSE, CAPILLARY
GLUCOSE-CAPILLARY: 186 mg/dL — AB (ref 65–99)
GLUCOSE-CAPILLARY: 186 mg/dL — AB (ref 65–99)
GLUCOSE-CAPILLARY: 251 mg/dL — AB (ref 65–99)
GLUCOSE-CAPILLARY: 402 mg/dL — AB (ref 65–99)
Glucose-Capillary: 201 mg/dL — ABNORMAL HIGH (ref 65–99)

## 2016-12-25 LAB — HIV ANTIBODY (ROUTINE TESTING W REFLEX): HIV Screen 4th Generation wRfx: NONREACTIVE

## 2016-12-25 MED ORDER — MULTI-VITAMIN/MINERALS PO TABS: 1.0000 | ORAL_TABLET | Freq: Every day | ORAL | Status: DC

## 2016-12-25 MED ORDER — FLUOXETINE HCL 40 MG PO CAPS: 40.0000 mg | ORAL_CAPSULE | Freq: Every day | ORAL | Status: DC

## 2016-12-25 MED ORDER — LEVETIRACETAM 500 MG PO TABS
1500.0000 mg | ORAL_TABLET | Freq: Two times a day (BID) | ORAL | Status: DC
  Administered 2016-12-25 – 2016-12-26 (×3): 1500 mg via ORAL
  Filled 2016-12-25: qty 3
  Filled 2016-12-25: qty 2
  Filled 2016-12-25: qty 3

## 2016-12-25 MED ORDER — ATORVASTATIN CALCIUM 40 MG PO TABS
40.0000 mg | ORAL_TABLET | Freq: Every day | ORAL | Status: DC
  Administered 2016-12-25: 40 mg via ORAL
  Filled 2016-12-25: qty 1

## 2016-12-25 MED ORDER — METOPROLOL SUCCINATE ER 50 MG PO TB24
50.0000 mg | ORAL_TABLET | Freq: Every day | ORAL | Status: DC
  Administered 2016-12-25 – 2016-12-26 (×2): 50 mg via ORAL
  Filled 2016-12-25 (×2): qty 1

## 2016-12-25 MED ORDER — FLUOXETINE HCL 20 MG PO CAPS
40.0000 mg | ORAL_CAPSULE | Freq: Every day | ORAL | Status: DC
  Administered 2016-12-25 – 2016-12-26 (×2): 40 mg via ORAL
  Filled 2016-12-25 (×2): qty 2

## 2016-12-25 MED ORDER — ADULT MULTIVITAMIN W/MINERALS CH
1.0000 | ORAL_TABLET | Freq: Every day | ORAL | Status: DC
  Administered 2016-12-25 – 2016-12-26 (×2): 1 via ORAL
  Filled 2016-12-25 (×2): qty 1

## 2016-12-25 MED ORDER — FLUTICASONE PROPIONATE 50 MCG/ACT NA SUSP
1.0000 | Freq: Every day | NASAL | Status: DC | PRN
  Filled 2016-12-25: qty 16

## 2016-12-25 MED ORDER — TRAMADOL HCL 50 MG PO TABS
50.0000 mg | ORAL_TABLET | Freq: Four times a day (QID) | ORAL | Status: DC | PRN
  Administered 2016-12-25 (×2): 50 mg via ORAL
  Filled 2016-12-25 (×2): qty 1

## 2016-12-25 MED ORDER — LEVETIRACETAM 500 MG PO TABS
500.0000 mg | ORAL_TABLET | Freq: Once | ORAL | Status: AC
  Administered 2016-12-25: 500 mg via ORAL
  Filled 2016-12-25: qty 1

## 2016-12-25 NOTE — Progress Notes (Signed)
Subjective: Muc improved overnight.   Exam: Vitals:   12/25/16 0500 12/25/16 0700  BP:    Pulse:  83  Resp:    Temp: 97.7 F (36.5 C)    Gen: In bed, NAD Resp: non-labored breathing, no acute distress Abd: soft, nt  Neuro: MS: awakens to mild stimuli, oriented x 3. BM:[id number]:decreased vision in left eye(chronic) but pupils are reactive bilaterally.  Motor: moves all extremities well  Sensory: intact to LT  Pertinent Labs: Elevated glucose.   Impression: 46 yo M with breakthrough seizure. No clear precipitant, elevated lactate and WBC likely 2/2 seizure. At this time, I would recommend increasing keppra.   Recommendations: 1) increase keppra to 1500mg  BID, I have changed this to oral, will give one dose of 500mg  as he already received his meds this AM.  2) No further recommendations at this time, please call with further questions or concerns.    Ritta SlotMcNeill Caylan Chenard, MD [facility] [phone]  If 7pm- 7am, please page neurology on call as listed in AMION.

## 2016-12-25 NOTE — Progress Notes (Signed)
Patient ID: Miguel CoeGeorge L Daffin Jr., male   DOB: 03-Mar-1971, 46 y.o.   MRN: [id number]     PROGRESS NOTE    Miguel CoeGeorge L Radin Jr.  BJY:[id number] DOB: 03-Mar-1971 DOA: 12/24/2016  PCP: System, Provider Not In   Brief Narrative:  46 yo male with diabetes, hx of CVA, seizure disorder, presented [facility] with acute encephalopathy, concern for seizure.   Assessment & Plan:   Principal Problem:   Encephalopathy acute - in the setting of breakthrough seizure - pt looks better this AM, clinically stable - neurologist recommended Keppra 1500 mg BID, changed to oral  - no further recommendations per neurology team  - plan transfer to med unit   Active Problems:   Essential hypertension, benign - change Metoprolol IV to home regimen PO     HLD (hyperlipidemia) - resume home regimen with statin     DM type 2 with diabetic peripheral neuropathy (HCC) - keep on Lantus and SSI    Thrombocytopenia - likely reactive - monitor for now    Leukocytosis - reactive - resolved     Hypokalemia - supplement and repeat BMP in AM  DVT prophylaxis: Lovenox SQ Code Status: Full  Family Communication: Patient at bedside  Disposition Plan: to be determined, transfer to medical unit   Consultants:   Neurology   Procedures:   None  Antimicrobials:   None  Subjective: Pt reports feeling better but says right shoulder hurts.   Objective: Vitals:   12/25/16 0820 12/25/16 0900 12/25/16 1000 12/25/16 1100  BP: 111/82 113/82 107/81 116/88  Pulse: 79 79 87 84  Resp:   (!) 21 (!) 21  Temp: 98.1 F (36.7 C)     TempSrc: Oral     SpO2: 96% 96% 95% 96%  Weight:      Height:        Intake/Output Summary (Last 24 hours) at 12/25/16 1122 Last data filed at 12/25/16 [id number]  Gross per 24 hour  Intake             2670 ml  Output             1175 ml  Net             1495 ml   Filed Weights   12/24/16 1355 12/24/16 2011  Weight: 83.9 kg (185 lb) 76.7 kg (169 lb 1.5 oz)     Examination:  General exam: Appears calm and comfortable  Respiratory system: Clear to auscultation. Respiratory effort normal. Cardiovascular system: S1 & S2 heard, RRR. No JVD, murmurs, rubs, gallops or clicks. No pedal edema. Gastrointestinal system: Abdomen is nondistended, soft and nontender. No organomegaly or masses felt. Normal bowel sounds heard. Central nervous system: Alert and oriented. No focal neurological deficits. Extremities: Symmetric 5 x 5 power. Skin: No rashes, lesions or ulcers Psychiatry: Judgement and insight appear normal. Mood & affect appropriate.   Data Reviewed: I have personally reviewed following labs and imaging studies  CBC:  Recent Labs Lab 12/24/16 1352 12/25/16 0340  WBC 14.1* 8.5  NEUTROABS 7.5  --   HGB 17.9* 15.0  HCT 50.4 42.1  MCV 91.8 90.5  PLT 211 129*   Basic Metabolic Panel:  Recent Labs Lab 12/24/16 1352 12/24/16 1634 12/25/16 0340  NA 135  --  137  K 3.3*  --  3.4*  CL 99*  --  101  CO2 11*  --  27  GLUCOSE 388*  --  173*  BUN <5*  --  5*  CREATININE 0.94  --  0.72  CALCIUM 9.1  --  8.3*  MG  --  2.1  --    Liver Function Tests:  Recent Labs Lab 12/24/16 1352  AST 36  ALT 21  ALKPHOS 133*  BILITOT 0.7  PROT 7.4  ALBUMIN 4.5   Cardiac Enzymes:  Recent Labs Lab 12/24/16 1352  CKTOTAL 96  TROPONINI <0.03   CBG:  Recent Labs Lab 12/24/16 1754 12/25/16 0008 12/25/16 0824  GLUCAP 318* 201* 186*   Urine analysis:    Component Value Date/Time   COLORURINE STRAW (A) 12/24/2016 1412   APPEARANCEUR CLEAR 12/24/2016 1412   LABSPEC 1.009 12/24/2016 1412   PHURINE 5.0 12/24/2016 1412   GLUCOSEU >=500 (A) 12/24/2016 1412   HGBUR SMALL (A) 12/24/2016 1412   BILIRUBINUR NEGATIVE 12/24/2016 1412   KETONESUR NEGATIVE 12/24/2016 1412   PROTEINUR 100 (A) 12/24/2016 1412   UROBILINOGEN 0.2 12/18/2014 0004   NITRITE NEGATIVE 12/24/2016 1412   LEUKOCYTESUR NEGATIVE 12/24/2016 1412   Recent Results  (from the past 240 hour(s))  MRSA PCR Screening     Status: None   Collection Time: 12/24/16  8:12 PM  Result Value Ref Range Status   MRSA by PCR NEGATIVE NEGATIVE Final    Comment:        The GeneXpert MRSA Assay (FDA approved for NASAL specimens only), is one component of a comprehensive MRSA colonization surveillance program. It is not intended to diagnose MRSA infection nor to guide or monitor treatment for MRSA infections.     Radiology Studies: Ct Head Wo Contrast  Result Date: 12/24/2016 CLINICAL DATA:  Altered mental status and possible seizure EXAM: CT HEAD WITHOUT CONTRAST CT CERVICAL SPINE WITHOUT CONTRAST TECHNIQUE: Multidetector CT imaging of the head and cervical spine was performed following the standard protocol without intravenous contrast. Multiplanar CT image reconstructions of the cervical spine were also generated. COMPARISON:  [name] 07/30/2016 Brain [name] 07/30/2016 FINDINGS: Despite efforts by the technologist and patient, motion artifact is present on today's examination and could not be eliminated. The findings of this study are interpreted in the context of that limitation. CT HEAD FINDINGS Brain: No mass lesion, intraparenchymal hemorrhage or extra-axial collection. No evidence of acute cortical infarct. There is age advanced atrophy in an unchanged pattern compared to prior study. There is periventricular hypoattenuation compatible with chronic microvascular disease. Vascular: Atherosclerotic calcification of the internal carotid arteries at the skull base. No hyperdense vessel sign. Skull: Normal visualized skull base, calvarium and extracranial soft tissues. Sinuses/Orbits: Partial sphenoid sinus opacification. No mastoid or middle ear effusion. Normal orbits. CT CERVICAL SPINE FINDINGS Alignment: No static subluxation. Facets are aligned. Occipital condyles are normally positioned. Skull base and vertebrae: No acute fracture. Soft tissues and spinal canal: No  prevertebral fluid or swelling. No visible canal hematoma. Disc levels: No advanced spinal canal or neural foraminal stenosis. Upper chest: No pneumothorax, pulmonary nodule or pleural effusion. Other: Normal visualized paraspinal cervical soft tissues. IMPRESSION: 1. Motion degraded examination. 2. Within that limitation, no acute abnormality of the brain or cervical spine. 3. Age advanced atrophy and chronic microvascular ischemia. Electronically Signed   By: Deatra Robinson M.D.   On: 12/24/2016 15:40   Ct Cervical Spine Wo Contrast  Result Date: 12/24/2016 CLINICAL DATA:  Altered mental status and possible seizure EXAM: CT HEAD WITHOUT CONTRAST CT CERVICAL SPINE WITHOUT CONTRAST TECHNIQUE: Multidetector CT imaging of the head and cervical spine was performed following the standard protocol without intravenous contrast. Multiplanar  CT image reconstructions of the cervical spine were also generated. COMPARISON:  [name] 07/30/2016 Brain [name] 07/30/2016 FINDINGS: Despite efforts by the technologist and patient, motion artifact is present on today's examination and could not be eliminated. The findings of this study are interpreted in the context of that limitation. CT HEAD FINDINGS Brain: No mass lesion, intraparenchymal hemorrhage or extra-axial collection. No evidence of acute cortical infarct. There is age advanced atrophy in an unchanged pattern compared to prior study. There is periventricular hypoattenuation compatible with chronic microvascular disease. Vascular: Atherosclerotic calcification of the internal carotid arteries at the skull base. No hyperdense vessel sign. Skull: Normal visualized skull base, calvarium and extracranial soft tissues. Sinuses/Orbits: Partial sphenoid sinus opacification. No mastoid or middle ear effusion. Normal orbits. CT CERVICAL SPINE FINDINGS Alignment: No static subluxation. Facets are aligned. Occipital condyles are normally positioned. Skull base and vertebrae: No acute  fracture. Soft tissues and spinal canal: No prevertebral fluid or swelling. No visible canal hematoma. Disc levels: No advanced spinal canal or neural foraminal stenosis. Upper chest: No pneumothorax, pulmonary nodule or pleural effusion. Other: Normal visualized paraspinal cervical soft tissues. IMPRESSION: 1. Motion degraded examination. 2. Within that limitation, no acute abnormality of the brain or cervical spine. 3. Age advanced atrophy and chronic microvascular ischemia. Electronically Signed   By: Deatra Robinson M.D.   On: 12/24/2016 15:40   Dg Chest Portable 1 View  Result Date: 12/24/2016 CLINICAL DATA:  Altered mental status, post seizure EXAM: PORTABLE CHEST 1 VIEW COMPARISON:  CT chest dated 11/15/2016 FINDINGS: Lungs are clear.  No pleural effusion or pneumothorax. The heart is normal in size. IMPRESSION: No evidence of acute cardiopulmonary disease. Electronically Signed   By: Charline Bills M.D.   On: 12/24/2016 14:44   Scheduled Meds: . enoxaparin (LOVENOX) injection  40 mg Subcutaneous Q24H  . insulin aspart  0-5 Units Subcutaneous QHS  . insulin aspart  0-9 Units Subcutaneous TID WC  . insulin glargine  10 Units Subcutaneous QHS  . levETIRAcetam  1,500 mg Oral BID  . metoprolol tartrate  5 mg Intravenous Q6H   Continuous Infusions:   LOS: 0 days   Time spent: 35 minutes   Debbora Presto, MD [facility] Pager [phone]  If 7PM-7AM, please contact night-coverage [email or web address] Password [facility] 12/25/2016, 11:22 AM

## 2016-12-26 DIAGNOSIS — G9340 Encephalopathy, unspecified: Secondary | ICD-10-CM | POA: Diagnosis not present

## 2016-12-26 LAB — CBC
HEMATOCRIT: 41 % (ref 39.0–52.0)
HEMOGLOBIN: 14.1 g/dL (ref 13.0–17.0)
MCH: 31.9 pg (ref 26.0–34.0)
MCHC: 34.4 g/dL (ref 30.0–36.0)
MCV: 92.8 fL (ref 78.0–100.0)
Platelets: 125 10*3/uL — ABNORMAL LOW (ref 150–400)
RBC: 4.42 MIL/uL (ref 4.22–5.81)
RDW: 12.6 % (ref 11.5–15.5)
WBC: 6.7 10*3/uL (ref 4.0–10.5)

## 2016-12-26 LAB — BASIC METABOLIC PANEL
ANION GAP: 8 (ref 5–15)
BUN: 7 mg/dL (ref 6–20)
CHLORIDE: 104 mmol/L (ref 101–111)
CO2: 26 mmol/L (ref 22–32)
Calcium: 8.3 mg/dL — ABNORMAL LOW (ref 8.9–10.3)
Creatinine, Ser: 0.65 mg/dL (ref 0.61–1.24)
GFR calc non Af Amer: >60 mL/min (ref >60)
Glucose, Bld: 230 mg/dL — ABNORMAL HIGH (ref 65–99)
Potassium: 3.2 mmol/L — ABNORMAL LOW (ref 3.5–5.1)
Sodium: 138 mmol/L (ref 135–145)

## 2016-12-26 LAB — GLUCOSE, CAPILLARY: Glucose-Capillary: 207 mg/dL — ABNORMAL HIGH (ref 65–99)

## 2016-12-26 MED ORDER — HYDROCODONE-ACETAMINOPHEN 5-325 MG PO TABS: 1.0000 | ORAL_TABLET | Freq: Four times a day (QID) | ORAL | Status: DC | PRN

## 2016-12-26 MED ORDER — LEVETIRACETAM 750 MG PO TABS
1500.0000 mg | ORAL_TABLET | Freq: Two times a day (BID) | ORAL | Status: DC
Start: 2016-12-26 — End: 2017-01-31

## 2016-12-26 MED ORDER — HYDROCODONE-ACETAMINOPHEN 5-325 MG PO TABS: 1.0000 | ORAL_TABLET | Freq: Four times a day (QID) | ORAL | 0 refills | Status: DC | PRN

## 2016-12-26 MED ORDER — LEVETIRACETAM 1000 MG PO TABS: 1500.0000 mg | ORAL_TABLET | Freq: Two times a day (BID) | ORAL | 0 refills | Status: DC

## 2016-12-26 MED ORDER — POTASSIUM CHLORIDE CRYS ER 20 MEQ PO TBCR
40.0000 meq | EXTENDED_RELEASE_TABLET | Freq: Once | ORAL | Status: AC
  Administered 2016-12-26: 40 meq via ORAL
  Filled 2016-12-26: qty 2

## 2016-12-26 NOTE — Discharge Summary (Addendum)
Physician Discharge Summary  Miguel Diaz. ZOX:[id number] DOB: 05-May-1971 DOA: 12/24/2016  PCP: System, Provider Not In  Admit date: 12/24/2016 Discharge date: 12/26/2016  Recommendations for Outpatient Follow-up:  1. Pt will need to follow up with PCP in 1-2 weeks post discharge 2. Please obtain BMP to evaluate electrolytes and kidney function, potassium level   Discharge Diagnoses:  Principal Problem:   Encephalopathy acute Active Problems:   Tobacco abuse   Essential hypertension, benign   HLD (hyperlipidemia)   DM type 2 with diabetic peripheral neuropathy (HCC)   Seizure (HCC)   Tachycardia   Elevated lactic acid level  Discharge Condition: Stable  Diet recommendation: Heart healthy diet discussed in details   Brief Narrative:  46 yo male with diabetes, hx of CVA, seizure disorder, presented to [facility] with acute encephalopathy, concern for seizure.   Assessment & Plan:   Principal Problem:   Encephalopathy acute - in the setting of breakthrough seizure - pt reports feeling better, wants to go home  - neurologist recommended Keppra 1500 mg BID - no further recommendations per neurology team   Active Problems:   Essential hypertension, benign - resume home medical regimen     HLD (hyperlipidemia) - resume home regimen with statin     DM type 2 with diabetic peripheral neuropathy (HCC) - resume home medical regimen     Thrombocytopenia - likely reactive - outpatient follow up    Leukocytosis - reactive, resolved     Hypokalemia - supplemented, needs outpatient follow up  DVT prophylaxis: Lovenox SQ Code Status: Full  Family Communication: Patient at bedside  Disposition Plan: home   Consultants:   Neurology   Procedures:   None  Antimicrobials:   None  Procedures/Studies: Ct Head Wo Contrast  Result Date: 12/24/2016 CLINICAL DATA:  Altered mental status and possible seizure EXAM: CT HEAD WITHOUT CONTRAST CT CERVICAL  SPINE WITHOUT CONTRAST TECHNIQUE: Multidetector CT imaging of the head and cervical spine was performed following the standard protocol without intravenous contrast. Multiplanar CT image reconstructions of the cervical spine were also generated. COMPARISON:  [name] 07/30/2016 Brain [name] 07/30/2016 FINDINGS: Despite efforts by the technologist and patient, motion artifact is present on today's examination and could not be eliminated. The findings of this study are interpreted in the context of that limitation. CT HEAD FINDINGS Brain: No mass lesion, intraparenchymal hemorrhage or extra-axial collection. No evidence of acute cortical infarct. There is age advanced atrophy in an unchanged pattern compared to prior study. There is periventricular hypoattenuation compatible with chronic microvascular disease. Vascular: Atherosclerotic calcification of the internal carotid arteries at the skull base. No hyperdense vessel sign. Skull: Normal visualized skull base, calvarium and extracranial soft tissues. Sinuses/Orbits: Partial sphenoid sinus opacification. No mastoid or middle ear effusion. Normal orbits. CT CERVICAL SPINE FINDINGS Alignment: No static subluxation. Facets are aligned. Occipital condyles are normally positioned. Skull base and vertebrae: No acute fracture. Soft tissues and spinal canal: No prevertebral fluid or swelling. No visible canal hematoma. Disc levels: No advanced spinal canal or neural foraminal stenosis. Upper chest: No pneumothorax, pulmonary nodule or pleural effusion. Other: Normal visualized paraspinal cervical soft tissues. IMPRESSION: 1. Motion degraded examination. 2. Within that limitation, no acute abnormality of the brain or cervical spine. 3. Age advanced atrophy and chronic microvascular ischemia. Electronically Signed   By: Deatra Robinson M.D.   On: 12/24/2016 15:40   Ct Cervical Spine Wo Contrast  Result Date: 12/24/2016 CLINICAL DATA:  Altered mental status and possible seizure  EXAM: CT HEAD WITHOUT CONTRAST CT CERVICAL SPINE WITHOUT CONTRAST TECHNIQUE: Multidetector CT imaging of the head and cervical spine was performed following the standard protocol without intravenous contrast. Multiplanar CT image reconstructions of the cervical spine were also generated. COMPARISON:  [name] 07/30/2016 Brain [name] 07/30/2016 FINDINGS: Despite efforts by the technologist and patient, motion artifact is present on today's examination and could not be eliminated. The findings of this study are interpreted in the context of that limitation. CT HEAD FINDINGS Brain: No mass lesion, intraparenchymal hemorrhage or extra-axial collection. No evidence of acute cortical infarct. There is age advanced atrophy in an unchanged pattern compared to prior study. There is periventricular hypoattenuation compatible with chronic microvascular disease. Vascular: Atherosclerotic calcification of the internal carotid arteries at the skull base. No hyperdense vessel sign. Skull: Normal visualized skull base, calvarium and extracranial soft tissues. Sinuses/Orbits: Partial sphenoid sinus opacification. No mastoid or middle ear effusion. Normal orbits. CT CERVICAL SPINE FINDINGS Alignment: No static subluxation. Facets are aligned. Occipital condyles are normally positioned. Skull base and vertebrae: No acute fracture. Soft tissues and spinal canal: No prevertebral fluid or swelling. No visible canal hematoma. Disc levels: No advanced spinal canal or neural foraminal stenosis. Upper chest: No pneumothorax, pulmonary nodule or pleural effusion. Other: Normal visualized paraspinal cervical soft tissues. IMPRESSION: 1. Motion degraded examination. 2. Within that limitation, no acute abnormality of the brain or cervical spine. 3. Age advanced atrophy and chronic microvascular ischemia. Electronically Signed   By: Deatra RobinsonKevin  Herman M.D.   On: 12/24/2016 15:40   Dg Chest Portable 1 View  Result Date: 12/24/2016 CLINICAL DATA:   Altered mental status, post seizure EXAM: PORTABLE CHEST 1 VIEW COMPARISON:  CT chest dated 11/15/2016 FINDINGS: Lungs are clear.  No pleural effusion or pneumothorax. The heart is normal in size. IMPRESSION: No evidence of acute cardiopulmonary disease. Electronically Signed   By: Charline BillsSriyesh  Krishnan M.D.   On: 12/24/2016 14:44     Discharge Exam: Vitals:   12/25/16 2036 12/26/16 0625  BP: 112/74 98/74  Pulse: 83 72  Resp: 18 16  Temp: 97.8 F (36.6 C) 97.6 F (36.4 C)   Vitals:   12/25/16 1400 12/25/16 1500 12/25/16 2036 12/26/16 0625  BP: (!) 112/94 110/80 112/74 98/74  Pulse: 88 79 83 72  Resp: (!) 21 18 18 16   Temp:  99.1 F (37.3 C) 97.8 F (36.6 C) 97.6 F (36.4 C)  TempSrc:  Oral Oral Oral  SpO2: 95% 96% 97% 96%  Weight:      Height:        General: Pt is alert, follows commands appropriately, not in acute distress Cardiovascular: Regular rate and rhythm, S1/S2 +, no murmurs, no rubs, no gallops Respiratory: Clear to auscultation bilaterally, no wheezing, no crackles, no rhonchi Abdominal: Soft, non tender, non distended, bowel sounds +, no guarding Extremities: no edema, no cyanosis, pulses palpable bilaterally DP and PT Neuro: Grossly nonfocal  Discharge Instructions  Discharge Instructions    Diet - low sodium heart healthy    Complete by:  As directed    Increase activity slowly    Complete by:  As directed      Allergies as of 12/26/2016      Reactions   Penicillins Other (See Comments)   Unknown childhood reaction Has patient had a PCN reaction causing immediate rash, facial/tongue/throat swelling, SOB or lightheadedness with hypotension: Unknown Has patient had a PCN reaction causing severe rash involving mucus membranes or skin necrosis: Unknown Has patient had  a PCN reaction that required hospitalization: Unknown Has patient had a PCN reaction occurring within the last 10 years: Unknown If all of the above answers are "NO", then may proceed with  Cephalosporin use.   Toradol [ketorolac Tromethamine] Hives      Medication List    STOP taking these medications   pregabalin 150 MG capsule Commonly known as:  LYRICA     TAKE these medications   albuterol 108 (90 Base) MCG/ACT inhaler Commonly known as:  PROVENTIL HFA;VENTOLIN HFA Inhale 2 puffs into the lungs every 6 (six) hours as needed for wheezing or shortness of breath.   aspirin 325 MG tablet Take 1 tablet (325 mg total) by mouth daily.   atorvastatin 40 MG tablet Commonly known as:  LIPITOR Take 1 tablet (40 mg total) by mouth at bedtime.   FLUoxetine 40 MG capsule Commonly known as:  PROZAC Take 1 capsule (40 mg total) by mouth daily.   fluticasone 50 MCG/ACT nasal spray Commonly known as:  FLONASE Place 1 spray into both nostrils daily as needed for allergies or rhinitis.   glipiZIDE 10 MG tablet Commonly known as:  GLUCOTROL Take 10 mg by mouth daily with breakfast. What changed:  Another medication with the same name was removed. Continue taking this medication, and follow the directions you see here.   HYDROcodone-acetaminophen 5-325 MG tablet Commonly known as:  NORCO/VICODIN Take 1 tablet by mouth every 6 (six) hours as needed.   insulin glargine 100 UNIT/ML injection Commonly known as:  LANTUS Inject 0.1 mLs (10 Units total) into the skin at bedtime. What changed:  how much to take  when to take this  additional instructions   levETIRAcetam 750 MG tablet Commonly known as:  KEPPRA Take 2 tablets (1,500 mg total) by mouth 2 (two) times daily. What changed:  medication strength  how much to take   meclizine 25 MG tablet Commonly known as:  ANTIVERT Take 1 tablet (25 mg total) by mouth 3 (three) times daily as needed for dizziness.   metFORMIN 500 MG 24 hr tablet Commonly known as:  GLUCOPHAGE-XR Take 2 tablets (1,000 mg total) by mouth 2 (two) times daily.   metoprolol succinate 50 MG 24 hr tablet Commonly known as:   TOPROL-XL Take 1 tablet (50 mg total) by mouth daily. Take with or immediately following a meal.   multivitamin with minerals tablet Take 1 tablet by mouth daily.        Follow-up Information    Kipp Laurence, PA-C Follow up.   Specialty:  Physician Assistant Contact information: [address] [place] [address] [phone]            The results of significant diagnostics from this hospitalization (including imaging, microbiology, ancillary and laboratory) are listed below for reference.     Microbiology: Recent Results (from the past 240 hour(s))  MRSA PCR Screening     Status: None   Collection Time: 12/24/16  8:12 PM  Result Value Ref Range Status   MRSA by PCR NEGATIVE NEGATIVE Final    Comment:        The GeneXpert MRSA Assay (FDA approved for NASAL specimens only), is one component of a comprehensive MRSA colonization surveillance program. It is not intended to diagnose MRSA infection nor to guide or monitor treatment for MRSA infections.      Labs: Basic Metabolic Panel:  Recent Labs Lab 12/24/16 1352 12/24/16 1634 12/25/16 0340 12/26/16 0401  NA 135  --  137 138  K 3.3*  --  3.4* 3.2*  CL 99*  --  101 104  CO2 11*  --  27 26  GLUCOSE 388*  --  173* 230*  BUN <5*  --  5* 7  CREATININE 0.94  --  0.72 0.65  CALCIUM 9.1  --  8.3* 8.3*  MG  --  2.1  --   --    Liver Function Tests:  Recent Labs Lab 12/24/16 1352  AST 36  ALT 21  ALKPHOS 133*  BILITOT 0.7  PROT 7.4  ALBUMIN 4.5   CBC:  Recent Labs Lab 12/24/16 1352 12/25/16 0340 12/26/16 0401  WBC 14.1* 8.5 6.7  NEUTROABS 7.5  --   --   HGB 17.9* 15.0 14.1  HCT 50.4 42.1 41.0  MCV 91.8 90.5 92.8  PLT 211 129* 125*   Cardiac Enzymes:  Recent Labs Lab 12/24/16 1352  CKTOTAL 96  TROPONINI <0.03   CBG:  Recent Labs Lab 12/25/16 0824 12/25/16 1228 12/25/16 1644 12/25/16 2040 12/26/16 0622  GLUCAP 186* 186* 251* 402* 207*    SIGNED: Time coordinating discharge:  30 minutes  Debbora Presto, MD  [facility] 12/26/2016, 10:49 AM Pager [phone]  If 7PM-7AM, please contact night-coverage [email or web address] Password [email or web address]

## 2016-12-26 NOTE — Discharge Instructions (Signed)
    Epilepsy Epilepsy is when a person keeps having seizures. A seizure is unusual activity in the brain. A seizure can change how you think or behave, and it can make it hard to be aware of what is happening. This condition can cause problems, such as:  Falls, accidents, and injury.  Depression.  Poor memory.  Sudden unexplained death in epilepsy (SUDEP). This is rare. Its cause is not known. Most people with epilepsy lead normal lives. Follow these instructions at home: Medicines  Take medicines only as told by your doctor.  Avoid anything that may keep your medicine from working, such as alcohol. Activity  Get enough rest. Lack of sleep can make seizures more likely to occur.  Follow your doctor's advice about driving, swimming, and doing anything else that would be dangerous if you had a seizure. Teaching others  Teach friends and family what to do if you have a seizure. They should:  Lay you on the ground to prevent a fall.  Cushion your head and body.  Loosen any tight clothing around your neck.  Turn you on your side.  Stay with you until you are better.  Not hold you down.  Not put anything in your mouth.  Know whether or not you need emergency care. General instructions  Avoid anything that causes you to have seizures.  Keep a seizure diary. Write down what you remember about each seizure, and especially what might have caused it.  Keep all follow-up visits as told by your doctor. This is important. Contact a doctor if:  You have a change in your seizure pattern.  You get an infection or start to feel sick. You may have more seizures when you are sick. Get help right away if:  A seizure does not stop after 5 minutes.  You have more than one seizure in a row, and you do not have enough time between the seizures to feel better.  A seizure makes it harder to breathe.  A seizure is different from other seizures you have had.  A seizure makes you  unable to speak or use a part of your body.  You did not wake up right after a seizure. This information is not intended to replace advice given to you by your health care provider. Make sure you discuss any questions you have with your health care provider. Document Released: 04/18/2009 Document Revised: 01/26/2016 Document Reviewed: 12/30/2015 Elsevier Interactive Patient Education  2017 [employer]  

## 2016-12-27 LAB — LEVETIRACETAM LEVEL: Levetiracetam Lvl: NOT DETECTED ug/mL (ref 10.0–40.0)

## 2017-01-31 ENCOUNTER — Encounter: Payer: Self-pay | Admitting: Neurology

## 2017-01-31 ENCOUNTER — Ambulatory Visit (INDEPENDENT_AMBULATORY_CARE_PROVIDER_SITE_OTHER): Payer: Medicare Other | Admitting: Neurology

## 2017-01-31 VITALS — BP 108/75 | HR 75 | Ht 68.0 in | Wt 185.0 lb

## 2017-01-31 DIAGNOSIS — G40319 Generalized idiopathic epilepsy and epileptic syndromes, intractable, without status epilepticus: Secondary | ICD-10-CM

## 2017-01-31 DIAGNOSIS — R404 Transient alteration of awareness: Secondary | ICD-10-CM

## 2017-01-31 DIAGNOSIS — G40209 Localization-related (focal) (partial) symptomatic epilepsy and epileptic syndromes with complex partial seizures, not intractable, without status epilepticus: Secondary | ICD-10-CM

## 2017-01-31 DIAGNOSIS — H8110 Benign paroxysmal vertigo, unspecified ear: Secondary | ICD-10-CM

## 2017-01-31 MED ORDER — LEVETIRACETAM 750 MG PO TABS: 1500.0000 mg | ORAL_TABLET | Freq: Two times a day (BID) | ORAL | 6 refills | Status: DC

## 2017-01-31 NOTE — Progress Notes (Signed)
[facility]    Provider:  Dr Lucia GaskinsAhern Referring Provider: No ref. provider found Primary Care Physician:  System, Provider Not In  CC: Repeated Right-sided weakness with altered awareness  Interval history 01/31/2009: Patient returns here for episodes of breakthrough seizure, last seen in the hospital in June 2018 when he was on the phone with his girlfriend when he had a sudden cessation of speech and she heard him shaking on the other side sounding like he was having a seizure. 911 EMS found him combative. On arrival he was moving all extremities but not following commands.   EEG was normal. He had increased lactate and white blood cells likely secondary to seizure. Keppra was increased to 1500 mg twice a day. He denies missing any doses or illness. He has since been on an increased dose of Keppra. He is doing well on the keppra. New problem is vertigo. Room spins. Happens with movement. Diagnosed with BPPV.  Ct showed No acute intracranial abnormalities including mass lesion or mass effect, hydrocephalus, extra-axial fluid collection, midline shift, hemorrhage, or acute infarction, large ischemic events (personally reviewed images)   Interval History 05/14/2015:  He has been seeing Dr. Roda ShuttersXu since June. Has had several more episodes and hospital admissions for altered consciousness. Keppra was adjusted. He has been on Keppra 1000mg  twice a day since August. No events since then. No side effects from the Keppra.  Interval History 01/02/2015: 2 weeks ago he was going out to the car and wife saw him laying in the yard. They called 911. He was brought to the emergency room. He was confused for 24 hours in the emergency room. He thought his sister was his ex-wife. Didn't know his mother's name. Didn't know the date. The kept him a few nights. He was changed from Depakote to Keppra. He went to the restroom and she heard a knock and she saw him against the wall. He turned face first into  the restroom door. He couldn't respond or help. He was seen at the pcp office and his body was actively shaking. He was sent to the ED and was observed. He was confused afterwards for 4 hours. He wanted to sleep. His pcp increased his Keppra to 750 bid. Another EEG was performed on the 15th during his post-ictal period which was normal. Recommend long-term EEG to Dr. Roda ShuttersXu.   11/24/2013: Leonard DowningGeorge L Roosvelt MaserWiseman Jr. is a 46 y.o. male here as a referral from Dr. Cleda Clarksheren for acute onset right-sided weakness. He follows with Dr. Roda ShuttersXu in the office however he is seeing me today as Dr. Roda ShuttersXu is not here and patient had another episode of right-sided weakness with altered awareness. PMH of DM, DM neuropathy, HLD, blindness was admitted or or had ER visits 7 times since 01/2014 for episodic LOC, unresponsive to verbal commands, confusion, nonverbal, right sided weakness/heavy feeling, headache, chest pain. Etiology has been unclear. 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 The possibility of complicated migraines has also been question and patient was placed on Topamax. However most recently patient had another episode and this time she was laying on the couch.   In April, patient was laying on the couch and he was not responding. He was just staring off. His girl friend is with him and provides most information. Eyes wide open. Looking straight in the direction of the TV, no foaming at the mouth, no  abnormal movements. The paramedics came, staring spell lasted until the paramedics came, he would occ grunt. He closed his eyes in 5 minutes but no response except a grunt when someone would say his name. He told the paramedics that his right side was numb, no pain on the right side to noxious stimuli by the paramedics. They took him to the ED and by the time he got to the ED, there was slurred speech and  facial droop. There was no headache associated with this episode. He is on topamax 50mg  bid for complicated migraines. He was confused afterwards but he ok when got to the hospital. Also c/o memory loss. In [place] there was reportedly an episode of GTCS after sitting on the couch with confusion afterwards.  Reviewed notes, labs and imaging from outside physicians, which showed:   EEG normal  [name] of the brain: Personally reviewed images:  Mild ventriculomegaly likely due to global atrophy  No mass lesions or mass effect, no acute ischemia or acute events.  Nonspecific chronic white matter changes likely small vessel ischemic disease which is advanced for age   Review of Systems:  Patient complains of symptoms per HPI as well as the following symptoms: Dizziness, headache, weakness, joint pain, walking difficulty, daytime sleepiness, snoring. Pertinent negatives per HPI. All others negative.    Social History   Social History  . Marital status: Significant Other    Spouse name: N/A  . Number of children: 2  . Years of education: 9th   Occupational History  . disabled    Social History Main Topics  . Smoking status: Current Every Day Smoker    Packs/day: 0.00    Types: Cigarettes, E-cigarettes  . Smokeless tobacco: Never Used     Comment: he does electonic cigarettes sometmes too  . Alcohol use 0.0 oz/week  . Drug use: No  . Sexual activity: Not Currently   Other Topics Concern  . Not on file   Social History Narrative   ** Merged History Encounter **   Patient  Lives with his girl friend   Patient is right handed   Patient coffee daily        Family History  Problem Relation Age of Onset  . High blood pressure Mother   . Diabetes Mellitus I Mother   . Congenital heart disease Mother   . High blood pressure Father   . Diabetes Mellitus I Father   . CAD Father 31  . Migraines Daughter   . Parkinsonism Daughter   . Parkinsonism Son   . High blood pressure Sister    . Congenital heart disease Sister   . High blood pressure Paternal Grandfather     Past Medical History:  Diagnosis Date  . Anxiety   . Asthma   . Blindness    bilateral  . Depression   . Diabetes mellitus without complication (HCC)   . Hypercholesteremia   . Neuropathy   . PSVT (paroxysmal supraventricular tachycardia) (HCC) 06/2015  . Seizure (HCC)   . Stroke (HCC) 02/2014   right sided weakness/numbness  . Vision loss of left eye     Past Surgical History:  Procedure Laterality Date  . ABDOMINAL SURGERY    . EYE SURGERY Left   . HERNIA REPAIR      Current Outpatient Prescriptions  Medication Sig Dispense Refill  . albuterol (PROVENTIL HFA;VENTOLIN HFA) 108 (90 Base) MCG/ACT inhaler Inhale 2 puffs into the lungs every 6 (six) hours as needed for wheezing or  shortness of breath. 2 Inhaler 0  . aspirin 325 MG tablet Take 1 tablet (325 mg total) by mouth daily. 30 tablet 0  . atorvastatin (LIPITOR) 40 MG tablet Take 1 tablet (40 mg total) by mouth at bedtime. 30 tablet 0  . FLUoxetine (PROZAC) 40 MG capsule Take 1 capsule (40 mg total) by mouth daily. 30 capsule 0  . fluticasone (FLONASE) 50 MCG/ACT nasal spray Place 1 spray into both nostrils daily as needed for allergies or rhinitis. 15.8 g 0  . glipiZIDE (GLUCOTROL) 10 MG tablet Take 10 mg by mouth daily with breakfast.  0  . HYDROcodone-acetaminophen (NORCO/VICODIN) 5-325 MG tablet Take 1 tablet by mouth every 6 (six) hours as needed. 20 tablet 0  . insulin glargine (LANTUS) 100 UNIT/ML injection Inject 0.1 mLs (10 Units total) into the skin at bedtime. (Patient taking differently: Inject 30 Units into the skin See admin instructions. Inject 20 units subcutaneously every other night) 10 mL 0  . levETIRAcetam (KEPPRA) 750 MG tablet Take 2 tablets (1,500 mg total) by mouth 2 (two) times daily. 360 tablet 6  . meclizine (ANTIVERT) 25 MG tablet Take 1 tablet (25 mg total) by mouth 3 (three) times daily as needed for dizziness.  30 tablet 0  . metFORMIN (GLUCOPHAGE-XR) 500 MG 24 hr tablet Take 2 tablets (1,000 mg total) by mouth 2 (two) times daily. 120 tablet 0  . metoprolol succinate (TOPROL-XL) 50 MG 24 hr tablet Take 1 tablet (50 mg total) by mouth daily. Take with or immediately following a meal. 30 tablet 0  . Multiple Vitamins-Minerals (MULTIVITAMIN WITH MINERALS) tablet Take 1 tablet by mouth daily.     No current facility-administered medications for this visit.     Allergies as of 01/31/2017 - Review Complete 01/31/2017  Allergen Reaction Noted  . Penicillins Other (See Comments) 01/25/2014  . Toradol [ketorolac tromethamine] Hives 01/25/2014    Vitals: BP 108/75   Pulse 75   Ht 5\' 8"  (1.727 m)   Wt 185 lb (83.9 kg)   BMI 28.13 kg/m  Last Weight:  Wt Readings from Last 1 Encounters:  01/31/17 185 lb (83.9 kg)   Last Height:   Ht Readings from Last 1 Encounters:  01/31/17 5\' 8"  (1.727 m)   Physical exam: Exam: Gen: NAD,  Eyes: Conjunctivae clear without exudates or hemorrhage  Neuro: Detailed Neurologic Exam  Speech:    Speech is normal; fluent and spontaneous with normal comprehension.  Cognition:    The patient is oriented to person, place, and time;   Cranial Nerves:    The pupils are equal, round, and reactive to light. Does not blink to threat. Marland Kitchen. Extraocular movements are intact. Trigeminal sensation is intact and the muscles of mastication are normal. Right ptosis. The palate elevates in the midline. Hearing intact. Voice is normal. Shoulder shrug is normal. The tongue has normal motion without fasciculations.   Coordination:    Normal finger to nose and heel to shin. Normal rapid alternating movements.   Motor Observation:    No asymmetry, no atrophy, and no involuntary movements noted. Tone:    Normal muscle tone.    Posture:    Posture is normal. normal erect     Assessment/Plan:  46 year old with epilepsy here with a breakthrough seizure.  PMH of DM, DM neuropathy,  HLD, blindness was admitted for had multiple ER visitsand recent admission for episodic LOC, unresponsive to verbal commands, confusion, nonverbal, right sided weakness/heavy feeling, headache, chest pain. Etiology has  been unclear.  Continue ASA and Lipitor for stroke prevention  Needs to follow closely with primary care for management of vascular risk factors.  Continue Keppra 1500 bid for seizure  He does not drive  Seizure precautions, discussed  BPPV: having episodes of vertigo on movement, diagnosed with BPPV. Takes meclizine. Discussed diagnosis, vestibular rehab for Epley Maneuvers and other treatment  - Discussed fall precautions   Naomie Dean, MD  [facility] [address] [place], [place] [address]  Phone [phone] Fax [phone]  A total of 25  minutes was spent face-to-face with this patient. Over half this time was spent on counseling patient on the epilepsy, BPPV diagnosis and different diagnostic and therapeutic options available.

## 2017-01-31 NOTE — Patient Instructions (Addendum)
Remember to drink plenty of fluid, eat healthy meals and do not skip any meals. Try to eat protein with a every meal and eat a healthy snack such as fruit or nuts in between meals. Try to keep a regular sleep-wake schedule and try to exercise daily, particularly in the form of walking, 20-30 minutes a day, if you can.   As far as your medications are concerned, I would like to suggest: Continue Keppra 1500mg  twice daily  Vertigo: Vestibular therapy  I would like to see you back in 6 months, sooner if we need to. Please call us with any interim questions, concerns, problems, updates or refill requests.   Our phone number is [phone]. We also have an after hours call service for urgent matters and there is a physician on-call for urgent questions. For any emergencies you know to call 911 or go to the nearest emergency room   Benign Positional Vertigo Vertigo is the feeling that you or your surroundings are moving when they are not. Benign positional vertigo is the most common form of vertigo. The cause of this condition is not serious (is benign). This condition is triggered by certain movements and positions (is positional). This condition can be dangerous if it occurs while you are doing something that could endanger you or others, such as driving. What are the causes? In many cases, the cause of this condition is not known. It may be caused by a disturbance in an area of the inner ear that helps your brain to sense movement and balance. This disturbance can be caused by a viral infection (labyrinthitis), head injury, or repetitive motion. What increases the risk? This condition is more likely to develop in:  Women.  People who are 44 years of age or older.  What are the signs or symptoms? Symptoms of this condition usually happen when you move your head or your eyes in different directions. Symptoms may start suddenly, and they usually last for less than a minute. Symptoms may  include:  Loss of balance and falling.  Feeling like you are spinning or moving.  Feeling like your surroundings are spinning or moving.  Nausea and vomiting.  Blurred vision.  Dizziness.  Involuntary eye movement (nystagmus).  Symptoms can be mild and cause only slight annoyance, or they can be severe and interfere with daily life. Episodes of benign positional vertigo may return (recur) over time, and they may be triggered by certain movements. Symptoms may improve over time. How is this diagnosed? This condition is usually diagnosed by medical history and a physical exam of the head, neck, and ears. You may be referred to a health care provider who specializes in ear, nose, and throat (ENT) problems (otolaryngologist) or a provider who specializes in disorders of the nervous system (neurologist). You may have additional testing, including:  [name].  A CT scan.  Eye movement tests. Your health care provider may ask you to change positions quickly while he or she watches you for symptoms of benign positional vertigo, such as nystagmus. Eye movement may be tested with an electronystagmogram (ENG), caloric stimulation, the Dix-Hallpike test, or the roll test.  An electroencephalogram (EEG). This records electrical activity in your brain.  Hearing tests.  How is this treated? Usually, your health care provider will treat this by moving your head in specific positions to adjust your inner ear back to normal. Surgery may be needed in severe cases, but this is rare. In some cases, benign positional vertigo may resolve  on its own in 2-4 weeks. Follow these instructions at home: Safety  Move slowly.Avoid sudden body or head movements.  Avoid driving.  Avoid operating heavy machinery.  Avoid doing any tasks that would be dangerous to you or others if a vertigo episode would occur.  If you have trouble walking or keeping your balance, try using a cane for stability. If you feel dizzy  or unstable, sit down right away.  Return to your normal activities as told by your health care provider. Ask your health care provider what activities are safe for you. General instructions  Take over-the-counter and prescription medicines only as told by your health care provider.  Avoid certain positions or movements as told by your health care provider.  Drink enough fluid to keep your urine clear or pale yellow.  Keep all follow-up visits as told by your health care provider. This is important. Contact a health care provider if:  You have a fever.  Your condition gets worse or you develop new symptoms.  Your family or friends notice any behavioral changes.  Your nausea or vomiting gets worse.  You have numbness or a "pins and needles" sensation. Get help right away if:  You have difficulty speaking or moving.  You are always dizzy.  You faint.  You develop severe headaches.  You have weakness in your legs or arms.  You have changes in your hearing or vision.  You develop a stiff neck.  You develop sensitivity to light. This information is not intended to replace advice given to you by your health care provider. Make sure you discuss any questions you have with your health care provider. Document Released: 03/29/2006 Document Revised: 11/27/2015 Document Reviewed: 10/14/2014 Elsevier Interactive Patient Education  [employer]2018 [employer]

## 2017-02-03 ENCOUNTER — Ambulatory Visit: Payer: Medicare Other | Admitting: Cardiovascular Disease

## 2017-02-18 ENCOUNTER — Emergency Department (HOSPITAL_COMMUNITY)
Admission: EM | Admit: 2017-02-18 | Discharge: 2017-02-19 | Disposition: A | Discharge status: Home / Self Care | Payer: Medicare Other | Attending: Emergency Medicine | Admitting: Emergency Medicine

## 2017-02-18 ENCOUNTER — Encounter (HOSPITAL_COMMUNITY): Payer: Self-pay

## 2017-02-18 DIAGNOSIS — Z79899 Other long term (current) drug therapy: Secondary | ICD-10-CM | POA: Diagnosis not present

## 2017-02-18 DIAGNOSIS — S40011A Contusion of right shoulder, initial encounter: Secondary | ICD-10-CM | POA: Diagnosis not present

## 2017-02-18 DIAGNOSIS — Z7982 Long term (current) use of aspirin: Secondary | ICD-10-CM | POA: Diagnosis not present

## 2017-02-18 DIAGNOSIS — J45909 Unspecified asthma, uncomplicated: Secondary | ICD-10-CM | POA: Diagnosis not present

## 2017-02-18 DIAGNOSIS — Y92091 Bathroom in other non-institutional residence as the place of occurrence of the external cause: Secondary | ICD-10-CM | POA: Diagnosis not present

## 2017-02-18 DIAGNOSIS — E119 Type 2 diabetes mellitus without complications: Secondary | ICD-10-CM | POA: Insufficient documentation

## 2017-02-18 DIAGNOSIS — W19XXXA Unspecified fall, initial encounter: Secondary | ICD-10-CM

## 2017-02-18 DIAGNOSIS — Y9301 Activity, walking, marching and hiking: Secondary | ICD-10-CM | POA: Insufficient documentation

## 2017-02-18 DIAGNOSIS — F17210 Nicotine dependence, cigarettes, uncomplicated: Secondary | ICD-10-CM | POA: Insufficient documentation

## 2017-02-18 DIAGNOSIS — Y998 Other external cause status: Secondary | ICD-10-CM | POA: Insufficient documentation

## 2017-02-18 DIAGNOSIS — Z7984 Long term (current) use of oral hypoglycemic drugs: Secondary | ICD-10-CM | POA: Insufficient documentation

## 2017-02-18 DIAGNOSIS — I10 Essential (primary) hypertension: Secondary | ICD-10-CM | POA: Insufficient documentation

## 2017-02-18 DIAGNOSIS — S4991XA Unspecified injury of right shoulder and upper arm, initial encounter: Secondary | ICD-10-CM | POA: Diagnosis present

## 2017-02-18 DIAGNOSIS — W010XXA Fall on same level from slipping, tripping and stumbling without subsequent striking against object, initial encounter: Secondary | ICD-10-CM | POA: Insufficient documentation

## 2017-02-18 NOTE — ED Triage Notes (Signed)
Pt. Has limited movement in right extremity due to pain.

## 2017-02-18 NOTE — ED Notes (Signed)
Patient is A&Ox4.  No signs of distress noted.  Please see providers complete history and physical exam.  

## 2017-02-18 NOTE — ED Triage Notes (Signed)
Pt. States he fell this evening in the bathroom. Pt. States he tripped over a leg holding the sink and hit his right arm on the side of the door jam. Pt. Has restricted movement in right arm. Pt. States not having a history of falls.

## 2017-02-19 ENCOUNTER — Emergency Department (HOSPITAL_COMMUNITY): Payer: Medicare Other

## 2017-02-19 DIAGNOSIS — S40011A Contusion of right shoulder, initial encounter: Secondary | ICD-10-CM | POA: Diagnosis not present

## 2017-02-19 NOTE — ED Notes (Signed)
PT states understanding of care given, follow up care, and medication prescribed. PT ambulated from ED to car with a steady gait. 

## 2017-02-19 NOTE — Discharge Instructions (Signed)
Ice to area of pain.  See your Physician for recheck in 3-4 days if pain persist

## 2017-02-19 NOTE — ED Provider Notes (Signed)
[facility]ENCY DEPT Provider Note   CSN: [id number] Arrival date & time: 02/18/17  2150     History   Chief Complaint Chief Complaint  Patient presents with  . Arm Injury    HPI Miguel Diaz. is a 46 y.o. male.  The history is provided by the patient. No language interpreter was used.  Arm Injury   This is a new problem. The problem occurs constantly. The problem has been gradually worsening. The pain is present in the right shoulder. The quality of the pain is described as aching. The pain is moderate. Associated symptoms include limited range of motion. He has tried nothing for the symptoms. The treatment provided no relief. There has been a history of trauma.  Pt fell an hit right shoulder.    Past Medical History:  Diagnosis Date  . Anxiety   . Asthma   . Blindness    bilateral  . Depression   . Diabetes mellitus without complication (HCC)   . Hypercholesteremia   . Neuropathy   . PSVT (paroxysmal supraventricular tachycardia) (HCC) 06/2015  . Seizure (HCC)   . Stroke (HCC) 02/2014   right sided weakness/numbness  . Vision loss of left eye     Patient Active Problem List   Diagnosis Date Noted  . Partial epilepsy with impairment of consciousness, not intractable (HCC) 01/31/2017  . Tachycardia 12/24/2016  . Elevated lactic acid level 12/24/2016  . Seizure (HCC)   . Hypokalemia 11/15/2016  . Pain in the chest   . Type 2 diabetes mellitus without complication, with long-term current use of insulin (HCC)   . Hyperlipidemia   . Tobacco use disorder   . Chest pain 12/14/2015  . Episode of altered consciousness 01/15/2015  . Encephalopathy acute 12/18/2014  . Altered mental status   . LOC (loss of consciousness) (HCC)   . 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 Encephalopathy 08/01/2014  . Migraine 07/31/2014  . 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 Orthostatic hypotension 06/03/2014   . 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 Cerebral thrombosis with cerebral infarction (HCC) 05/20/2014  . Right sided weakness 05/19/2014  . Stroke (HCC)   . DM type 2 with diabetic peripheral neuropathy (HCC) 04/09/2014  . 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 Essential hypertension, benign 03/27/2014  . HLD (hyperlipidemia) 03/27/2014  . Type II or unspecified type diabetes mellitus with peripheral circulatory disorders, uncontrolled(250.72) 02/12/2014  . Tobacco abuse 02/12/2014  . CVA (cerebral infarction) 02/11/2014  . Syncope 01/25/2014  . Hip pain 01/25/2014  . SAH (subarachnoid hemorrhage) (HCC) 01/25/2014  . Vision, loss, sudden 01/25/2014  . Type II or unspecified type diabetes mellitus without mention of complication, uncontrolled 01/25/2014    Past Surgical History:  Procedure Laterality Date  . ABDOMINAL SURGERY    . EYE SURGERY Left   . HERNIA REPAIR         Home Medications    Prior to Admission medications   Medication Sig Start Date End Date Taking? Authorizing Provider  albuterol (PROVENTIL HFA;VENTOLIN HFA) 108 (90 Base) MCG/ACT inhaler Inhale 2 puffs into the lungs every 6 (six) hours as needed for wheezing or shortness of breath. 11/16/16   Joseph Art, DO  aspirin 325 MG tablet Take 1 tablet (325 mg total) by mouth daily. 05/21/14   Joseph Art, DO  atorvastatin (LIPITOR) 40 MG tablet Take 1 tablet (40 mg total) by mouth  at bedtime. 11/16/16   Joseph Art, DO  FLUoxetine (PROZAC) 40 MG capsule Take 1 capsule (40 mg total) by mouth daily. 11/16/16   Joseph Art, DO  fluticasone (FLONASE) 50 MCG/ACT nasal spray Place 1 spray into both nostrils daily as needed for allergies or rhinitis. 11/16/16   Joseph Art, DO  glipiZIDE (GLUCOTROL) 10 MG tablet Take 10 mg by mouth daily with breakfast. 11/18/16   [provider]  HYDROcodone-acetaminophen (NORCO/VICODIN) 5-325 MG tablet Take 1 tablet by  mouth every 6 (six) hours as needed. 12/26/16   Dorothea Ogle, MD  insulin glargine (LANTUS) 100 UNIT/ML injection Inject 0.1 mLs (10 Units total) into the skin at bedtime. Patient taking differently: Inject 30 Units into the skin See admin instructions. Inject 20 units subcutaneously every other night 11/16/16   Marlin Canary U, DO  levETIRAcetam (KEPPRA) 750 MG tablet Take 2 tablets (1,500 mg total) by mouth 2 (two) times daily. 01/31/17   Anson Fret, MD  meclizine (ANTIVERT) 25 MG tablet Take 1 tablet (25 mg total) by mouth 3 (three) times daily as needed for dizziness. 11/16/16   Joseph Art, DO  metFORMIN (GLUCOPHAGE-XR) 500 MG 24 hr tablet Take 2 tablets (1,000 mg total) by mouth 2 (two) times daily. 11/16/16   Joseph Art, DO  metoprolol succinate (TOPROL-XL) 50 MG 24 hr tablet Take 1 tablet (50 mg total) by mouth daily. Take with or immediately following a meal. 11/17/16   Joseph Art, DO  Multiple Vitamins-Minerals (MULTIVITAMIN WITH MINERALS) tablet Take 1 tablet by mouth daily.    [provider]    Family History Family History  Problem Relation Age of Onset  . High blood pressure Mother   . Diabetes Mellitus I Mother   . Congenital heart disease Mother   . High blood pressure Father   . Diabetes Mellitus I Father   . CAD Father 42  . Migraines Daughter   . Parkinsonism Daughter   . Parkinsonism Son   . High blood pressure Sister   . Congenital heart disease Sister   . High blood pressure Paternal Grandfather     Social History Social History  Substance Use Topics  . Smoking status: Current Every Day Smoker    Packs/day: 0.00    Types: Cigarettes, E-cigarettes  . Smokeless tobacco: Never Used     Comment: he does electonic cigarettes sometmes too  . Alcohol use 0.0 oz/week     Allergies   Penicillins and Toradol [ketorolac tromethamine]   Review of Systems Review of Systems  All other systems reviewed and are negative.    Physical  Exam Updated Vital Signs BP 124/89 (BP Location: Left Arm)   Pulse 88   Temp 97.7 F (36.5 C) (Oral)   Resp 18   Ht 5\' 8"  (1.727 m)   Wt 81.6 kg (180 lb)   SpO2 99%   BMI 27.37 kg/m   Physical Exam  Constitutional: He appears well-developed and well-nourished.  HENT:  Head: Normocephalic and atraumatic.  Eyes: Conjunctivae are normal.  Neck: Neck supple.  Cardiovascular: Normal rate and regular rhythm.   No murmur heard. Pulmonary/Chest: Effort normal and breath sounds normal. No respiratory distress.  Abdominal: Soft. There is no tenderness.  Musculoskeletal: He exhibits no edema.  Tender right scapula,  Tender right humerus,  From arm.  apin with movement.  nv and ns intact  Neurological: He is alert.  Skin: Skin is warm and dry.  Psychiatric:  He has a normal mood and affect.  Nursing note and vitals reviewed.    ED Treatments / Results  Labs (all labs ordered are listed, but only abnormal results are displayed) Labs Reviewed - No data to display  EKG  EKG Interpretation None       Radiology No results found.  Procedures Procedures (including critical care time)  Medications Ordered in ED Medications - No data to display   Initial Impression / Assessment and Plan / ED Course  I have reviewed the triage vital signs and the nursing notes.  Pertinent labs & imaging results that were available during my care of the patient were reviewed by me and considered in my medical decision making (see chart for details).       Final Clinical Impressions(s) / ED Diagnoses   Final diagnoses:  Fall, initial encounter  Contusion of right shoulder, initial encounter    New Prescriptions New Prescriptions   No medications on file  An After Visit Summary was printed and given to the patient.   Elson Areas, PA-C 02/19/17 Lenoria Chime    Geoffery Lyons, MD 02/19/17 [phone]

## 2017-02-24 ENCOUNTER — Ambulatory Visit: Payer: Medicare Other | Admitting: Cardiovascular Disease

## 2017-03-28 ENCOUNTER — Ambulatory Visit (INDEPENDENT_AMBULATORY_CARE_PROVIDER_SITE_OTHER): Payer: Medicare Other | Admitting: Cardiovascular Disease

## 2017-03-28 ENCOUNTER — Encounter: Payer: Self-pay | Admitting: Cardiovascular Disease

## 2017-03-28 VITALS — BP 100/74 | HR 76 | Ht 68.0 in | Wt 189.0 lb

## 2017-03-28 DIAGNOSIS — I2510 Atherosclerotic heart disease of native coronary artery without angina pectoris: Secondary | ICD-10-CM

## 2017-03-28 DIAGNOSIS — F17200 Nicotine dependence, unspecified, uncomplicated: Secondary | ICD-10-CM | POA: Diagnosis not present

## 2017-03-28 DIAGNOSIS — E1169 Type 2 diabetes mellitus with other specified complication: Secondary | ICD-10-CM | POA: Diagnosis not present

## 2017-03-28 DIAGNOSIS — E1142 Type 2 diabetes mellitus with diabetic polyneuropathy: Secondary | ICD-10-CM | POA: Diagnosis not present

## 2017-03-28 DIAGNOSIS — E782 Mixed hyperlipidemia: Secondary | ICD-10-CM | POA: Diagnosis not present

## 2017-03-28 NOTE — Patient Instructions (Signed)
Dr Croitoru recommends that you schedule a follow-up appointment in 6 months. You will receive a reminder letter in the mail two months in advance. If you don't receive a letter, please call our office to schedule the follow-up appointment.  If you need a refill on your cardiac medications before your next appointment, please call your pharmacy.  Your physician discussed the hazards of tobacco use. Tobacco use cessation is recommended and techniques and options to help you quit were discussed. 

## 2017-03-28 NOTE — Progress Notes (Signed)
Cardiology Office Note:    Date:  03/28/2017   ID:  Miguel Coe., DOB Aug 13, 1970, MRN [id number]  PCP:  [facility], [place]  Cardiologist:  Thurmon Fair, MD \   Referring MD: [facility]   Chief Complaint  Patient presents with  . Follow-up  CAD on CTA  History of Present Illness:    Miguel Byrnes. is a 46 y.o. male with a hx of Extensive coronary artery disease, without hemodynamically significant stenoses by coronary CT angiography performed in May 2018. She presented at that time with atypical chest discomfort and coronary CTA showed a calcium score at 94th percentile for age/gender, 50-69% stenosis in the mid LAD and scattered lesions throughout all 3 major coronary arteries.  His activity level is low, in large part due to blindness. He is completely blind in the right eye and has tunnel vision in the left eye. He denies exertional angina or dyspnea.  Unfortunately, Miguel Diaz continues to smoke about a pack of cigarettes a day. He has tried to quit several times in the past. At one point she did manage to stay quit for about 2 months. He is reluctant to use any pharmacological aids due to history of seizures.  He is compliant with atorvastatin 40 mg daily and reports having labs performed in [facility] (results not available). Glycemic control remains poor. He reports that his last hemoglobin A1c was 11.3%.  He had another seizure so his dose of Keppra was increased. He does not have chest pain, dizziness, syncope, palpitations, focal neurological complaints (other than lower extremity bilateral neuropathy), leg edema, claudication.  Past Medical History:  Diagnosis Date  . Anxiety   . Asthma   . Blindness    bilateral  . Depression   . Diabetes mellitus without complication (HCC)   . Hypercholesteremia   . Neuropathy   . PSVT (paroxysmal supraventricular tachycardia) (HCC) 06/2015  . Seizure (HCC)   . Stroke (HCC) 02/2014   right sided weakness/numbness   . Vision loss of left eye     Past Surgical History:  Procedure Laterality Date  . ABDOMINAL SURGERY    . EYE SURGERY Left   . HERNIA REPAIR      Current Medications: Current Meds  Medication Sig  . albuterol (PROVENTIL HFA;VENTOLIN HFA) 108 (90 Base) MCG/ACT inhaler Inhale 2 puffs into the lungs every 6 (six) hours as needed for wheezing or shortness of breath.  Marland Kitchen aspirin 325 MG tablet Take 1 tablet (325 mg total) by mouth daily.  Marland Kitchen atorvastatin (LIPITOR) 40 MG tablet Take 1 tablet (40 mg total) by mouth at bedtime.  Marland Kitchen FLUoxetine (PROZAC) 40 MG capsule Take 1 capsule (40 mg total) by mouth daily.  . fluticasone (FLONASE) 50 MCG/ACT nasal spray Place 1 spray into both nostrils daily as needed for allergies or rhinitis.  Marland Kitchen glipiZIDE (GLUCOTROL) 10 MG tablet Take 10 mg by mouth daily with breakfast.  . HYDROcodone-acetaminophen (NORCO/VICODIN) 5-325 MG tablet Take 1 tablet by mouth every 6 (six) hours as needed.  . insulin glargine (LANTUS) 100 UNIT/ML injection Inject 30 Units into the skin at bedtime.  . levETIRAcetam (KEPPRA) 750 MG tablet Take 2 tablets (1,500 mg total) by mouth 2 (two) times daily.  . meclizine (ANTIVERT) 25 MG tablet Take 1 tablet (25 mg total) by mouth 3 (three) times daily as needed for dizziness.  . metFORMIN (GLUCOPHAGE-XR) 500 MG 24 hr tablet Take 2 tablets (1,000 mg total) by mouth 2 (two) times daily.  Marland Kitchen  metoprolol succinate (TOPROL-XL) 50 MG 24 hr tablet Take 1 tablet (50 mg total) by mouth daily. Take with or immediately following a meal.  . Multiple Vitamins-Minerals (MULTIVITAMIN WITH MINERALS) tablet Take 1 tablet by mouth daily.     Allergies:   Penicillins and Toradol [ketorolac tromethamine]   Social History   Social History  . Marital status: Significant Other    Spouse name: N/A  . Number of children: 2  . Years of education: 9th   Occupational History  . disabled    Social History Main Topics  . Smoking status: Current Every Day  Smoker    Packs/day: 0.00    Types: Cigarettes, E-cigarettes  . Smokeless tobacco: Never Used     Comment: he does electonic cigarettes sometmes too  . Alcohol use 0.0 oz/week  . Drug use: No  . Sexual activity: Not Currently   Other Topics Concern  . None   Social History Narrative   ** Merged History Encounter **   Patient  Lives with his girl friend   Patient is right handed   Patient coffee daily         Family History: The patient's family history includes CAD (age of onset: 30) in his father; Congenital heart disease in his mother and sister; Diabetes Mellitus I in his father and mother; High blood pressure in his father, mother, paternal grandfather, and sister; Migraines in his daughter; Parkinsonism in his daughter and son. ROS:   Please see the history of present illness.     All other systems reviewed and are negative.  EKGs/Labs/Other Studies Reviewed:    The following studies were reviewed today: Notes from emergency room visit in June  EKG:  EKG is ordered today.  The ekg ordered today demonstrates sinus rhythm, T-wave inversion in leads 3, aVF, V3-V6, similar to tracing from May 2018  Recent Labs: 12/24/2016: ALT 21; Magnesium 2.1 12/26/2016: BUN 7; Creatinine, Ser 0.65; Hemoglobin 14.1; Platelets 125; Potassium 3.2; Sodium 138  Recent Lipid Panel    Component Value Date/Time   CHOL 303 (H) 11/15/2016 0639   TRIG 385 (H) 11/15/2016 0639   HDL 32 (L) 11/15/2016 0639   CHOLHDL 9.5 11/15/2016 0639   VLDL 77 (H) 11/15/2016 0639   LDLCALC 194 (H) 11/15/2016 1610    Physical Exam:    VS:  BP 100/74   Pulse 76   Ht  (1.727 m)   Wt 189 lb (85.7 kg)   BMI 28.74 kg/m     Wt Readings from Last 3 Encounters:  03/28/17 189 lb (85.7 kg)  02/18/17 180 lb (81.6 kg)  01/31/17 185 lb (83.9 kg)     GEN:  Well nourished, well developed in no acute distress HEENT: Normal NECK: No JVD; No carotid bruits LYMPHATICS: No lymphadenopathy CARDIAC: RRR, no  murmurs, rubs, gallops RESPIRATORY:  Clear to auscultation without rales, wheezing or rhonchi  ABDOMEN: Soft, non-tender, non-distended MUSCULOSKELETAL:  No edema; No deformity  SKIN: Warm and dry NEUROLOGIC:  Alert and oriented x 3, mild strabismus PSYCHIATRIC:  Normal affect   ASSESSMENT:    1. Coronary artery disease involving native coronary artery of native heart without angina pectoris   2. Mixed hyperlipidemia due to type 2 diabetes mellitus (HCC)   3. DM type 2 with diabetic peripheral neuropathy (HCC)   4. Smoking    PLAN:    In order of problems listed above:  1. CAD: Asymptomatic, very high risk of progression and adverse cardiac events. No  ischemia by nuclear stress test June 2017, no high-grade stenosis by coronary CT angiography May 2018. On aspirin and low-dose beta blocker 2. HLP: Target LDL cholesterol 70 or less. We'll request labs from [place]. 3. DM: Poorly compensated, high risk for both microvascular and macrovascular complications. He already has retinopathy, neuropathy and CAD. On lisinopril for nephro protection. He would be an excellent candidate for SGLT2 inhibitor such as Jardiance. 4. Smoking: Strongly recommended ever effort he can to quit smoking. His girlfriend does not smoke and she should be a good resource to help him with quitting. Due to seizures that recently been difficult to control, we should avoid Wellbutrin and Chantix, but he can use nicotine replacement products.   Medication Adjustments/Labs and Tests Ordered: Current medicines are reviewed at length with the patient today.  Concerns regarding medicines are outlined above.  Orders Placed This Encounter  Procedures  . EKG 12-Lead   No orders of the defined types were placed in this encounter.   Signed, Thurmon Fair, MD  03/28/2017 1:43 PM    [facility]

## 2017-05-13 ENCOUNTER — Telehealth: Payer: Self-pay | Admitting: Cardiovascular Disease

## 2017-05-13 NOTE — Telephone Encounter (Signed)
Received incoming [facility] from [facility]. Records placed in Dr. Erin Hearingroitoru's box. 05/13/17 ab

## 2017-06-03 NOTE — Progress Notes (Signed)
[facility]  PATIENT: Miguel CoeGeorge L Berringer Jr. DOB: Jun 28, 1971   REASON FOR VISIT: Follow-up for seizure disorder HISTORY FROM: Patient   HISTORY OF PRESENT ILLNESS:UPDATE 12/3/2018CM Miguel Diaz, 46 year old male returns for follow-up with history of seizure disorder.  His last seizure occurred in June 2018 and his Keppra was increased to 1500 twice a day at that time.  He has not had further seizure activity he also has a history of BPPV.  The patient is also diabetic with insulin.  He no longer drives due to poor vision.  He returns for reevaluation  Interval history 01/31/2017:AA Patient returns here for episodes of breakthrough seizure, last seen in the hospital in June 2018 when he was on the phone with his girlfriend when he had a sudden cessation of speech and she heard him shaking on the other side sounding like he was having a seizure. 911 EMS found him combative. On arrival he was moving all extremities but not following commands.  EEG was normal. He had increased lactate and white blood cells likely secondary to seizure. Keppra was increased to 1500 mg twice a day. He denies missing any doses or illness. He has since been on an increased dose of Keppra. He is doing well on the keppra. New problem is vertigo. Room spins. Happens with movement. Diagnosed with BPPV.  Ct showed No acute intracranial abnormalities including mass lesion or mass effect, hydrocephalus, extra-axial fluid collection, midline shift, hemorrhage, or acute infarction, large ischemic events (personally reviewed images)   Interval History 05/14/2015:He has been seeing Dr. Roda ShuttersXu since June. Has had several more episodes and hospital admissions for altered consciousness. Keppra was adjusted. He has been on Keppra 1000mg  twice a day since August. No events since then. No side effects from the Keppra.  Interval History 01/02/2015:2 weeks ago he was going out to the car and wife saw him laying in the  yard. They called 911. He was brought to the emergency room. He was confused for 24 hours in the emergency room. He thought his sister was his ex-wife. Didn't know his mother's name. Didn't know the date. The kept him a few nights. He was changed from Depakote to Keppra. He went to the restroom and she heard a knock and she saw him against the wall. He turned face first into the restroom door. He couldn't respond or help. He was seen at the pcp office and his body was actively shaking. He was sent to the ED and was observed. He was confused afterwards for 4 hours. He wanted to sleep. His pcp increased his Keppra to 750 bid. Another EEG was performed on the 15th during his post-ictal period which was normal. Recommend long-term EEG to Dr. Roda ShuttersXu.   11/24/2013:Miguel L Roosvelt MaserWiseman Jr. is a 46 y.o. male here as a referral from Dr. Cleda Clarksheren for acute onset right-sided weakness. He follows with Dr. Roda ShuttersXu in the office however he is seeing me today as Dr. Roda ShuttersXu is not here and patient had another episode of right-sided weakness with altered awareness. PMH of DM, DM neuropathy, HLD, blindness was admitted or or had ER visits 7 times since 01/2014 for episodic LOC, unresponsive to verbal commands, confusion, nonverbal, right sided weakness/heavy feeling, headache, chest pain. Etiology has been unclear. 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 The possibility of complicated migraines has also been question and patient was placed on  Topamax. However most recently patient had another episode and this time she was laying on the couch.   In April, patient was laying on the couch and he was not responding. He was just staring off. His girl friend is with him and provides most information. Eyes wide open. Looking straight in the direction of the TV, no foaming at the mouth, no abnormal movements. The paramedics  came, staring spell lasted until the paramedics came, he would occ grunt. He closed his eyes in 5 minutes but no response except a grunt when someone would say his name. He told the paramedics that his right side was numb, no pain on the right side to noxious stimuli by the paramedics. They took him to the ED and by the time he got to the ED, there was slurred speech and facial droop. There was no headache associated with this episode. He is on topamax  bid for complicated migraines. He was confused afterwards but he ok when got to the hospital. Also c/o memory loss. In [place] there was reportedly an episode of GTCS after sitting on the couch with confusion afterwards   REVIEW OF SYSTEMS: Full 14 system review of systems performed and notable only for those listed, all others are neg:  Constitutional: neg  Cardiovascular: neg Ear/Nose/Throat: neg  Skin: neg Eyes: Loss of vision Respiratory: neg Gastroitestinal: neg  Hematology/Lymphatic: neg  Endocrine: neg Musculoskeletal:neg Allergy/Immunology: neg Neurological: Dizziness seizure Psychiatric: neg Sleep : neg   ALLERGIES: Allergies  Allergen Reactions  . Penicillins Other (See Comments)    Unknown childhood reaction Has patient had a PCN reaction causing immediate rash, facial/tongue/throat swelling, SOB or lightheadedness with hypotension: Unknown Has patient had a PCN reaction causing severe rash involving mucus membranes or skin necrosis: Unknown Has patient had a PCN reaction that required hospitalization: Unknown Has patient had a PCN reaction occurring within the last 10 years: Unknown If all of the above answers are "NO", then may proceed with Cephalosporin use.   Marland Kitchen Toradol [Ketorolac Tromethamine] Hives    HOME MEDICATIONS: Outpatient Medications Prior to Visit  Medication Sig Dispense Refill  . albuterol (PROVENTIL HFA;VENTOLIN HFA) 108 (90 Base) MCG/ACT inhaler Inhale 2 puffs into the lungs every 6 (six) hours as  needed for wheezing or shortness of breath. 2 Inhaler 0  . aspirin 325 MG tablet Take 1 tablet (325 mg total) by mouth daily. 30 tablet 0  . atorvastatin (LIPITOR) 40 MG tablet Take 1 tablet (40 mg total) by mouth at bedtime. 30 tablet 0  . celecoxib (CELEBREX) 200 MG capsule TAKE ONE CAPSULE BY MOUTH EVERY DAY FOR HIP PAIN. DO NOT TAKE IBUPROFEN WHEN ON THIS  2  . FLUoxetine (PROZAC) 40 MG capsule Take 1 capsule (40 mg total) by mouth daily. 30 capsule 0  . fluticasone (FLONASE) 50 MCG/ACT nasal spray Place 1 spray into both nostrils daily as needed for allergies or rhinitis. 15.8 g 0  . glipiZIDE (GLUCOTROL) 10 MG tablet Take 10 mg by mouth daily with breakfast.  0  . insulin glargine (LANTUS) 100 UNIT/ML injection Inject 30 Units into the skin at bedtime.    . levETIRAcetam (KEPPRA) 750 MG tablet Take 2 tablets (1,500 mg total) by mouth 2 (two) times daily. 360 tablet 6  . lisinopril (PRINIVIL,ZESTRIL) 2.5 MG tablet Take 2.5 mg by mouth daily.  5  . LYRICA 150 MG capsule TAKE ONE CAPSULE BY MOUTH TWICE A DAY FOR PAIN  0  . meclizine (ANTIVERT) 25 MG tablet  Take 1 tablet (25 mg total) by mouth 3 (three) times daily as needed for dizziness. 30 tablet 0  . metFORMIN (GLUCOPHAGE-XR) 500 MG 24 hr tablet Take 2 tablets (1,000 mg total) by mouth 2 (two) times daily. 120 tablet 0  . metoprolol succinate (TOPROL-XL) 50 MG 24 hr tablet Take 1 tablet (50 mg total) by mouth daily. Take with or immediately following a meal. 30 tablet 0  . Multiple Vitamins-Minerals (MULTIVITAMIN WITH MINERALS) tablet Take 1 tablet by mouth daily.    Marland Kitchen. HYDROcodone-acetaminophen (NORCO/VICODIN) 5-325 MG tablet Take 1 tablet by mouth every 6 (six) hours as needed. 20 tablet 0   No facility-administered medications prior to visit.     PAST MEDICAL HISTORY: Past Medical History:  Diagnosis Date  . Anxiety   . Asthma   . Blindness    bilateral  . Depression   . Diabetes mellitus without complication (HCC)   .  Hypercholesteremia   . Neuropathy   . PSVT (paroxysmal supraventricular tachycardia) (HCC) 06/2015  . Seizure (HCC)   . Stroke (HCC) 02/2014   right sided weakness/numbness  . Vision loss of left eye     PAST SURGICAL HISTORY: Past Surgical History:  Procedure Laterality Date  . ABDOMINAL SURGERY    . EYE SURGERY Left   . HERNIA REPAIR      FAMILY HISTORY: Family History  Problem Relation Age of Onset  . High blood pressure Mother   . Diabetes Mellitus I Mother   . Congenital heart disease Mother   . High blood pressure Father   . Diabetes Mellitus I Father   . CAD Father [id number]  . Migraines Daughter   . Parkinsonism Daughter   . Parkinsonism Son   . High blood pressure Sister   . Congenital heart disease Sister   . High blood pressure Paternal Grandfather     SOCIAL HISTORY: Social History   Socioeconomic History  . Marital status: Significant Other    Spouse name: Not on file  . Number of children: 2  . Years of education: 9th  . Highest education level: Not on file  Social Needs  . Financial resource strain: Not on file  . Food insecurity - worry: Not on file  . Food insecurity - inability: Not on file  . Transportation needs - medical: Not on file  . Transportation needs - non-medical: Not on file  Occupational History  . Occupation: disabled  Tobacco Use  . Smoking status: Current Every Day Smoker    Packs/day: 0.00    Types: Cigarettes, E-cigarettes  . Smokeless tobacco: Never Used  . Tobacco comment: he does electonic cigarettes sometmes too  Substance and Sexual Activity  . Alcohol use: Yes    Alcohol/week: 0.0 oz  . Drug use: No  . Sexual activity: Not Currently  Other Topics Concern  . Not on file  Social History Narrative    Merged History Encounter    Patient  Lives with his girl friend   Patient is right handed   Patient coffee daily         PHYSICAL EXAM  Vitals:   06/06/17 1314  BP: 112/80  Pulse: 85  Weight: 193 lb 6.4 oz (87.7  kg)  Height: 5\' 8"  (1.727 m)   Body mass index is 29.41 kg/m.  Generalized: Well developed, obese male in no acute distress  Head: normocephalic and atraumatic,. Oropharynx benign  Neck: Supple,  Musculoskeletal: No deformity   Neurological examination   Mentation: Alert oriented  to time, place, history taking. Attention span and concentration appropriate. Recent and remote memory intact.  Follows all commands speech and language fluent.   Cranial nerve II-XII: Fundoscopic exam reveals sharp disc margins.Pupils were equal round reactive to light extraocular movements were full, visual field were full on confrontational test.  Right ptosis.  Facial sensation and strength were normal. hearing was intact to finger rubbing bilaterally. Uvula tongue midline. head turning and shoulder shrug were normal and symmetric.Tongue protrusion into cheek strength was normal. Motor: normal bulk and tone, full strength in the BUE, BLE,  Sensory: normal and symmetric to light touch, Coordination: finger-nose-finger, heel-to-shin bilaterally, no dysmetria Gait and Station: Rising up from seated position without assistance, normal stance,  moderate stride, good arm swing, smooth turning, able to perform tiptoe, and heel walking without difficulty. Tandem gait is steady  DIAGNOSTIC DATA (LABS, IMAGING, TESTING) - I reviewed patient records, labs, notes, testing and imaging myself where available.  Lab Results  Component Value Date   WBC 6.7 12/26/2016   HGB 14.1 12/26/2016   HCT 41.0 12/26/2016   MCV 92.8 12/26/2016   PLT 125 (L) 12/26/2016      Component Value Date/Time   NA 138 12/26/2016 0401   K 3.2 (L) 12/26/2016 0401   CL 104 12/26/2016 0401   CO2 26 12/26/2016 0401   GLUCOSE 230 (H) 12/26/2016 0401   BUN 7 12/26/2016 0401   CREATININE 0.65 12/26/2016 0401   CALCIUM 8.3 (L) 12/26/2016 0401   PROT 7.4 12/24/2016 1352   ALBUMIN 4.5 12/24/2016 1352   AST 36 12/24/2016 1352   ALT 21  12/24/2016 1352   ALKPHOS 133 (H) 12/24/2016 1352   BILITOT 0.7 12/24/2016 1352   GFRNONAA >60 12/26/2016 0401   GFRAA >60 12/26/2016 0401   Lab Results  Component Value Date   CHOL 303 (H) 11/15/2016   HDL 32 (L) 11/15/2016   LDLCALC 194 (H) 11/15/2016   TRIG 385 (H) 11/15/2016   CHOLHDL 9.5 11/15/2016   Lab Results  Component Value Date   HGBA1C 14.0 (H) 11/15/2016    Lab Results  Component Value Date   TSH 1.213 12/14/2015      ASSESSMENT AND PLAN56 year old with epilepsy here to follow-up for seizure disorder.  Last seizure occurred in June 2018.   PMH of DM, DM neuropathy, HLD, blindness was admitted for had multiple ER visitsand recent admission for episodic LOC, unresponsive to verbal commands, confusion, nonverbal, right sided weakness/heavy feeling, headache, chest pain. Etiology has been unclear.    PLAN: Continue ASA and Lipitor for stroke prevention Needs to follow closely with primary care for management of vascular risk factors. Continue Keppra 1500 bid for seizure he does not need refills He does not drive Seizure precautions to be followed,  F/U in 6 months I spent 20 minutes in total face to face time with the patient more than 50% of which was spent counseling and coordination of care, reviewing test results reviewing medications and discussing and reviewing the diagnosis of seizure disorder and stroke prevention Nilda Riggs, [facility], [facility], APRN  [facility] [address], [address] [place], [place] [address] [phone]

## 2017-06-06 ENCOUNTER — Encounter: Payer: Self-pay | Admitting: Nurse Practitioner

## 2017-06-06 ENCOUNTER — Ambulatory Visit (INDEPENDENT_AMBULATORY_CARE_PROVIDER_SITE_OTHER): Payer: Medicare Other | Admitting: Nurse Practitioner

## 2017-06-06 VITALS — BP 112/80 | HR 85 | Ht 68.0 in | Wt 193.4 lb

## 2017-06-06 DIAGNOSIS — I2510 Atherosclerotic heart disease of native coronary artery without angina pectoris: Secondary | ICD-10-CM

## 2017-06-06 DIAGNOSIS — E785 Hyperlipidemia, unspecified: Secondary | ICD-10-CM | POA: Diagnosis not present

## 2017-06-06 DIAGNOSIS — I10 Essential (primary) hypertension: Secondary | ICD-10-CM

## 2017-06-06 DIAGNOSIS — Z8673 Personal history of transient ischemic attack (TIA), and cerebral infarction without residual deficits: Secondary | ICD-10-CM | POA: Diagnosis not present

## 2017-06-06 DIAGNOSIS — G40209 Localization-related (focal) (partial) symptomatic epilepsy and epileptic syndromes with complex partial seizures, not intractable, without status epilepticus: Secondary | ICD-10-CM | POA: Diagnosis not present

## 2017-06-06 NOTE — Patient Instructions (Signed)
Continue Keppra 1500 bid for seizure He does not drive Seizure precautions,  F/U in 6 months

## 2017-06-09 NOTE — Progress Notes (Signed)
Personally  participated in, made any corrections needed, and agree with history, physical, neuro exam,assessment and plan as stated above.    Londell Noll, MD [facility] 

## 2017-12-01 NOTE — Progress Notes (Deleted)
[facility]  PATIENT: Miguel CoeGeorge L Berringer Jr. DOB: Jun 28, 1971   REASON FOR VISIT: Follow-up for seizure disorder HISTORY FROM: Patient   HISTORY OF PRESENT ILLNESS:UPDATE 12/3/2018CM Miguel Diaz, 47 year old male returns for follow-up with history of seizure disorder.  His last seizure occurred in June 2018 and his Keppra was increased to 1500 twice a day at that time.  He has not had further seizure activity he also has a history of BPPV.  The patient is also diabetic with insulin.  He no longer drives due to poor vision.  He returns for reevaluation  Interval history 01/31/2017:AA Patient returns here for episodes of breakthrough seizure, last seen in the hospital in June 2018 when he was on the phone with his girlfriend when he had a sudden cessation of speech and she heard him shaking on the other side sounding like he was having a seizure. 911 EMS found him combative. On arrival he was moving all extremities but not following commands.  EEG was normal. He had increased lactate and white blood cells likely secondary to seizure. Keppra was increased to 1500 mg twice a day. He denies missing any doses or illness. He has since been on an increased dose of Keppra. He is doing well on the keppra. New problem is vertigo. Room spins. Happens with movement. Diagnosed with BPPV.  Ct showed No acute intracranial abnormalities including mass lesion or mass effect, hydrocephalus, extra-axial fluid collection, midline shift, hemorrhage, or acute infarction, large ischemic events (personally reviewed images)   Interval History 05/14/2015:He has been seeing Dr. Roda ShuttersXu since June. Has had several more episodes and hospital admissions for altered consciousness. Keppra was adjusted. He has been on Keppra 1000mg  twice a day since August. No events since then. No side effects from the Keppra.  Interval History 01/02/2015:2 weeks ago he was going out to the car and wife saw him laying in the  yard. They called 911. He was brought to the emergency room. He was confused for 24 hours in the emergency room. He thought his sister was his ex-wife. Didn't know his mother's name. Didn't know the date. The kept him a few nights. He was changed from Depakote to Keppra. He went to the restroom and she heard a knock and she saw him against the wall. He turned face first into the restroom door. He couldn't respond or help. He was seen at the pcp office and his body was actively shaking. He was sent to the ED and was observed. He was confused afterwards for 4 hours. He wanted to sleep. His pcp increased his Keppra to 750 bid. Another EEG was performed on the 15th during his post-ictal period which was normal. Recommend long-term EEG to Dr. Roda ShuttersXu.   11/24/2013:Miguel L Roosvelt MaserWiseman Jr. is a 47 y.o. male here as a referral from Dr. Cleda Clarksheren for acute onset right-sided weakness. He follows with Dr. Roda ShuttersXu in the office however he is seeing me today as Dr. Roda ShuttersXu is not here and patient had another episode of right-sided weakness with altered awareness. PMH of DM, DM neuropathy, HLD, blindness was admitted or or had ER visits 7 times since 01/2014 for episodic LOC, unresponsive to verbal commands, confusion, nonverbal, right sided weakness/heavy feeling, headache, chest pain. Etiology has been unclear. 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 The possibility of complicated migraines has also been question and patient was placed on  Topamax. However most recently patient had another episode and this time she was laying on the couch.   In April, patient was laying on the couch and he was not responding. He was just staring off. His girl friend is with him and provides most information. Eyes wide open. Looking straight in the direction of the TV, no foaming at the mouth, no abnormal movements. The paramedics  came, staring spell lasted until the paramedics came, he would occ grunt. He closed his eyes in 5 minutes but no response except a grunt when someone would say his name. He told the paramedics that his right side was numb, no pain on the right side to noxious stimuli by the paramedics. They took him to the ED and by the time he got to the ED, there was slurred speech and facial droop. There was no headache associated with this episode. He is on topamax  bid for complicated migraines. He was confused afterwards but he ok when got to the hospital. Also c/o memory loss. In [place] there was reportedly an episode of GTCS after sitting on the couch with confusion afterwards   REVIEW OF SYSTEMS: Full 14 system review of systems performed and notable only for those listed, all others are neg:  Constitutional: neg  Cardiovascular: neg Ear/Nose/Throat: neg  Skin: neg Eyes: Loss of vision Respiratory: neg Gastroitestinal: neg  Hematology/Lymphatic: neg  Endocrine: neg Musculoskeletal:neg Allergy/Immunology: neg Neurological: Dizziness seizure Psychiatric: neg Sleep : neg   ALLERGIES: Allergies  Allergen Reactions  . Penicillins Other (See Comments)    Unknown childhood reaction Has patient had a PCN reaction causing immediate rash, facial/tongue/throat swelling, SOB or lightheadedness with hypotension: Unknown Has patient had a PCN reaction causing severe rash involving mucus membranes or skin necrosis: Unknown Has patient had a PCN reaction that required hospitalization: Unknown Has patient had a PCN reaction occurring within the last 10 years: Unknown If all of the above answers are "NO", then may proceed with Cephalosporin use.   Marland Kitchen Toradol [Ketorolac Tromethamine] Hives    HOME MEDICATIONS: Outpatient Medications Prior to Visit  Medication Sig Dispense Refill  . albuterol (PROVENTIL HFA;VENTOLIN HFA) 108 (90 Base) MCG/ACT inhaler Inhale 2 puffs into the lungs every 6 (six) hours as  needed for wheezing or shortness of breath. 2 Inhaler 0  . aspirin 325 MG tablet Take 1 tablet (325 mg total) by mouth daily. 30 tablet 0  . atorvastatin (LIPITOR) 40 MG tablet Take 1 tablet (40 mg total) by mouth at bedtime. 30 tablet 0  . celecoxib (CELEBREX) 200 MG capsule TAKE ONE CAPSULE BY MOUTH EVERY DAY FOR HIP PAIN. DO NOT TAKE IBUPROFEN WHEN ON THIS  2  . FLUoxetine (PROZAC) 40 MG capsule Take 1 capsule (40 mg total) by mouth daily. 30 capsule 0  . fluticasone (FLONASE) 50 MCG/ACT nasal spray Place 1 spray into both nostrils daily as needed for allergies or rhinitis. 15.8 g 0  . glipiZIDE (GLUCOTROL) 10 MG tablet Take 10 mg by mouth daily with breakfast.  0  . insulin glargine (LANTUS) 100 UNIT/ML injection Inject 30 Units into the skin at bedtime.    . levETIRAcetam (KEPPRA) 750 MG tablet Take 2 tablets (1,500 mg total) by mouth 2 (two) times daily. 360 tablet 6  . lisinopril (PRINIVIL,ZESTRIL) 2.5 MG tablet Take 2.5 mg by mouth daily.  5  . LYRICA 150 MG capsule TAKE ONE CAPSULE BY MOUTH TWICE A DAY FOR PAIN  0  . meclizine (ANTIVERT) 25 MG tablet  Take 1 tablet (25 mg total) by mouth 3 (three) times daily as needed for dizziness. 30 tablet 0  . metFORMIN (GLUCOPHAGE-XR) 500 MG 24 hr tablet Take 2 tablets (1,000 mg total) by mouth 2 (two) times daily. 120 tablet 0  . metoprolol succinate (TOPROL-XL) 50 MG 24 hr tablet Take 1 tablet (50 mg total) by mouth daily. Take with or immediately following a meal. 30 tablet 0  . Multiple Vitamins-Minerals (MULTIVITAMIN WITH MINERALS) tablet Take 1 tablet by mouth daily.     No facility-administered medications prior to visit.     PAST MEDICAL HISTORY: Past Medical History:  Diagnosis Date  . Anxiety   . Asthma   . Blindness    bilateral  . Depression   . Diabetes mellitus without complication (HCC)   . Hypercholesteremia   . Neuropathy   . PSVT (paroxysmal supraventricular tachycardia) (HCC) 06/2015  . Seizure (HCC)   . Stroke (HCC)  02/2014   right sided weakness/numbness  . Vision loss of left eye     PAST SURGICAL HISTORY: Past Surgical History:  Procedure Laterality Date  . ABDOMINAL SURGERY    . EYE SURGERY Left   . HERNIA REPAIR      FAMILY HISTORY: Family History  Problem Relation Age of Onset  . High blood pressure Mother   . Diabetes Mellitus I Mother   . Congenital heart disease Mother   . High blood pressure Father   . Diabetes Mellitus I Father   . CAD Father 8  . Migraines Daughter   . Parkinsonism Daughter   . Parkinsonism Son   . High blood pressure Sister   . Congenital heart disease Sister   . High blood pressure Paternal Grandfather     SOCIAL HISTORY: Social History   Socioeconomic History  . Marital status: Significant Other    Spouse name: Not on file  . Number of children: 2  . Years of education: 9th  . Highest education level: Not on file  Occupational History  . Occupation: disabled  Social Needs  . Financial resource strain: Not on file  . Food insecurity:    Worry: Not on file    Inability: Not on file  . Transportation needs:    Medical: Not on file    Non-medical: Not on file  Tobacco Use  . Smoking status: Current Every Day Smoker    Packs/day: 0.00    Types: Cigarettes, E-cigarettes  . Smokeless tobacco: Never Used  . Tobacco comment: he does electonic cigarettes sometmes too  Substance and Sexual Activity  . Alcohol use: Yes    Alcohol/week: 0.0 oz  . Drug use: No  . Sexual activity: Not Currently  Lifestyle  . Physical activity:    Days per week: Not on file    Minutes per session: Not on file  . Stress: Not on file  Relationships  . Social connections:    Talks on phone: Not on file    Gets together: Not on file    Attends religious service: Not on file    Active member of club or organization: Not on file    Attends meetings of clubs or organizations: Not on file    Relationship status: Not on file  . Intimate partner violence:    Fear of  current or ex partner: Not on file    Emotionally abused: Not on file    Physically abused: Not on file    Forced sexual activity: Not on file  Other  Topics Concern  . Not on file  Social History Narrative    Merged History Encounter    Patient  Lives with his girl friend   Patient is right handed   Patient coffee daily         PHYSICAL EXAM  There were no vitals filed for this visit. There is no height or weight on file to calculate BMI.  Generalized: Well developed, obese male in no acute distress  Head: normocephalic and atraumatic,. Oropharynx benign  Neck: Supple,  Musculoskeletal: No deformity   Neurological examination   Mentation: Alert oriented to time, place, history taking. Attention span and concentration appropriate. Recent and remote memory intact.  Follows all commands speech and language fluent.   Cranial nerve II-XII: Fundoscopic exam reveals sharp disc margins.Pupils were equal round reactive to light extraocular movements were full, visual field were full on confrontational test.  Right ptosis.  Facial sensation and strength were normal. hearing was intact to finger rubbing bilaterally. Uvula tongue midline. head turning and shoulder shrug were normal and symmetric.Tongue protrusion into cheek strength was normal. Motor: normal bulk and tone, full strength in the BUE, BLE,  Sensory: normal and symmetric to light touch, Coordination: finger-nose-finger, heel-to-shin bilaterally, no dysmetria Gait and Station: Rising up from seated position without assistance, normal stance,  moderate stride, good arm swing, smooth turning, able to perform tiptoe, and heel walking without difficulty. Tandem gait is steady  DIAGNOSTIC DATA (LABS, IMAGING, TESTING) - I reviewed patient records, labs, notes, testing and imaging myself where available.  Lab Results  Component Value Date   WBC 6.7 12/26/2016   HGB 14.1 12/26/2016   HCT 41.0 12/26/2016   MCV 92.8 12/26/2016    PLT 125 (L) 12/26/2016      Component Value Date/Time   NA 138 12/26/2016 0401   K 3.2 (L) 12/26/2016 0401   CL 104 12/26/2016 0401   CO2 26 12/26/2016 0401   GLUCOSE 230 (H) 12/26/2016 0401   BUN 7 12/26/2016 0401   CREATININE 0.65 12/26/2016 0401   CALCIUM 8.3 (L) 12/26/2016 0401   PROT 7.4 12/24/2016 1352   ALBUMIN 4.5 12/24/2016 1352   AST 36 12/24/2016 1352   ALT 21 12/24/2016 1352   ALKPHOS 133 (H) 12/24/2016 1352   BILITOT 0.7 12/24/2016 1352   GFRNONAA >60 12/26/2016 0401   GFRAA >60 12/26/2016 0401   Lab Results  Component Value Date   CHOL 303 (H) 11/15/2016   HDL 32 (L) 11/15/2016   LDLCALC 194 (H) 11/15/2016   TRIG 385 (H) 11/15/2016   CHOLHDL 9.5 11/15/2016   Lab Results  Component Value Date   HGBA1C 14.0 (H) 11/15/2016    Lab Results  Component Value Date   TSH 1.213 12/14/2015      ASSESSMENT AND PLAN38 year old with epilepsy here to follow-up for seizure disorder.  Last seizure occurred in June 2018.   PMH of DM, DM neuropathy, HLD, blindness was admitted for had multiple ER visitsand recent admission for episodic LOC, unresponsive to verbal commands, confusion, nonverbal, right sided weakness/heavy feeling, headache, chest pain. Etiology has been unclear.    PLAN: Continue ASA and Lipitor for stroke prevention Needs to follow closely with primary care for management of vascular risk factors. Continue Keppra 1500 bid for seizure he does not need refills He does not drive Seizure precautions to be followed,  F/U in 6 months I spent 20 minutes in total face to face time with the patient more than 50% of  which was spent counseling and coordination of care, reviewing test results reviewing medications and discussing and reviewing the diagnosis of seizure disorder and stroke prevention Nilda Riggs, [facility], [facility], APRN  [facility] [address] [place], [place] [address] [phone]

## 2017-12-05 ENCOUNTER — Ambulatory Visit: Payer: Medicare Other | Admitting: Nurse Practitioner

## 2017-12-06 ENCOUNTER — Encounter: Payer: Self-pay | Admitting: Nurse Practitioner

## 2017-12-06 NOTE — Progress Notes (Signed)
[facility]  PATIENT: Miguel Diaz. DOB: 1970-11-14   REASON FOR VISIT: Follow-up for seizure disorder new complaint of daytime drowsiness and snoring HISTORY FROM: Patient   HISTORY OF PRESENT ILLNESS:UPDATE 6/5/2019CM Miguel Diaz, 47 year old male returns for follow-up with history of seizure disorder.  Last seizure occurred in June 2018.  He is currently on Keppra 1500 mg twice a day.  He also has a history of BPPV.  He is diabetic on insulin.  Most recent hemoglobin A1c on 09/12/2017 he has a history of stroke and is on aspirin and Lipitor for secondary stroke prevention without further stroke or TIA symptoms.  He ambulates with a cane no recent falls.  He has a new complaint of snoring and daytime drowsiness.  He has never had a sleep study.  He returns for reevaluation   UPDATE 12/3/2018CM Miguel Diaz, 47 year old male returns for follow-up with history of seizure disorder.  His last seizure occurred in June 2018 and his Keppra was increased to 1500 twice a day at that time.  He has not had further seizure activity he also has a history of BPPV.  The patient is also diabetic with insulin.  He no longer drives due to poor vision.  He returns for reevaluation  Interval history 01/31/2017:AA Patient returns here for episodes of breakthrough seizure, last seen in the hospital in June 2018 when he was on the phone with his girlfriend when he had a sudden cessation of speech and she heard him shaking on the other side sounding like he was having a seizure. 911 EMS found him combative. On arrival he was moving all extremities but not following commands.  EEG was normal. He had increased lactate and white blood cells likely secondary to seizure. Keppra was increased to 1500 mg twice a day. He denies missing any doses or illness. He has since been on an increased dose of Keppra. He is doing well on the keppra. New problem is vertigo. Room spins. Happens with movement.  Diagnosed with BPPV.  Ct showed No acute intracranial abnormalities including mass lesion or mass effect, hydrocephalus, extra-axial fluid collection, midline shift, hemorrhage, or acute infarction, large ischemic events (personally reviewed images)   Interval History 05/14/2015:He has been seeing Dr. Roda Shutters since June. Has had several more episodes and hospital admissions for altered consciousness. Keppra was adjusted. He has been on Keppra 1000mg  twice a day since August. No events since then. No side effects from the Keppra.  Interval History 01/02/2015:2 weeks ago he was going out to the car and wife saw him laying in the yard. They called 911. He was brought to the emergency room. He was confused for 24 hours in the emergency room. He thought his sister was his ex-wife. Didn't know his mother's name. Didn't know the date. The kept him a few nights. He was changed from Depakote to Keppra. He went to the restroom and she heard a knock and she saw him against the wall. He turned face first into the restroom door. He couldn't respond or help. He was seen at the pcp office and his body was actively shaking. He was sent to the ED and was observed. He was confused afterwards for 4 hours. He wanted to sleep. His pcp increased his Keppra to 750 bid. Another EEG was performed on the 15th during his post-ictal period which was normal. Recommend long-term EEG to Dr. Roda Shutters.   11/24/2013:Miguel Diaz. is a 47 y.o. male here as a  referral from Dr. Cleda Clarksheren for acute onset right-sided weakness. He follows with Dr. Roda ShuttersXu in the office however he is seeing me today as Dr. Roda ShuttersXu is not here and patient had another episode of right-sided weakness with altered awareness. PMH of DM, DM neuropathy, HLD, blindness was admitted or or had ER visits 7 times since 01/2014 for episodic LOC, unresponsive to verbal commands, confusion, nonverbal, right sided weakness/heavy feeling, headache, chest pain. Etiology has been unclear. Most  times it happened when pt standing or from sitting or bending down to standing position, questioning oorthostatic hypotension and autonomic neuropathy due to diabetes. The possibility of complicated migraines has also been question and patient was placed on Topamax. However most recently patient had another episode and this time she was laying on the couch.   In April, patient was laying on the couch and he was not responding. He was just staring off. His girl friend is with him and provides most information. Eyes wide open. Looking straight in the direction of the TV, no foaming at the mouth, no abnormal movements. The paramedics came, staring spell lasted until the paramedics came, he would occ grunt. He closed his eyes in 5 minutes but no response except a grunt when someone would say his name. He told the paramedics that his right side was numb, no pain on the right side to noxious stimuli by the paramedics. They took him to the ED and by the time he got to the ED, there was slurred speech and facial droop. There was no headache associated with this episode. He is on topamax 50mg  bid for complicated migraines. He was confused afterwards but he ok when got to the hospital. Also c/o memory loss. In [place] there was reportedly an episode of GTCS after sitting on the couch with confusion afterwards   REVIEW OF SYSTEMS: Full 14 system review of systems performed and notable only for those listed, all others are neg:  Constitutional: neg  Cardiovascular: neg Ear/Nose/Throat: neg  Skin: neg Eyes: Loss of vision Respiratory: Cough Gastroitestinal: neg  Hematology/Lymphatic: neg  Endocrine: neg Musculoskeletal:neg Allergy/Immunology: neg Neurological: Dizziness seizure Psychiatric: neg Sleep : Snoring daytime drowsiness   ALLERGIES: Allergies  Allergen Reactions  . Penicillins Other (See Comments)    Unknown childhood reaction Has patient had a PCN reaction causing immediate rash,  facial/tongue/throat swelling, SOB or lightheadedness with hypotension: Unknown Has patient had a PCN reaction causing severe rash involving mucus membranes or skin necrosis: Unknown Has patient had a PCN reaction that required hospitalization: Unknown Has patient had a PCN reaction occurring within the last 10 years: Unknown If all of the above answers are "NO", then may proceed with Cephalosporin use.   Marland Kitchen. Toradol [Ketorolac Tromethamine] Hives    HOME MEDICATIONS: Outpatient Medications Prior to Visit  Medication Sig Dispense Refill  . albuterol (PROVENTIL HFA;VENTOLIN HFA) 108 (90 Base) MCG/ACT inhaler Inhale 2 puffs into the lungs every 6 (six) hours as needed for wheezing or shortness of breath. 2 Inhaler 0  . aspirin 325 MG tablet Take 1 tablet (325 mg total) by mouth daily. 30 tablet 0  . atorvastatin (LIPITOR) 40 MG tablet Take 1 tablet (40 mg total) by mouth at bedtime. 30 tablet 0  . celecoxib (CELEBREX) 200 MG capsule TAKE ONE CAPSULE BY MOUTH EVERY DAY FOR HIP PAIN. DO NOT TAKE IBUPROFEN WHEN ON THIS  2  . FLUoxetine (PROZAC) 40 MG capsule Take 1 capsule (40 mg total) by mouth daily. 30 capsule 0  .  fluticasone (FLONASE) 50 MCG/ACT nasal spray Place 1 spray into both nostrils daily as needed for allergies or rhinitis. 15.8 g 0  . glipiZIDE (GLUCOTROL) 10 MG tablet Take 10 mg by mouth 2 (two) times daily before a meal.   0  . insulin glargine (LANTUS) 100 UNIT/ML injection Inject 30 Units into the skin at bedtime. Takes 10units in AM    . levETIRAcetam (KEPPRA) 750 MG tablet Take 2 tablets (1,500 mg total) by mouth 2 (two) times daily. 360 tablet 6  . lisinopril (PRINIVIL,ZESTRIL) 2.5 MG tablet Take 2.5 mg by mouth daily.  5  . LYRICA 150 MG capsule TAKE ONE CAPSULE BY MOUTH TWICE A DAY FOR PAIN  0  . meclizine (ANTIVERT) 25 MG tablet Take 1 tablet (25 mg total) by mouth 3 (three) times daily as needed for dizziness. 30 tablet 0  . metFORMIN (GLUCOPHAGE-XR) 500 MG 24 hr tablet Take  2 tablets (1,000 mg total) by mouth 2 (two) times daily. 120 tablet 0  . metoprolol succinate (TOPROL-XL) 50 MG 24 hr tablet Take 1 tablet (50 mg total) by mouth daily. Take with or immediately following a meal. 30 tablet 0  . Multiple Vitamins-Minerals (MULTIVITAMIN WITH MINERALS) tablet Take 1 tablet by mouth daily.     No facility-administered medications prior to visit.     PAST MEDICAL HISTORY: Past Medical History:  Diagnosis Date  . Anxiety   . Asthma   . Blindness    bilateral  . Depression   . Diabetes mellitus without complication (HCC)   . Hypercholesteremia   . Neuropathy   . PSVT (paroxysmal supraventricular tachycardia) (HCC) 06/2015  . Seizure (HCC)   . Stroke (HCC) 02/2014   right sided weakness/numbness  . Vision loss of left eye     PAST SURGICAL HISTORY: Past Surgical History:  Procedure Laterality Date  . ABDOMINAL SURGERY    . EYE SURGERY Left   . HERNIA REPAIR      FAMILY HISTORY: Family History  Problem Relation Age of Onset  . High blood pressure Mother   . Diabetes Mellitus I Mother   . Congenital heart disease Mother   . High blood pressure Father   . Diabetes Mellitus I Father   . CAD Father 58  . Migraines Daughter   . Parkinsonism Daughter   . Parkinsonism Son   . High blood pressure Sister   . Congenital heart disease Sister   . High blood pressure Paternal Grandfather     SOCIAL HISTORY: Social History   Socioeconomic History  . Marital status: Significant Other    Spouse name: Not on file  . Number of children: 2  . Years of education: 9th  . Highest education level: Not on file  Occupational History  . Occupation: disabled  Social Needs  . Financial resource strain: Not on file  . Food insecurity:    Worry: Not on file    Inability: Not on file  . Transportation needs:    Medical: Not on file    Non-medical: Not on file  Tobacco Use  . Smoking status: Current Every Day Smoker    Packs/day: 1.00    Types:  Cigarettes, E-cigarettes  . Smokeless tobacco: Never Used  . Tobacco comment: he does electonic cigarettes sometmes too  Substance and Sexual Activity  . Alcohol use: Not Currently    Alcohol/week: 0.0 oz  . Drug use: No  . Sexual activity: Not Currently  Lifestyle  . Physical activity:  Days per week: Not on file    Minutes per session: Not on file  . Stress: Not on file  Relationships  . Social connections:    Talks on phone: Not on file    Gets together: Not on file    Attends religious service: Not on file    Active member of club or organization: Not on file    Attends meetings of clubs or organizations: Not on file    Relationship status: Not on file  . Intimate partner violence:    Fear of current or ex partner: Not on file    Emotionally abused: Not on file    Physically abused: Not on file    Forced sexual activity: Not on file  Other Topics Concern  . Not on file  Social History Narrative    Merged History Encounter    Patient  Lives with his girl friend   Patient is right handed   Patient coffee daily         PHYSICAL EXAM  Vitals:   12/07/17 1233  BP: 124/84  Pulse: (!) 101  Weight: 206 lb 12.8 oz (93.8 kg)  Height: 5\' 8"  (1.727 m)   Body mass index is 31.44 kg/m.  Generalized: Well developed, obese male in no acute distress  Head: normocephalic and atraumatic,. Oropharynx benign  Neck: Supple,  Musculoskeletal: No deformity   Neurological examination   Mentation: Alert oriented to time, place, history taking. Attention span and concentration appropriate. Recent and remote memory intact.  Follows all commands speech and language fluent.   Cranial nerve II-XII: Pupils were equal round reactive to light extraocular movements were full, visual field were full on confrontational test.  Right ptosis.  Facial sensation and strength were normal. hearing was intact to finger rubbing bilaterally. Uvula tongue midline. head turning and shoulder shrug were  normal and symmetric.Tongue protrusion into cheek strength was normal. Motor: normal bulk and tone, full strength in the BUE, BLE,  Sensory: normal and symmetric to light touch, Coordination: finger-nose-finger, heel-to-shin bilaterally, no dysmetria Gait and Station: Rising up from seated position without assistance, normal stance,  moderate stride, good arm swing, smooth turning, able to perform tiptoe, and heel walking without difficulty. Tandem gait is steady.  Ambulates with a single-point  DIAGNOSTIC DATA (LABS, IMAGING, TESTING) - I reviewed patient records, labs, notes, testing and imaging myself where available.  Lab Results  Component Value Date   WBC 6.7 12/26/2016   HGB 14.1 12/26/2016   HCT 41.0 12/26/2016   MCV 92.8 12/26/2016   PLT 125 (L) 12/26/2016      Component Value Date/Time   NA 138 12/26/2016 0401   K 3.2 (L) 12/26/2016 0401   CL 104 12/26/2016 0401   CO2 26 12/26/2016 0401   GLUCOSE 230 (H) 12/26/2016 0401   BUN 7 12/26/2016 0401   CREATININE 0.65 12/26/2016 0401   CALCIUM 8.3 (L) 12/26/2016 0401   PROT 7.4 12/24/2016 1352   ALBUMIN 4.5 12/24/2016 1352   AST 36 12/24/2016 1352   ALT 21 12/24/2016 1352   ALKPHOS 133 (H) 12/24/2016 1352   BILITOT 0.7 12/24/2016 1352   GFRNONAA >60 12/26/2016 0401   GFRAA >60 12/26/2016 0401   Lab Results  Component Value Date   CHOL 303 (H) 11/15/2016   HDL 32 (L) 11/15/2016   LDLCALC 194 (H) 11/15/2016   TRIG 385 (H) 11/15/2016   CHOLHDL 9.5 11/15/2016   Lab Results  Component Value Date   HGBA1C 14.0 (H)  11/15/2016    Lab Results  Component Value Date   TSH 1.213 12/14/2015      ASSESSMENT AND PLAN55 year old with epilepsy here to follow-up for seizure disorder.  Last seizure occurred in June 2018.   PMH of DM, DM neuropathy, HLD, blindness was admitted for  multiple ER visitsand recent admission 02/18/17 for episodic LOC, unresponsive to verbal commands, confusion, nonverbal, right sided weakness/heavy  feeling, headache, chest pain. Etiology has been unclear.    PLAN: Continue ASA and Lipitor for stroke prevention Needs to follow closely with primary care for management of vascular risk factors. Continue Keppra 1500 bid for seizure will refill He does not drive Seizure precautions to be followed,  For snoring and daytime drowsiness sleep study F/U in 8 months I spent 25 minutes in total face to face time with the patient more than 50% of which was spent counseling and coordination of care, reviewing test results reviewing medications and discussing and reviewing the diagnosis of seizure disorder and stroke prevention and  untreated obstructive sleep apnea and risk Nilda Riggs, [facility], [facility], APRN  [facility] [address] [place], [place] [address] [phone]

## 2017-12-07 ENCOUNTER — Encounter: Payer: Self-pay | Admitting: Nurse Practitioner

## 2017-12-07 ENCOUNTER — Ambulatory Visit (INDEPENDENT_AMBULATORY_CARE_PROVIDER_SITE_OTHER): Payer: Medicare Other | Admitting: Nurse Practitioner

## 2017-12-07 VITALS — BP 124/84 | HR 101 | Ht 68.0 in | Wt 206.8 lb

## 2017-12-07 DIAGNOSIS — R400 Somnolence: Secondary | ICD-10-CM | POA: Diagnosis not present

## 2017-12-07 DIAGNOSIS — R0683 Snoring: Secondary | ICD-10-CM | POA: Diagnosis not present

## 2017-12-07 DIAGNOSIS — R569 Unspecified convulsions: Secondary | ICD-10-CM

## 2017-12-07 DIAGNOSIS — E785 Hyperlipidemia, unspecified: Secondary | ICD-10-CM | POA: Diagnosis not present

## 2017-12-07 DIAGNOSIS — Z8673 Personal history of transient ischemic attack (TIA), and cerebral infarction without residual deficits: Secondary | ICD-10-CM

## 2017-12-07 MED ORDER — LEVETIRACETAM 750 MG PO TABS: 1500.0000 mg | ORAL_TABLET | Freq: Two times a day (BID) | ORAL | 2 refills | Status: DC

## 2017-12-07 NOTE — Patient Instructions (Addendum)
Continue ASA and Lipitor for stroke prevention Needs to follow closely with primary care for management of vascular risk factors. Continue Keppra 1500 bid for seizure He does not drive Seizure precautions to be followed,  For snoring and daytime drowsiness sleep study F/U in 8months for seizures

## 2017-12-13 ENCOUNTER — Encounter: Payer: Self-pay | Admitting: Neurology

## 2017-12-13 ENCOUNTER — Ambulatory Visit (INDEPENDENT_AMBULATORY_CARE_PROVIDER_SITE_OTHER): Payer: Medicare Other | Admitting: Neurology

## 2017-12-13 VITALS — BP 107/73 | HR 90 | Ht 68.0 in | Wt 209.0 lb

## 2017-12-13 DIAGNOSIS — H5410 Blindness, one eye, low vision other eye, unspecified eyes: Secondary | ICD-10-CM

## 2017-12-13 DIAGNOSIS — R0683 Snoring: Secondary | ICD-10-CM | POA: Diagnosis not present

## 2017-12-13 DIAGNOSIS — R400 Somnolence: Secondary | ICD-10-CM | POA: Diagnosis not present

## 2017-12-13 DIAGNOSIS — G40909 Epilepsy, unspecified, not intractable, without status epilepticus: Secondary | ICD-10-CM

## 2017-12-13 DIAGNOSIS — Z8673 Personal history of transient ischemic attack (TIA), and cerebral infarction without residual deficits: Secondary | ICD-10-CM

## 2017-12-13 DIAGNOSIS — E669 Obesity, unspecified: Secondary | ICD-10-CM | POA: Diagnosis not present

## 2017-12-13 NOTE — Patient Instructions (Signed)

## 2017-12-13 NOTE — Progress Notes (Signed)
Subjective:    Patient ID: Miguel Diaz. is a 47 y.o. male.  HPI     Huston Foley, MD, PhD [facility] [address], [address] [address] [place], [place] [address]  Dear Miguel Diaz,   I saw your patient, Miguel Diaz, upon your kind request in my clinic today for initial consultation of his sleep disorder, in particular, concern for underlying obstructive sleep apnea. The patient is accompanied by his wife today. As you know, Mr. Miguel Diaz is a 47 year old right-handed gentleman with an underlying medical history of stroke, seizure disorder, neuropathy, diabetes, depression, asthma, anxiety, vision loss, hyperlipidemia, and obesity, who reports snoring and excessive daytime somnolence. He has witnessed apneas, per wife's report. I reviewed your office note from 12/07/2017. He had a home sleep test many years ago but results are unknown. His Epworth sleepiness score is 19 out of 24, fatigue score is 59 out of 63.  He reports a family history of epilepsy. 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 He had reduced his smoking but is now back to one pack per day. He does not drink alcohol on a regular basis, last time he tried drinking some beer it tasted badly. He has diabetic neuropathy. 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 He denies nighttime nocturia or morning headaches. He does not drive. His wife has sleep apnea and uses CPAP successfully. He would be willing to consider CPAP therapy. Bedtime is around midnight and rises type varies, he has significant sleep disruption and gets out of bed around 10 or 11. He drinks caffeine in the form of half decaf coffee, 1 pot per day, he does not typically drink a whole lot of water he admits. He has some residual right-sided weakness from his stroke. He and his wife had to move in with her sister. There are 2 dogs in the household, mother also lives there.  His Past Medical History Is  Significant For: Past Medical History:  Diagnosis Date  . Anxiety   . Asthma   . Blindness    bilateral  . Depression   . Diabetes mellitus without complication (HCC)   . Hypercholesteremia   . Neuropathy   . PSVT (paroxysmal supraventricular tachycardia) (HCC) 06/2015  . Seizure (HCC)   . Stroke (HCC) 02/2014   right sided weakness/numbness  . Vision loss of left eye     His Past Surgical History Is Significant For: Past Surgical History:  Procedure Laterality Date  . ABDOMINAL SURGERY    . EYE SURGERY Left   . HERNIA REPAIR      His Family History Is Significant For: Family History  Problem Relation Age of Onset  . High blood pressure Mother   . Diabetes Mellitus I Mother   . Congenital heart disease Mother   . High blood pressure Father   . Diabetes Mellitus I Father   . CAD Father 38  . Migraines Daughter   . Parkinsonism Daughter   . Parkinsonism Son   . High blood pressure Sister   . Congenital heart disease Sister   . High blood pressure Paternal Grandfather     His Social History Is Significant For: Social History   Socioeconomic History  . Marital status: Significant Other    Spouse name: Not on file  . Number of children: 2  . Years of education: 9th  . Highest education level: Not on file  Occupational History  . Occupation:  disabled  Social Needs  . Financial resource strain: Not on file  . Food insecurity:    Worry: Not on file    Inability: Not on file  . Transportation needs:    Medical: Not on file    Non-medical: Not on file  Tobacco Use  . Smoking status: Current Every Day Smoker    Packs/day: 1.00    Types: Cigarettes, E-cigarettes  . Smokeless tobacco: Never Used  . Tobacco comment: he does electonic cigarettes sometmes too  Substance and Sexual Activity  . Alcohol use: Not Currently    Alcohol/week: 0.0 oz  . Drug use: No  . Sexual activity: Not Currently  Lifestyle  . Physical activity:    Days per week: Not on file     Minutes per session: Not on file  . Stress: Not on file  Relationships  . Social connections:    Talks on phone: Not on file    Gets together: Not on file    Attends religious service: Not on file    Active member of club or organization: Not on file    Attends meetings of clubs or organizations: Not on file    Relationship status: Not on file  Other Topics Concern  . Not on file  Social History Narrative    Merged History Encounter    Patient  Lives with his girl friend   Patient is right handed   Patient coffee daily        His Allergies Are:  Allergies  Allergen Reactions  . Penicillins Other (See Comments)    Unknown childhood reaction Has patient had a PCN reaction causing immediate rash, facial/tongue/throat swelling, SOB or lightheadedness with hypotension: Unknown Has patient had a PCN reaction causing severe rash involving mucus membranes or skin necrosis: Unknown Has patient had a PCN reaction that required hospitalization: Unknown Has patient had a PCN reaction occurring within the last 10 years: Unknown If all of the above answers are "NO", then may proceed with Cephalosporin use.   . Toradol [Ketorolac Tromethamine] Hives  :   His Current Medications Are:  Outpatient Encounter Medications as of 12/13/2017  Medication Sig  . albuterol (PROVENTIL HFA;VENTOLIN HFA) 108 (90 Base) MCG/ACT inhaler Inhale 2 puffs into the lungs every 6 (six) hours as needed for wheezing or shortness of breath.  Marland Kitchen aspirin 325 MG tablet Take 1 tablet (325 mg total) by mouth daily.  Marland Kitchen atorvastatin (LIPITOR) 40 MG tablet Take 1 tablet (40 mg total) by mouth at bedtime.  . celecoxib (CELEBREX) 200 MG capsule TAKE ONE CAPSULE BY MOUTH EVERY DAY FOR HIP PAIN. DO NOT TAKE IBUPROFEN WHEN ON THIS  . FLUoxetine (PROZAC) 40 MG capsule Take 1 capsule (40 mg total) by mouth daily.  . fluticasone (FLONASE) 50 MCG/ACT nasal spray Place 1 spray into both nostrils daily as needed for allergies or  rhinitis.  Marland Kitchen glipiZIDE (GLUCOTROL) 10 MG tablet Take 10 mg by mouth 2 (two) times daily before a meal.   . insulin glargine (LANTUS) 100 UNIT/ML injection Inject 30 Units into the skin at bedtime. Takes 10units in AM  . levETIRAcetam (KEPPRA) 750 MG tablet Take 2 tablets (1,500 mg total) by mouth 2 (two) times daily.  Marland Kitchen lisinopril (PRINIVIL,ZESTRIL) 2.5 MG tablet Take 2.5 mg by mouth daily.  Marland Kitchen LYRICA 150 MG capsule TAKE ONE CAPSULE BY MOUTH TWICE A DAY FOR PAIN  . meclizine (ANTIVERT) 25 MG tablet Take 1 tablet (25 mg total) by mouth 3 (three) times  daily as needed for dizziness.  . metFORMIN (GLUCOPHAGE-XR) 500 MG 24 hr tablet Take 2 tablets (1,000 mg total) by mouth 2 (two) times daily.  . metoprolol succinate (TOPROL-XL) 50 MG 24 hr tablet Take 1 tablet (50 mg total) by mouth daily. Take with or immediately following a meal.  . Multiple Vitamins-Minerals (MULTIVITAMIN WITH MINERALS) tablet Take 1 tablet by mouth daily.   No facility-administered encounter medications on file as of 12/13/2017.   :  Review of Systems:  Out of a complete 14 point review of systems, all are reviewed and negative with the exception of these symptoms as listed below: Review of Systems  Neurological:       Pt presents today to discuss his sleep. Pt reports having completed an HST in the past. Pt does endorse snoring.  Epworth Sleepiness Scale 0= would never doze 1= slight chance of dozing 2= moderate chance of dozing 3= high chance of dozing  Sitting and reading: 2 Watching TV: 3 Sitting inactive in a public place (ex. Theater or meeting): 2 As a passenger in a car for an hour without a break: 3 Lying down to rest in the afternoon: 3 Sitting and talking to someone: 1 Sitting quietly after lunch (no alcohol): 2 In a car, while stopped in traffic: 3 Total: 19     Objective:  Neurological Exam  Physical Exam Physical Examination:   Vitals:   12/13/17 1555  BP: 107/73  Pulse: 90    General  Examination: The patient is a very pleasant 47 y.o. male in no acute distress. He appears well-developed and well-nourished and adequately groomed.   HEENT: Normocephalic, atraumatic, pupils are equal, L eye blindness, visually impaired on the R, thick eye glasses. Funduscopic exam is normal with sharp disc margins noted. Extraocular tracking is good without limitation to gaze excursion or nystagmus noted. Normal smooth pursuit is noted. Hearing is grossly intact. Face is symmetric with normal facial animation and normal facial sensation. Speech is clear with no dysarthria noted. There is no hypophonia. There is no lip, neck/head, jaw or voice tremor. Neck is supple with full range of passive and active motion. There are no carotid bruits on auscultation. Oropharynx exam reveals: moderate mouth dryness, adequate dental hygiene with full dentures in place and moderate airway crowding, due to thicker tongue, thicker soft palate, smaller airway entry, tonsils of 1+. Mallampati is class III. Tongue protrudes centrally and palate elevates symmetrically. Neck size is 18.25 inches.   Chest: Clear to auscultation without wheezing, rhonchi or crackles noted.  Heart: S1+S2+0, regular and normal without murmurs, rubs or gallops noted.   Abdomen: Soft, non-tender and non-distended with normal bowel sounds appreciated on auscultation.  Extremities: There is no pitting edema in the distal lower extremities bilaterally. Pedal pulses are intact.  Skin: Warm and dry without trophic changes noted.  Musculoskeletal: exam reveals no obvious joint deformities, tenderness or joint swelling or erythema.   Neurologically:  Mental status: The patient is awake, alert and oriented in all 4 spheres. His immediate and remote memory, attention, language skills and fund of knowledge are appropriate. There is no evidence of aphasia, agnosia, apraxia or anomia. Speech is clear with normal prosody and enunciation. Thought process is  linear. Mood is normal and affect is normal.  Cranial nerves II - XII are as described above under HEENT exam. In addition: shoulder shrug is normal with equal shoulder height noted. Motor exam: Normal bulk, strength and tone is noted, R hip flexor weakness. There  is no drift, tremor or rebound. Reflexes are 1+ in the UEs and absent in the LEs. Fine motor skills and coordination: grossly intact.  Cerebellar testing: No dysmetria or intention tremor.  Sensory exam: intact to light touch in the upper and lower extremities.  Gait, station and balance: He stands easily. No veering to one side is noted. No leaning to one side is noted. Posture is age-appropriate and stance is narrow based. Gait shows normal stride length and normal pace. No problems turning are noted. Walks with a single point cane.   Assessment and Plan:   In summary, Kaeson Kleinert. is a 47 y.o. old male  with an underlying medical history of stroke, seizure disorder, neuropathy, diabetes, depression, asthma, anxiety, vision loss, hyperlipidemia, and obesity, whose history and physical exam are concerning for obstructive sleep apnea (OSA). I had a long chat with the patient and his wife about my findings and the diagnosis of OSA, its prognosis and treatment options. We talked about medical treatments, surgical interventions and non-pharmacological approaches. I explained in particular the risks and ramifications of untreated moderate to severe OSA, especially with respect to developing cardiovascular disease down the Road, including congestive heart failure, difficult to treat hypertension, cardiac arrhythmias, or stroke. Even type 2 diabetes has, in part, been linked to untreated OSA. Symptoms of untreated OSA include daytime sleepiness, memory problems, mood irritability and mood disorder such as depression and anxiety, lack of energy, as well as recurrent headaches, especially morning headaches. We talked about smoking cessation and  trying to maintain a healthy lifestyle in general, as well as the importance of weight control. I encouraged the patient to eat healthy, exercise daily and keep well hydrated, to keep a scheduled bedtime and wake time routine, to not skip any meals and eat healthy snacks in between meals. I advised the patient not to drive when feeling sleepy.   I recommended the following at this time: sleep study with potential positive airway pressure titration. (We will score hypopneas at 4%).   I explained the sleep test procedure to the patient and also outlined possible surgical and non-surgical treatment options of OSA, including the use of a custom-made dental device (which would require a referral to a specialist dentist or oral surgeon), upper airway surgical options, such as pillar implants, radiofrequency surgery, tongue base surgery, and UPPP (which would involve a referral to an ENT surgeon). Rarely, jaw surgery such as mandibular advancement may be considered.  I also explained the CPAP treatment option to the patient, who indicated that he would be willing to try CPAP if the need arises. I explained the importance of being compliant with PAP treatment, not only for insurance purposes but primarily to improve His symptoms, and for the patient's long term health benefit, including to reduce His cardiovascular risks. I answered all their questions today and the patient and his wife were in agreement. I plan to see him back after the sleep study is completed and encouraged him to call with any interim questions, concerns, problems or updates.   Thank you very much for allowing me to participate in the care of this nice patient. If I can be of any further assistance to you please do not hesitate to talk to me.  Sincerely,   Huston Foley, MD, PhD

## 2018-01-16 ENCOUNTER — Ambulatory Visit: Payer: Medicare Other | Admitting: Nurse Practitioner

## 2018-01-24 ENCOUNTER — Ambulatory Visit (INDEPENDENT_AMBULATORY_CARE_PROVIDER_SITE_OTHER): Payer: Medicaid Other | Admitting: Neurology

## 2018-01-24 DIAGNOSIS — G40909 Epilepsy, unspecified, not intractable, without status epilepticus: Secondary | ICD-10-CM

## 2018-01-24 DIAGNOSIS — R400 Somnolence: Secondary | ICD-10-CM

## 2018-01-24 DIAGNOSIS — H5410 Blindness, one eye, low vision other eye, unspecified eyes: Secondary | ICD-10-CM

## 2018-01-24 DIAGNOSIS — G4733 Obstructive sleep apnea (adult) (pediatric): Secondary | ICD-10-CM

## 2018-01-24 DIAGNOSIS — Z8673 Personal history of transient ischemic attack (TIA), and cerebral infarction without residual deficits: Secondary | ICD-10-CM

## 2018-01-24 DIAGNOSIS — R0683 Snoring: Secondary | ICD-10-CM

## 2018-01-24 DIAGNOSIS — E669 Obesity, unspecified: Secondary | ICD-10-CM

## 2018-01-24 DIAGNOSIS — G4720 Circadian rhythm sleep disorder, unspecified type: Secondary | ICD-10-CM

## 2018-01-30 ENCOUNTER — Encounter: Payer: Medicare Other | Admitting: Neurology

## 2018-01-30 ENCOUNTER — Telehealth: Payer: Self-pay

## 2018-01-30 NOTE — Procedures (Signed)
PATIENT'S NAME:  Miguel Diaz, Miguel DOB:      1970/10/03      MR#:    [id number]     DATE OF RECORDING: 01/24/2018 REFERRING M.D.:  Darrol Angelarolyn Martin, NP Study Performed:   Baseline Polysomnogram HISTORY: 47 year old man with a history of stroke, seizure disorder, neuropathy, diabetes, depression, asthma, anxiety, vision loss, hyperlipidemia, and obesity, who reports snoring and excessive daytime somnolence. The patient endorsed the Epworth Sleepiness Scale at 19 points. The patient's weight 209 pounds with a height of 68 (inches), resulting in a BMI of 31.7 kg/m2. The patient's neck circumference measured 18.25 inches.  CURRENT MEDICATIONS: Proventil, Aspirin, Lipitor, Celebrex, Prozac, Flonase, Glipizide, Lantus, Keppra, Lisinopril, Lyrica, Antivert, Metformin, Toprol   PROCEDURE:  This is a multichannel digital polysomnogram utilizing the Somnostar 11.2 system.  Electrodes and sensors were applied and monitored per AASM Specifications.   EEG, EOG, Chin and Limb EMG, were sampled at 200 Hz.  ECG, Snore and Nasal Pressure, Thermal Airflow, Respiratory Effort, CPAP Flow and Pressure, Oximetry was sampled at 50 Hz. Digital video and audio were recorded.      BASELINE STUDY  Lights Out was at 22:53 and Lights On at 05:00.  Total recording time (TRT) was 367 minutes, with a total sleep time (TST) of  282 minutes.   The patient's sleep latency was 27 minutes.  REM latency was 147.5 minutes, which is delayed. The sleep efficiency was 76.8%, which is reduced.     SLEEP ARCHITECTURE: WASO (Wake after sleep onset) was 74.5 minutes with severe sleep fragmentation noted.  There were 43.5 minutes in Stage N1, 181.5 minutes Stage N2, 39 minutes Stage N3 and 18 minutes in Stage REM.  The percentage of Stage N1 was 15.4%, which is increased, Stage N2 was 64.4%, which is increased, Stage N3 was 13.8% and Stage R (REM sleep) was 6.4%, which is reduced. The arousals were noted as: 37 were spontaneous, 1 were associated with  PLMs, 15 were associated with respiratory events.  RESPIRATORY ANALYSIS:  There were a total of 96 respiratory events:  4 obstructive apneas, 0 central apneas and 0 mixed apneas with a total of 4 apneas and an apnea index (AI) of .9 /hour. There were 92 hypopneas with a hypopnea index of 19.6 /hour. The patient also had 0 respiratory event related arousals (RERAs).      The total APNEA/HYPOPNEA INDEX (AHI) was 20.4/hour and the total RESPIRATORY DISTURBANCE INDEX was 0. 20.4 /hour.  14 events occurred in REM sleep and 156 events in NREM. The REM AHI was 0. 46.7 /hour, versus a non-REM AHI of 18.6. The patient spent 78.5 minutes of total sleep time in the supine position and 204 minutes in non-supine.. The supine AHI was 45.1 versus a non-supine AHI of 10.9.  OXYGEN SATURATION & C02:  The Wake baseline 02 saturation was 92%, with the lowest being 86%. Time spent below 89% saturation equaled 10 minutes.    PERIODIC LIMB MOVEMENTS: The patient had a total of 16 Periodic Limb Movements.  The Periodic Limb Movement (PLM) index was 3.4 and the PLM Arousal index was .2/hour.  Audio and video analysis did not show any abnormal or unusual movements, behaviors, phonations or vocalizations. The patient took no bathroom breaks. Moderate snoring was noted. The EKG was in keeping with normal sinus rhythm (NSR).  Post-study, the patient indicated that sleep was the same as usual.   IMPRESSION:  1. Obstructive Sleep Apnea (OSA) 2. Dysfunctions associated with sleep stages or arousal from  sleep  RECOMMENDATIONS:  1. This study demonstrates moderate to severe obstructive sleep apnea, with a total AHI of 20.4/hour, REM AHI of 46.7/hour, supine AHI of 45.1/hour and O2 nadir of 86%. Treatment with positive airway pressure in the form of CPAP is recommended. This will require a full night titration study to optimize therapy. Other treatment options may include avoidance of supine sleep position along with weight  loss, upper airway or jaw surgery in selected patients or the use of an oral appliance in certain patients. ENT evaluation and/or consultation with a maxillofacial surgeon or dentist may be feasible in some instances. Please note that untreated obstructive sleep apnea may carry additional perioperative morbidity. Patients with significant obstructive sleep apnea should receive perioperative PAP therapy and the surgeons and particularly the anesthesiologist should be informed of the diagnosis and the severity of the sleep disordered breathing. 2. This study shows severe sleep fragmentation and abnormal sleep stage percentages; these are nonspecific findings and per se do not signify an intrinsic sleep disorder or a cause for the patient's sleep-related symptoms. Causes include (but are not limited to) the first night effect of the sleep study, circadian rhythm disturbances, medication effect or an underlying mood disorder or medical problem.  3. The patient should be cautioned not to drive, work at heights, or operate dangerous or heavy equipment when tired or sleepy. Review and reiteration of good sleep hygiene measures should be pursued with any patient.  I certify that I have reviewed the entire raw data recording prior to the issuance of this report in accordance with the Standards of Accreditation of the American Academy of Sleep Medicine (AASM)   Huston Foley, MD, PhD Diplomat, American Board of Neurology and Sleep Medicine (Neurology and Sleep Medicine)

## 2018-01-30 NOTE — Telephone Encounter (Signed)
-----   Message from Huston FoleySaima Athar, MD sent at 01/30/2018  7:55 AM EDT ----- Patient referred by CM, seen by me on 12/13/17, diagnostic PSG on 01/24/18.   Please call and notify the patient that the recent sleep study showed moderate to severe obstructive sleep apnea. I recommend treatment for this in the form of CPAP. This will require a repeat sleep study for proper titration and mask fitting and correct monitoring of the oxygen saturations. Please explain to patient. I have placed an order in the chart. Thanks.  Huston FoleySaima Athar, MD, PhD [facility])

## 2018-01-30 NOTE — Progress Notes (Signed)
Patient referred by CM, seen by me on 12/13/17, diagnostic PSG on 01/24/18.   Please call and notify the patient that the recent sleep study showed moderate to severe obstructive sleep apnea. I recommend treatment for this in the form of CPAP. This will require a repeat sleep study for proper titration and mask fitting and correct monitoring of the oxygen saturations. Please explain to patient. I have placed an order in the chart. Thanks.  Huston FoleySaima Xochilth Standish, MD, PhD [facility])

## 2018-01-30 NOTE — Telephone Encounter (Signed)

## 2018-01-30 NOTE — Addendum Note (Signed)
Addended by: Huston FoleyATHAR, Hudsyn Barich on: 01/30/2018 07:55 AM   Modules accepted: Orders

## 2018-02-09 ENCOUNTER — Ambulatory Visit (INDEPENDENT_AMBULATORY_CARE_PROVIDER_SITE_OTHER): Payer: Medicare Other | Admitting: Neurology

## 2018-02-09 DIAGNOSIS — G4733 Obstructive sleep apnea (adult) (pediatric): Secondary | ICD-10-CM

## 2018-02-09 DIAGNOSIS — R400 Somnolence: Secondary | ICD-10-CM

## 2018-02-09 DIAGNOSIS — Z8673 Personal history of transient ischemic attack (TIA), and cerebral infarction without residual deficits: Secondary | ICD-10-CM

## 2018-02-09 DIAGNOSIS — G40909 Epilepsy, unspecified, not intractable, without status epilepticus: Secondary | ICD-10-CM

## 2018-02-09 DIAGNOSIS — E669 Obesity, unspecified: Secondary | ICD-10-CM

## 2018-02-09 DIAGNOSIS — G4720 Circadian rhythm sleep disorder, unspecified type: Secondary | ICD-10-CM

## 2018-02-10 NOTE — Progress Notes (Signed)
Patient referred by CM, seen by me on 12/13/17, diagnostic PSG on 01/24/18. Patient had a CPAP titration study on 02/09/18.  Please call and inform patient that I have entered an order for treatment with positive airway pressure (PAP) treatment for obstructive sleep apnea (OSA). He did well during the latest sleep study with CPAP. We will, therefore, arrange for a machine for home use through a DME (durable medical equipment) company of His choice; and I will see the patient back in follow-up in about 10 weeks. Please also explain to the patient that I will be looking out for compliance data, which can be downloaded from the machine (stored on an SD card, that is inserted in the machine) or via remote access through a modem, that is built into the machine. At the time of the followup appointment we will discuss sleep study results and how it is going with PAP treatment at home. Please advise patient to bring His machine at the time of the first FU visit, even though this is cumbersome. Bringing the machine for every visit after that will likely not be needed, but often helps for the first visit to troubleshoot if needed. Please re-enforce the importance of compliance with treatment and the need for us to monitor compliance data - often an insurance requirement and actually good feedback for the patient as far as how they are doing.  Also remind patient, that any interim PAP machine or mask issues should be first addressed with the DME company, as they can often help better with technical and mask fit issues. Please ask if patient has a preference regarding DME company.  Please also make sure, the patient has a follow-up appointment with me in about 10 weeks from the setup date, thanks. May see one of our nurse practitioners if needed for proper timing of the FU appointment.  Please fax or rout report to the referring provider. Thanks,   Huston FoleySaima Alycen Mack, MD, PhD [facility]

## 2018-02-10 NOTE — Procedures (Signed)
PATIENT'S NAME:  Miguel Diaz, Miguel Diaz DOB:      May 31, 1971      MR#:    [id number]     DATE OF RECORDING: 02/09/2018 REFERRING M.D.:  Darrol Angelarolyn Martin, NP Study Performed:   CPAP  Titration HISTORY: 47 year old man with a history of stroke, seizure disorder, neuropathy, diabetes, depression, asthma, anxiety, vision loss, hyperlipidemia, and obesity, who returns for a CPAP titration study to treat his OSA. His PSG from 01/24/2018 showed an AHI of 20.4 and low spo2 of 86%. The patient endorsed the Epworth Sleepiness Scale at 19 points. BMI of 31.7 kg/m2. The patient's neck circumference measured 18.25 inches.  CURRENT MEDICATIONS: Proventil, Aspirin, Lipitor, Celebrex, Prozac, Flonase, Glipizide, Lantus, Keppra, Lisinopril, Lyrica, Antivert, Metformin, Toprol  PROCEDURE:  This is a multichannel digital polysomnogram utilizing the SomnoStar 11.2 system.  Electrodes and sensors were applied and monitored per AASM Specifications.   EEG, EOG, Chin and Limb EMG, were sampled at 200 Hz.  ECG, Snore and Nasal Pressure, Thermal Airflow, Respiratory Effort, CPAP Flow and Pressure, Oximetry was sampled at 50 Hz. Digital video and audio were recorded.      The patient was fitted with a medium Dreamwear FFM. CPAP was initiated at 5 cmH20 with heated humidity per AASM standards and pressure was advanced to 9 cmH20 because of hypopneas, apneas and desaturations.  At a PAP pressure of 9 cmH20, there was a reduction of the AHI to 3.5/hour, with supine REM sleep achieved and O2 nadir of 80% briefly.   Lights Out was at 21:18 and Lights On at 05:03. Total recording time (TRT) was 465.5 minutes, with a total sleep time (TST) of 383.5 minutes. The patient's sleep latency was 11.5 minutes. REM latency was 208 minutes which is delayed. The sleep efficiency was 82.4 %.    SLEEP ARCHITECTURE: WASO (Wake after sleep onset) was 71.5 minutes with mild to moderate sleep fragmentation noted. There were 17 minutes in Stage N1, 132.5 minutes  Stage N2, 122 minutes Stage N3 and 112 minutes in Stage REM.  The percentage of Stage N1 was 4.4%, Stage N2 was 34.6%, Stage N3 was 31.8%, which is increased, and Stage R (REM sleep) was 29.2%, which is increased and in keeping with rebound. The arousals were noted as: 23 were spontaneous, 0 were associated with PLMs, 0 were associated with respiratory events.  RESPIRATORY ANALYSIS:  There was a total of 2 respiratory events: 0 obstructive apneas, 2 central apneas and 0 mixed apneas with a total of 2 apneas and an apnea index (AI) of .3 /hour. There were 0 hypopneas with a hypopnea index of 0/hour. The patient also had 0 respiratory event related arousals (RERAs).      The total APNEA/HYPOPNEA INDEX  (AHI) was .30. /hour and the total RESPIRATORY DISTURBANCE INDEX was .3 0./hour  2 events occurred in REM sleep and 0 events in NREM. The REM AHI was 1.1 /hour versus a non-REM AHI of 0 0./hour.  The patient spent 64 minutes of total sleep time in the supine position and 320 minutes in non-supine. The supine AHI was 1.9, versus a non-supine AHI of 0.0.  OXYGEN SATURATION & C02:  The baseline 02 saturation was 95%, with the lowest being 80%. Time spent below 89% saturation equaled 4 minutes.  PERIODIC LIMB MOVEMENTS: The patient had a total of 0 Periodic Limb Movements. The Periodic Limb Movement (PLM) index was 0 and the PLM Arousal index was 0 /hour.   Audio and video analysis did not show any  abnormal or unusual movements, behaviors, phonations or vocalizations. The patient took 1 bathroom break. The EKG was in keeping with normal sinus rhythm (NSR).  Post-study, the patient indicated that sleep was better than usual.   IMPRESSION:   1. Obstructive Sleep Apnea (OSA) 2. Dysfunctions associated with sleep stages or arousal from sleep   RECOMMENDATIONS:   1. This study demonstrates significant reduction of the patient's obstructive sleep apnea with CPAP therapy. Due to the brief O2 nadir of 80%  during supine REM sleep on the final titration pressure of 9 cm, I will recommend a home CPAP treatment pressure of 11 cm via medium Dreamwear interface with heated humidity. The patient should be reminded to be fully compliant with PAP therapy to improve sleep related symptoms and decrease long term cardiovascular risks. The patient should be reminded, that it may take up to 3 months to get fully used to using PAP with all planned sleep. The earlier full compliance is achieved, the better long term compliance tends to be. Please note that untreated obstructive sleep apnea may carry additional perioperative morbidity. Patients with significant obstructive sleep apnea should receive perioperative PAP therapy and the surgeons and particularly the anesthesiologist should be informed of the diagnosis and the severity of the sleep disordered breathing. 2. This study shows sleep fragmentation and abnormal sleep stage percentages; these are nonspecific findings and per se do not signify an intrinsic sleep disorder or a cause for the patient's sleep-related symptoms. Causes include (but are not limited to) the first night effect of the sleep study, circadian rhythm disturbances, medication effect or an underlying mood disorder or medical problem.  3. The patient should be cautioned not to drive, work at heights, or operate dangerous or heavy equipment when tired or sleepy. Review and reiteration of good sleep hygiene measures should be pursued with any patient.   I certify that I have reviewed the entire raw data recording prior to the issuance of this report in accordance with the Standards of Accreditation of the American Academy of Sleep Medicine (AASM)     Huston Foley, MD, PhD Diplomat, American Board of Neurology and Sleep Medicine (Neurology and Sleep Medicine)

## 2018-02-10 NOTE — Addendum Note (Signed)
Addended by: Huston FoleyATHAR, Kinberly Perris on: 02/10/2018 12:59 PM   Modules accepted: Orders

## 2018-02-13 ENCOUNTER — Telehealth: Payer: Self-pay

## 2018-02-13 NOTE — Telephone Encounter (Signed)
I called pt. I advised pt that Dr. Frances FurbishAthar reviewed their sleep study results and found that pt pt did well with cpap during the latest sleep study. Dr. Frances FurbishAthar recommends that pt start a cpap at home. I reviewed PAP compliance expectations with the pt. Pt is agreeable to starting a CPAP. I advised pt that an order will be sent to a DME, [facility], and [facility] will call the pt within about one week after they file with the pt's insurance. [facility] will show the pt how to use the machine, fit for masks, and troubleshoot the CPAP if needed. A follow up appt was made for insurance purposes with Dr. Frances FurbishAthar on 05/10/18 at 2:00pm. Pt verbalized understanding to arrive 15 minutes early and bring their CPAP. A letter with all of this information in it will be mailed to the pt as a reminder. I verified with the pt that the address we have on file is correct. Pt verbalized understanding of results. Pt had no questions at this time but was encouraged to call back if questions arise.

## 2018-02-13 NOTE — Telephone Encounter (Signed)
I called pt to discuss his sleep study results. No answer, left a message asking him to call me back. 

## 2018-02-13 NOTE — Telephone Encounter (Signed)
-----   Message from Huston FoleySaima Athar, MD sent at 02/10/2018 12:58 PM EDT ----- Patient referred by CM, seen by me on 12/13/17, diagnostic PSG on 01/24/18. Patient had a CPAP titration study on 02/09/18.  Please call and inform patient that I have entered an order for treatment with positive airway pressure (PAP) treatment for obstructive sleep apnea (OSA). He did well during the latest sleep study with CPAP. We will, therefore, arrange for a machine for home use through a DME (durable medical equipment) company of His choice; and I will see the patient back in follow-up in about 10 weeks. Please also explain to the patient that I will be looking out for compliance data, which can be downloaded from the machine (stored on an SD card, that is inserted in the machine) or via remote access through a modem, that is built into the machine. At the time of the followup appointment we will discuss sleep study results and how it is going with PAP treatment at home. Please advise patient to bring His machine at the time of the first FU visit, even though this is cumbersome. Bringing the machine for every visit after that will likely not be needed, but often helps for the first visit to troubleshoot if needed. Please re-enforce the importance of compliance with treatment and the need for us to monitor compliance data - often an insurance requirement and actually good feedback for the patient as far as how they are doing.  Also remind patient, that any interim PAP machine or mask issues should be first addressed with the DME company, as they can often help better with technical and mask fit issues. Please ask if patient has a preference regarding DME company.  Please also make sure, the patient has a follow-up appointment with me in about 10 weeks from the setup date, thanks. May see one of our nurse practitioners if needed for proper timing of the FU appointment.  Please fax or rout report to the referring provider. Thanks,    Huston FoleySaima Athar, MD, PhD [facility])

## 2018-02-21 NOTE — Telephone Encounter (Signed)
Pt contacted [facility] yesterday and they are not showing any orders. Please call the pt to advise

## 2018-02-21 NOTE — Telephone Encounter (Signed)
I have a fax confirmation that [facility] received this pt's cpap orders on 02/13/18. I have contacted [facility] to reach to the pt to discuss.

## 2018-02-21 NOTE — Telephone Encounter (Signed)
I called pt, advised him that [facility] does have his orders. Pt will call [facility] again to discuss.

## 2018-02-22 NOTE — Telephone Encounter (Signed)
Received this notice from [facility]: "patient is aware we have his order and it will be finalized soon to schedule."

## 2018-03-03 ENCOUNTER — Ambulatory Visit: Payer: Medicare Other | Admitting: Cardiovascular Disease

## 2018-05-03 ENCOUNTER — Ambulatory Visit (INDEPENDENT_AMBULATORY_CARE_PROVIDER_SITE_OTHER): Payer: Medicare Other | Admitting: Cardiovascular Disease

## 2018-05-03 ENCOUNTER — Encounter: Payer: Self-pay | Admitting: Cardiovascular Disease

## 2018-05-03 VITALS — BP 94/68 | HR 86 | Ht 68.0 in | Wt 215.0 lb

## 2018-05-03 DIAGNOSIS — Z720 Tobacco use: Secondary | ICD-10-CM | POA: Diagnosis not present

## 2018-05-03 DIAGNOSIS — E782 Mixed hyperlipidemia: Secondary | ICD-10-CM

## 2018-05-03 DIAGNOSIS — E1142 Type 2 diabetes mellitus with diabetic polyneuropathy: Secondary | ICD-10-CM

## 2018-05-03 DIAGNOSIS — E1169 Type 2 diabetes mellitus with other specified complication: Secondary | ICD-10-CM | POA: Diagnosis not present

## 2018-05-03 DIAGNOSIS — Z9989 Dependence on other enabling machines and devices: Secondary | ICD-10-CM

## 2018-05-03 DIAGNOSIS — I25118 Atherosclerotic heart disease of native coronary artery with other forms of angina pectoris: Secondary | ICD-10-CM | POA: Diagnosis not present

## 2018-05-03 DIAGNOSIS — G4733 Obstructive sleep apnea (adult) (pediatric): Secondary | ICD-10-CM

## 2018-05-03 NOTE — Patient Instructions (Signed)

## 2018-05-03 NOTE — Progress Notes (Signed)
SUBJECTIVE: The patient presents to establish care with me in our [place] office.  He apparently has a history of extensive coronary artery disease without hemodynamically significant stenoses by coronary CT angiography performed in May 2018. He presented at that time with atypical chest discomfort and coronary CTA showed a calcium score at 94th percentile for age/gender, 50-69% stenosis in the mid LAD and scattered lesions throughout all 3 major coronary arteries.  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  He has a long history of tobacco abuse and smokes about a pack of cigarettes daily.  He denies any significant exertional chest pain and shortness of breath.  He seldom has palpitations.    He told me he has a history of a minor stroke in 2015.  He continues to smoke 1 pack of cigarettes daily.      Review of Systems: As per "subjective", otherwise negative.  Allergies  Allergen Reactions  . Penicillins Other (See Comments)    Unknown childhood reaction Has patient had a PCN reaction causing immediate rash, facial/tongue/throat swelling, SOB or lightheadedness with hypotension: Unknown Has patient had a PCN reaction causing severe rash involving mucus membranes or skin necrosis: Unknown Has patient had a PCN reaction that required hospitalization: Unknown Has patient had a PCN reaction occurring within the last 10 years: Unknown If all of the above answers are "NO", then may proceed with Cephalosporin use.   . Toradol [Ketorolac Tromethamine] Hives    Current Outpatient Medications  Medication Sig Dispense Refill  . albuterol (PROVENTIL HFA;VENTOLIN HFA) 108 (90 Base) MCG/ACT inhaler Inhale 2 puffs into the lungs every 6 (six) hours as needed for wheezing or shortness of breath. 2 Inhaler 0  . aspirin 325 MG tablet Take 1 tablet (325 mg total) by mouth daily. 30 tablet 0  . atorvastatin (LIPITOR) 40 MG tablet Take 1 tablet (40 mg  total) by mouth at bedtime. 30 tablet 0  . celecoxib (CELEBREX) 200 MG capsule TAKE ONE CAPSULE BY MOUTH EVERY DAY FOR HIP PAIN. DO NOT TAKE IBUPROFEN WHEN ON THIS  2  . FLUoxetine (PROZAC) 40 MG capsule Take 1 capsule (40 mg total) by mouth daily. 30 capsule 0  . fluticasone (FLONASE) 50 MCG/ACT nasal spray Place 1 spray into both nostrils daily as needed for allergies or rhinitis. 15.8 g 0  . glipiZIDE (GLUCOTROL) 10 MG tablet Take 10 mg by mouth 2 (two) times daily before a meal.   0  . insulin glargine (LANTUS) 100 UNIT/ML injection Inject 30 Units into the skin at bedtime. Takes 15 units in AM    . levETIRAcetam (KEPPRA) 750 MG tablet Take 2 tablets (1,500 mg total) by mouth 2 (two) times daily. 360 tablet 2  . lisinopril (PRINIVIL,ZESTRIL) 2.5 MG tablet Take 2.5 mg by mouth daily.  5  . LYRICA 150 MG capsule TAKE ONE CAPSULE BY MOUTH TWICE A DAY FOR PAIN  0  . meclizine (ANTIVERT) 25 MG tablet Take 1 tablet (25 mg total) by mouth 3 (three) times daily as needed for dizziness. 30 tablet 0  . metFORMIN (GLUCOPHAGE-XR) 500 MG 24 hr tablet Take 2 tablets (1,000 mg total) by mouth 2 (two) times daily. 120 tablet 0  . metoprolol succinate (TOPROL-XL) 50 MG 24 hr tablet Take 1 tablet (50 mg total) by mouth daily. Take with or immediately following a meal. 30 tablet 0  . Multiple Vitamins-Minerals (MULTIVITAMIN WITH MINERALS) tablet Take 1  tablet by mouth daily.     No current facility-administered medications for this visit.     Past Medical History:  Diagnosis Date  . Anxiety   . Asthma   . Blindness    bilateral  . Depression   . Diabetes mellitus without complication (HCC)   . Hypercholesteremia   . Neuropathy   . PSVT (paroxysmal supraventricular tachycardia) (HCC) 06/2015  . Seizure (HCC)   . Stroke (HCC) 02/2014   right sided weakness/numbness  . Vision loss of left eye     Past Surgical History:  Procedure Laterality Date  . ABDOMINAL SURGERY    . EYE SURGERY Left   .  HERNIA REPAIR      Social History   Socioeconomic History  . Marital status: Significant Other    Spouse name: Not on file  . Number of children: 2  . Years of education: 9th  . Highest education level: Not on file  Occupational History  . Occupation: disabled  Social Needs  . Financial resource strain: Not on file  . Food insecurity:    Worry: Not on file    Inability: Not on file  . Transportation needs:    Medical: Not on file    Non-medical: Not on file  Tobacco Use  . Smoking status: Current Every Day Smoker    Packs/day: 1.00    Types: Cigarettes  . Smokeless tobacco: Never Used  Substance and Sexual Activity  . Alcohol use: Not Currently    Alcohol/week: 0.0 standard drinks  . Drug use: No  . Sexual activity: Not Currently  Lifestyle  . Physical activity:    Days per week: Not on file    Minutes per session: Not on file  . Stress: Not on file  Relationships  . Social connections:    Talks on phone: Not on file    Gets together: Not on file    Attends religious service: Not on file    Active member of club or organization: Not on file    Attends meetings of clubs or organizations: Not on file    Relationship status: Not on file  . Intimate partner violence:    Fear of current or ex partner: Not on file    Emotionally abused: Not on file    Physically abused: Not on file    Forced sexual activity: Not on file  Other Topics Concern  . Not on file  Social History Narrative    Merged History Encounter    Patient  Lives with his girl friend   Patient is right handed   Patient coffee daily         Vitals:   05/03/18 1407  BP: 94/68  Pulse: 86  SpO2: 94%  Weight: 215 lb (97.5 kg)  Height: 5\' 8"  (1.727 m)    Wt Readings from Last 3 Encounters:  05/03/18 215 lb (97.5 kg)  12/13/17 209 lb (94.8 kg)  12/07/17 206 lb 12.8 oz (93.8 kg)     PHYSICAL EXAM General: NAD HEENT: Normal. Neck: No JVD, no thyromegaly. Lungs: Clear to auscultation  bilaterally with normal respiratory effort. CV: Regular rate and rhythm, normal S1/S2, no S3/S4, no murmur. No pretibial or periankle edema.  No carotid bruit.   Abdomen: Soft, nontender, no distention.  Neurologic: Alert and oriented.  Psych: Normal affect. Skin: Normal. Musculoskeletal: No gross deformities.    ECG: Reviewed above under Subjective   Labs: Lab Results  Component Value Date/Time   K 3.2 (  L) 12/26/2016 04:01 AM   BUN 7 12/26/2016 04:01 AM   CREATININE 0.65 12/26/2016 04:01 AM   ALT 21 12/24/2016 01:52 PM   TSH 1.213 12/14/2015 11:55 PM   TSH 1.620 01/27/2014 03:17 AM   HGB 14.1 12/26/2016 04:01 AM     Lipids: Lab Results  Component Value Date/Time   LDLCALC 194 (H) 11/15/2016 06:39 AM   CHOL 303 (H) 11/15/2016 06:39 AM   TRIG 385 (H) 11/15/2016 06:39 AM   HDL 32 (L) 11/15/2016 06:39 AM       ASSESSMENT AND PLAN:  1.  Coronary artery disease: Symptomatically stable.  At very high risk of progression and adverse cardiac events. No ischemia by nuclear stress test June 2017, no high-grade stenosis by coronary CT angiography May 2018. On aspirin and metoprolol succinate.  2.  Hyperlipidemia: LDL markedly elevated at 194 on 11/15/2016.  I will request a copy of most recent lipids from his PCP.  For the time being continue atorvastatin 40 mg.  3.  Type 2 diabetes mellitus: Currently on metformin, Lantus, and glipizide.  I would recommend an SGLT2 inhibitor given his significantly elevated cardiac risk.  He is on atorvastatin 40 mg.  4.  Tobacco abuse: Not a good candidate for Wellbutrin or Chantix given a history of seizures.  Nicotine replacement products can be utilized.  It appears he has no desire to quit.  5.  Obstructive sleep apnea: Uses CPAP.  6.  History of TIA: On aspirin and atorvastatin.   Disposition: Follow up 1 year   Prentice Docker, M.D., F.A.C.C.

## 2018-05-04 ENCOUNTER — Encounter: Payer: Self-pay | Admitting: *Deleted

## 2018-05-08 ENCOUNTER — Encounter: Payer: Self-pay | Admitting: Neurology

## 2018-05-10 ENCOUNTER — Ambulatory Visit: Payer: Medicare Other | Admitting: Neurology

## 2018-05-10 ENCOUNTER — Encounter: Payer: Self-pay | Admitting: Neurology

## 2018-05-10 VITALS — BP 108/70 | HR 76 | Ht 68.0 in | Wt 215.0 lb

## 2018-05-10 DIAGNOSIS — Z9989 Dependence on other enabling machines and devices: Secondary | ICD-10-CM | POA: Diagnosis not present

## 2018-05-10 DIAGNOSIS — G4733 Obstructive sleep apnea (adult) (pediatric): Secondary | ICD-10-CM

## 2018-05-10 NOTE — Progress Notes (Signed)
Subjective:    Patient ID: Miguel Diaz. is a 47 y.o. male.  HPI    Interim history:   Miguel Diaz is a 47 year old right-handed gentleman with an underlying medical history of stroke, seizure disorder, neuropathy, diabetes, depression, asthma, anxiety, vision loss, hyperlipidemia, and obesity, who presents for follow-up consultation of his obstructive sleep apnea, after recent sleep study testing and starting CPAP therapy. The patient is accompanied by his wife today. I first met him on 12/13/17 at the request on [name], NP, at which time he reported snoring and daytime somnolence as well as witnessed apneas. He was advised to proceed with a sleep study. He had a baseline sleep study, followed by a CPAP titration study. I went over his test results with him in detail today. Baseline sleep study from 01/24/2018 showed a sleep efficiency of 76.8%, sleep latency of 27 minutes and REM latency delayed at 147.5 minutes. He had a reduced percentage of REM sleep and an increased percentage of light stage sleep. Total AHI was 20.4 per hour, REM AHI was 46.7 per hour, supine AHI 45.1 per hour. Average oxygen saturation was 92%, nadir was 86%. He had no significant PLMS. Based on his test results he was advised to proceed with a second sleep study for CPAP titration. He had this on 02/09/2018. Sleep efficiency was 82.4%, sleep latency 11.5 minutes, REM latency markedly delayed at 208 minutes. He was titrated via full face mask from 5 cm to 9 cm. On the final pressure his AHI was 3.5 per hour with O2 nadir of 80% briefly. I suggested he proceed with CPAP therapy at home with a treatment pressure of 11 cm. Today, 05/10/2018: I reviewed his CPAP compliance data from 04/09/2018 through 05/08/2018 which is a total of 30 days, during which time he used his CPAP every night with percent used days greater than 4 hours at 97%, indicating excellent compliance with an average usage of 7 hours and 3 minutes,  residual AHI at goal at 2.8 per hour, leak acceptable with the 95th percentile at 13.6 L/m on a pressure of 11 cm with EPR of 3. He reports that he is still adjusting to treatment. He does not really like the full facemask he has been on. He would like to try nasal mask. He does endorse improvement in his daytime somnolence and his wife reports that he is less sleepy during the day. He is willing to continue with treatment.  The patient's allergies, current medications, family history, past medical history, past social history, past surgical history and problem list were reviewed and updated as appropriate.   Previously:  12/13/2017: (He) reports snoring and excessive daytime somnolence. He has witnessed apneas, per wife's report. I reviewed your office note from 12/07/2017. He had a home sleep test many years ago but results are unknown. His Epworth sleepiness score is 19 out of 24, fatigue score is 59 out of 63.  He reports a family history of epilepsy. 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 He had reduced his smoking but is now back to one pack per day. He does not drink alcohol on a regular basis, last time he tried drinking some beer it tasted badly. He has diabetic neuropathy. 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 He denies nighttime nocturia or morning headaches. He does not drive. His wife has sleep apnea and uses CPAP successfully. He would be willing to consider CPAP therapy. Bedtime  is around midnight and rises type varies, he has significant sleep disruption and gets out of bed around 10 or 11. He drinks caffeine in the form of half decaf coffee, 1 pot per day, he does not typically drink a whole lot of water he admits. He has some residual right-sided weakness from his stroke. He and his wife had to move in with her sister. There are 2 dogs in the household, mother also lives there.  His Past Medical History Is Significant For: Past Medical History:   Diagnosis Date  . Anxiety   . Asthma   . Blindness    bilateral  . Depression   . Diabetes mellitus without complication ([place])   . Hypercholesteremia   . Neuropathy   . PSVT (paroxysmal supraventricular tachycardia) ([place]) 06/2015  . Seizure ([place])   . Stroke ([place]) 02/2014   right sided weakness/numbness  . Vision loss of left eye     His Past Surgical History Is Significant For: Past Surgical History:  Procedure Laterality Date  . ABDOMINAL SURGERY    . EYE SURGERY Left   . HERNIA REPAIR      His Family History Is Significant For: Family History  Problem Relation Age of Onset  . High blood pressure Mother   . Diabetes Mellitus I Mother   . Congenital heart disease Mother   . High blood pressure Father   . Diabetes Mellitus I Father   . CAD Father 2  . Migraines Daughter   . Parkinsonism Daughter   . Parkinsonism Son   . High blood pressure Sister   . Congenital heart disease Sister   . High blood pressure Paternal Grandfather     His Social History Is Significant For: Social History   Socioeconomic History  . Marital status: Significant Other    Spouse name: Not on file  . Number of children: 2  . Years of education: 9th  . Highest education level: Not on file  Occupational History  . Occupation: disabled  Social Needs  . Financial resource strain: Not on file  . Food insecurity:    Worry: Not on file    Inability: Not on file  . Transportation needs:    Medical: Not on file    Non-medical: Not on file  Tobacco Use  . Smoking status: Current Every Day Smoker    Packs/day: 1.00    Types: Cigarettes  . Smokeless tobacco: Never Used  Substance and Sexual Activity  . Alcohol use: Not Currently    Alcohol/week: 0.0 standard drinks  . Drug use: No  . Sexual activity: Not Currently  Lifestyle  . Physical activity:    Days per week: Not on file    Minutes per session: Not on file  . Stress: Not on file  Relationships  . Social connections:     Talks on phone: Not on file    Gets together: Not on file    Attends religious service: Not on file    Active member of club or organization: Not on file    Attends meetings of clubs or organizations: Not on file    Relationship status: Not on file  Other Topics Concern  . Not on file  Social History Narrative    Merged History Encounter    Patient  Lives with his girl friend   Patient is right handed   Patient coffee daily        His Allergies Are:  Allergies  Allergen Reactions  . Penicillins  Other (See Comments)    Unknown childhood reaction Has patient had a PCN reaction causing immediate rash, facial/tongue/throat swelling, SOB or lightheadedness with hypotension: Unknown Has patient had a PCN reaction causing severe rash involving mucus membranes or skin necrosis: Unknown Has patient had a PCN reaction that required hospitalization: Unknown Has patient had a PCN reaction occurring within the last 10 years: Unknown If all of the above answers are "NO", then may proceed with Cephalosporin use.   . Toradol [Ketorolac Tromethamine] Hives  :   His Current Medications Are:  Outpatient Encounter Medications as of 05/10/2018  Medication Sig  . albuterol (PROVENTIL HFA;VENTOLIN HFA) 108 (90 Base) MCG/ACT inhaler Inhale 2 puffs into the lungs every 6 (six) hours as needed for wheezing or shortness of breath.  Marland Kitchen aspirin 325 MG tablet Take 1 tablet (325 mg total) by mouth daily.  Marland Kitchen atorvastatin (LIPITOR) 40 MG tablet Take 1 tablet (40 mg total) by mouth at bedtime.  . celecoxib (CELEBREX) 200 MG capsule TAKE ONE CAPSULE BY MOUTH EVERY DAY FOR HIP PAIN. DO NOT TAKE IBUPROFEN WHEN ON THIS  . FLUoxetine (PROZAC) 40 MG capsule Take 1 capsule (40 mg total) by mouth daily.  . fluticasone (FLONASE) 50 MCG/ACT nasal spray Place 1 spray into both nostrils daily as needed for allergies or rhinitis.  Marland Kitchen glipiZIDE (GLUCOTROL) 10 MG tablet Take 10 mg by mouth 2 (two) times daily before a meal.   .  insulin glargine (LANTUS) 100 UNIT/ML injection Inject 30 Units into the skin at bedtime. Takes 15 units in AM  . levETIRAcetam (KEPPRA) 750 MG tablet Take 2 tablets (1,500 mg total) by mouth 2 (two) times daily.  Marland Kitchen lisinopril (PRINIVIL,ZESTRIL) 2.5 MG tablet Take 2.5 mg by mouth daily.  Marland Kitchen LYRICA 150 MG capsule TAKE ONE CAPSULE BY MOUTH TWICE A DAY FOR PAIN  . meclizine (ANTIVERT) 25 MG tablet Take 1 tablet (25 mg total) by mouth 3 (three) times daily as needed for dizziness.  . metFORMIN (GLUCOPHAGE-XR) 500 MG 24 hr tablet Take 2 tablets (1,000 mg total) by mouth 2 (two) times daily.  . metoprolol succinate (TOPROL-XL) 50 MG 24 hr tablet Take 1 tablet (50 mg total) by mouth daily. Take with or immediately following a meal.  . Multiple Vitamins-Minerals (MULTIVITAMIN WITH MINERALS) tablet Take 1 tablet by mouth daily.   No facility-administered encounter medications on file as of 05/10/2018.   :  Review of Systems:  Out of a complete 14 point review of systems, all are reviewed and negative with the exception of these symptoms as listed below: Review of Systems  Objective:  Neurological Exam  Physical Exam Physical Examination:   Vitals:   05/10/18 1357  BP: 108/70  Pulse: 76    General Examination: The patient is a very pleasant 47 y.o. male in no acute distress. He appears well-developed and well-nourished and well groomed.   HEENT: Normocephalic, atraumatic, pupils are equal, L eye blindness, visually impaired on the R, thick eye glasses. Extraocular tracking is good without limitation to gaze excursion or nystagmus noted. Normal smooth pursuit is noted. Hearing is grossly intact. Face is symmetric with normal facial animation and normal facial sensation. Speech is clear with no dysarthria noted. There is no hypophonia. There is no lip, neck/head, jaw or voice tremor. Neck shows FROM. Oropharynx exam reveals: mild mouth dryness, adequate dental hygiene with full dentures in place and  moderate airway crowding. Tongue protrudes centrally and palate elevates symmetrically.   Chest: Clear to auscultation  without wheezing, rhonchi or crackles noted.  Heart: S1+S2+0, regular and normal without murmurs, rubs or gallops noted.   Abdomen: Soft, non-tender and non-distended with normal bowel sounds appreciated on auscultation.  Extremities: There is no pitting edema in the distal lower extremities bilaterally.   Skin: Warm and dry without trophic changes noted.  Musculoskeletal: exam reveals no obvious joint deformities, tenderness or joint swelling or erythema.   Neurologically:  Mental status: The patient is awake, alert and oriented in all 4 spheres. His immediate and remote memory, attention, language skills and fund of knowledge are appropriate. There is no evidence of aphasia, agnosia, apraxia or anomia. Speech is clear with normal prosody and enunciation. Thought process is linear. Mood is normal and affect is normal.  Cranial nerves II - XII are as described above under HEENT exam. Motor exam: Normal bulk, strength and tone is noted, R hip flexor weakness. There is no drift, tremor or rebound. Reflexes are 1+ in the UEs and absent in the LEs. Fine motor skills and coordination: grossly intact.  Cerebellar testing: No dysmetria or intention tremor.  Sensory exam: intact to light touch in the upper and lower extremities.  Gait, station and balance: He stands easily. No veering to one side is noted. No leaning to one side is noted. Posture is age-appropriate and stance is narrow based. Gait shows normal stride length and normal pace. No problems turning are noted. Walks with a cane/stick.   Assessment and Plan:   In summary, Miguel Diaz. is a 47 year old male with an underlying medical history of stroke, seizure disorder, neuropathy, diabetes, depression, asthma, anxiety, vision impairment, hyperlipidemia, and obesity, who presents for follow-up consultation of  his obstructive sleep apnea, which was determined to be in the moderate range during his baseline sleep study in July 2019. He did well with CPAP therapy and return for a titration study in August 2019. He is compliant with his CPAP and commended for his treatment adherence. He has noted less sleepiness during the day and wife endorses that EDS is indeed better. He is still adjusting to treatment, would like to try nasal mask. I placed an order for a mask refit.  I suggested he keep his appointment with Ward Givens in April and if he continues to do well on CPAP he is advised to follow-up yearly thereafter. I explained his sleep study results to the patient and his wife today and we also reviewed his compliance data. I answered all her questions today and they were in agreement with the plan. I spent 25 minutes in total face-to-face time with the patient, more than 50% of which was spent in counseling and coordination of care, reviewing test results, reviewing medication and discussing or reviewing the diagnosis of OSA, its prognosis and treatment options. Pertinent laboratory and imaging test results that were available during this visit with the patient were reviewed by me and considered in my medical decision making (see chart for details).

## 2018-05-10 NOTE — Patient Instructions (Signed)
Please continue using your CPAP regularly. While your insurance requires that you use CPAP at least 4 hours each night on 70% of the nights, I recommend, that you not skip any nights and use it throughout the night if you can. Getting used to CPAP and staying with the treatment long term does take time and patience and discipline. Untreated obstructive sleep apnea when it is moderate to severe can have an adverse impact on cardiovascular health and raise her risk for heart disease, arrhythmias, hypertension, congestive heart failure, stroke and diabetes. Untreated obstructive sleep apnea causes sleep disruption, nonrestorative sleep, and sleep deprivation. This can have an impact on your day to day functioning and cause daytime sleepiness and impairment of cognitive function, memory loss, mood disturbance, and problems focussing. Using CPAP regularly can improve these symptoms.  Keep up the good work! You can FU as scheduled with Butch Penny in April 2020 and then yearly thereafter from the sleep standpoint.

## 2018-05-10 NOTE — Progress Notes (Signed)
Order for mask refit sent to [facility] via community message in [employer]. Confirmation received that the order transmitted was successful.  

## 2018-08-09 ENCOUNTER — Ambulatory Visit: Payer: Medicare Other | Admitting: Nurse Practitioner

## 2018-09-24 ENCOUNTER — Other Ambulatory Visit: Payer: Self-pay | Admitting: Nurse Practitioner

## 2018-10-16 ENCOUNTER — Telehealth: Payer: Self-pay

## 2018-10-16 NOTE — Telephone Encounter (Signed)
Pt gives consent  email - [email or web address]

## 2018-10-16 NOTE — Telephone Encounter (Signed)
I called pt. He is agreeable to a virtual visit with Dr. Frances Furbish on 10/19/18 at 1:00pm.  Pt understands that although there may be some limitations with this type of visit, we will take all precautions to reduce any security or privacy concerns.  Pt understands that this will be treated like an in office visit and we will file with pt's insurance, and there may be a patient responsible charge related to this service.  Pt's email is [email or web address]. Pt understands that the [employer] webex software must be downloaded and operational on the device pt plans to use for the visit.  Pt's meds, allergies, and PMH were updated.  Pt reports that his cpap is going well.  Pt's recent weight is 213 lbs and he is 5'8.

## 2018-10-16 NOTE — Telephone Encounter (Signed)
Due to current COVID 19 pandemic, our office is severely reducing in office visits for at least the next 2 weeks, in order to minimize the risk to our patients and healthcare providers.   Megan, NP is also out of the office on 11/02/18.  I called pt to discuss converting his appt to a virtual visit with Dr. Frances Furbish. No answer, left a message asking him to call back. If pt calls back, please discuss this with him and obtain consent for the virtual visit.

## 2018-10-17 ENCOUNTER — Encounter: Payer: Self-pay | Admitting: Adult Health

## 2018-10-19 ENCOUNTER — Encounter: Payer: Self-pay | Admitting: Neurology

## 2018-10-19 ENCOUNTER — Ambulatory Visit (INDEPENDENT_AMBULATORY_CARE_PROVIDER_SITE_OTHER): Payer: Medicare Other | Admitting: Neurology

## 2018-10-19 ENCOUNTER — Other Ambulatory Visit: Payer: Self-pay

## 2018-10-19 DIAGNOSIS — Z9989 Dependence on other enabling machines and devices: Secondary | ICD-10-CM | POA: Diagnosis not present

## 2018-10-19 DIAGNOSIS — G4733 Obstructive sleep apnea (adult) (pediatric): Secondary | ICD-10-CM

## 2018-10-19 NOTE — Telephone Encounter (Signed)
Pt called in and stated he lost the email and wants to know if it can be sent back to him

## 2018-10-19 NOTE — Patient Instructions (Signed)
Given verbally, during today's virtual video-based encounter, with verbal feedback received.   

## 2018-10-19 NOTE — Telephone Encounter (Signed)
I called pt, completed a test run for the virtual visit. It was a success and pt understands that he should join his meeting with Dr. Frances Furbish around 12:55 today.

## 2018-10-19 NOTE — Progress Notes (Signed)
Interim history:  Miguel Diaz is a 48 year old right-handed gentleman with an underlying medical history of stroke, seizure disorder, neuropathy, diabetes, depression, asthma, anxiety, vision loss, hyperlipidemia, and obesity, with whom I am conducting a virtual, video based appointment via Webex in lieu of a face-to-face visit for follow-up consultation of his obstructive sleep apnea, on treatment with CPAP. The patient is unaccompanied today and joins via phone from home. I last saw him on 05/10/2018, at which time we talked about his baseline sleep study from July 2019 and his subsequent CPAP titration study from August 2019. He was compliant with CPAP therapy. He was still adjusting to treatment and requested a mask change. He did notice improvement in his daytime somnolence and his wife endorsed that he was less sleepy during the day.   Today, 10/19/2018: Please also see below for virtual visit documentation.   I reviewed his CPAP compliance data from 09/18/2018 through 10/17/2018 which is a total of 30 days, during which time he used his machine 29 days with percent used days greater than 4 hours at 80%, indicating very good compliance with an average usage of 6 hours and 9 minutes, residual AHI at goal at 1.4 per hour, leak on the higher end with the 95th percentile at 19.3 L/m on a pressure of 11 cm with EPR of 3.   The patient's allergies, current medications, family history, past medical history, past social history, past surgical history and problem list were reviewed and updated as appropriate.    Previously:  I first met him on 12/13/17 at the request on Miguel Rubin, NP, at which time he reported snoring and daytime somnolence as well as witnessed apneas. He was advised to proceed with a sleep study. He had a baseline sleep study, followed by a CPAP titration study. I went over his test results with him in detail today. Baseline sleep study from 01/24/2018 showed a sleep efficiency of  76.8%, sleep latency of 27 minutes and REM latency delayed at 147.5 minutes. He had a reduced percentage of REM sleep and an increased percentage of light stage sleep. Total AHI was 20.4 per hour, REM AHI was 46.7 per hour, supine AHI 45.1 per hour. Average oxygen saturation was 92%, nadir was 86%. He had no significant PLMS. Based on his test results he was advised to proceed with a second sleep study for CPAP titration. He had this on 02/09/2018. Sleep efficiency was 82.4%, sleep latency 11.5 minutes, REM latency markedly delayed at 208 minutes. He was titrated via full face mask from 5 cm to 9 cm. On the final pressure his AHI was 3.5 per hour with O2 nadir of 80% briefly. I suggested he proceed with CPAP therapy at home with a treatment pressure of 11 cm.  I reviewed his CPAP compliance data from 04/09/2018 through 05/08/2018 which is a total of 30 days, during which time he used his CPAP every night with percent used days greater than 4 hours at 97%, indicating excellent compliance with an average usage of 7 hours and 3 minutes, residual AHI at goal at 2.8 per hour, leak acceptable with the 95th percentile at 13.6 L/m on a pressure of 11 cm with EPR of 3.    12/13/2017: (He) reports snoring and excessive daytime somnolence. He has witnessed apneas, per wife's report. I reviewed your office note from 12/07/2017. He had a home sleep test many years ago but results are unknown. His Epworth sleepiness score is 19 out of 24, fatigue  score is 59 out of 63.  He reports a family history of epilepsy. 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 He had reduced his smoking but is now back to one pack per day. He does not drink alcohol on a regular basis, last time he tried drinking some beer it tasted badly. He has diabetic neuropathy. 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 He denies nighttime nocturia or morning headaches. He does not drive. His wife has sleep apnea and uses  CPAP successfully. He would be willing to consider CPAP therapy. Bedtime is around midnight and rises type varies, he has significant sleep disruption and gets out of bed around 10 or 11. He drinks caffeine in the form of half decaf coffee, 1 pot per day, he does not typically drink a whole lot of water he admits. He has some residual right-sided weakness from his stroke. He and his wife had to move in with her sister. There are 2 dogs in the household, mother also lives there.  His Past Medical History Is Significant For: Past Medical History:  Diagnosis Date   Anxiety    Asthma    Blindness    bilateral   Depression    Diabetes mellitus without complication ([place])    Hypercholesteremia    Neuropathy    PSVT (paroxysmal supraventricular tachycardia) ([place]) 06/2015   Seizure ([place])    Stroke ([place]) 02/2014   right sided weakness/numbness   Vision loss of left eye     His Past Surgical History Is Significant For: Past Surgical History:  Procedure Laterality Date   ABDOMINAL SURGERY     EYE SURGERY Left    HERNIA REPAIR      His Family History Is Significant For: Family History  Problem Relation Diaz of Onset   High blood pressure Mother    Diabetes Mellitus I Mother    Congenital heart disease Mother    High blood pressure Father    Diabetes Mellitus I Father    CAD Father 70   Migraines Daughter    Parkinsonism Daughter    Parkinsonism Son    High blood pressure Sister    Congenital heart disease Sister    High blood pressure Paternal Grandfather     His Social History Is Significant For: Social History   Socioeconomic History   Marital status: Significant Other    Spouse name: Not on file   Number of children: 2   Years of education: 9th   Highest education level: Not on file  Occupational History   Occupation: disabled  Scientist, product/process development strain: Not on file   Food insecurity:    Worry: Not on file    Inability:  Not on file   Transportation needs:    Medical: Not on file    Non-medical: Not on file  Tobacco Use   Smoking status: Current Every Day Smoker    Packs/day: 1.00    Types: Cigarettes   Smokeless tobacco: Never Used  Substance and Sexual Activity   Alcohol use: Not Currently    Alcohol/week: 0.0 standard drinks   Drug use: No   Sexual activity: Not Currently  Lifestyle   Physical activity:    Days per week: Not on file    Minutes per session: Not on file   Stress: Not on file  Relationships   Social connections:    Talks on phone: Not on file    Gets  together: Not on file    Attends religious service: Not on file    Active member of club or organization: Not on file    Attends meetings of clubs or organizations: Not on file    Relationship status: Not on file  Other Topics Concern   Not on file  Social History Narrative    Merged History Encounter    Patient  Lives with his girl friend   Patient is right handed   Patient coffee daily        His Allergies Are:  Allergies  Allergen Reactions   Penicillins Other (See Comments)    Unknown childhood reaction Has patient had a PCN reaction causing immediate rash, facial/tongue/throat swelling, SOB or lightheadedness with hypotension: Unknown Has patient had a PCN reaction causing severe rash involving mucus membranes or skin necrosis: Unknown Has patient had a PCN reaction that required hospitalization: Unknown Has patient had a PCN reaction occurring within the last 10 years: Unknown If all of the above answers are "NO", then may proceed with Cephalosporin use.    Toradol [Ketorolac Tromethamine] Hives  :   His Current Medications Are:  Outpatient Encounter Medications as of 10/19/2018  Medication Sig   albuterol (PROVENTIL HFA;VENTOLIN HFA) 108 (90 Base) MCG/ACT inhaler Inhale 2 puffs into the lungs every 6 (six) hours as needed for wheezing or shortness of breath.   aspirin 325 MG tablet Take 1 tablet  (325 mg total) by mouth daily.   atorvastatin (LIPITOR) 40 MG tablet Take 1 tablet (40 mg total) by mouth at bedtime.   celecoxib (CELEBREX) 200 MG capsule TAKE ONE CAPSULE BY MOUTH EVERY DAY FOR HIP PAIN. DO NOT TAKE IBUPROFEN WHEN ON THIS   FLUoxetine (PROZAC) 40 MG capsule Take 1 capsule (40 mg total) by mouth daily.   fluticasone (FLONASE) 50 MCG/ACT nasal spray Place 1 spray into both nostrils daily as needed for allergies or rhinitis.   glipiZIDE (GLUCOTROL) 10 MG tablet Take 10 mg by mouth 2 (two) times daily before a meal.    insulin glargine (LANTUS) 100 UNIT/ML injection Inject 30 Units into the skin at bedtime. Takes 15 units in AM   levETIRAcetam (KEPPRA) 750 MG tablet TAKE 2 TABLETS BY MOUTH TWICE A DAY   lisinopril (PRINIVIL,ZESTRIL) 2.5 MG tablet Take 2.5 mg by mouth daily.   LYRICA 150 MG capsule TAKE ONE CAPSULE BY MOUTH TWICE A DAY FOR PAIN   meclizine (ANTIVERT) 25 MG tablet Take 1 tablet (25 mg total) by mouth 3 (three) times daily as needed for dizziness.   metFORMIN (GLUCOPHAGE-XR) 500 MG 24 hr tablet Take 2 tablets (1,000 mg total) by mouth 2 (two) times daily.   metoprolol succinate (TOPROL-XL) 50 MG 24 hr tablet Take 1 tablet (50 mg total) by mouth daily. Take with or immediately following a meal.   Multiple Vitamins-Minerals (MULTIVITAMIN WITH MINERALS) tablet Take 1 tablet by mouth daily.   No facility-administered encounter medications on file as of 10/19/2018.   :  Review of Systems:  Out of a complete 14 point review of systems, all are reviewed and negative with the exception of these symptoms as listed below:  Virtual Visit via Video Note on @ TODAY@  I connected with@ on 10/19/18 at  1:00 PM EDT by a video enabled telemedicine application and verified that I am speaking with the correct person using two identifiers.   I discussed the limitations of evaluation and management by telemedicine and the availability of in person appointments. The  patient expressed understanding and agreed to proceed.  History of Present Illness: He reports doing well with his CPAP, sometimes he falls asleep without it but tries to remember using it. His sleep fluctuates a little bit, bedtime between 11:30 and midnight and rise time between 5:30 and 7:30. He has not seen Dr. Jaynee Eagles in some time, used to see [facility] practitioner who retired recently. He does not have an appointment pending for seizure follow-up. He is using a fullface mask for his CPAP, he needs new supplies. He does not always drink enough water he admits, he likes drinking coffee and soda. He indicates good results with CPAP, feels well rested. He lives with his wife and wife's mom. They have 3 dogs in the household.   Observations/Objective:  There are no recent vital signs available for my review today. His most recent vital signs are from 05/10/2018. He is pleasant, conversant, in no acute distress. He wears thick corrective eyeglasses. Face is symmetric, speech clear without dysarthria noted. He has intact extraocular movements grossly. Upper body movements are grossly intact. He may be mildly hard of hearing.   Assessment and Plan: In [place] a 48 year old malewith an underlying medical history of stroke, seizure disorder, neuropathy, diabetes, depression, asthma, anxiety, vision impairment, hyperlipidemia, and obesity, who presents for a virtual, video based follow-up consultation of his obstructive sleep apnea, for which he has been on CPAP therapy. He is compliant with treatment and commended for it. He had a baseline sleep study in July 2019 which determined OSA in the moderate range. He had a CPAP titration study in August 2019 and did well with CPAP. He indicates ongoing good results including better sleep quality and less daytime tiredness. He is reminded to be fully compliant with CPAP therapy and routinely follow-up with one of our nurse  practitioners in 1 year. I will order new supplies for him through his DME company. Furthermore, he is advised to follow-up with Dr. Jaynee Eagles for his seizure disorder as he has not had a follow-up recently.  I answered all his questions today and he was in agreement.  Follow Up Instructions: 1. Continue using CPAP regularly with full compliance, patient is commended for treatment adherence. 2. Follow-up yearly, with NP next time.  3.   Schedule follow-up with Dr. Jaynee Eagles.  4.   CPAP supply order placed, will fax to DME company. 3. Call or email through My Chart for any interim questions or concerns.    I discussed the assessment and treatment plan with the patient. The patient was provided an opportunity to ask questions and all were answered. The patient agreed with the plan and demonstrated an understanding of the instructions.   The patient was advised to call back or seek an in-person evaluation if the symptoms worsen or if the condition fails to improve as anticipated.  I provided 20 minutes of non-face-to-face time during this encounter.   Miguel Age, MD

## 2018-10-19 NOTE — Progress Notes (Signed)
Order for cpap supplies sent to [facility] via community message. Confirmation received that the order transmitted was successful.  

## 2018-10-19 NOTE — Telephone Encounter (Signed)
I re-sent the email

## 2018-10-23 ENCOUNTER — Telehealth: Payer: Self-pay | Admitting: Neurology

## 2018-10-23 NOTE — Telephone Encounter (Signed)
LVM to schedule 1 year follow-up with Aundra Millet NP after virtual visit per Dr. Frances Furbish, and 2 month follow-up with Dr. Lucia Gaskins for SZ per Dr. Frances Furbish.

## 2018-11-02 ENCOUNTER — Ambulatory Visit: Payer: Medicaid Other | Admitting: Adult Health

## 2019-05-29 ENCOUNTER — Telehealth: Payer: Self-pay | Admitting: Cardiovascular Disease

## 2019-05-29 NOTE — Telephone Encounter (Signed)
Virtual Visit Pre-Appointment Phone Call  "(Name), I am calling you today to discuss your upcoming appointment. We are currently trying to limit exposure to the virus that causes COVID-19 by seeing patients at home rather than in the office."  "What is the BEST phone number to call the day of the visit?" -[phone]   1. Do you have or have access to (through a family member/friend) a smartphone with video capability that we can use for your visit?" a. If yes - list this number in appt notes as cell (if different from BEST phone #) and list the appointment type as a VIDEO visit in appointment notes b. If no - list the appointment type as a PHONE visit in appointment notes  2. Confirm consent - "In the setting of the current Covid19 crisis, you are scheduled for a (phone or video) visit with your provider on (date) at (time).  Just as we do with many in-office visits, in order for you to participate in this visit, we must obtain consent.  If you'd like, I can send this to your mychart (if signed up) or email for you to review.  Otherwise, I can obtain your verbal consent now.  All virtual visits are billed to your insurance company just like a normal visit would be.  By agreeing to a virtual visit, we'd like you to understand that the technology does not allow for your provider to perform an examination, and thus may limit your provider's ability to fully assess your condition. If your provider identifies any concerns that need to be evaluated in person, we will make arrangements to do so.  Finally, though the technology is pretty good, we cannot assure that it will always work on either your or our end, and in the setting of a video visit, we may have to convert it to a phone-only visit.  In either situation, we cannot ensure that we have a secure connection.  Are you willing to proceed?" STAFF: Did the patient verbally acknowledge consent to telehealth visit? Document YES/NO here: YES    3. Advise patient to be prepared - "Two hours prior to your appointment, go ahead and check your blood pressure, pulse, oxygen saturation, and your weight (if you have the equipment to check those) and write them all down. When your visit starts, your provider will ask you for this information. If you have an Apple Watch or Kardia device, please plan to have heart rate information ready on the day of your appointment. Please have a pen and paper handy nearby the day of the visit as well."  4. Give patient instructions for MyChart download to smartphone OR Doximity/Doxy.me as below if video visit (depending on what platform provider is using)  5. Inform patient they will receive a phone call 15 minutes prior to their appointment time (may be from unknown caller ID) so they should be prepared to answer    Miguel Svec Jr. has been deemed a candidate for a follow-up tele-health visit to limit community exposure during the Covid-19 pandemic. I spoke with the patient via phone to ensure availability of phone/video source, confirm preferred email & phone number, and discuss instructions and expectations.  I reminded Miguel Res. to be prepared with any vital sign and/or heart rhythm information that could potentially be obtained via home monitoring, at the time of his visit. I reminded Miguel Res. to expect a phone call prior to his  visit.  Megan Salon 05/29/2019 12:48 PM   INSTRUCTIONS FOR DOWNLOADING THE MYCHART APP TO SMARTPHONE  - The patient must first make sure to have activated MyChart and know their login information - If Apple, go to [employer] and type in MyChart in the search bar and download the app. If Android, ask patient to go to [employer] and type in [place] in the search bar and download the app. The app is free but as with any other app downloads, their phone may require them to verify saved payment information or  Apple/Android password.  - The patient will need to then log into the app with their MyChart username and password, and select [facility] as their healthcare provider to link the account. When it is time for your visit, go to the MyChart app, find appointments, and click Begin Video Visit. Be sure to Select Allow for your device to access the Microphone and Camera for your visit. You will then be connected, and your provider will be with you shortly.  **If they have any issues connecting, or need assistance please contact MyChart service desk [phone])**  **If using a computer, in order to ensure the best quality for their visit they will need to use either of the following Internet Browsers: [employer] or Google Chrome**  IF USING DOXIMITY or DOXY.ME - The patient will receive a link just prior to their visit by text.     FULL LENGTH CONSENT FOR TELE-HEALTH VISIT   I hereby voluntarily request, consent and authorize [facility] and its employed or contracted physicians, physician assistants, nurse practitioners or other licensed health care professionals (the Practitioner), to provide me with telemedicine health care services (the Services") as deemed necessary by the treating Practitioner. I acknowledge and consent to receive the Services by the Practitioner via telemedicine. I understand that the telemedicine visit will involve communicating with the Practitioner through live audiovisual communication technology and the disclosure of certain medical information by electronic transmission. I acknowledge that I have been given the opportunity to request an in-person assessment or other available alternative prior to the telemedicine visit and am voluntarily participating in the telemedicine visit.  I understand that I have the right to withhold or withdraw my consent to the use of telemedicine in the course of my care at any time, without affecting my right to future care  or treatment, and that the Practitioner or I may terminate the telemedicine visit at any time. I understand that I have the right to inspect all information obtained and/or recorded in the course of the telemedicine visit and may receive copies of available information for a reasonable fee.  I understand that some of the potential risks of receiving the Services via telemedicine include:   Delay or interruption in medical evaluation due to technological equipment failure or disruption;  Information transmitted may not be sufficient (e.g. poor resolution of images) to allow for appropriate medical decision making by the Practitioner; and/or   In rare instances, security protocols could fail, causing a breach of personal health information.  Furthermore, I acknowledge that it is my responsibility to provide information about my medical history, conditions and care that is complete and accurate to the best of my ability. I acknowledge that Practitioner's advice, recommendations, and/or decision may be based on factors not within their control, such as incomplete or inaccurate data provided by me or distortions of diagnostic images or specimens that may result from electronic transmissions. I understand  that the practice of medicine is not an exact science and that Practitioner makes no warranties or guarantees regarding treatment outcomes. I acknowledge that I will receive a copy of this consent concurrently upon execution via email to the email address I last provided but may also request a printed copy by calling the office of [facility].    I understand that my insurance will be billed for this visit.   I have read or had this consent read to me.  I understand the contents of this consent, which adequately explains the benefits and risks of the Services being provided via telemedicine.   I have been provided ample opportunity to ask questions regarding this consent and the Services and have had  my questions answered to my satisfaction.  I give my informed consent for the services to be provided through the use of telemedicine in my medical care  By participating in this telemedicine visit I agree to the above.

## 2019-06-07 ENCOUNTER — Telehealth (INDEPENDENT_AMBULATORY_CARE_PROVIDER_SITE_OTHER): Payer: Medicare Other | Admitting: Cardiovascular Disease

## 2019-06-07 ENCOUNTER — Encounter: Payer: Self-pay | Admitting: Cardiovascular Disease

## 2019-06-07 VITALS — Ht 68.0 in | Wt 205.0 lb

## 2019-06-07 DIAGNOSIS — E1169 Type 2 diabetes mellitus with other specified complication: Secondary | ICD-10-CM

## 2019-06-07 DIAGNOSIS — G4733 Obstructive sleep apnea (adult) (pediatric): Secondary | ICD-10-CM

## 2019-06-07 DIAGNOSIS — E1142 Type 2 diabetes mellitus with diabetic polyneuropathy: Secondary | ICD-10-CM

## 2019-06-07 DIAGNOSIS — Z9989 Dependence on other enabling machines and devices: Secondary | ICD-10-CM

## 2019-06-07 DIAGNOSIS — I25118 Atherosclerotic heart disease of native coronary artery with other forms of angina pectoris: Secondary | ICD-10-CM | POA: Diagnosis not present

## 2019-06-07 DIAGNOSIS — Z720 Tobacco use: Secondary | ICD-10-CM

## 2019-06-07 NOTE — Progress Notes (Signed)
Virtual Visit via Telephone Note   This visit type was conducted due to national recommendations for restrictions regarding the COVID-19 Pandemic (e.g. social distancing) in an effort to limit this patient's exposure and mitigate transmission in our community.  Due to his co-morbid illnesses, this patient is at least at moderate risk for complications without adequate follow up.  This format is felt to be most appropriate for this patient at this time.  The patient did not have access to video technology/had technical difficulties with video requiring transitioning to audio format only (telephone).  All issues noted in this document were discussed and addressed.  No physical exam could be performed with this format.  Please refer to the patient's chart for his  consent to telehealth for [facility].   Date:  06/07/2019   ID:  [name], DOB 09-Sep-1970, MRN [id number]  Patient Location: Home Provider Location: Office  PCP:  Sandi Mealy, MD  Cardiologist:  Kate Sable, MD  Electrophysiologist:  None   Evaluation Performed:  Follow-Up Visit  Chief Complaint:  CAD  History of Present Illness:    Miguel Diaz. is a 48 y.o. male with a history of extensive coronary artery disease without hemodynamically significant stenoses by coronary CT angiography performed in May 2018. He presented at that time with atypical chest discomfort and coronary CTA showed a calcium score at 94th percentile for age/gender, 50-69% stenosis in the mid LAD and scattered lesions throughout all 3 major coronary arteries.  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  He has a long history of tobacco abuse and smokes about a pack of cigarettes daily.  He denies any significant exertional chest pain, palpitations and shortness of breath.  He has dizziness sometimes when he stands up too quickly.  He previously told me he has a history of a minor  stroke in 2015.  He continues to smoke 1 pack of cigarettes daily.    Past Medical History:  Diagnosis Date  . Anxiety   . Asthma   . Blindness    bilateral  . Depression   . Diabetes mellitus without complication ([place])   . Hypercholesteremia   . Neuropathy   . PSVT (paroxysmal supraventricular tachycardia) ([place]) 06/2015  . Seizure ([place])   . Stroke ([place]) 02/2014   right sided weakness/numbness  . Vision loss of left eye    Past Surgical History:  Procedure Laterality Date  . ABDOMINAL SURGERY    . EYE SURGERY Left   . HERNIA REPAIR       Current Meds  Medication Sig  . albuterol (PROVENTIL HFA;VENTOLIN HFA) 108 (90 Base) MCG/ACT inhaler Inhale 2 puffs into the lungs every 6 (six) hours as needed for wheezing or shortness of breath.  Marland Kitchen aspirin 325 MG tablet Take 1 tablet (325 mg total) by mouth daily.  Marland Kitchen atorvastatin (LIPITOR) 40 MG tablet Take 1 tablet (40 mg total) by mouth at bedtime.  . celecoxib (CELEBREX) 200 MG capsule TAKE ONE CAPSULE BY MOUTH EVERY DAY FOR HIP PAIN. DO NOT TAKE IBUPROFEN WHEN ON THIS  . FLUoxetine (PROZAC) 40 MG capsule Take 1 capsule (40 mg total) by mouth daily.  . fluticasone (FLONASE) 50 MCG/ACT nasal spray Place 1 spray into both nostrils daily as needed for allergies or rhinitis.  Marland Kitchen glipiZIDE (GLUCOTROL) 10 MG tablet Take 10 mg by mouth 2 (two) times daily before a meal.   . insulin NPH  Human (NOVOLIN N) 100 UNIT/ML injection Inject 12 Units into the skin 3 (three) times daily.  Marland Kitchen levETIRAcetam (KEPPRA) 750 MG tablet TAKE 2 TABLETS BY MOUTH TWICE A DAY  . lisinopril (PRINIVIL,ZESTRIL) 2.5 MG tablet Take 2.5 mg by mouth daily.  Marland Kitchen LYRICA 150 MG capsule TAKE ONE CAPSULE BY MOUTH TWICE A DAY FOR PAIN  . meclizine (ANTIVERT) 25 MG tablet Take 1 tablet (25 mg total) by mouth 3 (three) times daily as needed for dizziness.  . metFORMIN (GLUCOPHAGE-XR) 500 MG 24 hr tablet Take 2 tablets (1,000 mg total) by mouth 2 (two) times daily.  . metoprolol  succinate (TOPROL-XL) 50 MG 24 hr tablet Take 1 tablet (50 mg total) by mouth daily. Take with or immediately following a meal.  . Multiple Vitamins-Minerals (MULTIVITAMIN WITH MINERALS) tablet Take 1 tablet by mouth daily.     Allergies:   Penicillins and Toradol [ketorolac tromethamine]   Social History   Tobacco Use  . Smoking status: Current Every Day Smoker    Packs/day: 1.00    Types: Cigarettes  . Smokeless tobacco: Never Used  Substance Use Topics  . Alcohol use: Not Currently    Alcohol/week: 0.0 standard drinks  . Drug use: No     Family Hx: The patient's family history includes CAD (age of onset: 20) in his father; Congenital heart disease in his mother and sister; Diabetes Mellitus I in his father and mother; High blood pressure in his father, mother, paternal grandfather, and sister; Migraines in his daughter; Parkinsonism in his daughter and son.  ROS:   Please see the history of present illness.     All other systems reviewed and are negative.   Prior CV studies:   The following studies were reviewed today:  Reviewed above  Labs/Other Tests and Data Reviewed:    EKG:  No ECG reviewed.  Recent Labs: No results found for requested labs within last 8760 hours.   Recent Lipid Panel Lab Results  Component Value Date/Time   CHOL 303 (H) 11/15/2016 06:39 AM   TRIG 385 (H) 11/15/2016 06:39 AM   HDL 32 (L) 11/15/2016 06:39 AM   CHOLHDL 9.5 11/15/2016 06:39 AM   LDLCALC 194 (H) 11/15/2016 06:39 AM    Wt Readings from Last 3 Encounters:  06/07/19 205 lb (93 kg)  05/10/18 215 lb (97.5 kg)  05/03/18 215 lb (97.5 kg)     Objective:    Vital Signs:  Ht 5\' 8"  (1.727 m)   Wt 205 lb (93 kg)   BMI 31.17 kg/m    VITAL SIGNS:  reviewed  ASSESSMENT & PLAN:    1.  Coronary artery disease: Symptomatically stable.  At very high risk of progression and adverse cardiac events. No ischemia by nuclear stress test June 2017, no high-grade stenosis by coronary CT  angiography May 2018. On aspirin (325 mg for history of TIA), atorvastatinand metoprolol succinate.  2.  Hyperlipidemia: LDL normal at 72 in June 2020.    Continue atorvastatin 40 mg.  3.  Type 2 diabetes mellitus: Currently on metformin, Lantus and glipizide.  I would recommend an SGLT2 inhibitor given his significantly elevated cardiac risk.  He is on atorvastatin 40 mg.  4.  Tobacco abuse: Not a good candidate for Wellbutrin or Chantix given a history of seizures.  Nicotine replacement products can be utilized.  It appears he has no desire to quit.  5.  Obstructive sleep apnea: Uses CPAP.  6.  History of TIA: On aspirin (325  mg) and atorvastatin.   COVID-19 Education: The signs and symptoms of COVID-19 were discussed with the patient and how to seek care for testing (follow up with PCP or arrange E-visit).  The importance of social distancing was discussed today.  Time:   Today, I have spent 10 minutes with the patient with telehealth technology discussing the above problems.     Medication Adjustments/Labs and Tests Ordered: Current medicines are reviewed at length with the patient today.  Concerns regarding medicines are outlined above.   Tests Ordered: No orders of the defined types were placed in this encounter.   Medication Changes: No orders of the defined types were placed in this encounter.   Follow Up:  Either In Person or Virtual in 1 year(s)  Signed, Prentice DockerSuresh Koneswaran, MD  06/07/2019 10:48 AM    [facility]

## 2019-06-07 NOTE — Patient Instructions (Signed)

## 2019-07-06 ENCOUNTER — Telehealth: Payer: Self-pay | Admitting: Neurology

## 2019-07-09 ENCOUNTER — Encounter: Payer: Self-pay | Admitting: Neurology

## 2019-07-16 NOTE — Telephone Encounter (Signed)
Patient called in and stated his Keppra needs to be routed to [facility]   [facility] - [place], [place] - [address]

## 2019-07-16 NOTE — Telephone Encounter (Signed)
I returned the call and his wife answered the phone. She is going to ask [facility] to transfer his refills from [facility].

## 2019-07-19 MED ORDER — LEVETIRACETAM 750 MG PO TABS: 1500.0000 mg | ORAL_TABLET | Freq: Two times a day (BID) | ORAL | 0 refills | Status: DC

## 2019-07-19 NOTE — Addendum Note (Signed)
Addended by: Lindell Spar C on: 07/19/2019 10:41 AM   Modules accepted: Orders

## 2019-07-19 NOTE — Telephone Encounter (Signed)
Prescription sent to [facility].  The patient has a pending appt on 10/03/2019.

## 2019-07-19 NOTE — Telephone Encounter (Signed)
Pt has called stating he is still waiting on the levETIRAcetam (KEPPRA) 750 MG tablet to be called into [facility].

## 2019-10-03 ENCOUNTER — Encounter: Payer: Self-pay | Admitting: Neurology

## 2019-10-03 ENCOUNTER — Ambulatory Visit: Payer: Medicare Other | Admitting: Neurology

## 2019-10-03 NOTE — Progress Notes (Deleted)
PATIENT: Miguel Diaz. DOB: [birth year]  REASON FOR VISIT: follow up HISTORY FROM: patient  HISTORY OF PRESENT ILLNESS: Today 10/03/19  Miguel Diaz is a 49 year old male with history of seizure disorder.  His last seizure was in June 2018.  He remains on Keppra 1500 mg twice a day.  He has history of stroke and is on aspirin and Lipitor for secondary stroke prevention.  He is on CPAP for moderate to severe OSA.  Review of CPAP download shows usage 28/30 (93%) of the last 30 days, greater than 4 hours 26 days (87%).  He has a set pressure of 11 cmH20.  He has a leak in the 95th percentile of 6.3, AHI was 1.0.  Review of download indicates excellent compliance.  HISTORY HISTORY OF PRESENT ILLNESS:UPDATE 6/5/2019CM Miguel Diaz, 49 year old male returns for follow-up with history of seizure disorder.  Last seizure occurred in June 2018.  He is currently on Keppra 1500 mg twice a day.  He also has a history of BPPV.  He is diabetic on insulin.  Most recent hemoglobin A1c on 09/12/2017 he has a history of stroke and is on aspirin and Lipitor for secondary stroke prevention without further stroke or TIA symptoms.  He ambulates with a cane no recent falls.  He has a new complaint of snoring and daytime drowsiness.  He has never had a sleep study.  He returns for reevaluation    REVIEW OF SYSTEMS: Out of a complete 14 system review of symptoms, the patient complains only of the following symptoms, and all other reviewed systems are negative.  ALLERGIES: Allergies  Allergen Reactions  . Penicillins Other (See Comments)    Unknown childhood reaction Has patient had a PCN reaction causing immediate rash, facial/tongue/throat swelling, SOB or lightheadedness with hypotension: Unknown Has patient had a PCN reaction causing severe rash involving mucus membranes or skin necrosis: Unknown Has patient had a PCN reaction that required hospitalization: Unknown Has patient had a PCN reaction  occurring within the last 10 years: Unknown If all of the above answers are "NO", then may proceed with Cephalosporin use.   Miguel Diaz Toradol [Ketorolac Tromethamine] Hives    HOME MEDICATIONS: Outpatient Medications Prior to Visit  Medication Sig Dispense Refill  . albuterol (PROVENTIL HFA;VENTOLIN HFA) 108 (90 Base) MCG/ACT inhaler Inhale 2 puffs into the lungs every 6 (six) hours as needed for wheezing or shortness of breath. 2 Inhaler 0  . aspirin 325 MG tablet Take 1 tablet (325 mg total) by mouth daily. 30 tablet 0  . atorvastatin (LIPITOR) 40 MG tablet Take 1 tablet (40 mg total) by mouth at bedtime. 30 tablet 0  . celecoxib (CELEBREX) 200 MG capsule TAKE ONE CAPSULE BY MOUTH EVERY DAY FOR HIP PAIN. DO NOT TAKE IBUPROFEN WHEN ON THIS  2  . FLUoxetine (PROZAC) 40 MG capsule Take 1 capsule (40 mg total) by mouth daily. 30 capsule 0  . fluticasone (FLONASE) 50 MCG/ACT nasal spray Place 1 spray into both nostrils daily as needed for allergies or rhinitis. 15.8 g 0  . glipiZIDE (GLUCOTROL) 10 MG tablet Take 10 mg by mouth 2 (two) times daily before a meal.   0  . insulin NPH Human (NOVOLIN N) 100 UNIT/ML injection Inject 12 Units into the skin 3 (three) times daily.    Miguel Diaz levETIRAcetam (KEPPRA) 750 MG tablet Take 2 tablets (1,500 mg total) by mouth 2 (two) times daily. 360 tablet 0  . lisinopril (PRINIVIL,ZESTRIL) 2.5 MG tablet Take 2.5  mg by mouth daily.  5  . LYRICA 150 MG capsule TAKE ONE CAPSULE BY MOUTH TWICE A DAY FOR PAIN  0  . meclizine (ANTIVERT) 25 MG tablet Take 1 tablet (25 mg total) by mouth 3 (three) times daily as needed for dizziness. 30 tablet 0  . metFORMIN (GLUCOPHAGE-XR) 500 MG 24 hr tablet Take 2 tablets (1,000 mg total) by mouth 2 (two) times daily. 120 tablet 0  . metoprolol succinate (TOPROL-XL) 50 MG 24 hr tablet Take 1 tablet (50 mg total) by mouth daily. Take with or immediately following a meal. 30 tablet 0  . Multiple Vitamins-Minerals (MULTIVITAMIN WITH MINERALS) tablet  Take 1 tablet by mouth daily.     No facility-administered medications prior to visit.    PAST MEDICAL HISTORY: Past Medical History:  Diagnosis Date  . Anxiety   . Asthma   . Blindness    bilateral  . Depression   . Diabetes mellitus without complication ([facility])   . Hypercholesteremia   . Neuropathy   . PSVT (paroxysmal supraventricular tachycardia) ([facility]) 06/2015  . Seizure ([facility])   . Stroke ([facility]) 02/2014   right sided weakness/numbness  . Vision loss of left eye     PAST SURGICAL HISTORY: Past Surgical History:  Procedure Laterality Date  . ABDOMINAL SURGERY    . EYE SURGERY Left   . HERNIA REPAIR      FAMILY HISTORY: Family History  Problem Relation Age of Onset  . High blood pressure Mother   . Diabetes Mellitus I Mother   . Congenital heart disease Mother   . High blood pressure Father   . Diabetes Mellitus I Father   . CAD Father 66  . Migraines Daughter   . Parkinsonism Daughter   . Parkinsonism Son   . High blood pressure Sister   . Congenital heart disease Sister   . High blood pressure Paternal Grandfather     SOCIAL HISTORY: Social History   Socioeconomic History  . Marital status: Significant Other    Spouse name: Not on file  . Number of children: 2  . Years of education: 9th  . Highest education level: Not on file  Occupational History  . Occupation: disabled  Tobacco Use  . Smoking status: Current Every Day Smoker    Packs/day: 1.00    Types: Cigarettes  . Smokeless tobacco: Never Used  Substance and Sexual Activity  . Alcohol use: Not Currently    Alcohol/week: 0.0 standard drinks  . Drug use: No  . Sexual activity: Not Currently  Other Topics Concern  . Not on file  Social History Narrative    Merged History Encounter    Patient  Lives with his girl friend   Patient is right handed   Patient coffee daily       Social Determinants of Health   Financial Resource Strain:   . Difficulty of Paying Living Expenses:   Food  Insecurity:   . Worried About Programme researcher, broadcasting/film/video in the Last Year:   . Barista in the Last Year:   Transportation Needs:   . Freight forwarder (Medical):   Miguel Diaz Lack of Transportation (Non-Medical):   Physical Activity:   . Days of Exercise per Week:   . Minutes of Exercise per Session:   Stress:   . Feeling of Stress :   Social Connections:   . Frequency of Communication with Friends and Family:   . Frequency of Social Gatherings with Friends and Family:   .  Attends Religious Services:   . Active Member of Clubs or Organizations:   . Attends Archivist Meetings:   Miguel Diaz Marital Status:   Intimate Partner Violence:   . Fear of Current or Ex-Partner:   . Emotionally Abused:   Miguel Diaz Physically Abused:   . Sexually Abused:       PHYSICAL EXAM  There were no vitals filed for this visit. There is no height or weight on file to calculate BMI.  Generalized: Well developed, in no acute distress   Neurological examination  Mentation: Alert oriented to time, place, history taking. Follows all commands speech and language fluent Cranial nerve II-XII: Pupils were equal round reactive to light. Extraocular movements were full, visual field were full on confrontational test. Facial sensation and strength were normal. Uvula tongue midline. Head turning and shoulder shrug  were normal and symmetric. Motor: The motor testing reveals 5 over 5 strength of all 4 extremities. Good symmetric motor tone is noted throughout.  Sensory: Sensory testing is intact to soft touch on all 4 extremities. No evidence of extinction is noted.  Coordination: Cerebellar testing reveals good finger-nose-finger and heel-to-shin bilaterally.  Gait and station: Gait is normal. Tandem gait is normal. Romberg is negative. No drift is seen.  Reflexes: Deep tendon reflexes are symmetric and normal bilaterally.   DIAGNOSTIC DATA (LABS, IMAGING, TESTING) - I reviewed patient records, labs, notes, testing  and imaging myself where available.  Lab Results  Component Value Date   WBC 6.7 12/26/2016   HGB 14.1 12/26/2016   HCT 41.0 12/26/2016   MCV 92.8 12/26/2016   PLT 125 (L) 12/26/2016      Component Value Date/Time   NA 138 12/26/2016 0401   K 3.2 (L) 12/26/2016 0401   CL 104 12/26/2016 0401   CO2 26 12/26/2016 0401   GLUCOSE 230 (H) 12/26/2016 0401   BUN 7 12/26/2016 0401   CREATININE 0.65 12/26/2016 0401   CALCIUM 8.3 (L) 12/26/2016 0401   PROT 7.4 12/24/2016 1352   ALBUMIN 4.5 12/24/2016 1352   AST 36 12/24/2016 1352   ALT 21 12/24/2016 1352   ALKPHOS 133 (H) 12/24/2016 1352   BILITOT 0.7 12/24/2016 1352   GFRNONAA >60 12/26/2016 0401   GFRAA >60 12/26/2016 0401   Lab Results  Component Value Date   CHOL 303 (H) 11/15/2016   HDL 32 (L) 11/15/2016   LDLCALC 194 (H) 11/15/2016   TRIG 385 (H) 11/15/2016   CHOLHDL 9.5 11/15/2016   Lab Results  Component Value Date   HGBA1C 14.0 (H) 11/15/2016   No results found for: VITAMINB12 Lab Results  Component Value Date   TSH 1.213 12/14/2015      ASSESSMENT AND PLAN 49 y.o. year old male  has a past medical history of Anxiety, Asthma, Blindness, Depression, Diabetes mellitus without complication ([place]), Hypercholesteremia, Neuropathy, PSVT (paroxysmal supraventricular tachycardia) ([place]) (06/2015), Seizure ([place]), Stroke ([place]) (02/2014), and Vision loss of left eye. here with ***   I spent 15 minutes with the patient. 50% of this time was spent   Butler Denmark, [place], DNP 10/03/2019, 5:40 AM [facility] [address] [place], [place] [address] [phone]

## 2020-02-23 ENCOUNTER — Other Ambulatory Visit: Payer: Self-pay | Admitting: Neurology

## 2020-04-24 ENCOUNTER — Ambulatory Visit: Payer: Medicare Other | Admitting: Neurology

## 2020-06-06 ENCOUNTER — Ambulatory Visit: Payer: Medicare Other | Admitting: Family Medicine

## 2020-08-10 ENCOUNTER — Other Ambulatory Visit: Payer: Self-pay | Admitting: Neurology

## 2020-08-11 MED ORDER — LEVETIRACETAM 750 MG PO TABS: 1500.0000 mg | ORAL_TABLET | Freq: Two times a day (BID) | ORAL | 0 refills | Status: DC

## 2020-08-11 NOTE — Telephone Encounter (Signed)
I will go ahead and refill the prescription.  This patient has a history of seizures, cannot stop the medication suddenly.  I will give him a 1 month supply, if he calls in for another refill he will only get 2 weeks supply, after that only a 1 week supply, until he comes in for revisit.

## 2020-09-15 ENCOUNTER — Other Ambulatory Visit: Payer: Self-pay | Admitting: Neurology

## 2020-09-15 NOTE — Telephone Encounter (Signed)
Per  Dr. Anne Hahn message from 08/10/20 regarding refill of Levetiracetam, patient received a 2 week supply.  Noted bottle and to pharmacy, patient must make appointment to continue to get refills.

## 2020-09-28 ENCOUNTER — Other Ambulatory Visit: Payer: Self-pay | Admitting: Neurology

## 2020-11-11 ENCOUNTER — Encounter: Payer: Self-pay | Admitting: Neurology

## 2020-11-13 ENCOUNTER — Encounter: Payer: Self-pay | Admitting: Neurology

## 2020-11-13 ENCOUNTER — Ambulatory Visit: Payer: Medicare Other | Admitting: Neurology

## 2020-11-13 ENCOUNTER — Telehealth: Payer: Self-pay

## 2020-11-13 VITALS — BP 97/68 | Ht 68.0 in | Wt 202.0 lb

## 2020-11-13 DIAGNOSIS — Z9989 Dependence on other enabling machines and devices: Secondary | ICD-10-CM

## 2020-11-13 DIAGNOSIS — G4733 Obstructive sleep apnea (adult) (pediatric): Secondary | ICD-10-CM | POA: Diagnosis not present

## 2020-11-13 DIAGNOSIS — G40909 Epilepsy, unspecified, not intractable, without status epilepticus: Secondary | ICD-10-CM | POA: Diagnosis not present

## 2020-11-13 NOTE — Telephone Encounter (Signed)
PAP re supply order has been sent to [facility].

## 2020-11-13 NOTE — Patient Instructions (Signed)
Please continue using your CPAP regularly. While your insurance requires that you use CPAP at least 4 hours each night on 70% of the nights, I recommend, that you not skip any nights and use it throughout the night if you can. Getting used to CPAP and staying with the treatment long term does take time and patience and discipline. Untreated obstructive sleep apnea when it is moderate to severe can have an adverse impact on cardiovascular health and raise her risk for heart disease, arrhythmias, hypertension, congestive heart failure, stroke and diabetes. Untreated obstructive sleep apnea causes sleep disruption, nonrestorative sleep, and sleep deprivation. This can have an impact on your day to day functioning and cause daytime sleepiness and impairment of cognitive function, memory loss, mood disturbance, and problems focussing. Using CPAP regularly can improve these symptoms.  Please remember, common seizure triggers are: Sleep deprivation, dehydration, overheating, stress, hypoglycemia or skipping meals, certain medications or excessive alcohol use, especially stopping alcohol abruptly if you have had heavy alcohol use before (aka alcohol withdrawal seizure). If you have a prolonged seizure over 2-5 minutes or back to back seizures, call or have someone call 911 or take you to the nearest emergency room. You cannot drive a car or operate any other machinery or vehicle within 6 months of a seizure. Please do not swim alone or take a tub bath for safety. Do not climb on ladders or be at heights alone. Do not cook with large quantities of boiling water or oil for safety. Please ensure the water temperature at home is not too high. When carrying or caring for small children and infants, make sure you sit down when holding the child are feeding the child or changing them to minimize risk for injury to the child are to you if you were to have a seizure.  Take your medicine for seizure prevention regularly and do not  skip doses or stop medication abruptly and tone are told to do so by your healthcare provider. Try to get a refill on your antiepileptic medication ahead of time, so you are not at risk of running out. If you run out of the seizure medication and do not have a refill at hand she may run into medication withdrawal seizures. Avoid taking Wellbutrin, narcotic pain medications and tramadol, as they can lower seizure threshold.  As per [facility] statutes, patients with seizures and epilepsy are not allowed to drive until they have been seizure-free for at least 6 months. This also applies to driving or using heavy equipment or power tools.  Please keep your appointment with Dr. Lucia Gaskins for seizure management as scheduled.  We can see you in sleep clinic in about a year, you can see one of our nurse practitioners, either Amy or Megan at the time.

## 2020-11-13 NOTE — Progress Notes (Signed)
Subjective:    Patient ID: Miguel Diaz. is a 50 y.o. male.  HPI     Interim history:   Mr. Miguel Diaz is a 50 year old right-handed gentleman with an underlying medical history of stroke, seizure disorder, neuropathy, diabetes, depression, asthma, anxiety, vision loss, hyperlipidemia, and obesity, who presents for follow-up consultation of his obstructive sleep apnea, on treatment with CPAP. The patient is unaccompanied today and presents after over 2 years. I last saw him in a VV on 10/19/2018, at which time he was compliant with CPAP, AHI was at goal, leak on the higher side but acceptable.  He was advised to follow-up in 1 year to see one of our nurse practitioners and schedule his routine follow-up for his seizures with Dr. Jaynee Eagles.  Today, 11/14/2019: I reviewed his CPAP compliance data from 10/13/2020 through 11/11/2020, which is a total of 30 days, during which time he used his machine 25 days with percent use days greater than 4 hours at 60%, indicating mildly suboptimal compliance with an average usage for days on treatment of 6 hours and 11 minutes, residual AHI borderline at 4.7/h, leak acceptable over the 95th percentile at 13.5 L/min on a pressure of 11 cm with EPR of 3.  In the past 90 days his compliance for usage was 62 out of 90 days with % used days greater than 4 hours at 54%.  He reports that he is generally compliant with treatment but lately he had some skip nights and longer work hours.  He has no problem using his CPAP.  He is typically up-to-date with his supplies through his DME company, [employer].  He uses a full facemask.  He reports compliance with his seizure medicine and has an appointment pending with Dr. Jaynee Eagles in about 3 months.  He does not drive.  His wife brought him for this appointment he states.  He reports his wife suspected he may have had a seizure in his sleep about 3 weeks ago.   The patient's allergies, current medications, family history, past medical  history, past social history, past surgical history and problem list were reviewed and updated as appropriate.    Previously:  I saw him on 05/10/2018, at which time we talked about his baseline sleep study from July 2019 and his subsequent CPAP titration study from August 2019. He was compliant with CPAP therapy. He was still adjusting to treatment and requested a mask change. He did notice improvement in his daytime somnolence and his wife endorsed that he was less sleepy during the day.    I reviewed his CPAP compliance data from 09/18/2018 through 10/17/2018 which is a total of 30 days, during which time he used his machine 29 days with percent used days greater than 4 hours at 80%, indicating very good compliance with an average usage of 6 hours and 9 minutes, residual AHI at goal at 1.4 per hour, leak on the higher end with the 95th percentile at 19.3 L/m on a pressure of 11 cm with EPR of 3.      I first met him on 12/13/17 at the request on [name], NP, at which time he reported snoring and daytime somnolence as well as witnessed apneas. He was advised to proceed with a sleep study. He had a baseline sleep study, followed by a CPAP titration study. I went over his test results with him in detail today. Baseline sleep study from 01/24/2018 showed a sleep efficiency of 76.8%, sleep latency of 27  minutes and REM latency delayed at 147.5 minutes. He had a reduced percentage of REM sleep and an increased percentage of light stage sleep. Total AHI was 20.4 per hour, REM AHI was 46.7 per hour, supine AHI 45.1 per hour. Average oxygen saturation was 92%, nadir was 86%. He had no significant PLMS. Based on his test results he was advised to proceed with a second sleep study for CPAP titration. He had this on 02/09/2018. Sleep efficiency was 82.4%, sleep latency 11.5 minutes, REM latency markedly delayed at 208 minutes. He was titrated via full face mask from 5 cm to 9 cm. On the final pressure his AHI  was 3.5 per hour with O2 nadir of 80% briefly. I suggested he proceed with CPAP therapy at home with a treatment pressure of 11 cm.   I reviewed his CPAP compliance data from 04/09/2018 through 05/08/2018 which is a total of 30 days, during which time he used his CPAP every night with percent used days greater than 4 hours at 97%, indicating excellent compliance with an average usage of 7 hours and 3 minutes, residual AHI at goal at 2.8 per hour, leak acceptable with the 95th percentile at 13.6 L/m on a pressure of 11 cm with EPR of 3.    12/13/2017: (He) reports snoring and excessive daytime somnolence. He has witnessed apneas, per wife's report. I reviewed your office note from 12/07/2017. He had a home sleep test many years ago but results are unknown. His Epworth sleepiness score is 19 out of 24, fatigue score is 59 out of 63.  He reports a family history of epilepsy. 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 He had reduced his smoking but is now back to one pack per day. He does not drink alcohol on a regular basis, last time he tried drinking some beer it tasted badly. He has diabetic neuropathy. 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 He denies nighttime nocturia or morning headaches. He does not drive. His wife has sleep apnea and uses CPAP successfully. He would be willing to consider CPAP therapy. Bedtime is around midnight and rises type varies, he has significant sleep disruption and gets out of bed around 10 or 11. He drinks caffeine in the form of half decaf coffee, 1 pot per day, he does not typically drink a whole lot of water he admits. He has some residual right-sided weakness from his stroke. He and his wife had to move in with her sister. There are 2 dogs in the household, mother also lives there.  His Past Medical History Is Significant For: Past Medical History:  Diagnosis Date  . Anxiety   . Asthma   . Blindness    bilateral  . Depression    . Diabetes mellitus without complication ([place])   . Hypercholesteremia   . Neuropathy   . PSVT (paroxysmal supraventricular tachycardia) ([place]) 06/2015  . Seizure ([place])   . Stroke ([place]) 02/2014   right sided weakness/numbness  . Vision loss of left eye     His Past Surgical History Is Significant For: Past Surgical History:  Procedure Laterality Date  . ABDOMINAL SURGERY    . EYE SURGERY Left   . HERNIA REPAIR      His Family History Is Significant For: Family History  Problem Relation Age of Onset  . High blood pressure Mother   . Diabetes Mellitus I Mother   . Congenital heart disease Mother   .  High blood pressure Father   . Diabetes Mellitus I Father   . CAD Father 64  . Migraines Daughter   . Parkinsonism Daughter   . Parkinsonism Son   . High blood pressure Sister   . Congenital heart disease Sister   . High blood pressure Paternal Grandfather     His Social History Is Significant For: Social History   Socioeconomic History  . Marital status: Significant Other    Spouse name: Not on file  . Number of children: 2  . Years of education: 9th  . Highest education level: Not on file  Occupational History  . Occupation: disabled  Tobacco Use  . Smoking status: Current Every Day Smoker    Packs/day: 1.00    Types: Cigarettes  . Smokeless tobacco: Never Used  Substance and Sexual Activity  . Alcohol use: Not Currently    Alcohol/week: 0.0 standard drinks  . Drug use: No  . Sexual activity: Not Currently  Other Topics Concern  . Not on file  Social History Narrative    Merged History Encounter    Patient  Lives with his girl friend   Patient is right handed   Patient coffee daily       Social Determinants of Health   Financial Resource Strain: Not on file  Food Insecurity: Not on file  Transportation Needs: Not on file  Physical Activity: Not on file  Stress: Not on file  Social Connections: Not on file    His Allergies Are:  Allergies   Allergen Reactions  . Penicillins Other (See Comments)    Unknown childhood reaction Has patient had a PCN reaction causing immediate rash, facial/tongue/throat swelling, SOB or lightheadedness with hypotension: Unknown Has patient had a PCN reaction causing severe rash involving mucus membranes or skin necrosis: Unknown Has patient had a PCN reaction that required hospitalization: Unknown Has patient had a PCN reaction occurring within the last 10 years: Unknown If all of the above answers are "NO", then may proceed with Cephalosporin use.   . Toradol [Ketorolac Tromethamine] Hives  :   His Current Medications Are:  Outpatient Encounter Medications as of 11/13/2020  Medication Sig  . albuterol (PROVENTIL HFA;VENTOLIN HFA) 108 (90 Base) MCG/ACT inhaler Inhale 2 puffs into the lungs every 6 (six) hours as needed for wheezing or shortness of breath.  Marland Kitchen aspirin 325 MG tablet Take 1 tablet (325 mg total) by mouth daily.  Marland Kitchen atorvastatin (LIPITOR) 40 MG tablet Take 1 tablet (40 mg total) by mouth at bedtime.  . celecoxib (CELEBREX) 200 MG capsule TAKE ONE CAPSULE BY MOUTH EVERY DAY FOR HIP PAIN. DO NOT TAKE IBUPROFEN WHEN ON THIS  . FLUoxetine (PROZAC) 40 MG capsule Take 1 capsule (40 mg total) by mouth daily.  . fluticasone (FLONASE) 50 MCG/ACT nasal spray Place 1 spray into both nostrils daily as needed for allergies or rhinitis.  Marland Kitchen glipiZIDE (GLUCOTROL) 10 MG tablet Take 10 mg by mouth 2 (two) times daily before a meal.   . insulin glargine (LANTUS) 100 UNIT/ML injection Inject 20 Units into the skin 2 (two) times daily.  Marland Kitchen levETIRAcetam (KEPPRA) 750 MG tablet Take 2 tablets (1,500 mg total) by mouth 2 (two) times daily. MUST MAKE APPT FOR ADDITIONAL REFILLS  . lisinopril (PRINIVIL,ZESTRIL) 2.5 MG tablet Take 2.5 mg by mouth daily.  Marland Kitchen LYRICA 150 MG capsule TAKE ONE CAPSULE BY MOUTH TWICE A DAY FOR PAIN  . meclizine (ANTIVERT) 25 MG tablet Take 1 tablet (25 mg total) by  mouth 3 (three) times  daily as needed for dizziness.  . metFORMIN (GLUCOPHAGE-XR) 500 MG 24 hr tablet Take 2 tablets (1,000 mg total) by mouth 2 (two) times daily.  . metoprolol succinate (TOPROL-XL) 50 MG 24 hr tablet Take 1 tablet (50 mg total) by mouth daily. Take with or immediately following a meal.  . Multiple Vitamins-Minerals (MULTIVITAMIN WITH MINERALS) tablet Take 1 tablet by mouth daily.  . [DISCONTINUED] insulin NPH Human (NOVOLIN N) 100 UNIT/ML injection Inject 12 Units into the skin 3 (three) times daily. (Patient not taking: Reported on 11/13/2020)   No facility-administered encounter medications on file as of 11/13/2020.  :  Review of Systems:  Out of a complete 14 point review of systems, all are reviewed and negative with the exception of these symptoms as listed below: Review of Systems  Neurological:       Here for f/u on CPAP. Reports he is doing ok with machine.    Objective:  Neurological Exam  Physical Exam Physical Examination:   Vitals:   11/13/20 1027  BP: 97/68    General Examination: The patient is a very pleasant 50 y.o. male in no acute distress. He appears well-developed and well-nourished and well groomed.   HEENT:Normocephalic, atraumatic, L eye blindness, visually impaired on the R, with thick eye glasses in place.Extraocular tracking is fairly well-preserved, no obvious nystagmus, hearing is grossly intact, face is symmetric.  Speech is clear without dysarthria, hypophonia or voice tremor, airway examination reveals mild mouth dryness and stable findings, tongue protrudes centrally and palate elevates symmetrically.   Chest:Clear to auscultation without wheezing, rhonchi or crackles noted.  Heart:S1+S2+0, regular and normal without murmurs, rubs or gallops noted.   Abdomen:Soft, non-tender and non-distended.  Extremities:There isnopitting edema in the distal lower extremities bilaterally.   Skin: Warm and dry without trophic changes  noted.  Musculoskeletal: exam reveals no obvious joint deformities, tenderness or joint swelling.  Neurologically:  Mental status: The patient is awake, alert and oriented in all 4 spheres.Hisimmediate and remote memory, attention, language skills and fund of knowledge are appropriate. There is no evidence of aphasia, agnosia, apraxia or anomia. Speech is clear with normal prosody and enunciation. Thought process is linear. Mood is normaland affect is normal.  Cranial nerves II - XII are as described above under HEENT exam. Motor exam: Normal bulk, strength and tone is noted, stable, mild R hip flexor weakness. There is no drift, tremor or rebound. Fine motor skills and coordination:grosslyintact.  Cerebellar testing: No dysmetria or intention tremor.  Sensory exam: intact to light touch in the upper and lower extremities.  Gait, station and balance:Hestands easily. No veering to one side is noted. No leaning to one side is noted. Posture is age-appropriate and stance is narrow based. Gait showsnormalstride length and normalpace. No problems turning are noted.  No walking aid.    Assessmentand Plan:   [place] a 50 year old malewith an underlying medical history of stroke, seizure disorder, neuropathy, diabetes, depression, asthma, anxiety, vision impairment, hyperlipidemia, and obesity, who presents for follow-up consultation of his obstructive sleep apnea, which was determined to be in the moderate range with a baseline sleep study in July 2019.  He had a subsequent CPAP titration study in August 2019.  He has been compliant with CPAP but in the past 3 months he has had a mild decline in his usage.  He is encouraged to be fully compliant with treatment.  He reports a possible nocturnal seizure some 3  weeks ago.  He reports compliance with his seizure medicine Keppra, he has an appointment pending with Dr. Jaynee Eagles for checkup in about 3 months.  He is advised  regarding seizure precautions and advised not to drive, work with power tools or dangerous equipment or be at heights.  He demonstrated understanding and agreement.  He is advised to continue with his CPAP and I placed an order for his supplies.  We will send this to his DME company.  He is advised to follow-up in sleep clinic routinely to see one of our nurse practitioners in 1 year, sooner if needed.  I answered all his questions today and he was in agreement.I spent 30 minutes in total face-to-face time and in reviewing records during pre-charting, more than 50% of which was spent in counseling and coordination of care, reviewing test results, reviewing medications and treatment regimen and/or in discussing or reviewing the diagnosis of OSA, seizure d/o, the prognosis and treatment options. Pertinent laboratory and imaging test results that were available during this visit with the patient were reviewed by me and considered in my medical decision making (see chart for details).

## 2020-12-11 DIAGNOSIS — G4733 Obstructive sleep apnea (adult) (pediatric): Secondary | ICD-10-CM | POA: Insufficient documentation

## 2020-12-11 NOTE — Progress Notes (Deleted)
Cardiology Office Note   Date:  12/11/2020   ID:  Miguel Coe., DOB October 12, 1970, MRN [id number]  PCP:  Suzan Slick, MD  Cardiologist:   Rollene Rotunda, MD Referring:  ***  No chief complaint on file.     History of Present Illness: Miguel Peterka. is a 50 y.o. male who presents for follow up of oronary artery disease without hemodynamically significant stenoses by coronary CT angiography performed in May 2018. He presented at that time with atypical chest discomfort and coronary CTA showed a calcium score at 94th percentile for age/gender, 50-69% stenosis in the mid LAD and scattered lesions throughout all 3 major coronary arteries.  ***   *** He is completely blind in his right eye and has tunnel vision in his left eye.   He has a long history of tobacco abuse and smokes about a pack of cigarettes daily.   He denies any significant exertional chest pain, palpitations and shortness of breath.  He has dizziness sometimes when he stands up too quickly.   He previously told me he has a history of a minor stroke in 2015.   He continues to smoke 1 pack of cigarettes daily.  Past Medical History:  Diagnosis Date   Anxiety    Asthma    Blindness    bilateral   Depression    Diabetes mellitus without complication ([facility])    Hypercholesteremia    Neuropathy    PSVT (paroxysmal supraventricular tachycardia) ([facility]) 06/2015   Seizure ([facility])    Stroke ([facility]) 02/2014   right sided weakness/numbness   Vision loss of left eye     Past Surgical History:  Procedure Laterality Date   ABDOMINAL SURGERY     EYE SURGERY Left    HERNIA REPAIR       Current Outpatient Medications  Medication Sig Dispense Refill   albuterol (PROVENTIL HFA;VENTOLIN HFA) 108 (90 Base) MCG/ACT inhaler Inhale 2 puffs into the lungs every 6 (six) hours as needed for wheezing or shortness of breath. 2 Inhaler 0   aspirin 325 MG tablet Take 1 tablet (325 mg total) by mouth daily. 30 tablet 0    atorvastatin (LIPITOR) 40 MG tablet Take 1 tablet (40 mg total) by mouth at bedtime. 30 tablet 0   celecoxib (CELEBREX) 200 MG capsule TAKE ONE CAPSULE BY MOUTH EVERY DAY FOR HIP PAIN. DO NOT TAKE IBUPROFEN WHEN ON THIS  2   FLUoxetine (PROZAC) 40 MG capsule Take 1 capsule (40 mg total) by mouth daily. 30 capsule 0   fluticasone (FLONASE) 50 MCG/ACT nasal spray Place 1 spray into both nostrils daily as needed for allergies or rhinitis. 15.8 g 0   glipiZIDE (GLUCOTROL) 10 MG tablet Take 10 mg by mouth 2 (two) times daily before a meal.   0   insulin glargine (LANTUS) 100 UNIT/ML injection Inject 20 Units into the skin 2 (two) times daily.     levETIRAcetam (KEPPRA) 750 MG tablet Take 2 tablets (1,500 mg total) by mouth 2 (two) times daily. MUST MAKE APPT FOR ADDITIONAL REFILLS 56 tablet 0   lisinopril (PRINIVIL,ZESTRIL) 2.5 MG tablet Take 2.5 mg by mouth daily.  5   LYRICA 150 MG capsule TAKE ONE CAPSULE BY MOUTH TWICE A DAY FOR PAIN  0   meclizine (ANTIVERT) 25 MG tablet Take 1 tablet (25 mg total) by mouth 3 (three) times daily as needed for dizziness. 30 tablet 0   metFORMIN (GLUCOPHAGE-XR) 500 MG 24 hr  tablet Take 2 tablets (1,000 mg total) by mouth 2 (two) times daily. 120 tablet 0   metoprolol succinate (TOPROL-XL) 50 MG 24 hr tablet Take 1 tablet (50 mg total) by mouth daily. Take with or immediately following a meal. 30 tablet 0   Multiple Vitamins-Minerals (MULTIVITAMIN WITH MINERALS) tablet Take 1 tablet by mouth daily.     No current facility-administered medications for this visit.    Allergies:   Penicillins and Toradol [ketorolac tromethamine]    ROS:  Please see the history of present illness.   Otherwise, review of systems are positive for {NONE DEFAULTED:18576}.   All other systems are reviewed and negative.    PHYSICAL EXAM: VS:  There were no vitals taken for this visit. , BMI There is no height or weight on file to calculate BMI. GENERAL:  Well appearing NECK:  No  jugular venous distention, waveform within normal limits, carotid upstroke brisk and symmetric, no bruits, no thyromegaly LUNGS:  Clear to auscultation bilaterally CHEST:  Unremarkable HEART:  PMI not displaced or sustained,S1 and S2 within normal limits, no S3, no S4, no clicks, no rubs, *** murmurs ABD:  Flat, positive bowel sounds normal in frequency in pitch, no bruits, no rebound, no guarding, no midline pulsatile mass, no hepatomegaly, no splenomegaly EXT:  2 plus pulses throughout, no edema, no cyanosis no clubbing    ***GENERAL:  Well appearingHEENT:  Pupils equal round and reactive, fundi not visualized, oral mucosa unremarkable NECK:  No jugular venous distention, waveform within normal limits, carotid upstroke brisk and symmetric, no bruits, no thyromegaly LYMPHATICS:  No cervical, inguinal adenopathy LUNGS:  Clear to auscultation bilaterally BACK:  No CVA tenderness CHEST:  Unremarkable HEART:  PMI not displaced or sustained,S1 and S2 within normal limits, no S3, no S4, no clicks, no rubs, *** murmurs ABD:  Flat, positive bowel sounds normal in frequency in pitch, no bruits, no rebound, no guarding, no midline pulsatile mass, no hepatomegaly, no splenomegaly EXT:  2 plus pulses throughout, no edema, no cyanosis no clubbing SKIN:  No rashes no nodules NEURO:  Cranial nerves II through XII grossly intact, motor grossly intact throughout PSYCH:  Cognitively intact, oriented to person place and time    EKG:  EKG {ACTION; IS/IS TIW:58099833} ordered today. The ekg ordered today demonstrates ***   Recent Labs: No results found for requested labs within last 8760 hours.    Lipid Panel    Component Value Date/Time   CHOL 303 (H) 11/15/2016 0639   TRIG 385 (H) 11/15/2016 0639   HDL 32 (L) 11/15/2016 0639   CHOLHDL 9.5 11/15/2016 0639   VLDL 77 (H) 11/15/2016 0639   LDLCALC 194 (H) 11/15/2016 0639      Wt Readings from Last 3 Encounters:  11/13/20 202 lb (91.6 kg)   06/07/19 205 lb (93 kg)  05/10/18 215 lb (97.5 kg)      Other studies Reviewed: Additional studies/ records that were reviewed today include: ***. Review of the above records demonstrates:  Please see elsewhere in the note.  ***   ASSESSMENT AND PLAN:  Coronary artery disease:  ***   Symptomatically stable.  At very high risk of progression and adverse cardiac events. No ischemia by nuclear stress test June 2017, no high-grade stenosis by coronary CT angiography May 2018. On aspirin (325 mg for history of TIA), atorvastatin and metoprolol succinate.   Hyperlipidemia: LDL was ***  normal at 72 in June 2020.    Continue atorvastatin 40 mg.  Type 2 diabetes mellitus:    ***  Currently on metformin, Lantus and glipizide.  I would recommend an SGLT2 inhibitor given his significantly elevated cardiac risk.  He is on atorvastatin 40 mg.   Tobacco abuse:   ***  Not a good candidate for Wellbutrin or Chantix given a history of seizures.  Nicotine replacement products can be utilized.  It appears he has no desire to quit.   Obstructive sleep apnea: ***  Uses CPAP.   History of TIA: ***   On aspirin (325 mg) and atorvastatin   Current medicines are reviewed at length with the patient today.  The patient {ACTIONS; HAS/DOES NOT HAVE:19233} concerns regarding medicines.  The following changes have been made:  {PLAN; NO CHANGE:13088:s}  Labs/ tests ordered today include: *** No orders of the defined types were placed in this encounter.    Disposition:   FU with ***    Signed, Rollene Rotunda, MD  12/11/2020 6:41 PM    [facility]

## 2020-12-12 ENCOUNTER — Ambulatory Visit: Payer: Medicare Other | Admitting: Cardiology

## 2020-12-17 ENCOUNTER — Ambulatory Visit: Payer: Medicare Other | Admitting: Cardiology

## 2021-02-18 ENCOUNTER — Ambulatory Visit: Payer: Medicare Other | Admitting: Neurology

## 2021-03-12 ENCOUNTER — Encounter (INDEPENDENT_AMBULATORY_CARE_PROVIDER_SITE_OTHER): Payer: Self-pay | Admitting: *Deleted

## 2021-03-15 NOTE — Progress Notes (Deleted)
Cardiology Office Note  Date: 03/15/2021   ID: Miguel Coe., DOB August 11, 1970, [name]  PCP:  Miguel Slick, MD  Cardiologist:  Miguel Rotunda, MD Electrophysiologist:  None   Chief Complaint: Status post hospital follow up.   History of Present Illness: Miguel Diaz. is a 50 y.o. male with a history of PSVT, HLD, DM2, CVA/TIA, seizure, blindness, HTN, superior vena cava syndrome., orthostatic hypotension, sleep apnea.  History of stents and CAD without hemodynamically significant stenosis by coronary CT angiography May 2019.  Previous history of atypical chest discomfort.  Coronary CTA CAC score 94 percentile for age/gender.  50 to 69% stenosis in mid LAD and scattered lesions throughout all 3 major coronary arteries.  Previously seen by Miguel Diaz 2020.  He denies any exertional chest pain, palpitations, shortness of breath.  He had occasional transient dizziness when standing too quickly.  Describes history of minor stroke 2015.  He was continuing to smoke 1 pack of cigarettes per day.  Described blindness in right arm and tunnel vision in his left eye.  He was continuing atorvastatin.  He was currently on metformin and Lantus along with glipizide.  Recommended SGLT2 inhibitor given significantly elevated cardiac risk.  He was not a good candidate for Wellbutrin or Chantix given history of seizures.  He had no desire to quit smoking at that time.  He was using his CPAP for OSA.  He was continuing full dose aspirin for history of TIA/CVA.  He had a recent admission to [facility] after presenting to [facility] ED on 02/26/2021 for abdominal pain and hematemesis/coffee-ground emesis.  He was transferred by air care and admitted to medical ICU for further management of acute GI bleed.  His hemoglobin was 10.4 down from baseline of 14.  Platelets were 161, WBC 10.9, INR 0.91, lactic acid 2.9, sodium 138, BUN 18, creatinine 1.13.  He was given 2 units of  packed red blood cells.  CTA was negative for GI bleed. CT Stomach containing hyperdense material consistent with thrombus, mild fatty liver disease, normal spleen, normal pancreas.  Hemoglobin on presentation to [facility] was 12.9.  He underwent EGD on 02/27/2021 which showed old blood loss in stomach with no active bleeding and no or source of bleeding located.   Mentioned he could have a dieulafoy lesion or nasopharyngeal source. He was on octreotide but this was discontinued due to no evidence of varices on EGD.  It  was recommended he continue PPI.  Hemoglobin was monitored and was 9.3 prior to discharge which was slightly lower than prior but without further signs of bleeding.  Past Medical History:  Diagnosis Date   Anxiety    Asthma    Blindness    bilateral   Depression    Diabetes mellitus without complication (HCC)    Hypercholesteremia    Neuropathy    PSVT (paroxysmal supraventricular tachycardia) (HCC) 06/2015   Seizure (HCC)    Stroke (HCC) 02/2014   right sided weakness/numbness   Vision loss of left eye     Past Surgical History:  Procedure Laterality Date   ABDOMINAL SURGERY     EYE SURGERY Left    HERNIA REPAIR      Current Outpatient Medications  Medication Sig Dispense Refill   albuterol (PROVENTIL HFA;VENTOLIN HFA) 108 (90 Base) MCG/ACT inhaler Inhale 2 puffs into the lungs every 6 (six) hours as needed for wheezing or shortness of breath. 2 Inhaler 0  aspirin 325 MG tablet Take 1 tablet (325 mg total) by mouth daily. 30 tablet 0   atorvastatin (LIPITOR) 40 MG tablet Take 1 tablet (40 mg total) by mouth at bedtime. 30 tablet 0   celecoxib (CELEBREX) 200 MG capsule TAKE ONE CAPSULE BY MOUTH EVERY DAY FOR HIP PAIN. DO NOT TAKE IBUPROFEN WHEN ON THIS  2   FLUoxetine (PROZAC) 40 MG capsule Take 1 capsule (40 mg total) by mouth daily. 30 capsule 0   fluticasone (FLONASE) 50 MCG/ACT nasal spray Place 1 spray into both nostrils daily as needed for allergies or  rhinitis. 15.8 g 0   glipiZIDE (GLUCOTROL) 10 MG tablet Take 10 mg by mouth 2 (two) times daily before a meal.   0   insulin glargine (LANTUS) 100 UNIT/ML injection Inject 20 Units into the skin 2 (two) times daily.     levETIRAcetam (KEPPRA) 750 MG tablet Take 2 tablets (1,500 mg total) by mouth 2 (two) times daily. MUST MAKE APPT FOR ADDITIONAL REFILLS 56 tablet 0   lisinopril (PRINIVIL,ZESTRIL) 2.5 MG tablet Take 2.5 mg by mouth daily.  5   LYRICA 150 MG capsule TAKE ONE CAPSULE BY MOUTH TWICE A DAY FOR PAIN  0   meclizine (ANTIVERT) 25 MG tablet Take 1 tablet (25 mg total) by mouth 3 (three) times daily as needed for dizziness. 30 tablet 0   metFORMIN (GLUCOPHAGE-XR) 500 MG 24 hr tablet Take 2 tablets (1,000 mg total) by mouth 2 (two) times daily. 120 tablet 0   metoprolol succinate (TOPROL-XL) 50 MG 24 hr tablet Take 1 tablet (50 mg total) by mouth daily. Take with or immediately following a meal. 30 tablet 0   Multiple Vitamins-Minerals (MULTIVITAMIN WITH MINERALS) tablet Take 1 tablet by mouth daily.     No current facility-administered medications for this visit.   Allergies:  Penicillins and Toradol [ketorolac tromethamine]   Social History: The patient  reports that he has been smoking cigarettes. He has been smoking an average of 1 pack per day. He has never used smokeless tobacco. He reports that he does not currently use alcohol. He reports that he does not use drugs.   Family History: The patient's family history includes CAD (age of onset: 86) in his father; Congenital heart disease in his mother and sister; Diabetes Mellitus I in his father and mother; High blood pressure in his father, mother, paternal grandfather, and sister; Migraines in his daughter; Parkinsonism in his daughter and son.   ROS:  Please see the history of present illness. Otherwise, complete review of systems is positive for {NONE DEFAULTED:18576}.  All other systems are reviewed and negative.   Physical  Exam: VS:  There were no vitals taken for this visit., BMI There is no height or weight on file to calculate BMI.  Wt Readings from Last 3 Encounters:  11/13/20 202 lb (91.6 kg)  06/07/19 205 lb (93 kg)  05/10/18 215 lb (97.5 kg)    General: Patient appears comfortable at rest. HEENT: Conjunctiva and lids normal, oropharynx clear with moist mucosa. Neck: Supple, no elevated JVP or carotid bruits, no thyromegaly. Lungs: Clear to auscultation, nonlabored breathing at rest. Cardiac: Regular rate and rhythm, no S3 or significant systolic murmur, no pericardial rub. Abdomen: Soft, nontender, no hepatomegaly, bowel sounds present, no guarding or rebound. Extremities: No pitting edema, distal pulses 2+. Skin: Warm and dry. Musculoskeletal: No kyphosis. Neuropsychiatric: Alert and oriented x3, affect grossly appropriate.  ECG:  {EKG/Telemetry Strips Reviewed:[id number]}  Recent Labwork: No  results found for requested labs within last 8760 hours.     Component Value Date/Time   CHOL 303 (H) 11/15/2016 0639   TRIG 385 (H) 11/15/2016 0639   HDL 32 (L) 11/15/2016 0639   CHOLHDL 9.5 11/15/2016 0639   VLDL 77 (H) 11/15/2016 0639   LDLCALC 194 (H) 11/15/2016 1497    Other Studies Reviewed Today:  12/15/2015 Stress test IMPRESSION: 1. No reversible ischemia or infarction.   2. Normal left ventricular wall motion.   3. Left ventricular ejection fraction 53%   4. Non invasive risk stratification*: Low     Echocardiogram 11/15/2016 Study Conclusions   - Left ventricle: The cavity size was normal. Wall thickness was    normal. Systolic function was normal. The estimated ejection    fraction was in the range of 50% to 55%. Wall motion was normal;    there were no regional wall motion abnormalities. Left    ventricular diastolic function parameters were normal.  - Aortic valve: Mildly thickened, mildly calcified leaflets. There    was trivial regurgitation.  - Mitral valve:  Calcified annulus.  - Inferior vena cava: The vessel was normal in size. The    respirophasic diameter changes were in the normal range (= 50%),    consistent with normal central venous pressure.   Assessment and Plan:  1. CAD in native artery   2. Mixed hyperlipidemia due to type 2 diabetes mellitus (HCC)   3. DM type 2 with diabetic peripheral neuropathy (HCC)   4. Tobacco abuse   5. OSA on CPAP   6. History of CVA (cerebrovascular accident)      Medication Adjustments/Labs and Tests Ordered: Current medicines are reviewed at length with the patient today.  Concerns regarding medicines are outlined above.   Disposition: Follow-up with ***  Signed, Rennis Harding, NP 03/15/2021 7:56 PM    [facility] at [facility] [address] [place], [place], [place] [address] Phone: [phone]; Fax: [phone]

## 2021-03-16 ENCOUNTER — Ambulatory Visit: Payer: Medicare Other | Admitting: Family Medicine

## 2021-03-16 DIAGNOSIS — Z8673 Personal history of transient ischemic attack (TIA), and cerebral infarction without residual deficits: Secondary | ICD-10-CM

## 2021-03-16 DIAGNOSIS — E1142 Type 2 diabetes mellitus with diabetic polyneuropathy: Secondary | ICD-10-CM

## 2021-03-16 DIAGNOSIS — Z720 Tobacco use: Secondary | ICD-10-CM

## 2021-03-16 DIAGNOSIS — E782 Mixed hyperlipidemia: Secondary | ICD-10-CM

## 2021-03-16 DIAGNOSIS — G4733 Obstructive sleep apnea (adult) (pediatric): Secondary | ICD-10-CM

## 2021-03-16 DIAGNOSIS — I2510 Atherosclerotic heart disease of native coronary artery without angina pectoris: Secondary | ICD-10-CM

## 2021-03-22 NOTE — Progress Notes (Signed)
Cardiology Office Note  Date: 03/23/2021   [name], DOB Aug 02, 1970, MRN [id number]  PCP:  Suzan Slick, MD  Cardiologist:  Rollene Rotunda, MD Electrophysiologist:  None   Chief Complaint: Status post hospital follow up.   History of Present Illness: Garv Kuechle. is a 50 y.o. male with a history of PSVT, HLD, DM2, CVA/TIA, seizure, blindness, HTN, superior vena cava syndrome., orthostatic hypotension, sleep apnea.  History of stents and CAD without hemodynamically significant stenosis by coronary CT angiography May 2019.  Previous history of atypical chest discomfort.  Coronary CTA CAC score 94 percentile for age/gender.  50 to 69% stenosis in mid LAD and scattered lesions throughout all 3 major coronary arteries.  Previously seen by Dr. Purvis Sheffield 2020.  He denied any exertional chest pain, palpitations, shortness of breath.  He had occasional transient dizziness when standing too quickly.  Described history of minor stroke 2015.  He was continuing to smoke 1 pack of cigarettes per day.  Described blindness in right eye and tunnel vision in his left eye.  He was continuing atorvastatin.  He was currently on metformin and Lantus along with glipizide.  Recommended SGLT2 inhibitor given significantly elevated cardiac risk.  He was not a good candidate for Wellbutrin or Chantix given history of seizures.  He had no desire to quit smoking at that time.  He was using his CPAP for OSA.  He was continuing full dose aspirin for history of TIA/CVA.  He had a recent admission to [facility] after presenting to [facility] ED on 02/26/2021 for abdominal pain and hematemesis/coffee-ground emesis.  He was transferred by air care and admitted to medical ICU for further management of acute GI bleed.  His hemoglobin was 10.4 down from baseline of 14.  Platelets were 161, WBC 10.9, INR 0.91, lactic acid 2.9, sodium 138, BUN 18, creatinine 1.13.  He was given 2 units of  packed red blood cells.  CTA was negative for GI bleed. CT Stomach containing hyperdense material consistent with thrombus, mild fatty liver disease, normal spleen, normal pancreas.  Hemoglobin on presentation to [facility] was 12.9.  He underwent EGD on 02/27/2021 which showed old blood loss in stomach with no active bleeding and no or source of bleeding located.   Mentioned he could have a dieulafoy lesion or nasopharyngeal source. He was on octreotide but this was discontinued due to no evidence of varices on EGD.  It  was recommended he continue PPI.  Hemoglobin was monitored and was 9.3 prior to discharge which was slightly lower than prior but without further signs of bleeding.  He is here today for follow-up.  He has no complaints.  He denies any further bleeding issues.  States he has an upcoming colonoscopy with GI in [place].  He has had no hematemesis or hematochezia/melena.  Denies any DOE or SOB.  Blood pressure is on the low side but he states this is normal for him.  BP is 98/60.  Heart rate is 104, EKG shows sinus tachycardia with a rate of 104, T wave abnormality, consider inferior ischemia.  Patient states he smoked a cigarette on the way to clinic today.  Denies any anginal symptoms, PND, orthopnea, orthostatic symptoms, CVA or TIA-like symptoms.  Denies any palpitations or arrhythmias.  No claudication-like symptoms, DVT or PE-like symptoms, or lower extremity edema.  He states he has had episodes of SVT in the past with having to receive adenosine on  a couple of occasions.  No recent episodes.   Past Medical History:  Diagnosis Date   Anxiety    Asthma    Blindness    bilateral   Depression    Diabetes mellitus without complication (HCC)    Hypercholesteremia    Neuropathy    PSVT (paroxysmal supraventricular tachycardia) (HCC) 06/2015   Seizure (HCC)    Stroke (HCC) 02/2014   right sided weakness/numbness   Vision loss of left eye     Past Surgical History:   Procedure Laterality Date   ABDOMINAL SURGERY     EYE SURGERY Left    HERNIA REPAIR      Current Outpatient Medications  Medication Sig Dispense Refill   albuterol (PROVENTIL HFA;VENTOLIN HFA) 108 (90 Base) MCG/ACT inhaler Inhale 2 puffs into the lungs every 6 (six) hours as needed for wheezing or shortness of breath. 2 Inhaler 0   aspirin 81 MG EC tablet Take 1 tablet by mouth daily.     atorvastatin (LIPITOR) 40 MG tablet Take 1 tablet (40 mg total) by mouth at bedtime. 30 tablet 0   cyclobenzaprine (FLEXERIL) 10 MG tablet Take 10 mg by mouth as needed.     FLUoxetine (PROZAC) 40 MG capsule Take 1 capsule (40 mg total) by mouth daily. 30 capsule 0   fluticasone (FLONASE) 50 MCG/ACT nasal spray Place 1 spray into both nostrils daily as needed for allergies or rhinitis. 15.8 g 0   glipiZIDE (GLUCOTROL) 10 MG tablet Take 10 mg by mouth 2 (two) times daily before a meal.   0   insulin glargine (LANTUS) 100 UNIT/ML injection Inject 20 Units into the skin 2 (two) times daily.     levETIRAcetam (KEPPRA) 750 MG tablet Take 2 tablets (1,500 mg total) by mouth 2 (two) times daily. MUST MAKE APPT FOR ADDITIONAL REFILLS 56 tablet 0   lisinopril (PRINIVIL,ZESTRIL) 2.5 MG tablet Take 2.5 mg by mouth daily.  5   LYRICA 150 MG capsule TAKE ONE CAPSULE BY MOUTH TWICE A DAY FOR PAIN  0   meclizine (ANTIVERT) 25 MG tablet Take 1 tablet (25 mg total) by mouth 3 (three) times daily as needed for dizziness. 30 tablet 0   metFORMIN (GLUCOPHAGE) 1000 MG tablet Take 1,000 mg by mouth 2 (two) times daily.     metoprolol succinate (TOPROL-XL) 50 MG 24 hr tablet Take 1 tablet (50 mg total) by mouth daily. Take with or immediately following a meal. 30 tablet 0   Multiple Vitamins-Minerals (MULTIVITAMIN WITH MINERALS) tablet Take 1 tablet by mouth daily.     pantoprazole (PROTONIX) 40 MG tablet Take 40 mg by mouth daily.     No current facility-administered medications for this visit.   Allergies:  Penicillins and  Toradol [ketorolac tromethamine]   Social History: The patient  reports that he has been smoking cigarettes. He has been smoking an average of 1 pack per day. He has never used smokeless tobacco. He reports that he does not currently use alcohol. He reports that he does not use drugs.   Family History: The patient's family history includes CAD (age of onset: 78) in his father; Congenital heart disease in his mother and sister; Diabetes Mellitus I in his father and mother; High blood pressure in his father, mother, paternal grandfather, and sister; Migraines in his daughter; Parkinsonism in his daughter and son.   ROS:  Please see the history of present illness. Otherwise, complete review of systems is positive for none.  All other systems  are reviewed and negative.   Physical Exam: VS:  BP 98/60   Pulse (!) 104   Ht 5\' 8"  (1.727 m)   Wt 202 lb 3.2 oz (91.7 kg)   SpO2 97%   BMI 30.74 kg/m , BMI Body mass index is 30.74 kg/m.  Wt Readings from Last 3 Encounters:  03/23/21 202 lb 3.2 oz (91.7 kg)  11/13/20 202 lb (91.6 kg)  06/07/19 205 lb (93 kg)    General: Patient appears comfortable at rest. Neck: Supple, no elevated JVP or carotid bruits, no thyromegaly. Lungs: Clear to auscultation, nonlabored breathing at rest. Cardiac: Regular rate and rhythm, no S3 or significant systolic murmur, no pericardial rub. Extremities: No pitting edema, distal pulses 2+. Skin: Warm and dry. Musculoskeletal: No kyphosis. Neuropsychiatric: Alert and oriented x3, affect grossly appropriate.  ECG: March 23, 2021 EKG sinus tachycardia with a rate of 104.  T wave abnormality, consider inferior ischemia.  Recent Labwork: No results found for requested labs within last 8760 hours.     Component Value Date/Time   CHOL 303 (H) 11/15/2016 0639   TRIG 385 (H) 11/15/2016 0639   HDL 32 (L) 11/15/2016 0639   CHOLHDL 9.5 11/15/2016 0639   VLDL 77 (H) 11/15/2016 0639   LDLCALC 194 (H) 11/15/2016 11/17/2016     Other Studies Reviewed Today:  12/15/2015 Stress test IMPRESSION: 1. No reversible ischemia or infarction.   2. Normal left ventricular wall motion.   3. Left ventricular ejection fraction 53%   4. Non invasive risk stratification*: Low     Echocardiogram 11/15/2016 Study Conclusions   - Left ventricle: The cavity size was normal. Wall thickness was    normal. Systolic function was normal. The estimated ejection    fraction was in the range of 50% to 55%. Wall motion was normal;    there were no regional wall motion abnormalities. Left    ventricular diastolic function parameters were normal.  - Aortic valve: Mildly thickened, mildly calcified leaflets. There    was trivial regurgitation.  - Mitral valve: Calcified annulus.  - Inferior vena cava: The vessel was normal in size. The    respirophasic diameter changes were in the normal range (= 50%),    consistent with normal central venous pressure.      EXAM: Cardiac/Coronary  CT 11/15/2016   TECHNIQUE: The patient was scanned on a Philips 256 scanner.   FINDINGS: A 120 kV prospective scan was triggered in the descending thoracic aorta at 111 HU's. Axial non-contrast 3 mm slices were carried out through the heart. The data set was analyzed on a dedicated work station and scored using the Agatson method. Gantry rotation speed was 270 msecs and collimation was .9 mm. 5 mg of iv Metoprolol and 0.8 mg of sl NTG was given. The 3D data set was reconstructed in 5% intervals of the 67-82 % of the R-R cycle. Diastolic phases were analyzed on a dedicated work station using MPR, MIP and VRT modes. The patient received 80 cc of contrast.   Aorta:  Normal size, no calcifications, no dissection.   Aortic Valve:  Trileaflet.  No calcifications.   Coronary Arteries:  Normal coronary origin.  Right dominance.   Left main is a large caliber vessel that gives rise to LAD and LCX arteries and has no plaque.   LAD is a long  vessel that has diffuse calcified plaque in the proximal portion associated with 25-50% and moderate mixed plaque in the mid portion associated with 50-69%  stenosis. Distal LAD is poorly opacified. D1 is a small vessel.   LCX artery is a medium caliber non-dominant vessel that gives rise to two obtuse marginal branches and has mild calcified plaque associated with 25-50% stenosis.   RCA is a medium size vessel that gives rise to PDA that is poorly opacified, PLA is not visualized. Proximal RCA has mild calcified plaque associated with 25-50% stenosis, mid to distal RCA has a small lumen.   Other findings:   Normal pulmonary vein drainage into the left atrium.   Normal left atrial appendage with no evidence of a thrombus.   No ASD/VSD.   Normal size of the pulmonary artery.   IMPRESSION: 1. Coronary calcium score of 75. This was 56 percentile for age and sex matched control.   2. Normal coronary origin. Right dominance. Limited study quality sec to suboptimal vessel dilatation.   3. 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 An aggressive risk factor modification is recommended.       Assessment and Plan:  1. CAD in native artery   2. Essential hypertension, benign   3. Mixed hyperlipidemia due to type 2 diabetes mellitus (HCC)   4. Tobacco abuse   5. OSA on CPAP    1. CAD in native artery Denies any anginal or exertional symptoms.  Continue aspirin 81 mg daily.  Continue Toprol-XL 50 mg p.o. daily Previous coronary CT 11/15/2016 with CAC score of 95.  This was 24 percentile for age and sex matched control.  Moderate nonobstructive CAD in LAD, mild in RCA and LCx arteries.  Aggressive risk factor modification was recommended.  2. Essential hypertension, benign BP 98/60 today.  Continue lisinopril 2.5 mg p.o. daily.  Continue Toprol-XL 50 mg p.o. daily.  Patient states his baseline blood pressure is low on a normal basis.  3. Mixed  hyperlipidemia due to type 2 diabetes mellitus (HCC) Continue atorvastatin 40 mg p.o. daily.  Diabetes not well controlled.  Recent random glucose 10 days ago on 01/10/2021 was 294.   4. Tobacco abuse Long history of smoking and continues to smoke.  Has no intentions to stop.  States he smoked a cigarette on the way to clinic today.  5. OSA on CPAP Compliant with CPAP  6.  Anemia /status post GI bleed He denies any current blood in stool or urine.  Denies any hematemesis.  Recent EGD showed no source of etiology for recent GI bleed.  He has an upcoming colonoscopy with [facility].  Recent H&H on 03/04/2021 was 9.9 and 28.7 at [facility].  Medication Adjustments/Labs and Tests Ordered: Current medicines are reviewed at length with the patient today.  Concerns regarding medicines are outlined above.   Disposition: Follow-up with Dr. Antoine Poche or APP 10-month  Signed, Rennis Harding, NP 03/23/2021 4:24 PM    [facility] at [facility] [address] [place], [place], [place] [address] Phone: [phone]; Fax: [phone]

## 2021-03-23 ENCOUNTER — Other Ambulatory Visit: Payer: Self-pay

## 2021-03-23 ENCOUNTER — Ambulatory Visit: Payer: Medicare Other | Admitting: Family Medicine

## 2021-03-23 ENCOUNTER — Encounter: Payer: Self-pay | Admitting: Family Medicine

## 2021-03-23 VITALS — BP 98/60 | HR 104 | Ht 68.0 in | Wt 202.2 lb

## 2021-03-23 DIAGNOSIS — I10 Essential (primary) hypertension: Secondary | ICD-10-CM | POA: Diagnosis not present

## 2021-03-23 DIAGNOSIS — I2510 Atherosclerotic heart disease of native coronary artery without angina pectoris: Secondary | ICD-10-CM | POA: Diagnosis not present

## 2021-03-23 DIAGNOSIS — E782 Mixed hyperlipidemia: Secondary | ICD-10-CM

## 2021-03-23 DIAGNOSIS — D649 Anemia, unspecified: Secondary | ICD-10-CM

## 2021-03-23 DIAGNOSIS — Z720 Tobacco use: Secondary | ICD-10-CM | POA: Diagnosis not present

## 2021-03-23 DIAGNOSIS — E1169 Type 2 diabetes mellitus with other specified complication: Secondary | ICD-10-CM | POA: Diagnosis not present

## 2021-03-23 DIAGNOSIS — G4733 Obstructive sleep apnea (adult) (pediatric): Secondary | ICD-10-CM

## 2021-03-23 DIAGNOSIS — Z9989 Dependence on other enabling machines and devices: Secondary | ICD-10-CM

## 2021-03-23 NOTE — Patient Instructions (Signed)
Medication Instructions:  Your physician recommends that you continue on your current medications as directed. Please refer to the Current Medication list given to you today.  *If you need a refill on your cardiac medications before your next appointment, please call your pharmacy*   Lab Work: NONE If you have labs (blood work) drawn today and your tests are completely normal, you will receive your results only by: MyChart Message (if you have MyChart) OR A paper copy in the mail If you have any lab test that is abnormal or we need to change your treatment, we will call you to review the results.   Testing/Procedures: NONE   Follow-Up: At [facility], you and your health needs are our priority.  As part of our continuing mission to provide you with exceptional heart care, we have created designated Provider Care Teams.  These Care Teams include your primary Cardiologist (physician) and Advanced Practice Providers (APPs -  Physician Assistants and Nurse Practitioners) who all work together to provide you with the care you need, when you need it.  We recommend signing up for the patient portal called "MyChart".  Sign up information is provided on this After Visit Summary.  MyChart is used to connect with patients for Virtual Visits (Telemedicine).  Patients are able to view lab/test results, encounter notes, upcoming appointments, etc.  Non-urgent messages can be sent to your provider as well.   To learn more about what you can do with MyChart, go to [email or web address].    Your next appointment:   6 month(s)  The format for your next appointment:   In Person  Provider:   You may see James Hochrein, MD or the following Advanced Practice Provider on your designated Care Team:   Andy Quinn, NP  

## 2021-06-10 ENCOUNTER — Ambulatory Visit: Payer: Medicare Other | Admitting: Neurology

## 2021-07-23 ENCOUNTER — Ambulatory Visit (INDEPENDENT_AMBULATORY_CARE_PROVIDER_SITE_OTHER): Payer: Medicare Other | Admitting: Gastroenterology

## 2021-08-19 ENCOUNTER — Ambulatory Visit (INDEPENDENT_AMBULATORY_CARE_PROVIDER_SITE_OTHER): Payer: Self-pay | Admitting: Neurology

## 2021-08-19 DIAGNOSIS — Z91199 Patient's noncompliance with other medical treatment and regimen due to unspecified reason: Secondary | ICD-10-CM

## 2021-08-19 NOTE — Progress Notes (Signed)
NO SHOW FOR APPOINTMENT. Pre-chart review below         NO SHOW FOR APPOINTMENT. Pre-chart review below  [facility]    Provider:  Dr Lucia Gaskins Referring Provider: Suzan Slick, MD Primary Care Physician:  Suzan Slick, MD  CC: Repeated Right-sided weakness with altered awareness  Interval history August 19, 2021, we have not seen patient in over 3 years for his seizure disorder, last seen December 07, 2017 by nurse practitioner, he does follow with our separate sleep team and is compliant with CPAP, per last note in June 2019 his last seizure occurred in June 2018, Keppra 1500 mg twice a day, history of stroke and is on aspirin for Lipitor for secondary stroke protection without further strokes.  Miguel Diaz continued aspirin and Lipitor for stroke prevention, asked him to follow closely with primary care for management of vascular risk factors, continue Keppra, he does not drive, seizure precautions, he was supposed to follow-up with [place] but did not.  He has however followed up with our separate sleep team and apparently is compliant.  Interval history 01/31/2017: Patient returns here for episodes of breakthrough seizure, last seen in the hospital in June 2018 when he was on the phone with his girlfriend when he had a sudden cessation of speech and she heard him shaking on the other side sounding like he was having a seizure. 911 EMS found him combative. On arrival he was moving all extremities but not following commands.   EEG was normal. He had increased lactate and white blood cells likely secondary to seizure. Keppra was increased to 1500 mg twice a day. He denies missing any doses or illness. He has since been on an increased dose of Keppra. He is doing well on the keppra. New problem is vertigo. Room spins. Happens with movement. Diagnosed with BPPV.  Ct showed No acute intracranial abnormalities including mass lesion or mass effect, hydrocephalus, extra-axial  fluid collection, midline shift, hemorrhage, or acute infarction, large ischemic events (personally reviewed images)   Interval History 05/14/2015:  He has been seeing Dr. Roda Shutters since June. Has had several more episodes and hospital admissions for altered consciousness. Keppra was adjusted. He has been on Keppra 1000mg  twice a day since August. No events since then. No side effects from the Keppra.   Interval History 01/02/2015: 2 weeks ago he was going out to the car and wife saw him laying in the yard. They called 911. He was brought to the emergency room. He was confused for 24 hours in the emergency room. He thought his sister was his ex-wife. Didn't know his mother's name. Didn't know the date. The kept him a few nights. He was changed from Depakote to Keppra. He went to the restroom and she heard a knock and she saw him against the wall. He turned face first into the restroom door. He couldn't respond or help. He was seen at the pcp office and his body was actively shaking. He was sent to the ED and was observed. He was confused afterwards for 4 hours. He wanted to sleep. His pcp increased his Keppra to 750 bid. Another EEG was performed on the 15th during his post-ictal period which was normal. Recommend long-term EEG to Dr. 16.    11/24/2013: 11/26/2013 Miguel Diaz. is a 51 y.o. male here as a referral from Dr. 46 for acute onset right-sided weakness. He follows with Dr. Cleda Clarks in the office however he is seeing me today as  Dr. Roda Shutters is not here and patient had another episode of right-sided weakness with altered awareness. PMH of DM, DM neuropathy, HLD, blindness was admitted or or had ER visits 7 times since 01/2014 for episodic LOC, unresponsive to verbal commands, confusion, nonverbal, right sided weakness/heavy feeling, headache, chest pain. Etiology has been unclear. 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 The possibility of complicated migraines has also been question and patient was placed on Topamax. However most recently patient had another episode and this time she was laying on the couch.    In April, patient was laying on the couch and he was not responding. He was just staring off. His girl friend is with him and provides most information. Eyes wide open. Looking straight in the direction of the TV, no foaming at the mouth, no abnormal movements. The paramedics came, staring spell lasted until the paramedics came, he would occ grunt. He closed his eyes in 5 minutes but no response except a grunt when someone would say his name. He told the paramedics that his right side was numb, no pain on the right side to noxious stimuli by the paramedics. They took him to the ED and by the time he got to the ED, there was slurred speech and facial droop. There was no headache associated with this episode. He is on topamax 50mg  bid for complicated migraines. He was confused afterwards but he ok when got to the hospital. Also c/o memory loss. In [place] there was reportedly an episode of GTCS after sitting on the couch with confusion afterwards.  Reviewed notes, labs and imaging from outside physicians, which showed:    EEG normal  [name] of the brain: Personally reviewed images:  Mild ventriculomegaly likely due to global atrophy  No mass lesions or mass effect, no acute ischemia or acute events.  Nonspecific chronic white matter changes likely small vessel ischemic disease which is advanced for age    Review of Systems:  Patient complains of symptoms per HPI as well as the following symptoms: Dizziness, headache, weakness, joint pain, walking difficulty, daytime sleepiness, snoring. Pertinent negatives per HPI. All others negative.     NO SHOWED  Assessment/Plan:  51 year old with epilepsy here with a breakthrough seizure.   PMH of DM, DM neuropathy, HLD, blindness was  admitted for had multiple ER visits and recent admission for episodic LOC, unresponsive to verbal commands, confusion, nonverbal, right sided weakness/heavy feeling, headache, chest pain. Etiology has been unclear.   Continue ASA and Lipitor for stroke prevention   Needs to follow closely with primary care for management of vascular risk factors.   Continue Keppra 1500 bid for seizure  He does not drive  Seizure precautions, discussed  BPPV: having episodes of vertigo on movement, diagnosed with BPPV. Takes meclizine. Discussed diagnosis, vestibular rehab for Epley Maneuvers and other treatment  - Discussed fall precautions   61, MD  [facility] [address] [place], [place]  Phone [phone] Fax [phone]  A total of 25  minutes was spent face-to-face with this patient. Over half this time was spent on counseling patient on the epilepsy, BPPV diagnosis and different diagnostic and therapeutic options available.

## 2021-08-24 ENCOUNTER — Encounter: Payer: Self-pay | Admitting: Neurology

## 2021-09-21 ENCOUNTER — Encounter (INDEPENDENT_AMBULATORY_CARE_PROVIDER_SITE_OTHER): Payer: Self-pay | Admitting: Gastroenterology

## 2021-09-21 ENCOUNTER — Ambulatory Visit (INDEPENDENT_AMBULATORY_CARE_PROVIDER_SITE_OTHER): Payer: Medicare Other | Admitting: Gastroenterology

## 2021-09-22 ENCOUNTER — Ambulatory Visit: Payer: Medicare Other | Admitting: Family Medicine

## 2021-09-22 ENCOUNTER — Encounter (INDEPENDENT_AMBULATORY_CARE_PROVIDER_SITE_OTHER): Payer: Self-pay | Admitting: *Deleted

## 2021-10-08 ENCOUNTER — Ambulatory Visit: Payer: Medicare Other | Admitting: Cardiology

## 2021-11-19 ENCOUNTER — Ambulatory Visit: Payer: Medicare Other | Admitting: Adult Health

## 2022-01-13 ENCOUNTER — Encounter: Payer: Self-pay | Admitting: *Deleted

## 2022-01-14 ENCOUNTER — Ambulatory Visit: Payer: Medicare Other | Admitting: Cardiology

## 2022-05-11 ENCOUNTER — Ambulatory Visit: Payer: Medicare Other | Admitting: Cardiology

## 2022-07-08 ENCOUNTER — Encounter: Payer: Self-pay | Admitting: *Deleted

## 2022-08-18 DIAGNOSIS — J069 Acute upper respiratory infection, unspecified: Secondary | ICD-10-CM | POA: Diagnosis not present

## 2022-08-23 DIAGNOSIS — Z20822 Contact with and (suspected) exposure to covid-19: Secondary | ICD-10-CM | POA: Diagnosis not present

## 2022-08-30 DIAGNOSIS — Z794 Long term (current) use of insulin: Secondary | ICD-10-CM | POA: Diagnosis not present

## 2022-08-30 DIAGNOSIS — E1165 Type 2 diabetes mellitus with hyperglycemia: Secondary | ICD-10-CM | POA: Diagnosis not present

## 2022-08-30 DIAGNOSIS — E119 Type 2 diabetes mellitus without complications: Secondary | ICD-10-CM | POA: Diagnosis not present

## 2022-08-30 DIAGNOSIS — Z1211 Encounter for screening for malignant neoplasm of colon: Secondary | ICD-10-CM | POA: Diagnosis not present

## 2022-10-03 DIAGNOSIS — G4733 Obstructive sleep apnea (adult) (pediatric): Secondary | ICD-10-CM | POA: Diagnosis not present

## 2022-12-29 DIAGNOSIS — Z794 Long term (current) use of insulin: Secondary | ICD-10-CM | POA: Diagnosis not present

## 2022-12-29 DIAGNOSIS — E1165 Type 2 diabetes mellitus with hyperglycemia: Secondary | ICD-10-CM | POA: Diagnosis not present

## 2023-01-01 DIAGNOSIS — G4733 Obstructive sleep apnea (adult) (pediatric): Secondary | ICD-10-CM | POA: Diagnosis not present

## 2023-01-01 DIAGNOSIS — Z885 Allergy status to narcotic agent status: Secondary | ICD-10-CM | POA: Diagnosis not present

## 2023-01-01 DIAGNOSIS — Z8673 Personal history of transient ischemic attack (TIA), and cerebral infarction without residual deficits: Secondary | ICD-10-CM | POA: Diagnosis not present

## 2023-01-01 DIAGNOSIS — R569 Unspecified convulsions: Secondary | ICD-10-CM | POA: Diagnosis not present

## 2023-01-01 DIAGNOSIS — Z880 Allergy status to penicillin: Secondary | ICD-10-CM | POA: Diagnosis not present

## 2023-01-01 DIAGNOSIS — I10 Essential (primary) hypertension: Secondary | ICD-10-CM | POA: Diagnosis not present

## 2023-01-01 DIAGNOSIS — E119 Type 2 diabetes mellitus without complications: Secondary | ICD-10-CM | POA: Diagnosis not present

## 2023-01-01 DIAGNOSIS — Z7984 Long term (current) use of oral hypoglycemic drugs: Secondary | ICD-10-CM | POA: Diagnosis not present

## 2023-01-01 DIAGNOSIS — R739 Hyperglycemia, unspecified: Secondary | ICD-10-CM | POA: Diagnosis not present

## 2023-01-01 DIAGNOSIS — E7800 Pure hypercholesterolemia, unspecified: Secondary | ICD-10-CM | POA: Diagnosis not present

## 2023-01-01 DIAGNOSIS — F17210 Nicotine dependence, cigarettes, uncomplicated: Secondary | ICD-10-CM | POA: Diagnosis not present

## 2023-01-01 DIAGNOSIS — Z743 Need for continuous supervision: Secondary | ICD-10-CM | POA: Diagnosis not present

## 2023-01-13 DIAGNOSIS — I2510 Atherosclerotic heart disease of native coronary artery without angina pectoris: Secondary | ICD-10-CM | POA: Diagnosis not present

## 2023-01-13 DIAGNOSIS — Z794 Long term (current) use of insulin: Secondary | ICD-10-CM | POA: Diagnosis not present

## 2023-01-13 DIAGNOSIS — E1165 Type 2 diabetes mellitus with hyperglycemia: Secondary | ICD-10-CM | POA: Diagnosis not present

## 2023-02-08 ENCOUNTER — Ambulatory Visit: Payer: Medicare Other | Attending: Nurse Practitioner | Admitting: Nurse Practitioner

## 2023-02-08 ENCOUNTER — Telehealth: Payer: Self-pay | Admitting: Nurse Practitioner

## 2023-02-08 ENCOUNTER — Encounter: Payer: Self-pay | Admitting: *Deleted

## 2023-02-08 ENCOUNTER — Encounter: Payer: Self-pay | Admitting: Nurse Practitioner

## 2023-02-08 VITALS — BP 122/72 | HR 76 | Ht 68.0 in | Wt 204.6 lb

## 2023-02-08 DIAGNOSIS — E118 Type 2 diabetes mellitus with unspecified complications: Secondary | ICD-10-CM

## 2023-02-08 DIAGNOSIS — I10 Essential (primary) hypertension: Secondary | ICD-10-CM

## 2023-02-08 DIAGNOSIS — Z720 Tobacco use: Secondary | ICD-10-CM

## 2023-02-08 DIAGNOSIS — R079 Chest pain, unspecified: Secondary | ICD-10-CM

## 2023-02-08 DIAGNOSIS — Z794 Long term (current) use of insulin: Secondary | ICD-10-CM

## 2023-02-08 DIAGNOSIS — E781 Pure hyperglyceridemia: Secondary | ICD-10-CM

## 2023-02-08 DIAGNOSIS — E1169 Type 2 diabetes mellitus with other specified complication: Secondary | ICD-10-CM | POA: Diagnosis not present

## 2023-02-08 DIAGNOSIS — E782 Mixed hyperlipidemia: Secondary | ICD-10-CM

## 2023-02-08 DIAGNOSIS — I2510 Atherosclerotic heart disease of native coronary artery without angina pectoris: Secondary | ICD-10-CM | POA: Diagnosis not present

## 2023-02-08 DIAGNOSIS — Z7984 Long term (current) use of oral hypoglycemic drugs: Secondary | ICD-10-CM

## 2023-02-08 DIAGNOSIS — I471 Supraventricular tachycardia, unspecified: Secondary | ICD-10-CM

## 2023-02-08 MED ORDER — FENOFIBRATE 145 MG PO TABS: 145.0000 mg | ORAL_TABLET | Freq: Every day | ORAL | 2 refills | Status: DC

## 2023-02-08 MED ORDER — NITROGLYCERIN 0.4 MG SL SUBL: 0.4000 mg | SUBLINGUAL_TABLET | SUBLINGUAL | 3 refills | Status: AC | PRN

## 2023-02-08 NOTE — Patient Instructions (Addendum)
Medication Instructions:  Your physician has recommended you make the following change in your medication:  Start fenofibrate 145 mg daily Start nitroglycerin 0.4 mg. Dissolve one under tongue for chest pain every 5 minutes up to 3 doses. If no relief, proceed to ED. Continue all other medications as prescribed  Labwork: none  Testing/Procedures: Your physician has requested that you have an exercise tolerance test. For further information please visit [email or web address] Please also follow instruction sheet, as given.  Follow-Up: Your physician recommends that you schedule a follow-up appointment in: 8 weeks  Any Other Special Instructions Will Be Listed Below (If Applicable). Smoking Cessation information provided  If you need a refill on your cardiac medications before your next appointment, please call your pharmacy.    Your cardiac CT will be scheduled at one of the below locations:   [facility] [address] [place], [place] [address] [phone]  If scheduled at [facility], please arrive at the [facility] and Children's Entrance (Entrance C2) of [facility] 30 minutes prior to test start time. You can use the FREE valet parking offered at entrance C (encouraged to control the heart rate for the test)  Proceed to the [facility] Radiology Department (first floor) to check-in and test prep.  All radiology patients and guests should use entrance C2 at [facility], accessed from [facility], even though the hospital's physical address listed is [address]    Please follow these instructions carefully (unless otherwise directed):  An IV will be required for this test and Nitroglycerin will be given.  Hold all erectile dysfunction medications at least 3 days (72 hrs) prior to test. (Ie viagra, cialis, sildenafil, tadalafil, etc)   On the Night Before the Test: Be sure to Drink plenty of water. Do not consume  any caffeinated/decaffeinated beverages or chocolate 12 hours prior to your test. Do not take any antihistamines 12 hours prior to your test. If the patient has contrast allergy: No contrast allergy  On the Day of the Test: Drink plenty of water until 1 hour prior to the test. Do not eat any food 1 hour prior to test. You may take your regular medications prior to the test.  Take metoprolol (Lopressor) 100 mg two hours prior to test.  After the Test: Drink plenty of water. After receiving IV contrast, you may experience a mild flushed feeling. This is normal. On occasion, you may experience a mild rash up to 24 hours after the test. This is not dangerous. If this occurs, you can take Benadryl 25 mg and increase your fluid intake. If you experience trouble breathing, this can be serious. If it is severe call 911 IMMEDIATELY. If it is mild, please call our office. If you take Metformin, please do not take 48 hours after completing test unless otherwise instructed.  We will call to schedule your test 2-4 weeks out understanding that some insurance companies will need an authorization prior to the service being performed.   For more information and frequently asked questions, please visit our website : [email or web address]  For non-scheduling related questions, please contact the cardiac imaging nurse navigator should you have any questions/concerns: Cardiac Imaging Nurse Navigators Direct Office Dial: [phone]   For scheduling needs, including cancellations and rescheduling, please call [place], [phone].

## 2023-02-08 NOTE — Progress Notes (Addendum)
Cardiology Office Note:  .   Date:  02/08/2023 ID:  Miguel Diaz., DOB Feb 07, 1971, MRN [id number] PCP: Flossie Buffy, MD  [facility] Providers Cardiologist:  Previous patient of Dr. Lenice Llamas to update primary MD,subspecialty MD or APP then REFRESH:1}   History of Present Illness: Miguel Diaz. is a 52 y.o. male with a PMH of CAD, HTN, T2DM, HLD, PSVT, Hx of CVA/TIA, hx of seizures, hx of blindness, OSA on CPAP, hx of SVCS, tobacco abuse, hx of GI bleed, and orthostatic hypotension, who presents today for follow-up.   CCTA in 2018 revealed coronary calcium score 75 with moderate nonobstructive CAD in LAD, mild in RCA and left circumflex arteries.,  Aortic atherosclerosis also noted, 3 mm left upper lobe pulmonary nodule that was unchanged from prior study in 2015 that was considered benign also noted.  Previous patient of Dr. Purvis Sheffield. Last seen by Rennis Harding, NP on March 23, 2021. BP was soft/a little low, but pt stated was normal for him and was asymptomatic, was overall doing well from a cardiac perspective.   Today he presents for overdue follow-up. He admits to intermittent chronic chest pain, that is left sided, described as dull in nature. Not typically associated with exertion, can last for several hours. Denies any alleviating or aggravating factors. Wonders if it is related to indigestion as sometimes protonix, pepcid helps. 3/10 on pain scale. Stable over time. Denies any shortness of breath, palpitations, syncope, presyncope, dizziness, orthopnea, PND, swelling or significant weight changes, acute bleeding, or claudication.  Weaning his tobacco use.   Studies Reviewed: Marland Kitchen    EKG: EKG Interpretation Date/Time:  Tuesday February 08 2023 14:24:44 EDT Ventricular Rate:  76 PR Interval:  130 QRS Duration:  76 QT Interval:  404 QTC Calculation: 454 R Axis:   -3  Text Interpretation: Normal sinus rhythm Nonspecific T wave  abnormality When compared with ECG of 24-Dec-2016 13:50, PREVIOUS ECG IS PRESENT Confirmed by Sharlene Dory [phone]) on 02/08/2023 2:30:55 PM   CCTA 11/2016: IMPRESSION: 1. Coronary calcium score of 75. This was 18 percentile for age and sex matched control.   2. Normal coronary origin. Right dominance. Limited study quality sec to suboptimal vessel dilatation.   3. 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 An aggressive risk factor modification is recommended.  4. Aortic atherosclerosis  5. Pulmonary nodule along left upper lobe  Echo 11/2016: Study Conclusions   - Left ventricle: The cavity size was normal. Wall thickness was    normal. Systolic function was normal. The estimated ejection    fraction was in the range of 50% to 55%. Wall motion was normal;    there were no regional wall motion abnormalities. Left    ventricular diastolic function parameters were normal.  - Aortic valve: Mildly thickened, mildly calcified leaflets. There    was trivial regurgitation.  - Mitral valve: Calcified annulus.  - Inferior vena cava: The vessel was normal in size. The    respirophasic diameter changes were in the normal range (= 50%),    consistent with normal central venous pressure.  Physical Exam:   VS:  BP 122/72   Pulse 76   Ht 5\' 8"  (1.727 m)   Wt 204 lb 9.6 oz (92.8 kg)   SpO2 96%   BMI 31.11 kg/m    Wt Readings from Last 3 Encounters:  02/08/23 204 lb 9.6 oz (92.8 kg)  03/23/21 202 lb  3.2 oz (91.7 kg)  11/13/20 202 lb (91.6 kg)    GEN: Obese, 52 y.o. male in no acute distress NECK: No JVD; No carotid bruits CARDIAC: S1/S2, RRR, no murmurs, rubs, gallops RESPIRATORY:  Clear to auscultation without rales, wheezing or rhonchi  ABDOMEN: Soft, non-tender, non-distended EXTREMITIES:  No edema; No deformity   ASSESSMENT AND PLAN: .    CAD, chest pain of uncertain etiology CCTA revealed coronary artery calcium score of 75 with moderate,  non-obstructive CAD in LAD, mild in RCA and LCX arteries. Admits to chronic, intermittent chest pain, says sometimes GI medication helps, however d/t risk factors including tobacco abuse, HLD, HTN, and last ischemic evaluation, discussed ETT what test involves and discussed including risks and benefits and he verbalized understanding and is agreeable to proceed. States he will be able to complete test on treadmill. Will refill NTG. Heart healthy diet and regular cardiovascular exercise encouraged. ED precautions discussed.  Addendum: Called and s/w patient regarding ETT, and reviewed visual handicap - see telephone note dated 02/11/2023. 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 Will cancel ETT and arrange CCTA, arrange BMET 1 week prior to test and will instruct patient to take Metoprolol tartrate 100 mg once 2 hours prior to test.   HTN BP stable. Continue current medication regimen. Discussed to monitor BP at home at least 2 hours after medications and sitting for 5-10 minutes. Heart healthy diet and regular cardiovascular exercise encouraged.   HLD, hypertriglyceridemia Lipid panel 08/2022 revealed LDL 80, triglycerides at 315. Continue atorvastatin. Will initiate fenofibrate 145 mg daily. Will plan to repeat FLP and LFT at next office visit. Heart healthy diet and regular cardiovascular exercise encouraged.   T2DM Most recent A1C 8.2%.  Continue current medication regimen. Continue to follow with PCP as this is being managed by PCP.  Diabetic, heart healthy diet and regular cardiovascular exercise encouraged.   PSVT Denies any recent tachycardia or palpitations. Continue Toprol XL.  Heart healthy diet and regular cardiovascular exercise encouraged.   Tobacco abuse Smoking cessation encouraged and discussed.  Dispo: Follow-up with me or APP in 8 weeks or sooner if anything changes.   Signed, Sharlene Dory, NP

## 2023-02-08 NOTE — Telephone Encounter (Signed)
Checking percert on the following patient for testing scheduled at [facility].    ETT   02/15/2023

## 2023-02-11 ENCOUNTER — Telehealth: Payer: Self-pay | Admitting: Nurse Practitioner

## 2023-02-11 NOTE — Telephone Encounter (Signed)
Called and s/w patient and clarified and went over ETT. He explained his visual impairments to me and came to shared medical decision not to pursue ETT, even though patient told me in office earlier this week that he could perform the treadmill test. Went over/reviewed CCTA and pt verbalized understanding and is agreeable to proceed. Will obtain BMET in 1 week prior to testing and will write Rx for Lopressor 100 mg once 2 hours prior to test.   Pt verbalized understanding of our conversation and was appreciative of my call.   Sharlene Dory, NP

## 2023-02-15 ENCOUNTER — Encounter: Payer: Self-pay | Admitting: *Deleted

## 2023-02-15 ENCOUNTER — Telehealth: Payer: Self-pay | Admitting: *Deleted

## 2023-02-15 ENCOUNTER — Ambulatory Visit (HOSPITAL_COMMUNITY): Payer: Medicare Other

## 2023-02-15 MED ORDER — METOPROLOL TARTRATE 100 MG PO TABS
100.0000 mg | ORAL_TABLET | Freq: Once | ORAL | 0 refills | Status: AC
Start: 2023-02-17 — End: 2024-05-30

## 2023-02-15 NOTE — Addendum Note (Signed)
Addended by: Eustace Moore on: 02/15/2023 02:35 PM   Modules accepted: Orders

## 2023-02-15 NOTE — Telephone Encounter (Signed)
Clarification for metoprolol regimen morning of CT sent to [facility] "Hey-I just want to confirm that you want him to take lopressor 100 mg 2 hours before in addition to his toprol xl 50 mg that morning?"  Per Sharlene Dory, NP "Randie Heinz question! Thanks for confirming. No he will hold his regular Toprol XL that day. He can resume this the next day"  Lab order faxed to [facility]

## 2023-02-18 ENCOUNTER — Encounter (HOSPITAL_COMMUNITY): Payer: Self-pay

## 2023-02-22 ENCOUNTER — Other Ambulatory Visit (HOSPITAL_COMMUNITY)
Admission: RE | Admit: 2023-02-22 | Discharge: 2023-02-22 | Disposition: A | Discharge status: Home / Self Care | Payer: 59 | Source: Ambulatory Visit | Attending: Nurse Practitioner | Admitting: Nurse Practitioner

## 2023-02-22 ENCOUNTER — Telehealth: Payer: Self-pay | Admitting: Nurse Practitioner

## 2023-02-22 ENCOUNTER — Telehealth (HOSPITAL_COMMUNITY): Payer: Self-pay | Admitting: *Deleted

## 2023-02-22 DIAGNOSIS — R079 Chest pain, unspecified: Secondary | ICD-10-CM

## 2023-02-22 LAB — BASIC METABOLIC PANEL
Anion gap: 7 (ref 5–15)
BUN: 6 mg/dL (ref 6–20)
CO2: 28 mmol/L (ref 22–32)
Calcium: 8.6 mg/dL — ABNORMAL LOW (ref 8.9–10.3)
Chloride: 101 mmol/L (ref 98–111)
Creatinine, Ser: 0.73 mg/dL (ref 0.61–1.24)
GFR, Estimated: >60 mL/min (ref >60)
Glucose, Bld: 189 mg/dL — ABNORMAL HIGH (ref 70–99)
Potassium: 3.7 mmol/L (ref 3.5–5.1)
Sodium: 136 mmol/L (ref 135–145)

## 2023-02-22 NOTE — Telephone Encounter (Signed)
Patient would like to know if he needs to have his labs drawn before his CT Scan tomorrow. Patient is scheduled for an 8 week follow up with Sharlene Dory and would like to know if needs to be seen before then. Please advise.

## 2023-02-22 NOTE — Telephone Encounter (Signed)
Attempted to call patient regarding upcoming cardiac CT appointment. Left message on voicemail with name and callback number Hayley Sharpe RN Navigator Cardiac Imaging [facility] [phone] Office   

## 2023-02-22 NOTE — Telephone Encounter (Signed)
Advised pt that lab work needs to be completed prior to CT scan on 02/23/23. Advised pt he can go to [facility] to have labs drawn. Pt agreeable. Pt had no further questions or concerns at this time.   Order for BMET entered for [facility]

## 2023-02-23 ENCOUNTER — Ambulatory Visit (HOSPITAL_COMMUNITY): Admission: RE | Admit: 2023-02-23 | Payer: 59 | Source: Ambulatory Visit

## 2023-02-24 ENCOUNTER — Telehealth: Payer: Self-pay

## 2023-02-24 NOTE — Telephone Encounter (Signed)
-----   Message from Sharlene Dory sent at 02/23/2023  4:29 PM EDT ----- Labs look good.   Sharlene Dory, AGNP-C

## 2023-02-24 NOTE — Telephone Encounter (Signed)
Patient informed and verbalized understanding. Copy sent to PCP 

## 2023-03-15 ENCOUNTER — Telehealth (HOSPITAL_COMMUNITY): Payer: Self-pay | Admitting: *Deleted

## 2023-03-15 NOTE — Telephone Encounter (Signed)
Reaching out to patient to offer assistance regarding upcoming cardiac imaging study; pt verbalizes understanding of appt date/time, parking situation and where to check in, pre-test NPO status and medications ordered, and verified current allergies; name and call back number provided for further questions should they arise Hayley Sharpe RN Navigator Cardiac Imaging [facility] [phone] office [phone] cell  

## 2023-03-16 ENCOUNTER — Ambulatory Visit (HOSPITAL_COMMUNITY): Admission: RE | Admit: 2023-03-16 | Payer: 59 | Source: Ambulatory Visit

## 2023-04-04 ENCOUNTER — Telehealth: Payer: Self-pay | Admitting: Nurse Practitioner

## 2023-04-04 NOTE — Telephone Encounter (Signed)
Patient is calling with questions concerning CT. Please advise

## 2023-04-04 NOTE — Telephone Encounter (Signed)
Patient verbalized understanding of instructions for his CT scan he also would like to reschedule his appointment for after his CT scan is done 10/3

## 2023-04-05 ENCOUNTER — Ambulatory Visit: Payer: Medicare Other | Admitting: Nurse Practitioner

## 2023-04-06 ENCOUNTER — Telehealth (HOSPITAL_COMMUNITY): Payer: Self-pay | Admitting: *Deleted

## 2023-04-06 NOTE — Telephone Encounter (Signed)
Reaching out to patient to offer assistance regarding upcoming cardiac imaging study; wife verbalizes understanding of appt date/time, parking situation and where to check in, pre-test NPO status and medications ordered, and verified current allergies; name and call back number provided for further questions should they arise Johney Frame RN Navigator Cardiac Imaging [facility] [phone] office [phone] cell

## 2023-04-07 ENCOUNTER — Ambulatory Visit (HOSPITAL_COMMUNITY)
Admission: RE | Admit: 2023-04-07 | Discharge: 2023-04-07 | Disposition: A | Discharge status: Home / Self Care | Payer: 59 | Source: Ambulatory Visit | Attending: Nurse Practitioner | Admitting: Nurse Practitioner

## 2023-04-07 DIAGNOSIS — I2510 Atherosclerotic heart disease of native coronary artery without angina pectoris: Secondary | ICD-10-CM | POA: Diagnosis not present

## 2023-04-07 DIAGNOSIS — I700 Atherosclerosis of aorta: Secondary | ICD-10-CM | POA: Insufficient documentation

## 2023-04-07 DIAGNOSIS — R079 Chest pain, unspecified: Secondary | ICD-10-CM | POA: Insufficient documentation

## 2023-04-07 LAB — POCT I-STAT CREATININE: Creatinine, Ser: 0.9 mg/dL (ref 0.61–1.24)

## 2023-04-07 MED ORDER — NITROGLYCERIN 0.4 MG SL SUBL
0.8000 mg | SUBLINGUAL_TABLET | Freq: Once | SUBLINGUAL | Status: AC
  Administered 2023-04-07: 0.8 mg via SUBLINGUAL

## 2023-04-07 MED ORDER — METOPROLOL TARTRATE 5 MG/5ML IV SOLN
INTRAVENOUS | Status: AC
  Filled 2023-04-07: qty 10

## 2023-04-07 MED ORDER — IOHEXOL 350 MG/ML SOLN
180.0000 mL | Freq: Once | INTRAVENOUS | Status: AC | PRN
  Administered 2023-04-07: 180 mL via INTRAVENOUS

## 2023-04-07 MED ORDER — NITROGLYCERIN 0.4 MG SL SUBL
SUBLINGUAL_TABLET | SUBLINGUAL | Status: AC
  Filled 2023-04-07: qty 2

## 2023-05-13 ENCOUNTER — Ambulatory Visit: Payer: 59 | Admitting: Nurse Practitioner

## 2023-05-31 ENCOUNTER — Encounter: Payer: Self-pay | Admitting: Nurse Practitioner

## 2023-05-31 ENCOUNTER — Ambulatory Visit: Payer: 59 | Attending: Nurse Practitioner | Admitting: Nurse Practitioner

## 2023-05-31 VITALS — BP 114/80 | HR 84 | Ht 68.0 in | Wt 200.8 lb

## 2023-05-31 DIAGNOSIS — E1143 Type 2 diabetes mellitus with diabetic autonomic (poly)neuropathy: Secondary | ICD-10-CM

## 2023-05-31 DIAGNOSIS — Z720 Tobacco use: Secondary | ICD-10-CM

## 2023-05-31 DIAGNOSIS — Z79899 Other long term (current) drug therapy: Secondary | ICD-10-CM

## 2023-05-31 DIAGNOSIS — E1142 Type 2 diabetes mellitus with diabetic polyneuropathy: Secondary | ICD-10-CM

## 2023-05-31 DIAGNOSIS — I2510 Atherosclerotic heart disease of native coronary artery without angina pectoris: Secondary | ICD-10-CM

## 2023-05-31 DIAGNOSIS — E785 Hyperlipidemia, unspecified: Secondary | ICD-10-CM

## 2023-05-31 DIAGNOSIS — E781 Pure hyperglyceridemia: Secondary | ICD-10-CM

## 2023-05-31 DIAGNOSIS — I10 Essential (primary) hypertension: Secondary | ICD-10-CM | POA: Diagnosis not present

## 2023-05-31 DIAGNOSIS — E1169 Type 2 diabetes mellitus with other specified complication: Secondary | ICD-10-CM

## 2023-05-31 DIAGNOSIS — I471 Supraventricular tachycardia, unspecified: Secondary | ICD-10-CM

## 2023-05-31 NOTE — Progress Notes (Unsigned)
Cardiology Office Note:  .   Date:  02/08/2023 ID:  Miguel Diaz., DOB 1970-10-02, MRN [id number] PCP: Flossie Buffy, MD  [facility] Providers Cardiologist:  Previous patient of Dr. Lenice Llamas to update primary MD,subspecialty MD or APP then REFRESH:1}   History of Present Illness: Miguel Diaz. is a 52 y.o. male with a PMH of CAD, HTN, T2DM, HLD, PSVT, Hx of CVA/TIA, hx of seizures, hx of blindness, OSA on CPAP, hx of SVCS, tobacco abuse, hx of GI bleed, and orthostatic hypotension, who presents today for follow-up.   CCTA in 2018 revealed coronary calcium score 75 with moderate nonobstructive CAD in LAD, mild in RCA and left circumflex arteries.,  Aortic atherosclerosis also noted, 3 mm left upper lobe pulmonary nodule that was unchanged from prior study in 2015 that was considered benign also noted.  Previous patient of Dr. Purvis Sheffield. Last seen by Rennis Harding, NP on March 23, 2021. BP was soft/a little low, but pt stated was normal for him and was asymptomatic, was overall doing well from a cardiac perspective.   Today he presents for overdue follow-up. He admits to intermittent chronic chest pain, that is left sided, described as dull in nature. Not typically associated with exertion, can last for several hours. Denies any alleviating or aggravating factors. Wonders if it is related to indigestion as sometimes protonix, pepcid helps. 3/10 on pain scale. Stable over time. Denies any shortness of breath, palpitations, syncope, presyncope, dizziness, orthopnea, PND, swelling or significant weight changes, acute bleeding, or claudication.  Weaning his tobacco use.   Studies Reviewed: Marland Kitchen    EKG:     CCTA 11/2016: IMPRESSION: 1. Coronary calcium score of 75. This was 78 percentile for age and sex matched control.   2. Normal coronary origin. Right dominance. Limited study quality sec to suboptimal vessel dilatation.   3. Moderate  nonobstructive CAD in LAD, mild in RCA and LCX arteries. An aggressive risk factor modification is recommended.  4. Aortic atherosclerosis  5. Pulmonary nodule along left upper lobe  Echo 11/2016: Study Conclusions   - Left ventricle: The cavity size was normal. Wall thickness was    normal. Systolic function was normal. The estimated ejection    fraction was in the range of 50% to 55%. Wall motion was normal;    there were no regional wall motion abnormalities. Left    ventricular diastolic function parameters were normal.  - Aortic valve: Mildly thickened, mildly calcified leaflets. There    was trivial regurgitation.  - Mitral valve: Calcified annulus.  - Inferior vena cava: The vessel was normal in size. The    respirophasic diameter changes were in the normal range (= 50%),    consistent with normal central venous pressure.  Physical Exam:   VS:  There were no vitals taken for this visit.   Wt Readings from Last 3 Encounters:  02/08/23 204 lb 9.6 oz (92.8 kg)  03/23/21 202 lb 3.2 oz (91.7 kg)  11/13/20 202 lb (91.6 kg)    GEN: Obese, 52 y.o. male in no acute distress NECK: No JVD; No carotid bruits CARDIAC: S1/S2, RRR, no murmurs, rubs, gallops RESPIRATORY:  Clear to auscultation without rales, wheezing or rhonchi  ABDOMEN: Soft, non-tender, non-distended EXTREMITIES:  No edema; No deformity   ASSESSMENT AND PLAN: .    CAD, chest pain of uncertain etiology CCTA revealed coronary artery calcium score of 75 with moderate, non-obstructive CAD in LAD, mild  in RCA and LCX arteries. Admits to chronic, intermittent chest pain, says sometimes GI medication helps, however d/t risk factors including tobacco abuse, HLD, HTN, and last ischemic evaluation, discussed ETT what test involves and discussed including risks and benefits and he verbalized understanding and is agreeable to proceed. States he will be able to complete test on treadmill. Will refill NTG. Heart healthy diet and  regular cardiovascular exercise encouraged. ED precautions discussed.  Addendum: Called and s/w patient regarding ETT, and reviewed visual handicap - see telephone note dated 02/11/2023. 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 Will cancel ETT and arrange CCTA, arrange BMET 1 week prior to test and will instruct patient to take Metoprolol tartrate 100 mg once 2 hours prior to test.   HTN BP stable. Continue current medication regimen. Discussed to monitor BP at home at least 2 hours after medications and sitting for 5-10 minutes. Heart healthy diet and regular cardiovascular exercise encouraged.   HLD, hypertriglyceridemia Lipid panel 08/2022 revealed LDL 80, triglycerides at 315. Continue atorvastatin. Will initiate fenofibrate 145 mg daily. Will plan to repeat FLP and LFT at next office visit. Heart healthy diet and regular cardiovascular exercise encouraged.   T2DM Most recent A1C 8.2%.  Continue current medication regimen. Continue to follow with PCP as this is being managed by PCP.  Diabetic, heart healthy diet and regular cardiovascular exercise encouraged.   PSVT Denies any recent tachycardia or palpitations. Continue Toprol XL.  Heart healthy diet and regular cardiovascular exercise encouraged.   Tobacco abuse Smoking cessation encouraged and discussed.  Dispo: Follow-up with me or APP in 8 weeks or sooner if anything changes.   Signed, Sharlene Dory, NP

## 2023-05-31 NOTE — Patient Instructions (Addendum)
Medication Instructions:  Your physician recommends that you continue on your current medications as directed. Please refer to the Current Medication list given to you today.  Labwork: 1-2 weeks at [facility]   Testing/Procedures: None   Follow-Up: Your physician recommends that you schedule a follow-up appointment in: 6 months   Any Other Special Instructions Will Be Listed Below (If Applicable).  If you need a refill on your cardiac medications before your next appointment, please call your pharmacy.

## 2023-06-07 ENCOUNTER — Other Ambulatory Visit (HOSPITAL_COMMUNITY)
Admission: RE | Admit: 2023-06-07 | Discharge: 2023-06-07 | Disposition: A | Discharge status: Home / Self Care | Payer: 59 | Source: Ambulatory Visit | Attending: Nurse Practitioner | Admitting: Nurse Practitioner

## 2023-06-07 DIAGNOSIS — Z79899 Other long term (current) drug therapy: Secondary | ICD-10-CM | POA: Diagnosis present

## 2023-06-07 DIAGNOSIS — E785 Hyperlipidemia, unspecified: Secondary | ICD-10-CM

## 2023-06-07 DIAGNOSIS — E781 Pure hyperglyceridemia: Secondary | ICD-10-CM

## 2023-06-07 DIAGNOSIS — I471 Supraventricular tachycardia, unspecified: Secondary | ICD-10-CM | POA: Insufficient documentation

## 2023-06-07 LAB — HEPATIC FUNCTION PANEL
ALT: 27 U/L (ref 0–44)
AST: 25 U/L (ref 15–41)
Albumin: 3.8 g/dL (ref 3.5–5.0)
Alkaline Phosphatase: 88 U/L (ref 38–126)
Bilirubin, Direct: 0.1 mg/dL (ref 0.0–0.2)
Indirect Bilirubin: 0.5 mg/dL (ref 0.3–0.9)
Total Bilirubin: 0.6 mg/dL (ref <1.2)
Total Protein: 6.7 g/dL (ref 6.5–8.1)

## 2023-06-07 LAB — LIPID PANEL
Cholesterol: 160 mg/dL (ref 0–200)
HDL: 30 mg/dL — ABNORMAL LOW (ref >40)
LDL Cholesterol: 99 mg/dL (ref 0–99)
Total CHOL/HDL Ratio: 5.3 {ratio}
Triglycerides: 155 mg/dL — ABNORMAL HIGH (ref <150)
VLDL: 31 mg/dL (ref 0–40)

## 2023-09-08 ENCOUNTER — Other Ambulatory Visit: Payer: Self-pay | Admitting: Nurse Practitioner

## 2023-12-05 ENCOUNTER — Ambulatory Visit: Payer: 59 | Attending: Internal Medicine | Admitting: Internal Medicine

## 2023-12-05 NOTE — Progress Notes (Signed)
 Erroneous encounter - please disregard.
